# Patient Record
Sex: Male | Born: 1935 | Race: White | Hispanic: No | Marital: Married | Smoking: Never smoker
Health system: Southern US, Community
[De-identification: ages and names within clinical notes are randomized; demographics above are authoritative.]

## PROBLEM LIST (undated history)

## (undated) DIAGNOSIS — E291 Testicular hypofunction: Secondary | ICD-10-CM

## (undated) DIAGNOSIS — M545 Low back pain, unspecified: Secondary | ICD-10-CM

## (undated) DIAGNOSIS — M5030 Other cervical disc degeneration, unspecified cervical region: Secondary | ICD-10-CM

## (undated) DIAGNOSIS — N319 Neuromuscular dysfunction of bladder, unspecified: Secondary | ICD-10-CM

## (undated) DIAGNOSIS — D649 Anemia, unspecified: Secondary | ICD-10-CM

## (undated) DIAGNOSIS — R3915 Urgency of urination: Secondary | ICD-10-CM

## (undated) DIAGNOSIS — M4850XA Collapsed vertebra, not elsewhere classified, site unspecified, initial encounter for fracture: Secondary | ICD-10-CM

## (undated) DIAGNOSIS — K219 Gastro-esophageal reflux disease without esophagitis: Secondary | ICD-10-CM

## (undated) DIAGNOSIS — M21332 Wrist drop, left wrist: Secondary | ICD-10-CM

## (undated) DIAGNOSIS — M159 Polyosteoarthritis, unspecified: Secondary | ICD-10-CM

## (undated) DIAGNOSIS — M51369 Other intervertebral disc degeneration, lumbar region without mention of lumbar back pain or lower extremity pain: Secondary | ICD-10-CM

## (undated) DIAGNOSIS — R569 Unspecified convulsions: Secondary | ICD-10-CM

## (undated) DIAGNOSIS — I878 Other specified disorders of veins: Secondary | ICD-10-CM

## (undated) DIAGNOSIS — R413 Other amnesia: Secondary | ICD-10-CM

## (undated) DIAGNOSIS — M4800 Spinal stenosis, site unspecified: Secondary | ICD-10-CM

## (undated) DIAGNOSIS — F32A Depression, unspecified: Secondary | ICD-10-CM

## (undated) DIAGNOSIS — M5136 Other intervertebral disc degeneration, lumbar region: Secondary | ICD-10-CM

## (undated) DIAGNOSIS — Z7409 Other reduced mobility: Secondary | ICD-10-CM

## (undated) DIAGNOSIS — F329 Major depressive disorder, single episode, unspecified: Secondary | ICD-10-CM

## (undated) DIAGNOSIS — H269 Unspecified cataract: Secondary | ICD-10-CM

## (undated) DIAGNOSIS — R109 Unspecified abdominal pain: Secondary | ICD-10-CM

## (undated) DIAGNOSIS — H539 Unspecified visual disturbance: Secondary | ICD-10-CM

## (undated) DIAGNOSIS — M810 Age-related osteoporosis without current pathological fracture: Secondary | ICD-10-CM

## (undated) DIAGNOSIS — G3184 Mild cognitive impairment, so stated: Secondary | ICD-10-CM

## (undated) DIAGNOSIS — Z789 Other specified health status: Secondary | ICD-10-CM

## (undated) DIAGNOSIS — G5630 Lesion of radial nerve, unspecified upper limb: Secondary | ICD-10-CM

## (undated) DIAGNOSIS — Z974 Presence of external hearing-aid: Secondary | ICD-10-CM

## (undated) DIAGNOSIS — M4310 Spondylolisthesis, site unspecified: Secondary | ICD-10-CM

## (undated) DIAGNOSIS — M419 Scoliosis, unspecified: Secondary | ICD-10-CM

## (undated) DIAGNOSIS — M21331 Wrist drop, right wrist: Secondary | ICD-10-CM

## (undated) DIAGNOSIS — G629 Polyneuropathy, unspecified: Secondary | ICD-10-CM

## (undated) DIAGNOSIS — I10 Essential (primary) hypertension: Secondary | ICD-10-CM

## (undated) DIAGNOSIS — N393 Stress incontinence (female) (male): Secondary | ICD-10-CM

## (undated) DIAGNOSIS — F09 Unspecified mental disorder due to known physiological condition: Secondary | ICD-10-CM

## (undated) DIAGNOSIS — Z9689 Presence of other specified functional implants: Secondary | ICD-10-CM

## (undated) DIAGNOSIS — K449 Diaphragmatic hernia without obstruction or gangrene: Secondary | ICD-10-CM

## (undated) DIAGNOSIS — M47812 Spondylosis without myelopathy or radiculopathy, cervical region: Secondary | ICD-10-CM

## (undated) DIAGNOSIS — Z993 Dependence on wheelchair: Secondary | ICD-10-CM

## (undated) DIAGNOSIS — M2030 Hallux varus (acquired), unspecified foot: Secondary | ICD-10-CM

## (undated) DIAGNOSIS — E538 Deficiency of other specified B group vitamins: Secondary | ICD-10-CM

## (undated) DIAGNOSIS — N400 Enlarged prostate without lower urinary tract symptoms: Secondary | ICD-10-CM

## (undated) DIAGNOSIS — E785 Hyperlipidemia, unspecified: Secondary | ICD-10-CM

## (undated) DIAGNOSIS — M47817 Spondylosis without myelopathy or radiculopathy, lumbosacral region: Secondary | ICD-10-CM

## (undated) DIAGNOSIS — R269 Unspecified abnormalities of gait and mobility: Secondary | ICD-10-CM

## (undated) DIAGNOSIS — I872 Venous insufficiency (chronic) (peripheral): Secondary | ICD-10-CM

## (undated) DIAGNOSIS — G4700 Insomnia, unspecified: Secondary | ICD-10-CM

## (undated) DIAGNOSIS — G4730 Sleep apnea, unspecified: Secondary | ICD-10-CM

## (undated) DIAGNOSIS — M1990 Unspecified osteoarthritis, unspecified site: Secondary | ICD-10-CM

## (undated) DIAGNOSIS — G4719 Other hypersomnia: Secondary | ICD-10-CM

## (undated) HISTORY — DX: Testicular hypofunction: E29.1

## (undated) HISTORY — DX: Unspecified osteoarthritis, unspecified site: M19.90

## (undated) HISTORY — DX: Urgency of urination: R39.15

## (undated) HISTORY — DX: Anemia, unspecified: D64.9

## (undated) HISTORY — PX: PAIN PUMP IMPLANTATION: SHX330

## (undated) HISTORY — DX: Other intervertebral disc degeneration, lumbar region without mention of lumbar back pain or lower extremity pain: M51.369

## (undated) HISTORY — PX: JOINT REPLACEMENT: SHX530

## (undated) HISTORY — DX: Other amnesia: R41.3

## (undated) HISTORY — DX: Other intervertebral disc degeneration, lumbar region: M51.36

## (undated) HISTORY — DX: Deficiency of other specified B group vitamins: E53.8

## (undated) HISTORY — DX: Polyneuropathy, unspecified: G62.9

## (undated) HISTORY — DX: Unspecified cataract: H26.9

## (undated) HISTORY — DX: Presence of external hearing-aid: Z97.4

## (undated) HISTORY — DX: Insomnia, unspecified: G47.00

## (undated) HISTORY — DX: Spinal stenosis, site unspecified: M48.00

## (undated) HISTORY — DX: Lesion of radial nerve, unspecified upper limb: G56.30

## (undated) HISTORY — DX: Major depressive disorder, single episode, unspecified: F32.9

## (undated) HISTORY — DX: Other reduced mobility: Z74.09

## (undated) HISTORY — DX: Benign prostatic hyperplasia without lower urinary tract symptoms: N40.0

## (undated) HISTORY — PX: CERVICAL SPINE SURGERY: SHX589

## (undated) HISTORY — DX: Other specified health status: Z78.9

## (undated) HISTORY — DX: Diaphragmatic hernia without obstruction or gangrene: K44.9

## (undated) HISTORY — DX: Other hypersomnia: G47.19

## (undated) HISTORY — DX: Age-related osteoporosis without current pathological fracture: M81.0

## (undated) HISTORY — DX: Unspecified abdominal pain: R10.9

## (undated) HISTORY — DX: Unspecified mental disorder due to known physiological condition: F09

## (undated) HISTORY — PX: LUMBAR SPINE SURGERY: SHX701

## (undated) HISTORY — DX: Other cervical disc degeneration, unspecified cervical region: M50.30

## (undated) HISTORY — DX: Hallux varus (acquired), unspecified foot: M20.30

## (undated) HISTORY — PX: ROTATOR CUFF REPAIR: SHX139

## (undated) HISTORY — DX: Polyosteoarthritis, unspecified: M15.9

## (undated) HISTORY — DX: Sleep apnea, unspecified: G47.30

## (undated) HISTORY — DX: Scoliosis, unspecified: M41.9

## (undated) HISTORY — DX: Spondylolisthesis, site unspecified: M43.10

## (undated) HISTORY — DX: Other specified disorders of veins: I87.8

## (undated) HISTORY — DX: Essential (primary) hypertension: I10

## (undated) HISTORY — DX: Spondylosis without myelopathy or radiculopathy, cervical region: M47.812

## (undated) HISTORY — PX: THORACIC SPINE SURGERY: SHX802

## (undated) HISTORY — DX: Unspecified abnormalities of gait and mobility: R26.9

## (undated) HISTORY — DX: Low back pain, unspecified: M54.50

## (undated) HISTORY — PX: SPINAL CORD STIMULATOR IMPLANT: SHX2422

## (undated) HISTORY — DX: Gastro-esophageal reflux disease without esophagitis: K21.9

## (undated) HISTORY — DX: Neuromuscular dysfunction of bladder, unspecified: N31.9

## (undated) HISTORY — DX: Collapsed vertebra, not elsewhere classified, site unspecified, initial encounter for fracture: M48.50XA

## (undated) HISTORY — DX: Unspecified visual disturbance: H53.9

## (undated) HISTORY — DX: Low back pain: M54.5

## (undated) HISTORY — DX: Venous insufficiency (chronic) (peripheral): I87.2

## (undated) HISTORY — PX: HEMORRHOID SURGERY: SHX153

## (undated) HISTORY — DX: Wrist drop, right wrist: M21.331

## (undated) HISTORY — PX: HERNIA REPAIR: SHX51

## (undated) HISTORY — DX: Wrist drop, left wrist: M21.332

## (undated) HISTORY — DX: Stress incontinence (female) (male): N39.3

## (undated) HISTORY — PX: TONSILLECTOMY: SUR1361

## (undated) HISTORY — DX: Mild cognitive impairment of uncertain or unknown etiology: G31.84

## (undated) HISTORY — DX: Spondylosis without myelopathy or radiculopathy, lumbosacral region: M47.817

## (undated) HISTORY — DX: Hyperlipidemia, unspecified: E78.5

## (undated) HISTORY — DX: Unspecified convulsions: R56.9

## (undated) HISTORY — PX: PAIN PUMP REMOVAL: SHX6391

## (undated) HISTORY — DX: Depression, unspecified: F32.A

---
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
## 2004-01-16 HISTORY — PX: OTHER SURGICAL HISTORY: SHX169

## 2004-03-15 DIAGNOSIS — M810 Age-related osteoporosis without current pathological fracture: Secondary | ICD-10-CM

## 2004-03-15 HISTORY — DX: Age-related osteoporosis without current pathological fracture: M81.0

## 2006-09-20 ENCOUNTER — Encounter: Admission: RE | Admit: 2006-09-20 | Discharge: 2006-09-20 | Payer: Self-pay | Admitting: Orthopedic Surgery

## 2010-08-07 DIAGNOSIS — K5903 Drug induced constipation: Secondary | ICD-10-CM | POA: Insufficient documentation

## 2010-08-07 DIAGNOSIS — M1990 Unspecified osteoarthritis, unspecified site: Secondary | ICD-10-CM | POA: Insufficient documentation

## 2010-08-21 DIAGNOSIS — K432 Incisional hernia without obstruction or gangrene: Secondary | ICD-10-CM | POA: Insufficient documentation

## 2010-08-21 DIAGNOSIS — Q792 Exomphalos: Secondary | ICD-10-CM | POA: Insufficient documentation

## 2011-03-29 DIAGNOSIS — G8929 Other chronic pain: Secondary | ICD-10-CM | POA: Insufficient documentation

## 2011-03-29 DIAGNOSIS — N400 Enlarged prostate without lower urinary tract symptoms: Secondary | ICD-10-CM | POA: Insufficient documentation

## 2011-03-29 DIAGNOSIS — R3915 Urgency of urination: Secondary | ICD-10-CM | POA: Insufficient documentation

## 2011-05-03 DIAGNOSIS — M545 Low back pain, unspecified: Secondary | ICD-10-CM | POA: Insufficient documentation

## 2011-05-03 DIAGNOSIS — M5137 Other intervertebral disc degeneration, lumbosacral region: Secondary | ICD-10-CM | POA: Insufficient documentation

## 2011-05-03 DIAGNOSIS — M47817 Spondylosis without myelopathy or radiculopathy, lumbosacral region: Secondary | ICD-10-CM | POA: Insufficient documentation

## 2011-05-03 DIAGNOSIS — M40209 Unspecified kyphosis, site unspecified: Secondary | ICD-10-CM | POA: Insufficient documentation

## 2011-05-03 DIAGNOSIS — M47816 Spondylosis without myelopathy or radiculopathy, lumbar region: Secondary | ICD-10-CM | POA: Insufficient documentation

## 2011-05-03 DIAGNOSIS — M47812 Spondylosis without myelopathy or radiculopathy, cervical region: Secondary | ICD-10-CM | POA: Insufficient documentation

## 2011-08-21 DIAGNOSIS — R350 Frequency of micturition: Secondary | ICD-10-CM | POA: Insufficient documentation

## 2012-05-05 DIAGNOSIS — M961 Postlaminectomy syndrome, not elsewhere classified: Secondary | ICD-10-CM | POA: Insufficient documentation

## 2012-10-10 DIAGNOSIS — M439 Deforming dorsopathy, unspecified: Secondary | ICD-10-CM | POA: Insufficient documentation

## 2013-06-05 DIAGNOSIS — Z9989 Dependence on other enabling machines and devices: Secondary | ICD-10-CM | POA: Insufficient documentation

## 2013-06-05 DIAGNOSIS — R230 Cyanosis: Secondary | ICD-10-CM | POA: Insufficient documentation

## 2013-06-05 DIAGNOSIS — R296 Repeated falls: Secondary | ICD-10-CM | POA: Insufficient documentation

## 2013-06-29 DIAGNOSIS — G8929 Other chronic pain: Secondary | ICD-10-CM | POA: Insufficient documentation

## 2013-06-29 DIAGNOSIS — M5030 Other cervical disc degeneration, unspecified cervical region: Secondary | ICD-10-CM

## 2013-06-29 DIAGNOSIS — I872 Venous insufficiency (chronic) (peripheral): Secondary | ICD-10-CM | POA: Insufficient documentation

## 2013-06-29 DIAGNOSIS — M539 Dorsopathy, unspecified: Secondary | ICD-10-CM | POA: Insufficient documentation

## 2013-06-29 DIAGNOSIS — M419 Scoliosis, unspecified: Secondary | ICD-10-CM

## 2013-06-29 DIAGNOSIS — G4700 Insomnia, unspecified: Secondary | ICD-10-CM

## 2013-06-29 HISTORY — DX: Insomnia, unspecified: G47.00

## 2013-06-29 HISTORY — DX: Venous insufficiency (chronic) (peripheral): I87.2

## 2013-06-29 HISTORY — DX: Scoliosis, unspecified: M41.9

## 2013-06-29 HISTORY — DX: Other cervical disc degeneration, unspecified cervical region: M50.30

## 2013-07-30 DIAGNOSIS — R946 Abnormal results of thyroid function studies: Secondary | ICD-10-CM | POA: Insufficient documentation

## 2013-07-30 DIAGNOSIS — M810 Age-related osteoporosis without current pathological fracture: Secondary | ICD-10-CM | POA: Insufficient documentation

## 2013-07-30 DIAGNOSIS — R400 Somnolence: Secondary | ICD-10-CM | POA: Insufficient documentation

## 2013-07-30 DIAGNOSIS — Z13220 Encounter for screening for lipoid disorders: Secondary | ICD-10-CM | POA: Insufficient documentation

## 2013-08-20 DIAGNOSIS — Z79899 Other long term (current) drug therapy: Secondary | ICD-10-CM | POA: Insufficient documentation

## 2013-08-20 DIAGNOSIS — M4120 Other idiopathic scoliosis, site unspecified: Secondary | ICD-10-CM | POA: Insufficient documentation

## 2013-09-04 DIAGNOSIS — M79606 Pain in leg, unspecified: Secondary | ICD-10-CM | POA: Insufficient documentation

## 2013-11-19 DIAGNOSIS — M159 Polyosteoarthritis, unspecified: Secondary | ICD-10-CM | POA: Insufficient documentation

## 2013-11-19 DIAGNOSIS — R413 Other amnesia: Secondary | ICD-10-CM | POA: Insufficient documentation

## 2013-11-19 HISTORY — DX: Polyosteoarthritis, unspecified: M15.9

## 2013-12-08 DIAGNOSIS — G4733 Obstructive sleep apnea (adult) (pediatric): Secondary | ICD-10-CM | POA: Insufficient documentation

## 2013-12-08 DIAGNOSIS — F1996 Other psychoactive substance use, unspecified with psychoactive substance-induced persisting amnestic disorder: Secondary | ICD-10-CM | POA: Insufficient documentation

## 2014-04-09 DIAGNOSIS — R252 Cramp and spasm: Secondary | ICD-10-CM | POA: Insufficient documentation

## 2014-04-22 ENCOUNTER — Ambulatory Visit: Admit: 2014-04-22 | Disposition: A | Discharge status: Home / Self Care | Payer: Self-pay

## 2014-04-28 HISTORY — PX: INTRATHECAL PUMP IMPLANTATION: SHX1844

## 2014-06-03 ENCOUNTER — Other Ambulatory Visit
Admission: RE | Admit: 2014-06-03 | Discharge: 2014-06-03 | Disposition: A | Discharge status: Home / Self Care | Payer: Medicare Other | Source: Ambulatory Visit | Attending: Podiatry | Admitting: Podiatry

## 2014-06-03 DIAGNOSIS — R200 Anesthesia of skin: Secondary | ICD-10-CM | POA: Insufficient documentation

## 2014-06-03 LAB — TROPONIN I: Troponin I: <0.03 ng/mL (ref <0.031)

## 2014-06-07 ENCOUNTER — Other Ambulatory Visit: Payer: Self-pay | Admitting: Neurology

## 2014-06-07 DIAGNOSIS — R200 Anesthesia of skin: Secondary | ICD-10-CM

## 2014-06-15 ENCOUNTER — Ambulatory Visit: Payer: Medicare Other

## 2014-06-21 ENCOUNTER — Other Ambulatory Visit: Payer: Self-pay | Admitting: Neurology

## 2014-06-21 DIAGNOSIS — R200 Anesthesia of skin: Secondary | ICD-10-CM

## 2014-06-29 ENCOUNTER — Ambulatory Visit: Payer: Medicare Other

## 2014-06-30 ENCOUNTER — Ambulatory Visit
Admission: RE | Admit: 2014-06-30 | Discharge: 2014-06-30 | Disposition: A | Discharge status: Home / Self Care | Payer: Medicare Other | Source: Ambulatory Visit | Attending: Neurology | Admitting: Neurology

## 2014-06-30 ENCOUNTER — Ambulatory Visit: Admission: RE | Admit: 2014-06-30 | Payer: Medicare Other | Source: Ambulatory Visit

## 2014-06-30 DIAGNOSIS — R200 Anesthesia of skin: Secondary | ICD-10-CM | POA: Diagnosis not present

## 2014-07-12 ENCOUNTER — Other Ambulatory Visit: Payer: Self-pay | Admitting: Family Medicine

## 2014-07-12 DIAGNOSIS — W19XXXA Unspecified fall, initial encounter: Secondary | ICD-10-CM

## 2014-07-12 DIAGNOSIS — R200 Anesthesia of skin: Secondary | ICD-10-CM

## 2014-07-13 ENCOUNTER — Ambulatory Visit
Admission: RE | Admit: 2014-07-13 | Discharge: 2014-07-13 | Disposition: A | Discharge status: Home / Self Care | Payer: Medicare Other | Source: Ambulatory Visit | Attending: Family Medicine | Admitting: Family Medicine

## 2014-07-13 DIAGNOSIS — R208 Other disturbances of skin sensation: Secondary | ICD-10-CM | POA: Insufficient documentation

## 2014-07-13 DIAGNOSIS — H539 Unspecified visual disturbance: Secondary | ICD-10-CM | POA: Diagnosis not present

## 2014-07-13 DIAGNOSIS — R200 Anesthesia of skin: Secondary | ICD-10-CM

## 2014-07-13 DIAGNOSIS — I10 Essential (primary) hypertension: Secondary | ICD-10-CM | POA: Insufficient documentation

## 2014-07-13 DIAGNOSIS — W19XXXA Unspecified fall, initial encounter: Secondary | ICD-10-CM

## 2014-07-20 ENCOUNTER — Ambulatory Visit: Payer: Self-pay | Admitting: Urology

## 2014-07-23 ENCOUNTER — Ambulatory Visit (INDEPENDENT_AMBULATORY_CARE_PROVIDER_SITE_OTHER): Payer: Medicare Other | Admitting: Urology

## 2014-07-23 ENCOUNTER — Encounter: Payer: Self-pay | Admitting: Urology

## 2014-07-23 VITALS — BP 154/87 | HR 71 | Resp 18 | Ht 66.0 in | Wt 169.6 lb

## 2014-07-23 DIAGNOSIS — R351 Nocturia: Secondary | ICD-10-CM | POA: Diagnosis not present

## 2014-07-23 DIAGNOSIS — M419 Scoliosis, unspecified: Secondary | ICD-10-CM | POA: Insufficient documentation

## 2014-07-23 DIAGNOSIS — R269 Unspecified abnormalities of gait and mobility: Secondary | ICD-10-CM | POA: Insufficient documentation

## 2014-07-23 DIAGNOSIS — E291 Testicular hypofunction: Secondary | ICD-10-CM

## 2014-07-23 DIAGNOSIS — G9340 Encephalopathy, unspecified: Secondary | ICD-10-CM | POA: Insufficient documentation

## 2014-07-23 DIAGNOSIS — M9979 Connective tissue and disc stenosis of intervertebral foramina of abdomen and other regions: Secondary | ICD-10-CM | POA: Insufficient documentation

## 2014-07-23 DIAGNOSIS — M4850XA Collapsed vertebra, not elsewhere classified, site unspecified, initial encounter for fracture: Secondary | ICD-10-CM | POA: Insufficient documentation

## 2014-07-23 DIAGNOSIS — E785 Hyperlipidemia, unspecified: Secondary | ICD-10-CM | POA: Insufficient documentation

## 2014-07-23 DIAGNOSIS — K219 Gastro-esophageal reflux disease without esophagitis: Secondary | ICD-10-CM | POA: Insufficient documentation

## 2014-07-23 DIAGNOSIS — M4800 Spinal stenosis, site unspecified: Secondary | ICD-10-CM | POA: Insufficient documentation

## 2014-07-23 DIAGNOSIS — R413 Other amnesia: Secondary | ICD-10-CM | POA: Insufficient documentation

## 2014-07-23 DIAGNOSIS — D649 Anemia, unspecified: Secondary | ICD-10-CM | POA: Insufficient documentation

## 2014-07-23 DIAGNOSIS — F418 Other specified anxiety disorders: Secondary | ICD-10-CM | POA: Insufficient documentation

## 2014-07-23 DIAGNOSIS — N319 Neuromuscular dysfunction of bladder, unspecified: Secondary | ICD-10-CM | POA: Insufficient documentation

## 2014-07-23 DIAGNOSIS — I10 Essential (primary) hypertension: Secondary | ICD-10-CM | POA: Insufficient documentation

## 2014-07-23 DIAGNOSIS — K469 Unspecified abdominal hernia without obstruction or gangrene: Secondary | ICD-10-CM | POA: Insufficient documentation

## 2014-07-23 DIAGNOSIS — R109 Unspecified abdominal pain: Secondary | ICD-10-CM | POA: Insufficient documentation

## 2014-07-23 LAB — URINALYSIS, COMPLETE
Bilirubin, UA: NEGATIVE
Glucose, UA: NEGATIVE
Ketones, UA: NEGATIVE
LEUKOCYTES UA: NEGATIVE
Nitrite, UA: NEGATIVE
PH UA: 7.5 (ref 5.0–7.5)
PROTEIN UA: NEGATIVE
Specific Gravity, UA: 1.015 (ref 1.005–1.030)
Urobilinogen, Ur: 0.2 mg/dL (ref 0.2–1.0)

## 2014-07-23 LAB — BLADDER SCAN AMB NON-IMAGING

## 2014-07-23 LAB — MICROSCOPIC EXAMINATION: BACTERIA UA: NONE SEEN

## 2014-07-23 NOTE — Progress Notes (Signed)
07/23/2014 3:32 PM   Sherre Lain 08-28-1935 [id number]  Referring provider: No referring provider defined for this encounter.  Chief Complaint  Patient presents with  . Follow-up    6 mo. f/u  . Hypogonadism  . Nocturia    HPI: Patient with problems of incontinence and retention in combination. He has a Dilaudid pump and complicated by neurogenic bladder, carried by his multiple spinal column problems. Recent bowels between his incontinence is retention and we find find that my Bechert 50 mg every other day works really well for this patient. He is very pleased with his activity. His bladder diary is vastly improved with very few incontinent events over 6 week. An the incontinence is of a leak to pad O damp only. Follow-up is in 4 months.    PMH: No past medical history on file.  Surgical History: No past surgical history on file.  Home Medications:    Medication List    Notice  As of 07/23/2014  3:32 PM   You have not been prescribed any medications.      Allergies:  Allergies  Allergen Reactions  . Amitriptyline   . Nortriptyline Other (See Comments)    GI  upset    Family History: No family history on file.  Social History:  reports that he has never smoked. He does not have any smokeless tobacco history on file. He reports that he does not drink alcohol. His drug history is not on file.  ROS: Urological Symptom Review       Review of Systems UROLOGY Frequent Urination?: Yes Hard to postpone urination?: Yes Burning/pain with urination?: No Get up at night to urinate?: Yes Leakage of urine?: No Urine stream starts and stops?: No Trouble starting stream?: Yes Do you have to strain to urinate?: No Blood in urine?: No Urinary tract infection?: No Sexually transmitted disease?: No Injury to kidneys or bladder?: No Painful intercourse?: No Weak stream?: No Erection problems?: Yes Penile pain?: No Gastrointestinal Nausea?: No Vomiting?:  No Indigestion/heartburn?: Yes Diarrhea?: No Constipation?: Yes Constitutional Fever: No Night sweats?: No Weight loss?: No Fatigue?: No Skin Skin rash/lesions?: No Itching?: No Eyes Blurred vision?: Yes Double vision?: No Ears/Nose/Throat Sore throat?: No Sinus problems?: No Hematologic/Lymphatic Swollen glands?: No Easy bruising?: Yes Cardiovascular Leg swelling?: Yes Chest pain?: No Respiratory Cough?: No Shortness of breath?: No Endocrine Excessive thirst?: No Musculoskeletal Back pain?: Yes Joint pain?: Yes Neurological Headaches?: No Dizziness?: No Psychologic Depression?: No Anxiety?: No   Physical Exam: BP 154/87 mmHg  Pulse 71  Resp 18  Ht  (1.676 m)  Wt 169 lb 9.6 oz (76.93 kg)  BMI 27.39 kg/m2  Constitutional:  Alert and oriented, No acute distress. HEENT: [place] AT, moist mucus membranes.  Trachea midline, no masses. Cardiovascular: No clubbing, cyanosis, or edema. Respiratory: Normal respiratory effort, no increased work of breathing. GI: Abdomen is soft, nontender, nondistended, no abdominal masses GU: No CVA tenderness. No suprapubic distention. Skin: No rashes, bruises or suspicious lesions. Lymph: No cervical or inguinal adenopathy. Neurologic: Grossly intact, no focal deficits, moving all 4 extremities. Psychiatric: Normal mood and affect.  Laboratory Data: No results found for: WBC, HGB, HCT, MCV, PLT  No results found for: CREATININE  No results found for: PSA  No results found for: TESTOSTERONE  No results found for: HGBA1C  Urinalysis No results found for: COLORURINE, APPEARANCEUR, LABSPEC, PHURINE, GLUCOSEU, HGBUR, BILIRUBINUR, KETONESUR, PROTEINUR, UROBILINOGEN, NITRITE, LEUKOCYTESUR  Pertinent Imaging: PVR 80 AsMaintain single delighted pump.sessment &  Plan:  Remain on my Bechert 50 mg every other day as postvoid residuals only 80. Incontinent events are very uncommon. Flow is good.  1. Nocturia   2. Hypogonadism  in male  - Urinalysis, Complete   No Follow-up on file.  Lorraine Laxichard D Hart, MD  [facility] [address], [address] [place], [place] [address] [phone]

## 2014-09-08 HISTORY — PX: OTHER SURGICAL HISTORY: SHX169

## 2014-11-24 ENCOUNTER — Telehealth: Payer: Self-pay

## 2014-11-24 NOTE — Telephone Encounter (Signed)
Pt pharmacy sent a refill request for androgel. Please advise.

## 2014-11-25 NOTE — Telephone Encounter (Signed)
I know he was just seen by Dr. Edwyna ShellHart, but he needs blood work before his testosterone is refilled.

## 2014-11-26 NOTE — Telephone Encounter (Signed)
Spoke with pt in reference to testosterone script. Pt stated he sees and endocrinologist and she has already given him a script. Pt states he will just f/u with her for labs and testosterone scripts.

## 2015-04-11 ENCOUNTER — Encounter: Payer: Self-pay | Admitting: Urology

## 2015-04-11 ENCOUNTER — Ambulatory Visit (INDEPENDENT_AMBULATORY_CARE_PROVIDER_SITE_OTHER): Payer: Medicare Other | Admitting: Urology

## 2015-04-11 VITALS — BP 155/72 | HR 69 | Ht 66.0 in | Wt 177.6 lb

## 2015-04-11 DIAGNOSIS — N138 Other obstructive and reflux uropathy: Secondary | ICD-10-CM

## 2015-04-11 DIAGNOSIS — N401 Enlarged prostate with lower urinary tract symptoms: Secondary | ICD-10-CM | POA: Diagnosis not present

## 2015-04-11 DIAGNOSIS — R350 Frequency of micturition: Secondary | ICD-10-CM | POA: Diagnosis not present

## 2015-04-11 LAB — URINALYSIS, COMPLETE
Bilirubin, UA: NEGATIVE
Glucose, UA: NEGATIVE
Ketones, UA: NEGATIVE
LEUKOCYTES UA: NEGATIVE
NITRITE UA: NEGATIVE
Protein, UA: NEGATIVE
RBC, UA: NEGATIVE
SPEC GRAV UA: 1.015 (ref 1.005–1.030)
Urobilinogen, Ur: 0.2 mg/dL (ref 0.2–1.0)
pH, UA: 7 (ref 5.0–7.5)

## 2015-04-11 LAB — MICROSCOPIC EXAMINATION
Epithelial Cells (non renal): NONE SEEN /hpf (ref 0–10)
RBC MICROSCOPIC, UA: NONE SEEN /HPF (ref 0–?)

## 2015-04-11 LAB — BLADDER SCAN AMB NON-IMAGING: Scan Result: 22

## 2015-04-11 MED ORDER — MIRABEGRON ER 50 MG PO TB24: 50.0000 mg | ORAL_TABLET | Freq: Every day | ORAL | Status: DC

## 2015-04-11 NOTE — Progress Notes (Signed)
04/11/2015 3:39 PM   Dustin Turner [birth year] [id number]  Referring provider: Berneice Gandy, MD [address] [place], [place] [address]  Chief Complaint  Patient presents with  . Urinary Frequency    HPI: Patient is 80 year old Caucasian male with a history of hypogonadism managed by his endocrinologist, BPH with LUTS and frequency who presents today with a worsening of his urinary frequency.    Hypogonadism Patient is on AndroGel for his hypogonadism.  His hypogonadism is being managed by his endocrinologist.  BPH WITH LUTS His IPSS score today is 18, which is moderate lower urinary tract symptomatology. He is mostly dissatisfied with his quality life due to his urinary symptoms. His PVR is 22 mL.  His major complaint today is an increase in his nocturia.  He states he has been getting up 3-5 times to urinate.  He has had these symptoms for over one month.  He denies any dysuria, hematuria or suprapubic pain.   He currently taking Myrbetriq 50 mg every other day.  He also denies any recent fevers, chills, nausea or vomiting.  He does not have a family history of PCa.      IPSS      04/11/15 1500       International Prostate Symptom Score   How often have you had the sensation of not emptying your bladder? Almost always     How often have you had to urinate less than every two hours? About half the time     How often have you found you stopped and started again several times when you urinated? Less than 1 in 5 times     How often have you found it difficult to postpone urination? More than half the time     How often have you had a weak urinary stream? Not at All     How often have you had to strain to start urination? Less than 1 in 5 times     How many times did you typically get up at night to urinate? 4 Times     Total IPSS Score 18     Quality of Life due to urinary symptoms   If you were to spend the rest of your life with your urinary condition just the way it  is now how would you feel about that? Mostly Disatisfied        Score:  1-7 Mild 8-19 Moderate 20-35 Severe  Frequency Patient noted an increase in day and night time urination.  This has been occuring over the last month.  He does have OSA and sleeps with the CPAP machine.  He is taking the Myrbetriq 50 mg every other day.  He is wanting to increase the medication to a daily course.  His PVR on today's exam was 22 mL.  His UA today was unremarkable.     PMH: Past Medical History  Diagnosis Date  . Scoliosis   . BPH (benign prostatic hyperplasia)   . Hypogonadism in male   . Urinary urgency   . Neurogenic bladder   . Arthritis   . Spinal stenosis   . Insomnia   . Venous stasis   . OP (osteoporosis)   . DDD (degenerative disc disease), cervical     Surgical History: Past Surgical History  Procedure Laterality Date  . Hernia repair    . Thoracic spine surgery    . Joint replacement    . Rotator cuff repair    .  Cervical spine surgery    . Lumbar spine surgery    . Proleive system treatment  2006    Home Medications:    Medication List       This list is accurate as of: 04/11/15  3:39 PM.  Always use your most recent med list.               alendronate 70 MG tablet  Commonly known as:  FOSAMAX     ammonium lactate 12 % lotion  Commonly known as:  LAC-HYDRIN  Apply topically.     ARICEPT 10 MG tablet  Generic drug:  donepezil  10 mg.     CALCIUM 500/D 500-200 MG-UNIT tablet  Generic drug:  calcium-vitamin D  Take by mouth.     ciprofloxacin 0.3 % ophthalmic solution  Commonly known as:  CILOXAN  Reported on 04/11/2015     D 400 400 units Chew  Generic drug:  Cholecalciferol  Chew by mouth. Reported on 04/11/2015     erythromycin ophthalmic ointment  Reported on 04/11/2015     Eszopiclone 3 MG Tabs  Reported on 04/11/2015     fentaNYL 800 MCG lollipop  Commonly known as:  ACTIQ  Reported on 04/11/2015     FISH OIL + D3 1000-1000 MG-UNIT Caps    Take by mouth.     gabapentin 400 MG capsule  Commonly known as:  NEURONTIN  Take 400 mg by mouth.     HYDROcodone-acetaminophen 10-325 MG tablet  Commonly known as:  NORCO  Take by mouth.     HYDROmorphone (PF) 250 MG injection  Commonly known as:  DILAUDID HP  Inject 10 mg as directed continuously.     ibuprofen 800 MG tablet  Commonly known as:  ADVIL,MOTRIN  Take 800 mg by mouth.     LINZESS 290 MCG Caps capsule  Generic drug:  Linaclotide     LORZONE 750 MG Tabs  Generic drug:  Chlorzoxazone  Take by mouth. Reported on 04/11/2015     magnesium hydroxide 400 MG/5ML suspension  Commonly known as:  MILK OF MAGNESIA  Take by mouth. Reported on 04/11/2015     memantine 10 MG tablet  Commonly known as:  NAMENDA  Take by mouth.     METANX 3-90.314-2-35 MG Caps  Reported on 04/11/2015     methylphenidate 10 MG tablet  Commonly known as:  RITALIN  Take by mouth.     mexiletine 200 MG capsule  Commonly known as:  MEXITIL  Reported on 04/11/2015     mirabegron ER 50 MG Tb24 tablet  Commonly known as:  MYRBETRIQ  Take 1 tablet (50 mg total) by mouth daily.     MOVANTIK 12.5 MG Tabs tablet  Generic drug:  naloxegol oxalate     NAPROXEN DR 500 MG EC tablet  Generic drug:  naproxen  Take by mouth.     NARCAN 4 MG/0.1ML Liqd  Generic drug:  Naloxone HCl  Reported on 04/11/2015     oxyCODONE 40 mg 12 hr tablet  Commonly known as:  OXYCONTIN  Take by mouth. Reported on 04/11/2015     PEN NEEDLES 31GX5/16" 31G X 8 MM Misc  1 each by Does not apply route.     RESTASIS 0.05 % ophthalmic emulsion  Generic drug:  cycloSPORINE     SUPER BIOTIN 5 MG Tabs  Generic drug:  Biotin  10,000 mg.     Testosterone 20.25 MG/ACT (1.62%) Gel  Place onto the skin.  triamcinolone cream 0.1 %  Commonly known as:  KENALOG  apply to affected area twice a day UNTIL IMPROVED     vitamin B-12 1000 MCG tablet  Commonly known as:  CYANOCOBALAMIN  Take by mouth.     vitamin C  1000 MG tablet  Take 460 mg by mouth.     VOLTAREN 1 % Gel  Generic drug:  diclofenac sodium  Apply topically.        Allergies:  Allergies  Allergen Reactions  . Amitriptyline   . Nortriptyline Other (See Comments)    GI  upset    Family History: Family History  Problem Relation Age of Onset  . Diabetes Father   . Diabetes Brother   . Stroke Mother   . Stroke Father   . Alcohol abuse Father   . Alcohol abuse Brother   . Kidney disease Neg Hx   . Prostate cancer Neg Hx     Social History:  reports that he has never smoked. He does not have any smokeless tobacco history on file. He reports that he does not drink alcohol or use illicit drugs.  ROS: UROLOGY Frequent Urination?: Yes Hard to postpone urination?: No Burning/pain with urination?: No Get up at night to urinate?: Yes Leakage of urine?: No Urine stream starts and stops?: No Trouble starting stream?: No Do you have to strain to urinate?: No Blood in urine?: No Urinary tract infection?: No Sexually transmitted disease?: No Injury to kidneys or bladder?: No Painful intercourse?: No Weak stream?: No Erection problems?: Yes Penile pain?: No  Gastrointestinal Nausea?: No Vomiting?: No Indigestion/heartburn?: Yes Diarrhea?: No Constipation?: Yes  Constitutional Fever: No Night sweats?: No Weight loss?: No Fatigue?: No  Skin Skin rash/lesions?: No Itching?: No  Eyes Blurred vision?: No Double vision?: No  Ears/Nose/Throat Sore throat?: No Sinus problems?: No  Hematologic/Lymphatic Swollen glands?: No Easy bruising?: Yes  Cardiovascular Leg swelling?: No Chest pain?: No  Respiratory Cough?: No Shortness of breath?: No  Endocrine Excessive thirst?: No  Musculoskeletal Back pain?: Yes Joint pain?: No  Neurological Headaches?: No Dizziness?: No  Psychologic Depression?: No Anxiety?: No  Physical Exam: BP 155/72 mmHg  Pulse 69  Ht 5\' 6"  (1.676 m)  Wt 177 lb 9.6 oz  (80.559 kg)  BMI 28.68 kg/m2  Constitutional: Well nourished. Alert and oriented, No acute distress. HEENT: [place] AT, moist mucus membranes. Trachea midline, no masses. Cardiovascular: No clubbing, cyanosis, or edema. Respiratory: Normal respiratory effort, no increased work of breathing. GI: Abdomen is soft, non tender, non distended, no abdominal masses. Liver and spleen not palpable.  No hernias appreciated.  Stool sample for occult testing is not indicated.   GU: No CVA tenderness.  No bladder fullness or masses.  Patient with circumcised phallus.  Urethral meatus is patent.  No penile discharge. No penile lesions or rashes. Scrotum without lesions, cysts, rashes and/or edema.  Testicles are located scrotally bilaterally. No masses are appreciated in the testicles. Left and right epididymis are normal. Rectal: Patient with  normal sphincter tone. Anus and perineum without scarring or rashes. No rectal masses are appreciated. Prostate is approximately 40 grams, no nodules are appreciated. Seminal vesicles are normal. Skin: No rashes, bruises or suspicious lesions. Lymph: No cervical or inguinal adenopathy. Neurologic: Grossly intact, no focal deficits, moving all 4 extremities. Psychiatric: Normal mood and affect.  Laboratory Data: Urinalysis Microscopic Examination  Result Value Ref Range   WBC, UA 0-5 0 -  5 /hpf   RBC, UA None seen  0 -  2 /hpf   Epithelial Cells (non renal) None seen 0 - 10 /hpf   Crystals Present (A) N/A   Crystal Type Amorphous Sediment N/A   Bacteria, UA Moderate (A) None seen/Few   Yeast, UA Present (A) None seen  Urinalysis, Complete  Result Value Ref Range   Specific Gravity, UA 1.015 1.005 - 1.030   pH, UA 7.0 5.0 - 7.5   Color, UA Yellow Yellow   Appearance Ur Cloudy (A) Clear   Leukocytes, UA Negative Negative   Protein, UA Negative Negative/Trace   Glucose, UA Negative Negative   Ketones, UA Negative Negative   RBC, UA Negative Negative   Bilirubin,  UA Negative Negative   Urobilinogen, Ur 0.2 0.2 - 1.0 mg/dL   Nitrite, UA Negative Negative   Microscopic Examination See below:     Pertinent Imaging: Results for Dustin LainBURNS, Dustin DAVID (MRN [id number]) as of 04/11/2015 15:28  Ref. Range 04/11/2015 15:17  Scan Result Unknown 22    Assessment & Plan:    1. Urinary frequency:   Patient will increase his Myrbetriq 50 mg to everyday.  He will return to clinic in one month for an IPSS score and PVR.  I will send the urine for culture.  If he has an UTI, we will treat with the appropriate antibiotic.    - Urinalysis, Complete - BLADDER SCAN AMB NON-IMAGING  2. BPH with LUTS:   IPSS score is 18/4.   He will have his IPSS score and PVR in one month.    3. Hypogonadism:   Managed by endocrinology.    Return in about 1 month (around 05/12/2015) for IPSS score and PVR.  These notes generated with voice recognition software. I apologize for typographical errors.  Michiel CowboySHANNON Maximilian Tallo, PA-C  [facility] [address], [address] [place], [place] [address] [phone]

## 2015-04-13 LAB — CULTURE, URINE COMPREHENSIVE

## 2015-04-14 ENCOUNTER — Telehealth: Payer: Self-pay

## 2015-04-14 NOTE — Telephone Encounter (Signed)
-----   Message from Harle BattiestShannon A McGowan, PA-C sent at 04/13/2015  2:02 PM EDT ----- Patient does not have an UTI.  So, his frequency is not due to an infection.  We will see him on the 25th for follow up.

## 2015-04-14 NOTE — Telephone Encounter (Signed)
LMOM

## 2015-04-15 DIAGNOSIS — R350 Frequency of micturition: Secondary | ICD-10-CM | POA: Insufficient documentation

## 2015-04-15 DIAGNOSIS — N401 Enlarged prostate with lower urinary tract symptoms: Secondary | ICD-10-CM

## 2015-04-15 DIAGNOSIS — N138 Other obstructive and reflux uropathy: Secondary | ICD-10-CM | POA: Insufficient documentation

## 2015-04-15 NOTE — Telephone Encounter (Signed)
Spoke with patient and gave him the message   Dustin Turner

## 2015-04-18 ENCOUNTER — Telehealth: Payer: Self-pay

## 2015-04-18 NOTE — Telephone Encounter (Signed)
LMOM

## 2015-04-18 NOTE — Telephone Encounter (Signed)
-----   Message from Harle BattiestShannon A McGowan, PA-C sent at 04/13/2015  2:02 PM EDT ----- Patient does not have an UTI.  So, his frequency is not due to an infection.  We will see him on the 25th for follow up.

## 2015-04-18 NOTE — Telephone Encounter (Signed)
Spoke with pt in reference to not having a UTI and frequency. Pt voiced understanding.

## 2015-05-10 ENCOUNTER — Encounter: Payer: Self-pay | Admitting: Urology

## 2015-05-10 ENCOUNTER — Ambulatory Visit (INDEPENDENT_AMBULATORY_CARE_PROVIDER_SITE_OTHER): Payer: Medicare Other | Admitting: Urology

## 2015-05-10 VITALS — BP 126/75 | HR 54 | Temp 97.5°F | Resp 16 | Ht 67.0 in | Wt 180.0 lb

## 2015-05-10 DIAGNOSIS — N401 Enlarged prostate with lower urinary tract symptoms: Secondary | ICD-10-CM

## 2015-05-10 DIAGNOSIS — E291 Testicular hypofunction: Secondary | ICD-10-CM | POA: Diagnosis not present

## 2015-05-10 DIAGNOSIS — R3915 Urgency of urination: Secondary | ICD-10-CM | POA: Diagnosis not present

## 2015-05-10 DIAGNOSIS — N138 Other obstructive and reflux uropathy: Secondary | ICD-10-CM

## 2015-05-10 LAB — BLADDER SCAN AMB NON-IMAGING: SCAN RESULT: 23

## 2015-05-10 NOTE — Progress Notes (Signed)
3:42 PM   Dustin Turner 10/30/1935 [id number]  Referring provider: Berneice GandyVicki S Fowler, MD [address] [address] [place], [place] [id number]  Chief Complaint  Patient presents with  . Urinary Frequency  . Urinary Urgency    HPI: Patient is a 80 year old Caucasian male who was experiencing a worsening in his urinary frequency and nocturia 1 month ago and has Myrbetriq increased to 50 mg every day who presents today for follow-up.  Hypogonadism Patient is on AndroGel for his hypogonadism.  His hypogonadism is being managed by his endocrinologist.  BPH WITH LUTS His IPSS score today is 10, which is moderate lower urinary tract symptomatology. He is mostly dissatisfied with his quality life due to his urinary symptoms. His PVR is 23 mL.  He found no improvement in his urinary symptoms taking the Myrbetriq every day.   He also stated he feels bad taking the Myrbetriq every day.  He states he is still getting up 3-5 times to urinate.  He has had these symptoms for over one month.  He denies any dysuria, hematuria or suprapubic pain.   He also denies any recent fevers, chills, nausea or vomiting.  He does not have a family history of PCa.      IPSS      04/11/15 1500 05/10/15 1500     International Prostate Symptom Score   How often have you had the sensation of not emptying your bladder? Almost always Less than half the time    How often have you had to urinate less than every two hours? About half the time Not at All    How often have you found you stopped and started again several times when you urinated? Less than 1 in 5 times Not at All    How often have you found it difficult to postpone urination? More than half the time More than half the time    How often have you had a weak urinary stream? Not at All Not at All    How often have you had to strain to start urination? Less than 1 in 5 times Less than 1 in 5 times    How many times did you typically get up at night to urinate? 4 Times 3  Times    Total IPSS Score 18 10    Quality of Life due to urinary symptoms   If you were to spend the rest of your life with your urinary condition just the way it is now how would you feel about that? Mostly Disatisfied Mostly Disatisfied       Score:  1-7 Mild 8-19 Moderate 20-35 Severe  Frequency Patient noted an increase in day and night time urination.  This has been occuring over the last month.  He does have OSA and sleeps with the CPAP machine.  He found no improvement with taking the Myrbetriq 50 mg daily.   His PVR on today's exam was 23 mL.     PMH: Past Medical History  Diagnosis Date  . Scoliosis   . BPH (benign prostatic hyperplasia)   . Hypogonadism in male   . Urinary urgency   . Neurogenic bladder   . Arthritis   . Spinal stenosis   . Insomnia   . Venous stasis   . OP (osteoporosis)   . DDD (degenerative disc disease), cervical     Surgical History: Past Surgical History  Procedure Laterality Date  . Hernia repair    . Thoracic spine  surgery    . Joint replacement    . Rotator cuff repair    . Cervical spine surgery    . Lumbar spine surgery    . Proleive system treatment  2006    Home Medications:    Medication List       This list is accurate as of: 05/10/15  3:42 PM.  Always use your most recent med list.               alendronate 70 MG tablet  Commonly known as:  FOSAMAX     ammonium lactate 12 % lotion  Commonly known as:  LAC-HYDRIN  Apply topically.     ARICEPT 10 MG tablet  Generic drug:  donepezil  10 mg.     CALCIUM 500/D 500-200 MG-UNIT tablet  Generic drug:  calcium-vitamin D  Take by mouth.     ciprofloxacin 0.3 % ophthalmic solution  Commonly known as:  CILOXAN  Reported on 05/10/2015     D 400 400 units Chew  Generic drug:  Cholecalciferol  Chew by mouth. Reported on 04/11/2015     erythromycin ophthalmic ointment  Reported on 05/10/2015     Eszopiclone 3 MG Tabs  Reported on 04/11/2015     fentaNYL 800 MCG  lollipop  Commonly known as:  ACTIQ  Reported on 04/11/2015     FISH OIL + D3 1000-1000 MG-UNIT Caps  Take by mouth.     gabapentin 400 MG capsule  Commonly known as:  NEURONTIN  Take 400 mg by mouth.     HYDROcodone-acetaminophen 10-325 MG tablet  Commonly known as:  NORCO  Take by mouth.     HYDROmorphone (PF) 250 MG injection  Commonly known as:  DILAUDID HP  Inject 10 mg as directed continuously.     ibuprofen 800 MG tablet  Commonly known as:  ADVIL,MOTRIN  Take 800 mg by mouth.     LINZESS 290 MCG Caps capsule  Generic drug:  linaclotide     LORZONE 750 MG Tabs  Generic drug:  Chlorzoxazone  Take by mouth. Reported on 04/11/2015     magnesium hydroxide 400 MG/5ML suspension  Commonly known as:  MILK OF MAGNESIA  Take by mouth. Reported on 04/11/2015     memantine 10 MG tablet  Commonly known as:  NAMENDA  Take by mouth.     METANX 3-90.314-2-35 MG Caps  Reported on 04/11/2015     methylphenidate 10 MG tablet  Commonly known as:  RITALIN  Take by mouth.     mexiletine 200 MG capsule  Commonly known as:  MEXITIL  Reported on 04/11/2015     mirabegron ER 50 MG Tb24 tablet  Commonly known as:  MYRBETRIQ  Take 1 tablet (50 mg total) by mouth daily.     MOVANTIK 12.5 MG Tabs tablet  Generic drug:  naloxegol oxalate     NAPROXEN DR 500 MG EC tablet  Generic drug:  naproxen  Take by mouth.     NARCAN 4 MG/0.1ML Liqd  Generic drug:  Naloxone HCl  Reported on 04/11/2015     oxyCODONE 40 mg 12 hr tablet  Commonly known as:  OXYCONTIN  Take by mouth. Reported on 04/11/2015     PEN NEEDLES 31GX5/16" 31G X 8 MM Misc  1 each by Does not apply route.     RESTASIS 0.05 % ophthalmic emulsion  Generic drug:  cycloSPORINE     SUPER BIOTIN 5 MG Tabs  Generic drug:  Biotin  10,000 mg.     Testosterone 20.25 MG/ACT (1.62%) Gel  Place onto the skin.     triamcinolone cream 0.1 %  Commonly known as:  KENALOG  apply to affected area twice a day UNTIL IMPROVED       vitamin B-12 1000 MCG tablet  Commonly known as:  CYANOCOBALAMIN  Take by mouth.     vitamin C 1000 MG tablet  Take 460 mg by mouth.     VOLTAREN 1 % Gel  Generic drug:  diclofenac sodium  Apply topically.        Allergies:  Allergies  Allergen Reactions  . Amitriptyline   . Nortriptyline Other (See Comments)    GI  upset    Family History: Family History  Problem Relation Age of Onset  . Diabetes Father   . Diabetes Brother   . Stroke Mother   . Stroke Father   . Alcohol abuse Father   . Alcohol abuse Brother   . Kidney disease Neg Hx   . Prostate cancer Neg Hx     Social History:  reports that he has never smoked. He does not have any smokeless tobacco history on file. He reports that he does not drink alcohol or use illicit drugs.  ROS: UROLOGY Frequent Urination?: Yes Hard to postpone urination?: No Burning/pain with urination?: No Get up at night to urinate?: Yes Leakage of urine?: No Urine stream starts and stops?: No Trouble starting stream?: No Do you have to strain to urinate?: No Blood in urine?: No Urinary tract infection?: No Sexually transmitted disease?: No Injury to kidneys or bladder?: No Painful intercourse?: No Weak stream?: No Erection problems?: Yes Penile pain?: No  Gastrointestinal Nausea?: No Vomiting?: No Indigestion/heartburn?: Yes Diarrhea?: No Constipation?: Yes  Constitutional Fever: No Night sweats?: No Weight loss?: No Fatigue?: No  Skin Skin rash/lesions?: No Itching?: No  Eyes Blurred vision?: No Double vision?: No  Ears/Nose/Throat Sore throat?: No Sinus problems?: No  Hematologic/Lymphatic Swollen glands?: No Easy bruising?: No  Cardiovascular Leg swelling?: No Chest pain?: No  Respiratory Cough?: No Shortness of breath?: No  Endocrine Excessive thirst?: No  Musculoskeletal Back pain?: Yes Joint pain?: Yes  Neurological Headaches?: No Dizziness?:  No  Psychologic Depression?: No Anxiety?: No  Physical Exam: BP 126/75 mmHg  Pulse 54  Temp(Src) 97.5 F (36.4 C)  Resp 16  Ht  (1.702 m)  Wt 180 lb (81.647 kg)  BMI 28.19 kg/m2  Constitutional: Well nourished. Alert and oriented, No acute distress. HEENT: [place] AT, moist mucus membranes. Trachea midline, no masses. Cardiovascular: No clubbing, cyanosis, or edema. Respiratory: Normal respiratory effort, no increased work of breathing. Skin: No rashes, bruises or suspicious lesions. Lymph: No cervical or inguinal adenopathy. Neurologic: Grossly intact, no focal deficits, moving all 4 extremities. Psychiatric: Normal mood and affect.  Laboratory Data:  Pertinent Imaging: Results for Dustin Turner, Dustin Turner (MRN [id number]) as of 05/15/2015 16:47  Ref. Range 05/10/2015 16:17  Scan Result Unknown 23    Assessment & Plan:    1. Urinary frequency:   Patient did not find any improvement taking the Myrbetriq daily.  He is going to stop his Myrbetriq and take Toviaz 4 mg daily to see what effect that may have on his urinary symptoms. He call in 2 weeks with his response to the medication.   - BLADDER SCAN AMB NON-IMAGING  2. BPH with LUTS:   IPSS score is 10/4.   See above.    3. Hypogonadism:   Managed  by endocrinology.    Return for patient to call for a follow up.  These notes generated with voice recognition software. I apologize for typographical errors.  Michiel Cowboy, PA-C  [facility] [address] [place], [place] [address] [phone]

## 2015-05-22 ENCOUNTER — Encounter: Payer: Self-pay | Admitting: *Deleted

## 2015-05-22 ENCOUNTER — Emergency Department
Admission: EM | Admit: 2015-05-22 | Discharge: 2015-05-22 | Disposition: A | Discharge status: Home / Self Care | Payer: Medicare Other | Attending: Emergency Medicine | Admitting: Emergency Medicine

## 2015-05-22 ENCOUNTER — Emergency Department: Payer: Medicare Other

## 2015-05-22 DIAGNOSIS — I10 Essential (primary) hypertension: Secondary | ICD-10-CM | POA: Diagnosis not present

## 2015-05-22 DIAGNOSIS — Z79891 Long term (current) use of opiate analgesic: Secondary | ICD-10-CM | POA: Diagnosis not present

## 2015-05-22 DIAGNOSIS — M810 Age-related osteoporosis without current pathological fracture: Secondary | ICD-10-CM | POA: Diagnosis not present

## 2015-05-22 DIAGNOSIS — M5030 Other cervical disc degeneration, unspecified cervical region: Secondary | ICD-10-CM | POA: Insufficient documentation

## 2015-05-22 DIAGNOSIS — Y999 Unspecified external cause status: Secondary | ICD-10-CM | POA: Insufficient documentation

## 2015-05-22 DIAGNOSIS — Z79899 Other long term (current) drug therapy: Secondary | ICD-10-CM | POA: Diagnosis not present

## 2015-05-22 DIAGNOSIS — X58XXXA Exposure to other specified factors, initial encounter: Secondary | ICD-10-CM | POA: Diagnosis not present

## 2015-05-22 DIAGNOSIS — S82001A Unspecified fracture of right patella, initial encounter for closed fracture: Secondary | ICD-10-CM | POA: Insufficient documentation

## 2015-05-22 DIAGNOSIS — Y939 Activity, unspecified: Secondary | ICD-10-CM | POA: Insufficient documentation

## 2015-05-22 DIAGNOSIS — Z791 Long term (current) use of non-steroidal anti-inflammatories (NSAID): Secondary | ICD-10-CM | POA: Diagnosis not present

## 2015-05-22 DIAGNOSIS — M5137 Other intervertebral disc degeneration, lumbosacral region: Secondary | ICD-10-CM | POA: Insufficient documentation

## 2015-05-22 DIAGNOSIS — E785 Hyperlipidemia, unspecified: Secondary | ICD-10-CM | POA: Diagnosis not present

## 2015-05-22 DIAGNOSIS — Y929 Unspecified place or not applicable: Secondary | ICD-10-CM | POA: Diagnosis not present

## 2015-05-22 DIAGNOSIS — S8991XA Unspecified injury of right lower leg, initial encounter: Secondary | ICD-10-CM | POA: Diagnosis present

## 2015-05-22 DIAGNOSIS — Z794 Long term (current) use of insulin: Secondary | ICD-10-CM | POA: Diagnosis not present

## 2015-05-22 DIAGNOSIS — F329 Major depressive disorder, single episode, unspecified: Secondary | ICD-10-CM | POA: Insufficient documentation

## 2015-05-22 LAB — BASIC METABOLIC PANEL
Anion gap: 10 (ref 5–15)
BUN: 23 mg/dL — AB (ref 6–20)
CALCIUM: 8.8 mg/dL — AB (ref 8.9–10.3)
CHLORIDE: 101 mmol/L (ref 101–111)
CO2: 26 mmol/L (ref 22–32)
Creatinine, Ser: 0.96 mg/dL (ref 0.61–1.24)
GLUCOSE: 101 mg/dL — AB (ref 65–99)
Potassium: 4.5 mmol/L (ref 3.5–5.1)
Sodium: 137 mmol/L (ref 135–145)

## 2015-05-22 LAB — CBC WITH DIFFERENTIAL/PLATELET
Basophils Absolute: 0 10*3/uL (ref 0–0.1)
Basophils Relative: 0 %
Eosinophils Absolute: 0.1 10*3/uL (ref 0–0.7)
Eosinophils Relative: 3 %
HEMATOCRIT: 44.1 % (ref 40.0–52.0)
HEMOGLOBIN: 14.8 g/dL (ref 13.0–18.0)
LYMPHS ABS: 0.9 10*3/uL — AB (ref 1.0–3.6)
LYMPHS PCT: 19 %
MCH: 31.9 pg (ref 26.0–34.0)
MCHC: 33.5 g/dL (ref 32.0–36.0)
MCV: 95.2 fL (ref 80.0–100.0)
MONOS PCT: 13 %
Monocytes Absolute: 0.7 10*3/uL (ref 0.2–1.0)
NEUTROS ABS: 3.4 10*3/uL (ref 1.4–6.5)
NEUTROS PCT: 65 %
Platelets: 116 10*3/uL — ABNORMAL LOW (ref 150–440)
RBC: 4.63 MIL/uL (ref 4.40–5.90)
RDW: 14.1 % (ref 11.5–14.5)
WBC: 5.1 10*3/uL (ref 3.8–10.6)

## 2015-05-22 MED ORDER — HYDROCODONE-ACETAMINOPHEN 5-325 MG PO TABS: 1.0000 | ORAL_TABLET | Freq: Four times a day (QID) | ORAL | Status: DC | PRN

## 2015-05-22 NOTE — ED Notes (Signed)
States he had a right knee replaced 20 years ago then states pain started back about 6 months ago w/o injury  Pain has increased over the past couple of days . No swelling noted but has smallbruised area to lateral aspect of knee

## 2015-05-22 NOTE — ED Notes (Signed)
Pt arrived to Ed reporting right knee pain for the past 6 months that worsened yesterday. Pt denies trauma or injury to the area. Pts right knee was reported to have been replaced 2 years ago. Pt reports increased pain when bearing weight.

## 2015-05-22 NOTE — ED Provider Notes (Signed)
[facility] Emergency Department Provider Note  ____________________________________________  Time seen: Approximately 2:47 PM  I have reviewed the triage vital signs and the nursing notes.   HISTORY  Chief Complaint Knee Pain    HPI Dustin Turner is a 80 y.o. male , NAD, presents to the emergency department with a 6 month history of right knee pain. States pain increased yesterday on the lateral side of the right knee. Denies any injuries, falls, traumas. Notes that he is disabled and does not use his knees that much. States that he noted a "dent" on the lateral side of the right knee after he fell asleep at his sink. States this is common for him to fall asleep when he goes to sit at the sink to wash his hands. Did note a small bruise on the inside of the knee. Denies any open wounds, lesions, redness, warmth. Has had full range of motion of the right knee. Most of his pain increases when he bears weight on the knee.   Past Medical History  Diagnosis Date  . Scoliosis   . BPH (benign prostatic hyperplasia)   . Hypogonadism in male   . Urinary urgency   . Neurogenic bladder   . Arthritis   . Spinal stenosis   . Insomnia   . Venous stasis   . OP (osteoporosis)   . DDD (degenerative disc disease), cervical     Patient Active Problem List   Diagnosis Date Noted  . Urinary frequency 04/15/2015  . BPH with obstruction/lower urinary tract symptoms 04/15/2015  . Abdominal pain 07/23/2014  . Abnormal gait 07/23/2014  . Absolute anemia 07/23/2014  . Cognitive disorder 07/23/2014  . Narrowing of intervertebral disc space 07/23/2014  . Clinical depression 07/23/2014  . Acid reflux 07/23/2014  . Abdominal hernia 07/23/2014  . HLD (hyperlipidemia) 07/23/2014  . BP (high blood pressure) 07/23/2014  . Amnesia 07/23/2014  . 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 Scoliosis 07/23/2014  . Compressed spine fracture (HCC) 07/23/2014  . Spinal stenosis 07/23/2014   . Facial numbness 06/03/2014  . Spasm 04/09/2014  . Amnestic syndrome, drug-induced (HCC) 12/08/2013  . Obstructive apnea 12/08/2013  . Generalized OA 11/19/2013  . Bad memory 11/19/2013  . Foot pain 09/04/2013  . Polypharmacy 08/20/2013  . 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 Abnormal finding on thyroid function test 07/30/2013  . Daytime somnolence 07/30/2013  . Encounter for screening for lipoid disorders 07/30/2013  . OP (osteoporosis) 07/30/2013  . Chronic pain 06/29/2013  . Cannot sleep 06/29/2013  . Dorsopathy 06/29/2013  . Dermatitis, stasis 06/29/2013  . Blue color skin 06/05/2013  . Excessive falling 06/05/2013  . Able to mobilize using mobility aids 06/05/2013  . Curvature of spine 10/10/2012  . Acquired deformities of spine 10/10/2012  . 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 FOM (frequency of micturition) 08/21/2011  . Cervical spondylosis without myelopathy 05/03/2011  . Cervical osteoarthritis 05/03/2011  . DDD (degenerative disc disease), lumbosacral 05/03/2011  . Angulation of spine 05/03/2011  . LBP (low back pain) 05/03/2011  . Degenerative arthritis of lumbar spine 05/03/2011  . Lumbosacral spondylosis 05/03/2011  . Back pain, chronic 03/29/2011  . Benign fibroma of prostate 03/29/2011  . Urgency of micturation 03/29/2011  . Hernia, incisional 08/21/2010  . Exomphalos 08/21/2010  . Arthritis 08/07/2010  . CN (constipation) 08/07/2010  . Exposure to radiation 03/15/1953    Past Surgical History  Procedure Laterality Date  . Hernia repair    . Thoracic  spine surgery    . Joint replacement    . Rotator cuff repair    . Cervical spine surgery    . Lumbar spine surgery    . Proleive system treatment  2006    Current Outpatient Rx  Name  Route  Sig  Dispense  Refill  . alendronate (FOSAMAX) 70 MG tablet               . ammonium lactate (LAC-HYDRIN) 12 % lotion   Topical   Apply topically.         . Ascorbic  Acid (VITAMIN C) 1000 MG tablet   Oral   Take 460 mg by mouth.         . Biotin (SUPER BIOTIN) 5 MG TABS      10,000 mg.         . calcium-vitamin D (CALCIUM 500/D) 500-200 MG-UNIT per tablet   Oral   Take by mouth.         . Chlorzoxazone (LORZONE) 750 MG TABS   Oral   Take by mouth. Reported on 04/11/2015         . Cholecalciferol (D 400) 400 units CHEW   Oral   Chew by mouth. Reported on 04/11/2015         . ciprofloxacin (CILOXAN) 0.3 % ophthalmic solution      Reported on 05/10/2015      0   . cycloSPORINE (RESTASIS) 0.05 % ophthalmic emulsion               . diclofenac sodium (VOLTAREN) 1 % GEL   Topical   Apply topically.         . donepezil (ARICEPT) 10 MG tablet      10 mg.         . erythromycin ophthalmic ointment      Reported on 05/10/2015      0   . Eszopiclone 3 MG TABS      Reported on 04/11/2015      0   . fentaNYL (ACTIQ) 800 MCG lollipop      Reported on 04/11/2015         . Fish Oil-Cholecalciferol (FISH OIL + D3) 1000-1000 MG-UNIT CAPS   Oral   Take by mouth.         . gabapentin (NEURONTIN) 400 MG capsule   Oral   Take 400 mg by mouth.         [name] Kitchen. HYDROmorphone, PF, (DILAUDID HP) 250 MG injection      Inject 10 mg as directed continuously.          [name] Kitchen. ibuprofen (ADVIL,MOTRIN) 800 MG tablet   Oral   Take 800 mg by mouth.         . Insulin Pen Needle (PEN NEEDLES 31GX5/16") 31G X 8 MM MISC   Does not apply   1 each by Does not apply route.         [name] Kitchen. L-Methylfolate-Algae-B12-B6 (METANX) 3-90.314-2-35 MG CAPS      Reported on 04/11/2015         . LINZESS 290 MCG CAPS capsule                 Dispense as written.   . magnesium hydroxide (MILK OF MAGNESIA) 400 MG/5ML suspension   Oral   Take by mouth. Reported on 04/11/2015         . memantine (NAMENDA) 10 MG tablet   Oral   Take by mouth.         .[name] Kitchen  methylphenidate (RITALIN) 10 MG tablet   Oral   Take by mouth.         .  mexiletine (MEXITIL) 200 MG capsule      Reported on 04/11/2015      0   . mirabegron ER (MYRBETRIQ) 50 MG TB24 tablet   Oral   Take 1 tablet (50 mg total) by mouth daily.   90 tablet   3   . MOVANTIK 12.5 MG TABS tablet                 Dispense as written.   . naproxen (NAPROXEN DR) 500 MG EC tablet   Oral   Take by mouth.         Jonelle Sports 4 MG/0.1ML LIQD      Reported on 04/11/2015      0     Dispense as written.   [name] Kitchen oxyCODONE (OXYCONTIN) 40 mg 12 hr tablet   Oral   Take by mouth. Reported on 04/11/2015         . Testosterone 20.25 MG/ACT (1.62%) GEL   Transdermal   Place onto the skin.         [name] Kitchen triamcinolone cream (KENALOG) 0.1 %      apply to affected area twice a day UNTIL IMPROVED      0   . vitamin B-12 (CYANOCOBALAMIN) 1000 MCG tablet   Oral   Take by mouth.           Allergies Amitriptyline and Nortriptyline  Family History  Problem Relation Age of Onset  . Diabetes Father   . Diabetes Brother   . Stroke Mother   . Stroke Father   . Alcohol abuse Father   . Alcohol abuse Brother   . Kidney disease Neg Hx   . Prostate cancer Neg Hx     Social History Social History  Substance Use Topics  . Smoking status: Never Smoker   . Smokeless tobacco: None  . Alcohol Use: No     Review of Systems  Constitutional: No fever/chills Eyes: No visual changes.  Cardiovascular: No chest pain, Palpitations. Respiratory:  No shortness of breath. Musculoskeletal: Positive right knee pain. No back pain. Skin: Positive bruise right lateral knee. Negative for rash or skin sores, open wounds, oozing, weeping. Neurological: Negative for headaches, focal weakness or numbness. No tingling. 10-point ROS otherwise negative.  ____________________________________________   PHYSICAL EXAM:  VITAL SIGNS: ED Triage Vitals  Enc Vitals Group     BP 05/22/15 1431 131/72 mmHg     Pulse Rate 05/22/15 1431 58     Resp 05/22/15 1431 16     Temp  05/22/15 1431 98 F (36.7 C)     Temp Source 05/22/15 1431 Oral     SpO2 05/22/15 1431 98 %     Weight 05/22/15 1431 180 lb (81.647 kg)     Height 05/22/15 1431 5\' 7"  (1.702 m)     Head Cir --      Peak Flow --      Pain Score 05/22/15 1432 8     Pain Loc --      Pain Edu? --      Excl. in GC? --      Constitutional: Alert and oriented. Well appearing and in no acute distress. Eyes: Conjunctivae are normal.  Head: Atraumatic. Cardiovascular: Good peripheral circulation with brisk 2+ pulses noted and the right lower extremity. Respiratory: Normal respiratory effort without tachypnea or retractions. Musculoskeletal: Mild tenderness  to palpation about the lateral right knee. No pain with patellofemoral grind test. Full range of motion to the patient's baseline of the right knee. No joint effusions to palpation of the right knee. No lower extremity tenderness nor edema.   Neurologic:  Normal speech and language. No gross focal neurologic deficits are appreciated. Sensation is grossly intact about the right lower extremity. Skin:  Mild erythema and warmth to palpation about the lateral and anterior portion of the right knee. No skin lesions, open wounds, oozing, weeping is noted. No fluctuance or induration noted about the knee. Psychiatric: Mood and affect are normal. Speech and behavior are normal. Patient exhibits appropriate insight and judgement.   ____________________________________________   LABS (all labs ordered are listed, but only abnormal results are displayed)  Labs Reviewed  CBC WITH DIFFERENTIAL/PLATELET  BASIC METABOLIC PANEL   ____________________________________________  EKG  None ____________________________________________  RADIOLOGY I have personally viewed and evaluated these images (plain radiographs) as part of my medical decision making, as well as reviewing the written report by the radiologist.  Dg Knee Complete 4 Views Right  05/22/2015  CLINICAL  DATA:  Pain EXAM: RIGHT KNEE - COMPLETE 4+ VIEW COMPARISON:  None. FINDINGS: Frontal, lateral, bilateral oblique, and sunrise patellar images were obtained. The patient has had total knee replacement with prosthetic components appearing well-seated. There is evidence of a fracture of the junction of the proximal and mid thirds of the patella, best appreciated on an oblique view. Alignment is essentially anatomic in this area. No other fracture evident. No dislocation. There is a small joint effusion. IMPRESSION: Age uncertain patellar fracture with alignment anatomic. Small joint effusion. Status post total knee replacement. No dislocation. Electronically Signed   By: Bretta Bang III M.D.   On: 05/22/2015 15:37    ____________________________________________    PROCEDURES  Procedure(s) performed: None    Medications - No data to display   ____________________________________________   INITIAL IMPRESSION / ASSESSMENT AND PLAN / ED COURSE  Pertinent labs & imaging results that were available during my care of the patient were reviewed by me and considered in my medical decision making (see chart for details). CBC and basic metabolic panel were drawn to ensure patient had no signs of infection due to mild warmth and redness to the right knee. Right knee x-ray showed no significant signs of infection but did note an indeterminate age to right patella fracture. Patient is not having any pain in that area but we will splint the right knee in an immobilizer to protect. Patient is agreeable to be discharged home and called with CBC and metabolic panel results.  Patient's diagnosis is consistent with closed fracture of the right patella of indeterminate age. Patient will be discharged home with instructions for home care. He was placed in a right knee immobilizer should stay and such until seen in follow-up by orthopedics. Patient declined prescription for Norco as he states he has pain  medications at home. The prescription was ripped up and placed in the appropriate shred container in the flex department.. Patient is to follow up with Dr. Rosita Kea orthopedics in 3 days for recheck.  Patient is given ED precautions to return to the ED for any worsening or new symptoms.    ____________________________________________  FINAL CLINICAL IMPRESSION(S) / ED DIAGNOSES  Final diagnoses:  Fracture of right patella, closed, initial encounter      NEW MEDICATIONS STARTED DURING THIS VISIT:  New Prescriptions   No medications on file  Hope Pigeon, PA-C 05/22/15 1558  Jene Every, MD 05/24/15 1425

## 2015-05-22 NOTE — ED Notes (Signed)
Patient states he had knee replacement to right knee 20 years ago. Started having pain in same knee a couple of days ago. Denies any injury.

## 2015-05-22 NOTE — Discharge Instructions (Signed)
Patellar Fracture, Adult °A patellar fracture is a break in your kneecap (patella).  °CAUSES  °· A direct blow to the knee or a fall is usually the cause of a broken patella. °· A very hard and strong bending of your knee can cause a patellar fracture. °RISK FACTORS °Involvement in contact sports, especially sports that involve a lot of jumping. °SIGNS AND SYMPTOMS  °· Tender and swollen knee. °· Pain when you move your knee, especially when you try to straighten out your leg. °· Difficulty walking or putting weight on your knee. °· Misshapen knee (as if a bone is out of place). °DIAGNOSIS  °Patellar fracture is usually diagnosed with a physical exam and an X-ray exam. °TREATMENT  °Treatment depends on the type of fracture: °· If your patella is still in the right position after the fracture and you can still straighten your leg out, you can usually be treated with a splint or cast for 4-6 weeks. °· If your patella is broken into multiple small pieces but you are able to straighten your leg, you can usually be treated with a splint or cast for 4-6 weeks. Sometimes your patella may need to be removed before the cast is applied. °· If you cannot straighten out your leg after a patellar fracture, then surgery is required to hold the bony fragments together until they heal. A cast or splint will be applied for 4-6 weeks. °HOME CARE INSTRUCTIONS  °· Only take over-the-counter or prescription medicines for pain, discomfort, or fever as directed by your health care provider. °· Use crutches as directed, and exercise the leg as directed. °· Apply ice to the injured area: °¨ Put ice in a plastic bag. °¨ Place a towel between your skin and the bag. °¨ Leave the ice on for 20 minutes, 2-3 times a day. °· Elevate the affected knee above the level of your heart. °SEEK MEDICAL CARE IF: °· You suspect you have significantly injured your knee. °· You hear a pop after a knee injury. °· Your knee is misshapen after a knee  injury. °· You have pain when you move your knee. °· You have difficulty walking or putting weight on your knee. °· You cannot fully move your knee. °SEEK IMMEDIATE MEDICAL CARE IF: °· You have redness, swelling, or increasing pain in your knee. °· You have a fever. °  °This information is not intended to replace advice given to you by your health care provider. Make sure you discuss any questions you have with your health care provider. °  °Document Released: 09/30/2002 Document Revised: 10/22/2012 Document Reviewed: 08/13/2012 °Elsevier Interactive Patient Education ©2016 [employer] ° °

## 2015-05-26 ENCOUNTER — Telehealth: Payer: Self-pay | Admitting: Urology

## 2015-05-26 DIAGNOSIS — E291 Testicular hypofunction: Secondary | ICD-10-CM

## 2015-05-26 NOTE — Telephone Encounter (Signed)
Patient called this office this afternoon to provide information to Michiel CowboyShannon McGowan, PA-C:  On 05/12/2015, patient did NOT stop his Myrbetriq, but reduced the amount of Androgel by 1 pump daily to see if his nighttime urinary frequency would improve.  He noticed a slight improvement (maybe 1 x per night improvement), but not a big difference.  He stated that he has bad osteoporosis and is concerned about his bone health.  Should he go back to his original plan or continue with the decreased dosage of the Androgel.  Please advise.

## 2015-05-27 DIAGNOSIS — R3915 Urgency of urination: Secondary | ICD-10-CM | POA: Insufficient documentation

## 2015-05-27 DIAGNOSIS — E291 Testicular hypofunction: Secondary | ICD-10-CM | POA: Insufficient documentation

## 2015-05-27 NOTE — Telephone Encounter (Signed)
The last testosterone that I see on Dustin Turner was performed 1 year ago it was 700.  Suggest continuing with 1 pump daily and rechecking his testosterone. He can come in some time next week for blood draw. It does not have to be a morning appointment.

## 2015-05-27 NOTE — Telephone Encounter (Signed)
Spoke with pt in reference to testosterone results and rechecking labs in 71mo. Pt voiced understanding. Lab appt made and orders placed.

## 2015-05-27 NOTE — Telephone Encounter (Signed)
LMOM

## 2015-06-16 ENCOUNTER — Other Ambulatory Visit: Payer: Self-pay | Admitting: Student

## 2015-06-16 DIAGNOSIS — Z96651 Presence of right artificial knee joint: Secondary | ICD-10-CM

## 2015-06-16 DIAGNOSIS — M25561 Pain in right knee: Secondary | ICD-10-CM

## 2015-06-23 ENCOUNTER — Ambulatory Visit
Admission: RE | Admit: 2015-06-23 | Discharge: 2015-06-23 | Disposition: A | Discharge status: Home / Self Care | Payer: Medicare Other | Source: Ambulatory Visit | Attending: Student | Admitting: Student

## 2015-06-23 DIAGNOSIS — M25561 Pain in right knee: Secondary | ICD-10-CM | POA: Insufficient documentation

## 2015-06-23 DIAGNOSIS — M25461 Effusion, right knee: Secondary | ICD-10-CM | POA: Insufficient documentation

## 2015-06-23 DIAGNOSIS — Z96651 Presence of right artificial knee joint: Secondary | ICD-10-CM | POA: Diagnosis present

## 2015-06-27 ENCOUNTER — Other Ambulatory Visit: Payer: Medicare Other

## 2015-06-27 DIAGNOSIS — E291 Testicular hypofunction: Secondary | ICD-10-CM

## 2015-06-28 ENCOUNTER — Telehealth: Payer: Self-pay

## 2015-06-28 LAB — TESTOSTERONE: TESTOSTERONE: 436 ng/dL (ref 348–1197)

## 2015-06-28 NOTE — Telephone Encounter (Signed)
Spoke with pt in reference to testosterone results. Pt voiced understanding stating he will double check with Endocrinologist about dosage.

## 2015-06-28 NOTE — Telephone Encounter (Signed)
-----   Message from Harle BattiestShannon A McGowan, PA-C sent at 06/28/2015  8:06 AM EDT ----- Patient's testosterone level is good.  If he feels his urinary symptoms are improved, I would continue just one pump daily if this is okay with his endocrinologist.

## 2015-08-10 ENCOUNTER — Telehealth: Payer: Self-pay | Admitting: *Deleted

## 2015-08-10 ENCOUNTER — Other Ambulatory Visit: Payer: Self-pay | Admitting: Urology

## 2015-08-10 DIAGNOSIS — R350 Frequency of micturition: Secondary | ICD-10-CM

## 2015-08-10 DIAGNOSIS — N393 Stress incontinence (female) (male): Secondary | ICD-10-CM

## 2015-08-10 HISTORY — DX: Stress incontinence (female) (male): N39.3

## 2015-08-10 NOTE — Telephone Encounter (Signed)
Patient called to say that Dr. Edwyna Shell was supposed to set him up with pelvic floor physical therapy and that the [address] at the hospital was the best. Patient states he guessed it was overlooked to Dr. Edwyna Shell retiring. Patient states this is the third time he has called about it and that something needs to be done cause he urinates all the time. Let patient know I would send Carollee Herter a message and that when she responds I will get back with him. Patient ok with plan.

## 2015-08-10 NOTE — Telephone Encounter (Signed)
Done

## 2015-08-10 NOTE — Telephone Encounter (Signed)
LMOM for patient to call office. 

## 2015-08-11 NOTE — Telephone Encounter (Signed)
Spoke with patient and let him know the referral was made for the physical therapy and that someone will give him a call from that office to set up the appointments after insurance verification. Patient ok with plan.

## 2015-09-10 ENCOUNTER — Emergency Department
Admission: EM | Admit: 2015-09-10 | Discharge: 2015-09-10 | Disposition: A | Discharge status: Home / Self Care | Payer: Medicare Other | Attending: Emergency Medicine | Admitting: Emergency Medicine

## 2015-09-10 ENCOUNTER — Emergency Department: Payer: Medicare Other

## 2015-09-10 DIAGNOSIS — S92531A Displaced fracture of distal phalanx of right lesser toe(s), initial encounter for closed fracture: Secondary | ICD-10-CM | POA: Insufficient documentation

## 2015-09-10 DIAGNOSIS — W2203XA Walked into furniture, initial encounter: Secondary | ICD-10-CM | POA: Insufficient documentation

## 2015-09-10 DIAGNOSIS — S90121A Contusion of right lesser toe(s) without damage to nail, initial encounter: Secondary | ICD-10-CM

## 2015-09-10 DIAGNOSIS — Y9389 Activity, other specified: Secondary | ICD-10-CM | POA: Insufficient documentation

## 2015-09-10 DIAGNOSIS — Y929 Unspecified place or not applicable: Secondary | ICD-10-CM | POA: Insufficient documentation

## 2015-09-10 DIAGNOSIS — S92911A Unspecified fracture of right toe(s), initial encounter for closed fracture: Secondary | ICD-10-CM

## 2015-09-10 DIAGNOSIS — Y999 Unspecified external cause status: Secondary | ICD-10-CM | POA: Insufficient documentation

## 2015-09-10 DIAGNOSIS — S99921A Unspecified injury of right foot, initial encounter: Secondary | ICD-10-CM | POA: Diagnosis present

## 2015-09-10 MED ORDER — BACITRACIN ZINC 500 UNIT/GM EX OINT
TOPICAL_OINTMENT | Freq: Once | CUTANEOUS | Status: AC
  Administered 2015-09-10: 1 via TOPICAL
  Filled 2015-09-10: qty 0.9

## 2015-09-10 MED ORDER — TETANUS-DIPHTH-ACELL PERTUSSIS 5-2.5-18.5 LF-MCG/0.5 IM SUSP: 0.5000 mL | Freq: Once | INTRAMUSCULAR | Status: DC

## 2015-09-10 NOTE — ED Notes (Signed)
2nd toe ointment applied and wrapped with gauze.

## 2015-09-10 NOTE — ED Notes (Signed)
Refused hard shoe and tdap.

## 2015-09-10 NOTE — ED Notes (Signed)
Pt reports he was riding on his motorized wheelchair and hit his right foot on the couch. Pt states the skin pulled back not sure if it needs stichtes.Pt states it was bleeding to his right 2nd toes. Noted gauze in place and no bleeding through dressing at this time. Pt has deformity to right foot and reports it looks like it normally looks. Pt has discoloration to bilateral legs due to poor circulation. Pt has good pulses. No distress noted. Will continue to monitor.

## 2015-09-10 NOTE — ED Notes (Signed)
Discharge instructions reviewed with patient. Questions fielded by this RN. Patient verbalizes understanding of instructions. Patient discharged home in stable condition per [place] MD . No acute distress noted at time of discharge.   

## 2015-09-10 NOTE — Discharge Instructions (Signed)
It is likely that you may have broken the tip of your second right toe.  Fortunately there is no deep laceration and we believe the blood was coming from underneath the toenail.  The toenail will likely fall off on its own.  We recommend the use the hard soled shoe that we provided, apply bacitracin ointment to the injury and change the clean gauze twice daily, and follow up with podiatry at the next available opportunity.

## 2015-09-10 NOTE — ED Triage Notes (Signed)
Pt to triage via wheelchair. Pt reports he was riding on his motorized wheelchair and hit his right foot on the couch. Bleeding to his right 2nd toes. Dressing applied. Pt has deformed foot and reports it looks like it normally looks except for the bleeding on the toe.

## 2015-09-10 NOTE — ED Provider Notes (Signed)
[facility] Emergency Department Provider Note  ____________________________________________   First MD Initiated Contact with Patient 09/10/15 [phone]     (approximate)  I have reviewed the triage vital signs and the nursing notes.   HISTORY  Chief Complaint Foot Pain    HPI Dustin Turner is a 80 y.o. male who presents for evaluation of acute onset pain and bleeding from right 2nd toe after banging it against a sofa.  He is ambulatory but was using his motorized wheelchair and accidentally hit his toe on edge of the sofa.  Acute onset moderate pain and bleeding from the toe, believes he lacerated the tip of it.  No other injuries.  Movement makes pain worse, better with rest.  Does not know the date of his last tetanus shot.  Bleeding currently well controlled.  He does state that his foot is chronically deformed and that he has seen multiple for doctors in the past.   Past Medical History:  Diagnosis Date  . Arthritis   . BPH (benign prostatic hyperplasia)   . DDD (degenerative disc disease), cervical   . Hypogonadism in male   . Insomnia   . Neurogenic bladder   . OP (osteoporosis)   . Scoliosis   . Spinal stenosis   . Urinary urgency   . Venous stasis     Patient Active Problem List   Diagnosis Date Noted  . Urinary urgency 05/27/2015  . Hypogonadism in male 05/27/2015  . Urinary frequency 04/15/2015  . BPH with obstruction/lower urinary tract symptoms 04/15/2015  . Abdominal pain 07/23/2014  . Abnormal gait 07/23/2014  . Absolute anemia 07/23/2014  . Cognitive disorder 07/23/2014  . Narrowing of intervertebral disc space 07/23/2014  . Clinical depression 07/23/2014  . Acid reflux 07/23/2014  . Abdominal hernia 07/23/2014  . HLD (hyperlipidemia) 07/23/2014  . BP (high blood pressure) 07/23/2014  . Amnesia 07/23/2014  . 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 Scoliosis 07/23/2014  . Compressed spine fracture (HCC) 07/23/2014  . Spinal  stenosis 07/23/2014  . Facial numbness 06/03/2014  . Spasm 04/09/2014  . Amnestic syndrome, drug-induced (HCC) 12/08/2013  . Obstructive apnea 12/08/2013  . Generalized OA 11/19/2013  . Bad memory 11/19/2013  . Foot pain 09/04/2013  . Polypharmacy 08/20/2013  . 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 Abnormal finding on thyroid function test 07/30/2013  . Daytime somnolence 07/30/2013  . Encounter for screening for lipoid disorders 07/30/2013  . OP (osteoporosis) 07/30/2013  . Chronic pain 06/29/2013  . Cannot sleep 06/29/2013  . Dorsopathy 06/29/2013  . Dermatitis, stasis 06/29/2013  . Blue color skin 06/05/2013  . Excessive falling 06/05/2013  . Able to mobilize using mobility aids 06/05/2013  . Curvature of spine 10/10/2012  . Acquired deformities of spine 10/10/2012  . 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 FOM (frequency of micturition) 08/21/2011  . Cervical spondylosis without myelopathy 05/03/2011  . Cervical osteoarthritis 05/03/2011  . DDD (degenerative disc disease), lumbosacral 05/03/2011  . Angulation of spine 05/03/2011  . LBP (low back pain) 05/03/2011  . Degenerative arthritis of lumbar spine 05/03/2011  . Lumbosacral spondylosis 05/03/2011  . Back pain, chronic 03/29/2011  . Benign fibroma of prostate 03/29/2011  . Urgency of micturation 03/29/2011  . Hernia, incisional 08/21/2010  . Exomphalos 08/21/2010  . Arthritis 08/07/2010  . CN (constipation) 08/07/2010  . Exposure to radiation 03/15/1953    Past Surgical History:  Procedure Laterality Date  . CERVICAL SPINE SURGERY    . HERNIA  REPAIR    . JOINT REPLACEMENT    . LUMBAR SPINE SURGERY    . Proleive System Treatment  2006  . ROTATOR CUFF REPAIR    . THORACIC SPINE SURGERY      Prior to Admission medications   Medication Sig Start Date End Date Taking? Authorizing Provider  alendronate (FOSAMAX) 70 MG tablet  07/17/14   Historical Provider, MD  ammonium lactate  (LAC-HYDRIN) 12 % lotion Apply topically. 04/12/14   Historical Provider, MD  Ascorbic Acid (VITAMIN C) 1000 MG tablet Take 460 mg by mouth.    Historical Provider, MD  Biotin (SUPER BIOTIN) 5 MG TABS 10,000 mg.    Historical Provider, MD  calcium-vitamin D (CALCIUM 500/D) 500-200 MG-UNIT per tablet Take by mouth.    Historical Provider, MD  Chlorzoxazone (LORZONE) 750 MG TABS Take by mouth. Reported on 04/11/2015    Historical Provider, MD  Cholecalciferol (D 400) 400 units CHEW Chew by mouth. Reported on 04/11/2015    Historical Provider, MD  ciprofloxacin (CILOXAN) 0.3 % ophthalmic solution Reported on 05/10/2015 07/02/14   Historical Provider, MD  cycloSPORINE (RESTASIS) 0.05 % ophthalmic emulsion  11/08/13   Historical Provider, MD  diclofenac sodium (VOLTAREN) 1 % GEL Apply topically.    Historical Provider, MD  donepezil (ARICEPT) 10 MG tablet 10 mg. 05/04/13   Historical Provider, MD  erythromycin ophthalmic ointment Reported on 05/10/2015 07/15/14   Historical Provider, MD  Eszopiclone 3 MG TABS Reported on 04/11/2015 06/11/14   Historical Provider, MD  fentaNYL (ACTIQ) 800 MCG lollipop Reported on 04/11/2015 04/27/13   Historical Provider, MD  Fish Oil-Cholecalciferol (FISH OIL + D3) 1000-1000 MG-UNIT CAPS Take by mouth.    Historical Provider, MD  gabapentin (NEURONTIN) 400 MG capsule Take 400 mg by mouth.    Historical Provider, MD  HYDROmorphone, PF, (DILAUDID HP) 250 MG injection Inject 10 mg as directed continuously.     Historical Provider, MD  ibuprofen (ADVIL,MOTRIN) 800 MG tablet Take 800 mg by mouth.    Historical Provider, MD  Insulin Pen Needle (PEN NEEDLES 31GX5/16") 31G X 8 MM MISC 1 each by Does not apply route. 04/20/11   Historical Provider, MD  L-Methylfolate-Algae-B12-B6 Latrelle Dodrill) 3-90.314-2-35 MG CAPS Reported on 04/11/2015 11/18/12   Historical Provider, MD  Karlene Einstein 290 MCG CAPS capsule  06/12/14   Historical Provider, MD  magnesium hydroxide (MILK OF MAGNESIA) 400 MG/5ML suspension  Take by mouth. Reported on 04/11/2015    Historical Provider, MD  memantine (NAMENDA) 10 MG tablet Take by mouth.    Historical Provider, MD  methylphenidate (RITALIN) 10 MG tablet Take by mouth.    Historical Provider, MD  mexiletine (MEXITIL) 200 MG capsule Reported on 04/11/2015 05/17/14   Historical Provider, MD  mirabegron ER (MYRBETRIQ) 50 MG TB24 tablet Take 1 tablet (50 mg total) by mouth daily. 04/11/15   Harle Battiest, PA-C  MOVANTIK 12.5 MG TABS tablet  05/30/14   Historical Provider, MD  naproxen (NAPROXEN DR) 500 MG EC tablet Take by mouth. 10/29/12   Historical Provider, MD  NARCAN 4 MG/0.1ML LIQD Reported on 04/11/2015 06/30/14   Historical Provider, MD  oxyCODONE (OXYCONTIN) 40 mg 12 hr tablet Take by mouth. Reported on 04/11/2015    Historical Provider, MD  Testosterone 20.25 MG/ACT (1.62%) GEL Place onto the skin. 10/03/12   Historical Provider, MD  triamcinolone cream (KENALOG) 0.1 % apply to affected area twice a day UNTIL IMPROVED 05/05/14   Historical Provider, MD  vitamin B-12 (CYANOCOBALAMIN) 1000 MCG tablet  Take by mouth.    Historical Provider, MD    Allergies Amitriptyline and Nortriptyline  Family History  Problem Relation Age of Onset  . Diabetes Father   . Diabetes Brother   . Stroke Mother   . Stroke Father   . Alcohol abuse Father   . Alcohol abuse Brother   . Kidney disease Neg Hx   . Prostate cancer Neg Hx     Social History Social History  Substance Use Topics  . Smoking status: Never Smoker  . Smokeless tobacco: Not on file  . Alcohol use No    Review of Systems Cardiovascular: Denies chest pain. Respiratory: Denies shortness of breath. Gastrointestinal: No abdominal pain.  No nausea, no vomiting.  No diarrhea.  No constipation. Musculoskeletal: Negative for back pain. Negative for neck pain.  Pain in the right second toe. Skin: Negative for rash.  Laceration to the right second toe  ____________________________________________   PHYSICAL  EXAM:  VITAL SIGNS: ED Triage Vitals  Enc Vitals Group     BP 09/10/15 0158 139/72     Pulse Rate 09/10/15 0158 61     Resp 09/10/15 0158 18     Temp 09/10/15 0158 98 F (36.7 C)     Temp Source 09/10/15 0158 Oral     SpO2 09/10/15 0158 98 %     Weight 09/10/15 0159 168 lb (76.2 kg)     Height 09/10/15 0159 5\' 7"  (1.702 m)     Head Circumference --      Peak Flow --      Pain Score 09/10/15 0159 7     Pain Loc --      Pain Edu? --      Excl. in GC? --     Constitutional: Alert and oriented. Well appearing and in no acute distress. Head: Atraumatic. Cardiovascular: Normal rate, regular rhythm. Good peripheral circulation.  Respiratory: Normal respiratory effort.  No retractions.  Musculoskeletal: Stents of chronic deformities of the right foot.  His right second toe does appear deformed as well but he states that it is a similar shape to normal.  There is blood all around the nail and it appears that the nail was partially peeled back but it is stable at this point.  There is no other laceration noted and no additional bleeding.  It is mildly tender to palpation throughout the distal phalanx. Neurologic:  Normal speech and language. No gross focal neurologic deficits are appreciated.  Skin:  Skin is warm, dry and intact Except as described above. No rash noted. Psychiatric: Mood and affect are normal. Speech and behavior are normal.  ____________________________________________   LABS (all labs ordered are listed, but only abnormal results are displayed)  Labs Reviewed - No data to display ____________________________________________  EKG  None - EKG not ordered by ED physician ____________________________________________  RADIOLOGY   Dg Foot Complete Right  Result Date: 09/10/2015 CLINICAL DATA:  Right foot pain and bleeding. Injured foot while riding motorized wheelchair, striking foot on couch. Bleeding to second toe. EXAM: RIGHT FOOT COMPLETE - 3+ VIEW COMPARISON:   None. FINDINGS: Chronic deformity of the great toe with medial subluxation and angulation of the digit with respect to the metatarsal. Associated chronic subchondral cystic change. The second, third, and fourth digits are medially angulated to lesser extent. Lucency through the second toe distal phalanx is seen only on a single view and equivocal for fracture. No additional acute abnormality of the foot. IMPRESSION: 1. Equivocal for fracture of  the second toe distal phalanx. 2. Chronic appearing subluxation and angulation of the first through fourth digits with prominent degenerative change at the first metatarsal phalangeal joint. Electronically Signed   By: Rubye Oaks M.D.   On: 09/10/2015 02:52    ____________________________________________   PROCEDURES  Procedure(s) performed:   Procedures   Critical Care performed: No ____________________________________________   INITIAL IMPRESSION / ASSESSMENT AND PLAN / ED COURSE  Pertinent labs & imaging results that were available during my care of the patient were reviewed by me and considered in my medical decision making (see chart for details).  Probable distal phalanx fracture in the setting of multiple chronic deformities of the affected foot.  I gave him a Tdap vaccination and we placed bacitracin on the wound and place it in clean gauze.  Hard sole shoe and a tree follow-up.  Patient and son-in-law agree with the plan.    ____________________________________________  FINAL CLINICAL IMPRESSION(S) / ED DIAGNOSES  Final diagnoses:  Closed fracture of right toe, initial encounter  Toe contusion, right, initial encounter     MEDICATIONS GIVEN DURING THIS VISIT:  Medications  Tdap (BOOSTRIX) injection 0.5 mL (not administered)  bacitracin ointment (not administered)     NEW OUTPATIENT MEDICATIONS STARTED DURING THIS VISIT:  New Prescriptions   No medications on file      Note:  This document was prepared using  Dragon voice recognition software and may include unintentional dictation errors.    Loleta Rose, MD 09/10/15 [phone]

## 2015-09-26 ENCOUNTER — Ambulatory Visit: Payer: Medicare Other | Attending: Urology | Admitting: Physical Therapy

## 2015-09-26 DIAGNOSIS — R278 Other lack of coordination: Secondary | ICD-10-CM | POA: Insufficient documentation

## 2015-09-26 DIAGNOSIS — R2689 Other abnormalities of gait and mobility: Secondary | ICD-10-CM | POA: Diagnosis not present

## 2015-09-26 DIAGNOSIS — Z9181 History of falling: Secondary | ICD-10-CM | POA: Insufficient documentation

## 2015-09-26 DIAGNOSIS — R2681 Unsteadiness on feet: Secondary | ICD-10-CM | POA: Diagnosis present

## 2015-09-26 NOTE — Patient Instructions (Addendum)
Log rolling  out of bed  ___________  Trying a nap in the afternoon after lunch 20 naps max.  Use CPAP machine while napping. Gradual transitions from relaxation with several coordination breaths>  ___________   Bonita QuinYou are now ready to begin training the deep core muscles system: diaphragm, transverse abdominis, pelvic floor . These muscles must work together as a team.       The key to these exercises to train the brain to coordinate the timing of these muscles and to have them turn on for long periods of time to hold you upright against gravity (especially important if you are on your feet all day).These muscles are postural muscles and play a role stabilizing your spine and bodyweight. By doing these repetitions slowly and correctly instead of doing crunches, you will achieve a flatter belly without a lower pooch. You are also placing your spine in a more neutral position and breathing properly which in turn, decreases your risk for problems related to your pelvic floor, abdominal, and low back such as pelvic organ prolapse, hernias, diastasis recti (separation of superficial muscles), disk herniations, spinal fractures. These exercises set a solid foundation for you to later progress to resistance/ strength training with therabands and weights and return to other typical fitness exercises with a stronger deeper core.  Coordination breathing for relaxation in the morning, and before)  Do level 1 (10 reps inhale -1-2-3 pause, exhale 3-2-1 pause) lengthened breath is key to relaxation

## 2015-09-27 NOTE — Therapy (Addendum)
[facility] MAIN [facility] SERVICES [address] [place], [place], [address] Phone: [phone]   Fax:  [phone]  Physical Therapy Evaluation  Patient Details  Name: Dustin Turner MRN: [id number] Date of Birth: 02-Oct-1935 Referring Provider: Marvel Plan, MD  Encounter Date: 09/26/2015      PT End of Session - 09/27/15 2345    Visit Number 1   Number of Visits 12   Date for PT Re-Evaluation 12/19/15   Authorization Type g code   PT Start Time 1505   PT Stop Time 1630   PT Time Calculation (min) 85 min   Activity Tolerance Patient tolerated treatment well;No increased pain   Behavior During Therapy WFL for tasks assessed/performed      Past Medical History:  Diagnosis Date  . Arthritis   . BPH (benign prostatic hyperplasia)   . DDD (degenerative disc disease), cervical   . Hypogonadism in male   . Insomnia   . Neurogenic bladder   . OP (osteoporosis)   . Scoliosis   . Spinal stenosis   . Urinary urgency   . Venous stasis     Past Surgical History:  Procedure Laterality Date  . CERVICAL SPINE SURGERY    . HERNIA REPAIR    . JOINT REPLACEMENT    . LUMBAR SPINE SURGERY    . Proleive System Treatment  2006  . ROTATOR CUFF REPAIR    . THORACIC SPINE SURGERY      There were no vitals filed for this visit.       Subjective Assessment - 09/27/15 2352    Subjective Pt reports multiple urinary dysfunctions. Pt reports  urinary frequency occurs 3-6 x/ night only while urinary urgency was an issue during day and night. He reports wearing urinary pads for the past month has decreased accidents to 3 per month.  Pt also reports having difficulty with initiation but pt has been using an abdominal technique which has helped by 70%.  At night this is issue occurs 30% of the time. Denied urinary leakage. These issues have worsened over the past 10 years but more pronounced the last 3 years. Pt is compliant with his CPAP machine since Dx of  Excessive Daytime Drowsiness (Mixed).  Pt experiences 3 falls/ month in the bathroom with a majority of the time, the falls occurring after urinating and using the scooter back to the bedroom.  Pt r uses the scooter to minimize falls and a seatbelt on the toilet.  Hx of insomnia. Pt retired from a position that required a lot responsibility which caused anxiety.   Pt was accompanied by dtr and grandson.    Pertinent History OCD, Neuropathy, scoiliosis, pain pump located at R abdomen, Spinal Cord stimulator, 6 spinal surgeries, L  inguinal hernia 2/2 implantation of the pain pump.  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  Physical activity: resistance bands    Patient Stated Goals get up less at night, sleep better,             [facility] PT Assessment - 09/27/15 2333      Assessment   Medical Diagnosis urinary incontinence   Referring Provider McGowan, MD     Precautions   Precautions None     Restrictions   Weight Bearing Restrictions No     Balance Screen   Has the patient fallen in the past 6 months Yes   How many times? 3x within a month to excessive daytime excessive drowsiness (  detailed in sibjective)      Home Environment   Additional Comments lives with wife, daughter lives 1 mile away and helps with medications administration     Prior Function   Level of Independence Independent;Independent with community mobility with device  uses a wooden walking pole     Observation/Other Assessments   Observations 19 cm earlob to auditory meatus   severe forward head, thoracic kyphosis   Other Surveys  --  NIH-CPSI 49% , PSQI 71%      Coordination   Gross Motor Movements are Fluid and Coordinated --  diaphragmatic breathing present, short, quick exhalations     Posture/Postural Control   Posture Comments dyscoordination of pelvic floor due to chest breathing     Palpation   Spinal mobility R thoracic scoliotic curve    Palpation comment spinal scar adhesions      Bed Mobility   Bed Mobility --  half crunch     Ambulation/Gait   Assistive device Straight cane   Gait velocity .74 m/s                    [facility] Adult PT Treatment/Exercise - 09/27/15 2343      Therapeutic Activites    Therapeutic Activities --  see pt instructions     Neuro Re-ed    Neuro Re-ed Details  see pt instructions  (proper pelvic floor and diaphragmatic coordination) paced breathing                 PT Education - 09/27/15 2344    Education provided Yes   Education Details POC, anatomy/phsyiology/ goals, biopsychosocial approaches   Person(s) Educated Patient;Child(ren)  dtr, grandson    Methods Explanation;Demonstration;Tactile cues;Verbal cues;Handout   Comprehension Returned demonstration;Verbalized understanding;Verbal cues required;Tactile cues required             PT Long Term Goals - 09/26/15 1554      PT LONG TERM GOAL #1   Title Pt and pt's dtr will report the time to get ready for bed within < 1.5 hrs in order to signify decreased falling asleep.    Time 12   Period Weeks   Status New     PT LONG TERM GOAL #2   Title Pt will report being to initiate urine during day/night with less 5-10 min   in order to participate in social events   Time 12   Period Weeks   Status New     PT LONG TERM GOAL #3   Title Pt will be able to decrease his use of his urinal in car by 50% of the time in order to travel.    Time 12   Period Weeks   Status New     PT LONG TERM GOAL #4   Title Pt will decrease his score on NIH-CPSI from 49% to < 44% in order to demo improved ability to urinate will less difficulty.    Time 12   Period Weeks   Status New     PT LONG TERM GOAL #5   Title Pt will demo decreased PSQI from 71% to < 61% in order to demo improved QOL   Time 12   Period Weeks   Status New               Plan - 09/27/15 2345    Clinical Impression Statement Pt is a 80 yo male who c/o urinary frequency, urgency, and  difficulty with urination. Pt's clinical presentations  impact his pelvic floor function include: dyscoordination of pelvic floor muscles, limited diaphragmatic excursion with limited pelvic floor lengthening. In addition, his  posture deficits include such as severe scoliosis, forward head, decreased cervical/ lumbar curves 2/2 multiple spinal surgeries. His limited spinal mobility compounded with sleep disorder (excessive daytime drowsiness) and anxiety/ OCD also impact his frequent falls related to nighttime voiding.  Following Tx today, pt demo'd proper breathing technique with coordination of pelvic floor mm. Pt and dtr voiced understanding on relaxation strategies to facilitate improved circadian cycle regulation . PT deferred pt and dtr to speak to his MD re: their question about medication intake.       Rehab Potential Good   Clinical Impairments Affecting Rehab Potential scoliosis, multiple surgeries, psychological factors (anxiety, OCD), sleep disorder, Hx of alcohol abuse,    PT Frequency 1x / week   PT Duration 12 weeks   PT Treatment/Interventions ADLs/Self Care Home Management;Aquatic Therapy;Moist Heat;Traction;Gait training;Stair training;Functional mobility training;Neuromuscular re-education;Balance training;Therapeutic activities;Patient/family education;Manual lymph drainage;Scar mobilization;Therapeutic exercise;Cryotherapy;Energy conservation;Other (comment)  relaxation techniques   Consulted and Agree with Plan of Care Patient      Patient will benefit from skilled therapeutic intervention in order to improve the following deficits and impairments:  Abnormal gait, Decreased activity tolerance, Decreased coordination, Decreased safety awareness, Decreased strength, Postural dysfunction, Improper body mechanics, Decreased scar mobility, Decreased range of motion, Decreased mobility, Decreased balance, Hypomobility  Visit Diagnosis: Other abnormalities of gait and mobility  Other  lack of coordination      G-Codes - September 27, 2015 2350    Functional Assessment Tool Used NIH-CPSI 49% , PSQI 71% , clinical judgement   Functional Limitation Self care   Self Care Current Status (B1478) At least 60 percent but less than 80 percent impaired, limited or restricted   Self Care Goal Status (G9562) At least 20 percent but less than 40 percent impaired, limited or restricted       Problem List Patient Active Problem List   Diagnosis Date Noted  . Urinary urgency 05/27/2015  . Hypogonadism in male 05/27/2015  . Urinary frequency 04/15/2015  . BPH with obstruction/lower urinary tract symptoms 04/15/2015  . Abdominal pain 07/23/2014  . Abnormal gait 07/23/2014  . Absolute anemia 07/23/2014  . Cognitive disorder 07/23/2014  . Narrowing of intervertebral disc space 07/23/2014  . Clinical depression 07/23/2014  . Acid reflux 07/23/2014  . Abdominal hernia 07/23/2014  . HLD (hyperlipidemia) 07/23/2014  . BP (high blood pressure) 07/23/2014  . Amnesia 07/23/2014  . 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 Scoliosis 07/23/2014  . Compressed spine fracture (HCC) 07/23/2014  . Spinal stenosis 07/23/2014  . Facial numbness 06/03/2014  . Spasm 04/09/2014  . Amnestic syndrome, drug-induced (HCC) 12/08/2013  . Obstructive apnea 12/08/2013  . Generalized OA 11/19/2013  . Bad memory 11/19/2013  . Foot pain 09/04/2013  . Polypharmacy 08/20/2013  . 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 Abnormal finding on thyroid function test 07/30/2013  . Daytime somnolence 07/30/2013  . Encounter for screening for lipoid disorders 07/30/2013  . OP (osteoporosis) 07/30/2013  . Chronic pain 06/29/2013  . Cannot sleep 06/29/2013  . Dorsopathy 06/29/2013  . Dermatitis, stasis 06/29/2013  . Blue color skin 06/05/2013  . Excessive falling 06/05/2013  . Able to mobilize using mobility aids 06/05/2013  . Curvature of spine 10/10/2012  . Acquired deformities of spine 10/10/2012  .  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 FOM (frequency of micturition) 08/21/2011  . Cervical spondylosis without myelopathy 05/03/2011  .  Cervical osteoarthritis 05/03/2011  . DDD (degenerative disc disease), lumbosacral 05/03/2011  . Angulation of spine 05/03/2011  . LBP (low back pain) 05/03/2011  . Degenerative arthritis of lumbar spine 05/03/2011  . Lumbosacral spondylosis 05/03/2011  . Back pain, chronic 03/29/2011  . Benign fibroma of prostate 03/29/2011  . Urgency of micturation 03/29/2011  . Hernia, incisional 08/21/2010  . Exomphalos 08/21/2010  . Arthritis 08/07/2010  . CN (constipation) 08/07/2010  . Exposure to radiation 03/15/1953    Mariane MastersYeung,Shin Yiing ,PT, DPT, E-RYT  09/27/2015, 11:52 PM  [facility] [address] [place], [place], [address] Phone: [phone]   Fax:  [phone]  Name: Dustin Turner MRN: [id number] Date of Birth: 01/12/1936

## 2015-10-04 ENCOUNTER — Encounter: Payer: Medicare Other | Admitting: Physical Therapy

## 2015-10-10 ENCOUNTER — Ambulatory Visit: Payer: Medicare Other

## 2015-10-10 ENCOUNTER — Ambulatory Visit: Payer: Medicare Other | Admitting: Physical Therapy

## 2015-10-10 DIAGNOSIS — R2689 Other abnormalities of gait and mobility: Secondary | ICD-10-CM | POA: Diagnosis not present

## 2015-10-10 DIAGNOSIS — R2681 Unsteadiness on feet: Secondary | ICD-10-CM

## 2015-10-10 DIAGNOSIS — Z9181 History of falling: Secondary | ICD-10-CM

## 2015-10-10 DIAGNOSIS — R278 Other lack of coordination: Secondary | ICD-10-CM

## 2015-10-10 NOTE — Patient Instructions (Addendum)
During the day:  Exercises for scoliosis:  Flexibility for left side: Raise Left arm overhead and arch over like a rainbow (side bend Right)  10 reps  X 2     To increase opposite rotation of the curve Yellow band under Right Foot  Left hand holding band over Right thigh,  Inhale do nothing Exhale pull band across from Right thigh to turn trunk left direction like "pulling a lawn mower"   __________________________  Thomasenia BottomsURGE SUPPRESION TECHNIQUES  ? These techniques are to be used to suppress those abnormally strong URGES to urinate, especially after you have already urinated < 1hr ago.  ? These steps do not have to be followed in order, and not all steps have to be used.   ? The purpose of these steps is to help you regain control of your bladder, to reduce the amount of urinary urgency, frequency, or leaking.  ? They take practice to master in controlling urgency.  Allow yourself to be okay with leaking when you first start practicing these steps. ? Practice these steps first at home, when you do not have to worry as much about leaking.  1. SIT DOWN.  Pressure on the pelvic floor inhibits the bladder.  Further pressure may help, such as sitting on a small rolled up towel.  2. LIGHT "KEGELS" using  imagery.  Breathe in, feel pelvic floor lower to "hammock"  level by the end of the inhalation. Exhale, feel pelvic floor gradually move up to "like an umbrella" .  At the end of exhalation, feel pelvic floor lift higher at which you will perform a quick light squeeze (the muscles that hold back gas and urine).  Do not do hard, maximal contractions, as this will quickly fatigue the muscles and cause leakage.          Perform this 5 repetitions in this pattern.   3. BREATHE & STAY CALM.  Breathing slowly and remaining calm will inhibit your sympathetic nervous system, which will in turn calm the bladder.   4. DISTRACTION.  Sit with a project that will engage your mind.  Anything that works for  you - say a prayer  5.  6. VISUALIZATION.  Imagine that you are in a place/situation in which either you cannot or do not want to leave.  Examples: in a car and cannot stop; lying on a beach with a far walk to a restroom; at dinner with someone special.  If the urge persists after practicing these steps and feel you must go to the bathroom, then it is imperative that you . . . . ? Walk slowly and calmly to the bathroom ? Maintain calm breathing ? Refrain from undressing until you are standing over the toilet Rushing to the restroom will only encourage the strong bladder urges and leaking.  Again, the more you practice, the easier these steps will become.

## 2015-10-10 NOTE — Therapy (Signed)
[place] MAIN [facility] SERVICES [address] [place], [place], [address] Phone: [phone]   Fax:  [phone]  Physical Therapy Evaluation  Patient Details  Name: Dustin Turner MRN: [id number] Date of Birth: 02-08-1935 Referring Provider: Barbaraann Boys, MD  Encounter Date: 10/10/2015      PT End of Session - 10/10/15 1601    Visit Number 1   Number of Visits 12   Date for PT Re-Evaluation 11/21/15   Authorization Type 1 (G-code)   Authorization Time Period 10   PT Start Time 1430   PT Stop Time 1530   PT Time Calculation (min) 60 min   Equipment Utilized During Treatment Gait belt   Activity Tolerance Patient tolerated treatment well   Behavior During Therapy [facility] for tasks assessed/performed      Past Medical History:  Diagnosis Date  . Arthritis   . BPH (benign prostatic hyperplasia)   . DDD (degenerative disc disease), cervical   . Hypogonadism in male   . Insomnia   . Neurogenic bladder   . OP (osteoporosis)   . Scoliosis   . Spinal stenosis   . Urinary urgency   . Venous stasis     Past Surgical History:  Procedure Laterality Date  . CERVICAL SPINE SURGERY    . HERNIA REPAIR    . JOINT REPLACEMENT    . LUMBAR SPINE SURGERY    . Proleive System Treatment  2006  . ROTATOR CUFF REPAIR    . THORACIC SPINE SURGERY      There were no vitals filed for this visit.       Subjective Assessment - 10/10/15 1449    Subjective Pt with gait instability and frequent falling secondary to scoliosis and balance difficulty. Pt also reports difficulty with sleeping stating the lack of sleep leads to falls. States. Pt states he falls asleep when performing nightly routine of hygiene and using the restroom leading to a fall. Patient reports he has not hurt himself from any of the falls. Pt states he wakes up several times in the middle of night to use the restroom leading to decreased sleep. Pt states increased difficulty with  walking and standing for long periods of time and reports fatigue and pain with increased standing. States he used to perform Tai Chi in standing, but had to move to sitting because of the worsening pain and decreased balance. Reports increased right knee pain.    Pertinent History OCD, Neuropathy, scoiliosis, pain pump located at R abdomen, Spinal Cord stimulator, 6 spinal surgeries, L  inguinal hernia 2/2 implantation of the pain pump.  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  Physical activity: resistance bands    Limitations Sitting;Standing;Walking   Patient Stated Goals get up less at night, sleep better,    Currently in Pain? Yes   Pain Score 7    Pain Location Back   Pain Orientation Right   Pain Descriptors / Indicators Aching;Constant   Pain Type Chronic pain   Pain Onset More than a month ago   Pain Frequency Constant            [facility] PT Assessment - 10/10/15 1439      Assessment   Medical Diagnosis Balance/Gait    Referring Provider Barbaraann Boys, MD   Onset Date/Surgical Date 01/15/05   Hand Dominance Left   Next MD Visit unknown   Prior Therapy yes      Balance Screen   Has  the patient fallen in the past 6 months Yes   How many times? 50   Has the patient had a decrease in activity level because of a fear of falling?  Yes   Is the patient reluctant to leave their home because of a fear of falling?  No     Home Environment   Living Environment Private residence   Living Arrangements Spouse/significant other   Available Help at Discharge Family   Type of [place] to enter   Entrance Stairs-Number of Steps 4   Entrance Stairs-Rails Can reach both   [place] One level   [place] bars - toilet;Grab bars - tub/shower;Electric scooter;Bedside commode  Walking stick     Prior Function   Level of Independence Independent   Vocation Retired   Biomedical scientist N/A   Leisure visiting friends      Cognition   Overall Cognitive Status Within Functional Limits for tasks assessed     ROM / Strength   AROM / PROM / Strength MMT: Gross UE screen: 5/5; Gross LE screen 5/5     Transfers   Five time sit to stand comments  16.6     Ambulation/Gait   Gait Pattern --  Severe forward bend secondary to scoliosis, Decreased arm sw   Gait velocity .58 m/s     Standardized Balance Assessment   Five times sit to stand comments  16.6 sec   10 Meter Walk .58 m/s     Timed Up and Go Test   Normal TUG (seconds) 19.16     Outcome Measures:  MODI: 36% limited 3 min walk test: 324f   TREATMENT: Ambulation with walking sticks -- 4 x 574fwith emphasis on thoracic exercise and reciprocal arm swing Ambulation with crutches -- 4 x 5024fith emphasis on 2 point gait and crutch placement for safety  Sit to stands from chair with emphasis on LE positioning and proper muscular activation          PT Education - 10/10/15 1556    Education provided Yes   Education Details Educated to focus on standing up straight while ambulating    Person(s) Educated Patient   Methods Explanation;Demonstration   Comprehension Verbalized understanding;Returned demonstration             PT Long Term Goals - 10/10/15 1616      PT LONG TERM GOAL #1   Title Pt and pt's dtr will report the time to get ready for bed within < 1.5 hrs in order to signify decreased falling asleep.    Time 12   Period Weeks   Status New     PT LONG TERM GOAL #2   Title Pt will report being to initiate urine during day/night with less 5-10 min   in order to participate in social events   Time 12   Period Weeks   Status New     PT LONG TERM GOAL #3   Title Pt will be able to decrease his use of his urinal in car by 50% of the time in order to travel.    Time 12   Period Weeks   Status New     PT LONG TERM GOAL #4   Title Pt will decrease his score on NIH-CPSI from 49% to < 44% in order to demo improved ability to  urinate will less difficulty.    Time 12   Period Weeks   Status  New     PT LONG TERM GOAL #5   Title Pt will demo decreased PSQI from 71% to < 61% in order to demo improved QOL   Time 12   Period Weeks   Status New     Additional Long Term Goals   Additional Long Term Goals Yes     PT LONG TERM GOAL #6   Title Patient will score a <16 sec on the TUG to demonstrate significant improvement for decreasing fall risk   Baseline 19.16sec   Time 12   Period Weeks   Status New     PT LONG TERM GOAL #7   Title Patient will score <14sec on the five time sit to stand to demonstrate significant improvement in LE strength and improved ability to transfer from sitting to standing   Baseline 16.6 sec   Time 12   Period Weeks   Status New     PT LONG TERM GOAL #8   Title Patient will score >34ms on the 10 m walk test to more safely ambulate across the street   Baseline .58 m/s   Time 12   Period Weeks   Status New               Plan - 010/24/171625    Clinical Impression Statement Pt is a 80yo left hand dominant male experiencing increased instability on his feet and frequent falling secondary to significant scoliotic curve and decreased balance. Decreased balance is indicated by decreased scores on his 5xSTS, 155malk test, and TUG measures. Patient also is also experiencing increased back pain as indicated my an increased MODI exam. Pt will benefit from PT intervention to improve dynamic/static balance to decrease fall risk and improve stability.    Rehab Potential Good   Clinical Impairments Affecting Rehab Potential scoliosis, multiple surgeries, psychological factors (anxiety, OCD), sleep disorder   PT Frequency 1x / week   PT Duration 12 weeks   PT Treatment/Interventions ADLs/Self Care Home Management;Aquatic Therapy;Moist Heat;Traction;Gait training;Stair training;Functional mobility training;Neuromuscular re-education;Balance training;Therapeutic activities;Patient/family  education;Manual lymph drainage;Scar mobilization;Therapeutic exercise;Cryotherapy;Energy conservation;Other (comment)  relaxation techniques   PT Next Visit Plan Static and dynamic balance interventions   PT Home Exercise Plan Ambulating with walking sticks    Consulted and Agree with Plan of Care Patient      Patient will benefit from skilled therapeutic intervention in order to improve the following deficits and impairments:  Abnormal gait, Decreased activity tolerance, Decreased coordination, Decreased safety awareness, Decreased strength, Postural dysfunction, Improper body mechanics, Decreased scar mobility, Decreased range of motion, Decreased mobility, Decreased balance, Hypomobility, Pain  Visit Diagnosis: Unsteadiness on feet  History of falling      G-Codes - 09October 24, 2017730    Functional Assessment Tool Used TUG: 19.16sec, 1017mlk: .67m14mclinical judgement   Functional Limitation Mobility: Walking and moving around   Mobility: Walking and Moving Around Current Status ([id number] least 40 percent but less than 60 percent impaired, limited or restricted   Mobility: Walking and Moving Around Goal Status ([id number] least 20 percent but less than 40 percent impaired, limited or restricted       Problem List Patient Active Problem List   Diagnosis Date Noted  . Urinary urgency 05/27/2015  . Hypogonadism in male 05/27/2015  . Urinary frequency 04/15/2015  . BPH with obstruction/lower urinary tract symptoms 04/15/2015  . Abdominal pain 07/23/2014  . Abnormal gait 07/23/2014  . Absolute anemia 07/23/2014  . Cognitive disorder 07/23/2014  .  Narrowing of intervertebral disc space 07/23/2014  . Clinical depression 07/23/2014  . Acid reflux 07/23/2014  . Abdominal hernia 07/23/2014  . HLD (hyperlipidemia) 07/23/2014  . BP (high blood pressure) 07/23/2014  . Amnesia 07/23/2014  . 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 Scoliosis 07/23/2014  . Compressed spine fracture ([place])  07/23/2014  . Spinal stenosis 07/23/2014  . Facial numbness 06/03/2014  . Spasm 04/09/2014  . Amnestic syndrome, drug-induced ([place]) 12/08/2013  . Obstructive apnea 12/08/2013  . Generalized OA 11/19/2013  . Bad memory 11/19/2013  . Foot pain 09/04/2013  . Polypharmacy 08/20/2013  . 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 Abnormal finding on thyroid function test 07/30/2013  . Daytime somnolence 07/30/2013  . Encounter for screening for lipoid disorders 07/30/2013  . OP (osteoporosis) 07/30/2013  . Chronic pain 06/29/2013  . Cannot sleep 06/29/2013  . Dorsopathy 06/29/2013  . Dermatitis, stasis 06/29/2013  . Blue color skin 06/05/2013  . Excessive falling 06/05/2013  . Able to mobilize using mobility aids 06/05/2013  . Curvature of spine 10/10/2012  . Acquired deformities of spine 10/10/2012  . 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 FOM (frequency of micturition) 08/21/2011  . Cervical spondylosis without myelopathy 05/03/2011  . Cervical osteoarthritis 05/03/2011  . DDD (degenerative disc disease), lumbosacral 05/03/2011  . Angulation of spine 05/03/2011  . LBP (low back pain) 05/03/2011  . Degenerative arthritis of lumbar spine 05/03/2011  . Lumbosacral spondylosis 05/03/2011  . Back pain, chronic 03/29/2011  . Benign fibroma of prostate 03/29/2011  . Urgency of micturation 03/29/2011  . Hernia, incisional 08/21/2010  . Exomphalos 08/21/2010  . Arthritis 08/07/2010  . CN (constipation) 08/07/2010  . Exposure to radiation 03/15/1953    Blythe Stanford, DPT PT 10/10/2015, 5:38 PM  [place] MAIN [facility] SERVICES [address] [place], [place], [address] Phone: [phone]   Fax:  [phone]  Name: Dustin Turner MRN: [id number] Date of Birth: 12-21-1935

## 2015-10-10 NOTE — Addendum Note (Signed)
Addended by: Bethanie DickerISSELL, Anthony V on: 10/10/2015 05:43 PM   Modules accepted: Orders

## 2015-10-10 NOTE — Therapy (Addendum)
[place] MAIN [facility] SERVICES [address] [place], [place], [address] Phone: [phone]   Fax:  [phone]  Physical Therapy Treatment  Patient Details  Name: Dustin Turner MRN: [id number] Date of Birth: Nov 05, 1935 Referring Provider: Ernestine Conrad, MD  Encounter Date: 10/10/2015      PT End of Session - 10/10/15 1416    Visit Number 2   Number of Visits 12   Date for PT Re-Evaluation 12/19/15   Authorization Type g code   PT Start Time 1300   PT Stop Time 1400   PT Time Calculation (min) 60 min   Activity Tolerance Patient tolerated treatment well;No increased pain   Behavior During Therapy WFL for tasks assessed/performed      Past Medical History:  Diagnosis Date  . Arthritis   . BPH (benign prostatic hyperplasia)   . DDD (degenerative disc disease), cervical   . Hypogonadism in male   . Insomnia   . Neurogenic bladder   . OP (osteoporosis)   . Scoliosis   . Spinal stenosis   . Urinary urgency   . Venous stasis     Past Surgical History:  Procedure Laterality Date  . CERVICAL SPINE SURGERY    . HERNIA REPAIR    . JOINT REPLACEMENT    . LUMBAR SPINE SURGERY    . Proleive System Treatment  2006  . ROTATOR CUFF REPAIR    . THORACIC SPINE SURGERY      There were no vitals filed for this visit.      Subjective Assessment - 10/10/15 1300    Subjective Pt reported he took 2 naps across past 2 weeks and has tried to nap for 20 min but does not fall asleep. Pt states he has been overachiever type. Pt finishes supper at 7pm, starts his routine with medications / personal hygience at 8pm.  Pt starts to fall asleep during this routine and has fallen out of his chair in front of the sink but has not hurt himself.  Pt just to do tai chi before bed which he enjoys but has not been able to due to bedtime.  Pt reports he woke up 5 x during the night with urge to urinate but 4 of the 5 times were false alarms.       Pertinent History  OCD, Neuropathy, scoiliosis, pain pump located at R abdomen, Spinal Cord stimulator, 6 spinal surgeries, L  inguinal hernia 2/2 implantation of the pain pump.  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  Physical activity: resistance bands    Patient Stated Goals get up less at night, sleep better,             [facility] PT Assessment - 10/10/15 1416      Coordination   Fine Motor Movements are Fluid and Coordinated --  excessive use of abdominal mm w/ pelvic floor contraction                     [facility] Adult PT Treatment/Exercise - 10/10/15 1416      Therapeutic Activites    Therapeutic Activities --  see pt instructions     Neuro Re-ed    Neuro Re-ed Details  deep core mm coordination, pelvic floor contraction/ urge protocol                PT Education - 10/10/15 1416    Education provided Yes   Education Details HEP   Person(s) Educated  Patient   Methods Explanation;Demonstration;Tactile cues;Verbal cues;Handout   Comprehension Returned demonstration;Verbalized understanding             PT Long Term Goals - 09/26/15 1554      PT LONG TERM GOAL #1   Title Pt and pt's dtr will report the time to get ready for bed within < 1.5 hrs in order to signify decreased falling asleep.    Time 12   Period Weeks   Status New     PT LONG TERM GOAL #2   Title Pt will report being to initiate urine during day/night with less 5-10 min   in order to participate in social events   Time 12   Period Weeks   Status New     PT LONG TERM GOAL #3   Title Pt will be able to decrease his use of his urinal in car by 50% of the time in order to travel.    Time 12   Period Weeks   Status New     PT LONG TERM GOAL #4   Title Pt will decrease his score on NIH-CPSI from 49% to < 44% in order to demo improved ability to urinate will less difficulty.    Time 12   Period Weeks   Status New     PT LONG TERM GOAL #5   Title Pt will demo decreased PSQI  from 71% to < 61% in order to demo improved QOL   Time 12   Period Weeks   Status New               Plan - 10/10/15 1417    Clinical Impression Statement Pt demo'd exercises to address scoliosis correctly. Pt also demo'd proper technique for urge protocol. PT communicated with ortho PT on pt's POC and future collaborations to optimize pt's outcomes. Pt continues to benefit from skilled PT.   Rehab Potential Good   Clinical Impairments Affecting Rehab Potential scoliosis, multiple surgeries, psychological factors (anxiety, OCD), sleep disorder   PT Frequency 1x / week   PT Duration 12 weeks   PT Treatment/Interventions ADLs/Self Care Home Management;Aquatic Therapy;Moist Heat;Traction;Gait training;Stair training;Functional mobility training;Neuromuscular re-education;Balance training;Therapeutic activities;Patient/family education;Manual lymph drainage;Scar mobilization;Therapeutic exercise;Cryotherapy;Energy conservation;Other (comment)  relaxation techniques   Consulted and Agree with Plan of Care Patient      Patient will benefit from skilled therapeutic intervention in order to improve the following deficits and impairments:  Abnormal gait, Decreased activity tolerance, Decreased coordination, Decreased safety awareness, Decreased strength, Postural dysfunction, Improper body mechanics, Decreased scar mobility, Decreased range of motion, Decreased mobility, Decreased balance, Hypomobility  Visit Diagnosis: Other abnormalities of gait and mobility  Other lack of coordination     Problem List Patient Active Problem List   Diagnosis Date Noted  . Urinary urgency 05/27/2015  . Hypogonadism in male 05/27/2015  . Urinary frequency 04/15/2015  . BPH with obstruction/lower urinary tract symptoms 04/15/2015  . Abdominal pain 07/23/2014  . Abnormal gait 07/23/2014  . Absolute anemia 07/23/2014  . Cognitive disorder 07/23/2014  . Narrowing of intervertebral disc space  07/23/2014  . Clinical depression 07/23/2014  . Acid reflux 07/23/2014  . Abdominal hernia 07/23/2014  . HLD (hyperlipidemia) 07/23/2014  . BP (high blood pressure) 07/23/2014  . Amnesia 07/23/2014  . 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 Scoliosis 07/23/2014  . Compressed spine fracture ([place]) 07/23/2014  . Spinal stenosis 07/23/2014  . Facial numbness 06/03/2014  . Spasm 04/09/2014  . Amnestic syndrome, drug-induced ([place]) 12/08/2013  .  Obstructive apnea 12/08/2013  . Generalized OA 11/19/2013  . Bad memory 11/19/2013  . Foot pain 09/04/2013  . Polypharmacy 08/20/2013  . 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 Abnormal finding on thyroid function test 07/30/2013  . Daytime somnolence 07/30/2013  . Encounter for screening for lipoid disorders 07/30/2013  . OP (osteoporosis) 07/30/2013  . Chronic pain 06/29/2013  . Cannot sleep 06/29/2013  . Dorsopathy 06/29/2013  . Dermatitis, stasis 06/29/2013  . Blue color skin 06/05/2013  . Excessive falling 06/05/2013  . Able to mobilize using mobility aids 06/05/2013  . Curvature of spine 10/10/2012  . Acquired deformities of spine 10/10/2012  . 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 FOM (frequency of micturition) 08/21/2011  . Cervical spondylosis without myelopathy 05/03/2011  . Cervical osteoarthritis 05/03/2011  . DDD (degenerative disc disease), lumbosacral 05/03/2011  . Angulation of spine 05/03/2011  . LBP (low back pain) 05/03/2011  . Degenerative arthritis of lumbar spine 05/03/2011  . Lumbosacral spondylosis 05/03/2011  . Back pain, chronic 03/29/2011  . Benign fibroma of prostate 03/29/2011  . Urgency of micturation 03/29/2011  . Hernia, incisional 08/21/2010  . Exomphalos 08/21/2010  . Arthritis 08/07/2010  . CN (constipation) 08/07/2010  . Exposure to radiation 03/15/1953    Jerl Mina ,PT, DPT, E-RYT  10/10/2015, 2:18 PM  [place] MAIN [facility]  SERVICES [address] [place], [place], [address] Phone: [phone]   Fax:  [phone]  Name: Nyair Depaulo MRN: [id number] Date of Birth: [birth year]

## 2015-10-12 ENCOUNTER — Ambulatory Visit: Payer: Medicare Other

## 2015-10-12 VITALS — BP 144/82 | HR 82

## 2015-10-12 DIAGNOSIS — R2681 Unsteadiness on feet: Secondary | ICD-10-CM

## 2015-10-12 DIAGNOSIS — Z974 Presence of external hearing-aid: Secondary | ICD-10-CM

## 2015-10-12 DIAGNOSIS — R2689 Other abnormalities of gait and mobility: Secondary | ICD-10-CM | POA: Diagnosis not present

## 2015-10-12 DIAGNOSIS — Z789 Other specified health status: Secondary | ICD-10-CM

## 2015-10-12 DIAGNOSIS — Z9181 History of falling: Secondary | ICD-10-CM

## 2015-10-12 HISTORY — DX: Other specified health status: Z78.9

## 2015-10-12 HISTORY — DX: Presence of external hearing-aid: Z97.4

## 2015-10-13 NOTE — Therapy (Signed)
[facility] MAIN [facility] SERVICES [address] [place], [place], [address] Phone: [phone]   Fax:  [phone]  Physical Therapy Treatment  Patient Details  Name: Dustin Turner MRN: [id number] Date of Birth: [birth year] Referring Provider: Orene Desanctis, MD  Encounter Date: 10/12/2015      PT End of Session - 10/12/15 1945    Visit Number 2   Number of Visits 12   Date for PT Re-Evaluation 11/21/15   Authorization Type 2 (G-code)   Authorization Time Period 10   PT Start Time 1442   PT Stop Time 1515   PT Time Calculation (min) 33 min   Equipment Utilized During Treatment Gait belt   Activity Tolerance Patient tolerated treatment well   Behavior During Therapy WFL for tasks assessed/performed      Past Medical History:  Diagnosis Date  . Arthritis   . BPH (benign prostatic hyperplasia)   . DDD (degenerative disc disease), cervical   . Hypogonadism in male   . Insomnia   . Neurogenic bladder   . OP (osteoporosis)   . Scoliosis   . Spinal stenosis   . Urinary urgency   . Venous stasis     Past Surgical History:  Procedure Laterality Date  . CERVICAL SPINE SURGERY    . HERNIA REPAIR    . JOINT REPLACEMENT    . LUMBAR SPINE SURGERY    . Proleive System Treatment  2006  . ROTATOR CUFF REPAIR    . THORACIC SPINE SURGERY      Vitals:   10/12/15 1448  BP: (!) 144/82  Pulse: 82  SpO2: 98%        Subjective Assessment - 10/12/15 1449    Subjective Pt states increased knee pain today and reports he has not experienced any falls. Pt states he is still interested in a home visit to determine fall risk.   Pertinent History OCD, Neuropathy, scoiliosis, pain pump located at R abdomen, Spinal Cord stimulator, 6 spinal surgeries, L  inguinal hernia 2/2 implantation of the pain pump.  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  Physical activity: resistance bands    Limitations Sitting;Standing;Walking   Patient  Stated Goals get up less at night, sleep better,    Pain Onset More than a month ago      TREATMENT: Therapeutic Exercise Thoracic extension in sitting - 2 x 10 with cueing to increase upper thoracic extension Seated ball roll outs on bed with blue physioball - 2 x 10 Bear hugs with blue physioball with patient in sitting - 2 x 10  Manual Therapy STM utilizing superficial and deep techniques to the right glute and the right QL to decrease increased spasms and pain with patient positioned in prone.          PT Education - 10/12/15 1943    Education provided Yes   Education Details Educated on proper upper back mobility and potential resources for home visits.    Person(s) Educated Patient   Methods Explanation;Demonstration   Comprehension Verbalized understanding;Returned demonstration             PT Long Term Goals - 10/10/15 1616      PT LONG TERM GOAL #1   Title Pt and pt's dtr will report the time to get ready for bed within < 1.5 hrs in order to signify decreased falling asleep.    Time 12   Period Weeks   Status New  PT LONG TERM GOAL #2   Title Pt will report being to initiate urine during day/night with less 5-10 min   in order to participate in social events   Time 12   Period Weeks   Status New     PT LONG TERM GOAL #3   Title Pt will be able to decrease his use of his urinal in car by 50% of the time in order to travel.    Time 12   Period Weeks   Status New     PT LONG TERM GOAL #4   Title Pt will decrease his score on NIH-CPSI from 49% to < 44% in order to demo improved ability to urinate will less difficulty.    Time 12   Period Weeks   Status New     PT LONG TERM GOAL #5   Title Pt will demo decreased PSQI from 71% to < 61% in order to demo improved QOL   Time 12   Period Weeks   Status New     Additional Long Term Goals   Additional Long Term Goals Yes     PT LONG TERM GOAL #6   Title Patient will score a <16 sec on the TUG to  demonstrate significant improvement for decreasing fall risk   Baseline 19.16sec   Time 12   Period Weeks   Status New     PT LONG TERM GOAL #7   Title Patient will score <14sec on the five time sit to stand to demonstrate significant improvement in LE strength and improved ability to transfer from sitting to standing   Baseline 16.6 sec   Time 12   Period Weeks   Status New     PT LONG TERM GOAL #8   Title Patient will score >3153m/s on the 10 m walk test to more safely ambulate across the street   Baseline .58 m/s   Time 12   Period Weeks   Status New               Plan - 10/13/15 0947    Clinical Impression Statement Pt demonstrates decreased muscle spasms and pain in the QL and right glute area after performing STM indicating improvement in tissue elasticity and pain. Performed decreased standing exercises secondary to patient experiencing increased knee pain and focused on performing on thoracic extension and improving stabilization. Patient will benefit from further skilled therapy to return to prior level of funciton.   Rehab Potential Good   Clinical Impairments Affecting Rehab Potential scoliosis, multiple surgeries, psychological factors (anxiety, OCD), sleep disorder   PT Frequency 1x / week   PT Duration 12 weeks   PT Treatment/Interventions ADLs/Self Care Home Management;Aquatic Therapy;Moist Heat;Traction;Gait training;Stair training;Functional mobility training;Neuromuscular re-education;Balance training;Therapeutic activities;Patient/family education;Manual lymph drainage;Scar mobilization;Therapeutic exercise;Cryotherapy;Energy conservation;Other (comment)  relaxation techniques   PT Next Visit Plan Static and dynamic balance interventions   PT Home Exercise Plan Ambulating with walking sticks    Consulted and Agree with Plan of Care Patient      Patient will benefit from skilled therapeutic intervention in order to improve the following deficits and  impairments:  Abnormal gait, Decreased activity tolerance, Decreased coordination, Decreased safety awareness, Decreased strength, Postural dysfunction, Improper body mechanics, Decreased scar mobility, Decreased range of motion, Decreased mobility, Decreased balance, Hypomobility, Pain  Visit Diagnosis: Unsteadiness on feet  History of falling     Problem List Patient Active Problem List   Diagnosis Date Noted  . Urinary urgency 05/27/2015  .  Hypogonadism in male 05/27/2015  . Urinary frequency 04/15/2015  . BPH with obstruction/lower urinary tract symptoms 04/15/2015  . Abdominal pain 07/23/2014  . Abnormal gait 07/23/2014  . Absolute anemia 07/23/2014  . Cognitive disorder 07/23/2014  . Narrowing of intervertebral disc space 07/23/2014  . Clinical depression 07/23/2014  . Acid reflux 07/23/2014  . Abdominal hernia 07/23/2014  . HLD (hyperlipidemia) 07/23/2014  . BP (high blood pressure) 07/23/2014  . Amnesia 07/23/2014  . 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 Scoliosis 07/23/2014  . Compressed spine fracture (HCC) 07/23/2014  . Spinal stenosis 07/23/2014  . Facial numbness 06/03/2014  . Spasm 04/09/2014  . Amnestic syndrome, drug-induced (HCC) 12/08/2013  . Obstructive apnea 12/08/2013  . Generalized OA 11/19/2013  . Bad memory 11/19/2013  . Foot pain 09/04/2013  . Polypharmacy 08/20/2013  . 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 Abnormal finding on thyroid function test 07/30/2013  . Daytime somnolence 07/30/2013  . Encounter for screening for lipoid disorders 07/30/2013  . OP (osteoporosis) 07/30/2013  . Chronic pain 06/29/2013  . Cannot sleep 06/29/2013  . Dorsopathy 06/29/2013  . Dermatitis, stasis 06/29/2013  . Blue color skin 06/05/2013  . Excessive falling 06/05/2013  . Able to mobilize using mobility aids 06/05/2013  . Curvature of spine 10/10/2012  . Acquired deformities of spine 10/10/2012  . Failed back syndrome of lumbar spine  05/05/2012  . FOM (frequency of micturition) 08/21/2011  . Cervical spondylosis without myelopathy 05/03/2011  . Cervical osteoarthritis 05/03/2011  . DDD (degenerative disc disease), lumbosacral 05/03/2011  . Angulation of spine 05/03/2011  . LBP (low back pain) 05/03/2011  . Degenerative arthritis of lumbar spine 05/03/2011  . Lumbosacral spondylosis 05/03/2011  . Back pain, chronic 03/29/2011  . Benign fibroma of prostate 03/29/2011  . Urgency of micturation 03/29/2011  . Hernia, incisional 08/21/2010  . Exomphalos 08/21/2010  . Arthritis 08/07/2010  . CN (constipation) 08/07/2010  . Exposure to radiation 03/15/1953    Myrene Galas, PT DPT 10/13/2015, 9:52 AM  [facility] MAIN [facility] SERVICES [address] [place], [place], [address] Phone: [phone]   Fax:  [phone]  Name: Dustin Turner MRN: [id number] Date of Birth: [birth year]

## 2015-10-17 ENCOUNTER — Ambulatory Visit: Payer: Medicare Other | Attending: Pediatrics | Admitting: Physical Therapy

## 2015-10-17 ENCOUNTER — Other Ambulatory Visit: Payer: Self-pay

## 2015-10-17 DIAGNOSIS — R2681 Unsteadiness on feet: Secondary | ICD-10-CM | POA: Insufficient documentation

## 2015-10-17 DIAGNOSIS — R278 Other lack of coordination: Secondary | ICD-10-CM | POA: Diagnosis present

## 2015-10-17 DIAGNOSIS — R2689 Other abnormalities of gait and mobility: Secondary | ICD-10-CM | POA: Diagnosis not present

## 2015-10-17 DIAGNOSIS — Z9181 History of falling: Secondary | ICD-10-CM | POA: Diagnosis present

## 2015-10-17 NOTE — Patient Instructions (Addendum)
   Laying on bed:  Laying on your Left side:  _Open book (handout)  Only turning  to the Right   15 reps      _Clam shells  10 reps       Laying on your back:   Starting position: R knee bent, L hand by L hip Inhale nothing,  Exhale straighten R leg and raise L hand over head with palms facing the midline   Switch to other leg. 5 reps, switch   X 3 sets    Laying on L side:  Clams shells  15 reps     Seated exercsies:   Sidebend with arm over head as you high five the sky and rotate the trunk 5x      Cat/ Cow:  Hands on thighs, inhale chin down, exhale , squeeze shoulder blades together and lift head and lengthen tall 5x     Standing in front of wall with crutches to practice upright posture: 10 reps. Press crutches downward to floor. Keep a chair in front of you for safety.   Wide feet, crutches end wide

## 2015-10-18 NOTE — Therapy (Signed)
[place] MAIN [facility] SERVICES [address] [place], [place], [address] Phone: [phone]   Fax:  [phone]  Physical Therapy Treatment  Patient Details  Name: Dustin Turner MRN: [id number] Date of Birth: [birth year] Referring Provider: Barbaraann Boys, MD  Encounter Date: 10/17/2015      PT End of Session - 10/18/15 2318    Visit Number 3   Number of Visits 12   Date for PT Re-Evaluation 11/21/15   Authorization Type 3 (G-code)   Authorization Time Period 10   PT Start Time 1310   PT Stop Time 1415   PT Time Calculation (min) 65 min   Activity Tolerance Patient tolerated treatment well;No increased pain   Behavior During Therapy WFL for tasks assessed/performed      Past Medical History:  Diagnosis Date  . Arthritis   . BPH (benign prostatic hyperplasia)   . DDD (degenerative disc disease), cervical   . Hypogonadism in male   . Insomnia   . Neurogenic bladder   . OP (osteoporosis)   . Scoliosis   . Spinal stenosis   . Urinary urgency   . Venous stasis     Past Surgical History:  Procedure Laterality Date  . CERVICAL SPINE SURGERY    . HERNIA REPAIR    . JOINT REPLACEMENT    . LUMBAR SPINE SURGERY    . Proleive System Treatment  2006  . ROTATOR CUFF REPAIR    . THORACIC SPINE SURGERY      There were no vitals filed for this visit.          [facility] PT Assessment - 10/18/15 2310      Palpation   Palpation comment increased scar restrictions lumbar, thoracic regions.      Ambulation/Gait   Gait Comments more spinal ext with use of crutches compared to walking stick.  Cued for wider BOS for more balance. Demo'd no unsteadiness but required UE reaching/ excessive spinal flexion                      [facility] Adult PT Treatment/Exercise - 10/18/15 2310      Therapeutic Activites    Therapeutic Activities --  see pt instructions     Manual Therapy   Manual therapy comments scar releases over thoracic  and upper lumbar scars in side lying                PT Education - 10/18/15 2316    Education provided Yes   Education Details HEP, safety education: educated on tucking shirt in while seated instead of standing and performing standing by wall execise with chair in front of him and have wife close by to assist if need   Person(s) Educated Patient   Methods Explanation;Demonstration;Tactile cues;Verbal cues;Handout   Comprehension Returned demonstration;Verbalized understanding             PT Long Term Goals - 10/10/15 1616      PT LONG TERM GOAL #1   Title Pt and pt's dtr will report the time to get ready for bed within < 1.5 hrs in order to signify decreased falling asleep.    Time 12   Period Weeks   Status New     PT LONG TERM GOAL #2   Title Pt will report being to initiate urine during day/night with less 5-10 min   in order to participate in social events   Time 12   Period Weeks  Status New     PT LONG TERM GOAL #3   Title Pt will be able to decrease his use of his urinal in car by 50% of the time in order to travel.    Time 12   Period Weeks   Status New     PT LONG TERM GOAL #4   Title Pt will decrease his score on NIH-CPSI from 49% to < 44% in order to demo improved ability to urinate will less difficulty.    Time 12   Period Weeks   Status New     PT LONG TERM GOAL #5   Title Pt will demo decreased PSQI from 71% to < 61% in order to demo improved QOL   Time 12   Period Weeks   Status New     Additional Long Term Goals   Additional Long Term Goals Yes     PT LONG TERM GOAL #6   Title Patient will score a <16 sec on the TUG to demonstrate significant improvement for decreasing fall risk   Baseline 19.16sec   Time 12   Period Weeks   Status New     PT LONG TERM GOAL #7   Title Patient will score <14sec on the five time sit to stand to demonstrate significant improvement in LE strength and improved ability to transfer from sitting to  standing   Baseline 16.6 sec   Time 12   Period Weeks   Status New     PT LONG TERM GOAL #8   Title Patient will score >54ms on the 10 m walk test to more safely ambulate across the street   Baseline .58 m/s   Time 12   Period Weeks   Status New               Plan - 10/18/15 2319    Clinical Impression Statement Pt reports urge protocol technique from previous sessions has helped him to decrease his nocturia episodes. Today, pt tolerated the new HEP without complaints. Focused on spinal and hip extension exercises and plan to incorporate use of therabands at next session. PT communicated with other treating PT re: his new HEP and his interest to get back to practicing tai chi with improved balance. Pt continues to benefit skilled PT.    Rehab Potential Good   Clinical Impairments Affecting Rehab Potential scoliosis, multiple surgeries, psychological factors (anxiety, OCD), sleep disorder   PT Frequency 1x / week   PT Duration 12 weeks   PT Treatment/Interventions ADLs/Self Care Home Management;Aquatic Therapy;Moist Heat;Traction;Gait training;Stair training;Functional mobility training;Neuromuscular re-education;Balance training;Therapeutic activities;Patient/family education;Manual lymph drainage;Scar mobilization;Therapeutic exercise;Cryotherapy;Energy conservation;Other (comment)  relaxation techniques   Consulted and Agree with Plan of Care Patient      Patient will benefit from skilled therapeutic intervention in order to improve the following deficits and impairments:  Abnormal gait, Decreased activity tolerance, Decreased coordination, Decreased safety awareness, Decreased strength, Postural dysfunction, Improper body mechanics, Decreased scar mobility, Decreased range of motion, Decreased mobility, Decreased balance, Hypomobility  Visit Diagnosis: Other abnormalities of gait and mobility  Other lack of coordination     Problem List Patient Active Problem List    Diagnosis Date Noted  . Urinary urgency 05/27/2015  . Hypogonadism in male 05/27/2015  . Urinary frequency 04/15/2015  . BPH with obstruction/lower urinary tract symptoms 04/15/2015  . Abdominal pain 07/23/2014  . Abnormal gait 07/23/2014  . Absolute anemia 07/23/2014  . Cognitive disorder 07/23/2014  . Narrowing of intervertebral disc space 07/23/2014  .  Clinical depression 07/23/2014  . Acid reflux 07/23/2014  . Abdominal hernia 07/23/2014  . HLD (hyperlipidemia) 07/23/2014  . BP (high blood pressure) 07/23/2014  . Amnesia 07/23/2014  . 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 Scoliosis 07/23/2014  . Compressed spine fracture ([place]) 07/23/2014  . Spinal stenosis 07/23/2014  . Facial numbness 06/03/2014  . Spasm 04/09/2014  . Amnestic syndrome, drug-induced ([place]) 12/08/2013  . Obstructive apnea 12/08/2013  . Generalized OA 11/19/2013  . Bad memory 11/19/2013  . Foot pain 09/04/2013  . Polypharmacy 08/20/2013  . 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 Abnormal finding on thyroid function test 07/30/2013  . Daytime somnolence 07/30/2013  . Encounter for screening for lipoid disorders 07/30/2013  . OP (osteoporosis) 07/30/2013  . Chronic pain 06/29/2013  . Cannot sleep 06/29/2013  . Dorsopathy 06/29/2013  . Dermatitis, stasis 06/29/2013  . Blue color skin 06/05/2013  . Excessive falling 06/05/2013  . Able to mobilize using mobility aids 06/05/2013  . Curvature of spine 10/10/2012  . Acquired deformities of spine 10/10/2012  . 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 FOM (frequency of micturition) 08/21/2011  . Cervical spondylosis without myelopathy 05/03/2011  . Cervical osteoarthritis 05/03/2011  . DDD (degenerative disc disease), lumbosacral 05/03/2011  . Angulation of spine 05/03/2011  . LBP (low back pain) 05/03/2011  . Degenerative arthritis of lumbar spine 05/03/2011  . Lumbosacral spondylosis 05/03/2011  . Back pain, chronic 03/29/2011  .  Benign fibroma of prostate 03/29/2011  . Urgency of micturation 03/29/2011  . Hernia, incisional 08/21/2010  . Exomphalos 08/21/2010  . Arthritis 08/07/2010  . CN (constipation) 08/07/2010  . Exposure to radiation 03/15/1953    Jerl Mina ,PT, DPT, E-RYT   10/18/2015, 11:20 PM  [place] MAIN [facility] SERVICES [address] [place], [place], [address] Phone: [phone]   Fax:  [phone]  Name: Dustin Turner MRN: [id number] Date of Birth: 09-18-1935

## 2015-10-19 ENCOUNTER — Encounter: Payer: Self-pay | Admitting: Physical Therapy

## 2015-10-19 ENCOUNTER — Ambulatory Visit: Payer: Medicare Other | Admitting: Physical Therapy

## 2015-10-19 DIAGNOSIS — R2689 Other abnormalities of gait and mobility: Secondary | ICD-10-CM | POA: Diagnosis not present

## 2015-10-19 DIAGNOSIS — R2681 Unsteadiness on feet: Secondary | ICD-10-CM

## 2015-10-19 DIAGNOSIS — R278 Other lack of coordination: Secondary | ICD-10-CM

## 2015-10-19 NOTE — Therapy (Signed)
[facility] [address] [place], [place], [address] Phone: [phone]   Fax:  [phone]  Physical Therapy Treatment  Patient Details  Name: Dustin Turner MRN: [id number] Date of Birth: 09/26/1935 Referring Provider: Orene DesanctisKaren Behling, MD  Encounter Date: 10/19/2015      PT End of Session - 10/19/15 1643    Visit Number 3   Number of Visits 12   Date for PT Re-Evaluation 11/21/15   Authorization Type  (G-code)   Authorization Time Period 10   PT Start Time 1522   PT Stop Time 1615   PT Time Calculation (min) 53 min   Equipment Utilized During Treatment Gait belt   Activity Tolerance Patient tolerated treatment well;No increased pain   Behavior During Therapy WFL for tasks assessed/performed      Past Medical History:  Diagnosis Date  . Arthritis   . BPH (benign prostatic hyperplasia)   . DDD (degenerative disc disease), cervical   . Hypogonadism in male   . Insomnia   . Neurogenic bladder   . OP (osteoporosis)   . Scoliosis   . Spinal stenosis   . Urinary urgency   . Venous stasis     Past Surgical History:  Procedure Laterality Date  . CERVICAL SPINE SURGERY    . HERNIA REPAIR    . JOINT REPLACEMENT    . LUMBAR SPINE SURGERY    . Proleive System Treatment  2006  . ROTATOR CUFF REPAIR    . THORACIC SPINE SURGERY      There were no vitals filed for this visit.      Subjective Assessment - 10/19/15 1525    Subjective Pt states his pain has been bad in his knee during movement and weight bearing. He states he has been doing his exercises from therapy. He notes that he is not able to sleep on his side anymore due to his knee pain, and that his falls occur when he is falling asleep while performing different tasks.    Pertinent History OCD, Neuropathy, scoliosis, pain pump located at R abdomen, Spinal Cord stimulator, 6 spinal surgeries, L  inguinal hernia 2/2 implantation of the pain pump.  43 years of  quitting alcohol., after 20 years of drinking.  Physical activity: resistance bands    Limitations Sitting;Standing;Walking   Patient Stated Goals get up less at night, sleep better,    Currently in Pain? Yes   Pain Score 7    Pain Location Knee   Pain Orientation Right   Pain Descriptors / Indicators Aching   Pain Onset More than a month ago         Treatment:    TherEx: Supine extension, opposite arm and opposite leg, x15, instructed on breathing focused on with previous PT session Seated thoracic extension over chair, 2x10, min VCs for continued upright posture Seated cervical retraction, 2x10, mod VCs, demonstration, and light tactile cueing for correct performance of exercise, patient continues to have difficulty performing Seated scapular retractions, yellow tband, 3x10, min VCs for upright posture and and moving arms closer to body Standing hip ext, yellow tband, 2x10, min VCs to maintain upright posture and squeeze glut max muscle at end of motion, 2 HHA on // bars, CGA for safety Supine Transverse Abdominus marches, x5, instructed pt to d/c due to wanting PT for pelvic health to address core strengthening that is not stressful to the pelvic floor. Pt showed PT opening book exercises from last therapy session,  x5, instructed patient to ask PT addressing pelvic health for technique correction prn.   Neuro Re-ed: Static balance, no UE support in // bars, 2x30 sec; min VCs to stand upright, patient unable to straighten knees due to having forward trunk flexion if in full knee extension Static stance with scapular retractions, x10, min VCs for appropriate arm positioning and upright posture Standing next to wall chair in front with crutches to increase height by pushing through the crutches, pt seems to be leaning on the crutches with the upper portion against his armpits fully  Gait Training: Instructed to ambulate for 2x ~90 feet with platform walker and then crutches each; pt's  posture improves with the platform walker with improved stability. As patient continues walking long term he again continues to have a very forward posture similar to using the crutches. With crutch ambulation the patient's crutches are very wide even with min-mod VCs to move crutches closer to body.  Crutches decreased by 1 hole (5'7"), with decreased patient notes less pressure on under arms and that it "feels better" when performing static stance. With ambulation with shorter crutches, the patient is able to push upright into a better posture and again notes that it feels better, and he continues to feel stable.                    PT Education - 10/19/15 1642    Education provided Yes   Education Details continue with HEP given, instructed in lowering crutches slightly due to pinching under armpit   Person(s) Educated Patient   Methods Explanation   Comprehension Verbalized understanding             PT Long Term Goals - 10/10/15 1616      PT LONG TERM GOAL #1   Title Pt and pt's dtr will report the time to get ready for bed within < 1.5 hrs in order to signify decreased falling asleep.    Time 12   Period Weeks   Status New     PT LONG TERM GOAL #2   Title Pt will report being to initiate urine during day/night with less 5-10 min   in order to participate in social events   Time 12   Period Weeks   Status New     PT LONG TERM GOAL #3   Title Pt will be able to decrease his use of his urinal in car by 50% of the time in order to travel.    Time 12   Period Weeks   Status New     PT LONG TERM GOAL #4   Title Pt will decrease his score on NIH-CPSI from 49% to < 44% in order to demo improved ability to urinate will less difficulty.    Time 12   Period Weeks   Status New     PT LONG TERM GOAL #5   Title Pt will demo decreased PSQI from 71% to < 61% in order to demo improved QOL   Time 12   Period Weeks   Status New     Additional Long Term Goals    Additional Long Term Goals Yes     PT LONG TERM GOAL #6   Title Patient will score a <16 sec on the TUG to demonstrate significant improvement for decreasing fall risk   Baseline 19.16sec   Time 12   Period Weeks   Status New     PT LONG TERM GOAL #7  Title Patient will score <14sec on the five time sit to stand to demonstrate significant improvement in LE strength and improved ability to transfer from sitting to standing   Baseline 16.6 sec   Time 12   Period Weeks   Status New     PT LONG TERM GOAL #8   Title Patient will score >66m/s on the 10 m walk test to more safely ambulate across the street   Baseline .58 m/s   Time 12   Period Weeks   Status New               Plan - 10/19/15 1654    Clinical Impression Statement Pt continues to have poor posture due to multiple previous surgeries and chronic deficits from scoliosis. The patient requires min-mod VCs for continued appropriate posture throughout treatment. He requires CGA-minA for safety during activities and min-mod VCs for appropriate performance of exercises. Due to crutches pushing into axilla portion too much, crutches were decreased by 1 notch. Pt notes increased comfort level and continued stability. The pt would continue to benefit from skilled PT in order to address posture, strength, gait training with appropriate posture, and decreased safety with mobility.    Rehab Potential Good   Clinical Impairments Affecting Rehab Potential scoliosis, multiple surgeries, psychological factors (anxiety, OCD), sleep disorder   PT Frequency 1x / week   PT Duration 12 weeks   PT Treatment/Interventions ADLs/Self Care Home Management;Aquatic Therapy;Moist Heat;Traction;Gait training;Stair training;Functional mobility training;Neuromuscular re-education;Balance training;Therapeutic activities;Patient/family education;Manual lymph drainage;Scar mobilization;Therapeutic exercise;Cryotherapy;Energy conservation;Other (comment)   relaxation techniques   PT Next Visit Plan Static and dynamic balance interventions, possibility of using platform walker   PT Home Exercise Plan Ambulating with walking sticks    Consulted and Agree with Plan of Care Patient      Patient will benefit from skilled therapeutic intervention in order to improve the following deficits and impairments:  Abnormal gait, Decreased activity tolerance, Decreased coordination, Decreased safety awareness, Decreased strength, Postural dysfunction, Improper body mechanics, Decreased scar mobility, Decreased range of motion, Decreased mobility, Decreased balance, Hypomobility, Pain  Visit Diagnosis: Other abnormalities of gait and mobility  Other lack of coordination  Unsteadiness on feet     Problem List Patient Active Problem List   Diagnosis Date Noted  . Urinary urgency 05/27/2015  . Hypogonadism in male 05/27/2015  . Urinary frequency 04/15/2015  . BPH with obstruction/lower urinary tract symptoms 04/15/2015  . Abdominal pain 07/23/2014  . Abnormal gait 07/23/2014  . Absolute anemia 07/23/2014  . Cognitive disorder 07/23/2014  . Narrowing of intervertebral disc space 07/23/2014  . Clinical depression 07/23/2014  . Acid reflux 07/23/2014  . Abdominal hernia 07/23/2014  . HLD (hyperlipidemia) 07/23/2014  . BP (high blood pressure) 07/23/2014  . Amnesia 07/23/2014  . 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 Scoliosis 07/23/2014  . Compressed spine fracture (HCC) 07/23/2014  . Spinal stenosis 07/23/2014  . Facial numbness 06/03/2014  . Spasm 04/09/2014  . Amnestic syndrome, drug-induced (HCC) 12/08/2013  . Obstructive apnea 12/08/2013  . Generalized OA 11/19/2013  . Bad memory 11/19/2013  . Foot pain 09/04/2013  . Polypharmacy 08/20/2013  . 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 Abnormal finding on thyroid function test 07/30/2013  . Daytime somnolence 07/30/2013  . Encounter for screening for lipoid disorders  07/30/2013  . OP (osteoporosis) 07/30/2013  . Chronic pain 06/29/2013  . Cannot sleep 06/29/2013  . Dorsopathy 06/29/2013  . Dermatitis, stasis 06/29/2013  . Blue color skin 06/05/2013  . Excessive falling  06/05/2013  . Able to mobilize using mobility aids 06/05/2013  . Curvature of spine 10/10/2012  . Acquired deformities of spine 10/10/2012  . 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 FOM (frequency of micturition) 08/21/2011  . Cervical spondylosis without myelopathy 05/03/2011  . Cervical osteoarthritis 05/03/2011  . DDD (degenerative disc disease), lumbosacral 05/03/2011  . Angulation of spine 05/03/2011  . LBP (low back pain) 05/03/2011  . Degenerative arthritis of lumbar spine 05/03/2011  . Lumbosacral spondylosis 05/03/2011  . Back pain, chronic 03/29/2011  . Benign fibroma of prostate 03/29/2011  . Urgency of micturation 03/29/2011  . Hernia, incisional 08/21/2010  . Exomphalos 08/21/2010  . Arthritis 08/07/2010  . CN (constipation) 08/07/2010  . Exposure to radiation 03/15/1953   Trula Ore, SPT This entire session was performed under direct supervision and direction of a licensed therapist/therapist assistant . I have personally read, edited and approve of the note as written.  Trotter,Margaret PT, DPT 10/20/2015, 9:10 AM  [facility] MAIN [facility] SERVICES [address] [place], [place], [address] Phone: [phone]   Fax:  [phone]  Name: Dustin Turner MRN: [id number] Date of Birth: 1935-10-17

## 2015-10-24 ENCOUNTER — Ambulatory Visit: Payer: Medicare Other

## 2015-10-24 ENCOUNTER — Ambulatory Visit: Payer: Medicare Other | Admitting: Physical Therapy

## 2015-10-24 DIAGNOSIS — R2681 Unsteadiness on feet: Secondary | ICD-10-CM

## 2015-10-24 DIAGNOSIS — R2689 Other abnormalities of gait and mobility: Secondary | ICD-10-CM

## 2015-10-24 DIAGNOSIS — R278 Other lack of coordination: Secondary | ICD-10-CM

## 2015-10-24 NOTE — Therapy (Signed)
[place] MAIN [facility] SERVICES [address] [place], [place], [address] Phone: [phone]   Fax:  [phone]  Physical Therapy Treatment  Patient Details  Name: Dustin Turner MRN: [id number] Date of Birth: 1935/01/16 Referring Provider: Orene Desanctis, MD  Encounter Date: 10/24/2015      PT End of Session - 10/24/15 1522    Visit Number 4   Number of Visits 12   Date for PT Re-Evaluation 11/21/15   Authorization Type  (4 G-code)   Authorization Time Period 10   PT Start Time 1416   PT Stop Time 1501   PT Time Calculation (min) 45 min   Equipment Utilized During Treatment Gait belt   Activity Tolerance Patient tolerated treatment well;No increased pain   Behavior During Therapy WFL for tasks assessed/performed      Past Medical History:  Diagnosis Date  . Arthritis   . BPH (benign prostatic hyperplasia)   . DDD (degenerative disc disease), cervical   . Hypogonadism in male   . Insomnia   . Neurogenic bladder   . OP (osteoporosis)   . Scoliosis   . Spinal stenosis   . Urinary urgency   . Venous stasis     Past Surgical History:  Procedure Laterality Date  . CERVICAL SPINE SURGERY    . HERNIA REPAIR    . JOINT REPLACEMENT    . LUMBAR SPINE SURGERY    . Proleive System Treatment  2006  . ROTATOR CUFF REPAIR    . THORACIC SPINE SURGERY      There were no vitals filed for this visit.      Subjective Assessment - 10/24/15 1419    Subjective Pt states he feels his balance is getting worse and wants to address these decifits. States he's been performing HEP.    Pertinent History OCD, Neuropathy, scoliosis, pain pump located at R abdomen, Spinal Cord stimulator, 6 spinal surgeries, L  inguinal hernia 2/2 implantation of the pain pump.  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  Physical activity: resistance bands    Limitations Sitting;Standing;Walking   Patient Stated Goals get up less at night, sleep  better,    Currently in Pain? Yes   Pain Score 7    Pain Location Knee   Pain Orientation Right   Pain Descriptors / Indicators Aching   Pain Type Chronic pain   Pain Onset More than a month ago   Pain Frequency Constant      TREATMENT: Educated on use of lofstrand crutches and fitted for gait and balance activities. Discontinued use of lofstrand crutches secondary to increased thoracic flexion with ambulation.   TherEx: Seated cat/camel in chair with breathing techniques included - x 15 for coordination  Standing Scapular retraction at MATRIX -- x15 #10, x15 5# Side stepping in  bars - x 3 down and back   Neuromuscular Rehab Semi-tandem ambulation with crutches - 34ft x 2 Tandem ambulation with crutches - 69ft x2 Feet together EO on flatground with UE support - x 30seconds  Feet together EC on flat ground with UE support x 30 seconds Feet together EO on Airex with UE support x30 seconds Semi Tandem Stance on airex pad - x 30 seconds Tandem amb in  with cueing to decrease UE support on bars - x3 down and back Marching on airex pad with emphasis of thoracic extension - x 20  Side stepping over airex beam with emphasis on decreased UE support -  x 3 down and back       PT Education - 10/24/15 1511    Education provided Yes   Education Details HEP: Educated on use and amb of lofstrand crutches   Person(s) Educated Patient   Methods Explanation;Demonstration   Comprehension Verbalized understanding;Returned demonstration             PT Long Term Goals - 10/10/15 1616      PT LONG TERM GOAL #1   Title Pt and pt's dtr will report the time to get ready for bed within < 1.5 hrs in order to signify decreased falling asleep.    Time 12   Period Weeks   Status New     PT LONG TERM GOAL #2   Title Pt will report being to initiate urine during day/night with less 5-10 min   in order to participate in social events   Time 12   Period Weeks   Status New     PT LONG  TERM GOAL #3   Title Pt will be able to decrease his use of his urinal in car by 50% of the time in order to travel.    Time 12   Period Weeks   Status New     PT LONG TERM GOAL #4   Title Pt will decrease his score on NIH-CPSI from 49% to < 44% in order to demo improved ability to urinate will less difficulty.    Time 12   Period Weeks   Status New     PT LONG TERM GOAL #5   Title Pt will demo decreased PSQI from 71% to < 61% in order to demo improved QOL   Time 12   Period Weeks   Status New     Additional Long Term Goals   Additional Long Term Goals Yes     PT LONG TERM GOAL #6   Title Patient will score a <16 sec on the TUG to demonstrate significant improvement for decreasing fall risk   Baseline 19.16sec   Time 12   Period Weeks   Status New     PT LONG TERM GOAL #7   Title Patient will score <14sec on the five time sit to stand to demonstrate significant improvement in LE strength and improved ability to transfer from sitting to standing   Baseline 16.6 sec   Time 12   Period Weeks   Status New     PT LONG TERM GOAL #8   Title Patient will score >18m/s on the 10 m walk test to more safely ambulate across the street   Baseline .58 m/s   Time 12   Period Weeks   Status New               Plan - 10/24/15 1523    Clinical Impression Statement Focused on improving thoracic extension with functional activities such as ambulation and balance. Patient demonstrates increased weakness with thoracic extenors as well as decreased hamstring length bilaterally. Focused on performing balance exercises to decrease fall risk with exercise and patient will benefit from further skilled therapy to improve strength and decrease fall risk.    Rehab Potential Good   Clinical Impairments Affecting Rehab Potential scoliosis, multiple surgeries, psychological factors (anxiety, OCD), sleep disorder   PT Frequency 1x / week   PT Duration 12 weeks   PT Treatment/Interventions  ADLs/Self Care Home Management;Aquatic Therapy;Moist Heat;Traction;Gait training;Stair training;Functional mobility training;Neuromuscular re-education;Balance training;Therapeutic activities;Patient/family education;Manual lymph drainage;Scar mobilization;Therapeutic exercise;Cryotherapy;Energy conservation;Other (comment)  relaxation techniques   PT Next Visit Plan Static and dynamic balance interventions, possibility of using platform walker   PT Home Exercise Plan Ambulating with walking sticks    Consulted and Agree with Plan of Care Patient      Patient will benefit from skilled therapeutic intervention in order to improve the following deficits and impairments:  Abnormal gait, Decreased activity tolerance, Decreased coordination, Decreased safety awareness, Decreased strength, Postural dysfunction, Improper body mechanics, Decreased scar mobility, Decreased range of motion, Decreased mobility, Decreased balance, Hypomobility, Pain  Visit Diagnosis: Other abnormalities of gait and mobility  Other lack of coordination  Unsteadiness on feet     Problem List Patient Active Problem List   Diagnosis Date Noted  . Urinary urgency 05/27/2015  . Hypogonadism in male 05/27/2015  . Urinary frequency 04/15/2015  . BPH with obstruction/lower urinary tract symptoms 04/15/2015  . Abdominal pain 07/23/2014  . Abnormal gait 07/23/2014  . Absolute anemia 07/23/2014  . Cognitive disorder 07/23/2014  . Narrowing of intervertebral disc space 07/23/2014  . Clinical depression 07/23/2014  . Acid reflux 07/23/2014  . Abdominal hernia 07/23/2014  . HLD (hyperlipidemia) 07/23/2014  . BP (high blood pressure) 07/23/2014  . Amnesia 07/23/2014  . 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 Scoliosis 07/23/2014  . Compressed spine fracture (HCC) 07/23/2014  . Spinal stenosis 07/23/2014  . Facial numbness 06/03/2014  . Spasm 04/09/2014  . Amnestic syndrome, drug-induced (HCC) 12/08/2013  . Obstructive  apnea 12/08/2013  . Generalized OA 11/19/2013  . Bad memory 11/19/2013  . Foot pain 09/04/2013  . Polypharmacy 08/20/2013  . 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 Abnormal finding on thyroid function test 07/30/2013  . Daytime somnolence 07/30/2013  . Encounter for screening for lipoid disorders 07/30/2013  . OP (osteoporosis) 07/30/2013  . Chronic pain 06/29/2013  . Cannot sleep 06/29/2013  . Dorsopathy 06/29/2013  . Dermatitis, stasis 06/29/2013  . Blue color skin 06/05/2013  . Excessive falling 06/05/2013  . Able to mobilize using mobility aids 06/05/2013  . Curvature of spine 10/10/2012  . Acquired deformities of spine 10/10/2012  . 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 FOM (frequency of micturition) 08/21/2011  . Cervical spondylosis without myelopathy 05/03/2011  . Cervical osteoarthritis 05/03/2011  . DDD (degenerative disc disease), lumbosacral 05/03/2011  . Angulation of spine 05/03/2011  . LBP (low back pain) 05/03/2011  . Degenerative arthritis of lumbar spine 05/03/2011  . Lumbosacral spondylosis 05/03/2011  . Back pain, chronic 03/29/2011  . Benign fibroma of prostate 03/29/2011  . Urgency of micturation 03/29/2011  . Hernia, incisional 08/21/2010  . Exomphalos 08/21/2010  . Arthritis 08/07/2010  . CN (constipation) 08/07/2010  . Exposure to radiation 03/15/1953    Myrene GalasWesley Barrett Goldie, PT DPT 10/24/2015, 3:27 PM  [facility] MAIN [facility] [address] [place], [place], [address] Phone: [phone]   Fax:  [phone]  Name: Dustin Turner MRN: [id number] Date of Birth: 11/04/1935

## 2015-10-24 NOTE — Patient Instructions (Signed)
PELVIC FLOOR / KEGEL EXERCISES    Pelvic floor/ Kegel exercises are used to strengthen the muscles in the base of your pelvis that are responsible for supporting your pelvic organs and preventing urine/feces leakage. Based on your therapist's recommendations, they can be performed while standing, sitting, or lying down.  Make yourself aware of this muscle group spanning in a diamond shaped area. Top of the diamond is the pubic bone, bottom of the diamond is the tailbone, and edges of the diamond is where the sitting bones are located. Use these cues to notice your ability to lengthen and lower them towards feet with inhalation (pelvic floor lowers like a hammock)  and to lift them upward with exhalation (pelvic floor domes up like an umbrella) :   Dustin Turner. Inhale, do nothing but feel the belly expand and pelvic floor lowering/expanding like a hammock. Exhale, feel pelvic floor lifting upwards with the scrotum as if you are walking into a cold lake.   . Inhale, do nothing, relax belly and pelvic floor. Then exhale, pull up the center of the diamond shaped area of your pelvic floor muscles inside your body while your belly/back muscles wrap tighter or "hug" around you.  [name] Kitchen. Another way to practice is by place your hand on top of your pubic bone. Tighten and draw in the muscles around the anal and uretha openings. You will feel the muscles lift towards your pubic bone and squeeze the openings shut. If you are squeezing your buttock muscles too, you will need to decrease your amount of effort by 50% to not involve the buttocks.   Common Errors: . Breath holding: If you are holding your breath, you may be bearing down against your bladder instead of pulling it up. If you belly bulges up while you are squeezing, you are holding your breath. Be sure to breathe gently in and out while exercising. Counting out loud may help you avoid holding your breath. . Accessory muscle use: You should not see or feel other muscle  movement when performing pelvic floor exercises. When done properly, no one can tell that you are performing the exercises. Keep the buttocks, belly and inner thighs relaxed. . Overdoing it: Your muscles can fatigue and stop working for you if you over-exercise. You may actually leak more or feel soreness at the lower abdomen or rectum.  YOUR HOME EXERCISE PROGRAM   LONG HOLDS: Position: sidelying  . inhale, exhale then squeeze the muscle and then count aloud for 5 seconds. Rest and allow the pelvic floor to move in its full range by taking 5 long breaths  . Perform 5 repetitions, 1 x/ day    ______________ Relaxation options:   Practicing a right brain activity like drawing with grandson  Listening to body scan relaxation

## 2015-10-25 NOTE — Therapy (Signed)
[place] MAIN [facility] SERVICES [address] [place], [place], [address] Phone: [phone]   Fax:  [phone]  Physical Therapy Treatment  Patient Details  Name: Dustin Turner MRN: [id number] Date of Birth: Apr 13, 1935 Referring Provider: Barbaraann Boys, MD  Encounter Date: 10/24/2015      PT End of Session - 10/25/15 2317    Visit Number 5   Number of Visits 12   Date for PT Re-Evaluation 11/21/15   Authorization Type  (5 G-code)   Authorization Time Period 10   PT Start Time 1515   PT Stop Time 1625   PT Time Calculation (min) 70 min   Equipment Utilized During Treatment Gait belt   Activity Tolerance Patient tolerated treatment well;No increased pain   Behavior During Therapy WFL for tasks assessed/performed      Past Medical History:  Diagnosis Date  . Arthritis   . BPH (benign prostatic hyperplasia)   . DDD (degenerative disc disease), cervical   . Hypogonadism in male   . Insomnia   . Neurogenic bladder   . OP (osteoporosis)   . Scoliosis   . Spinal stenosis   . Urinary urgency   . Venous stasis     Past Surgical History:  Procedure Laterality Date  . CERVICAL SPINE SURGERY    . HERNIA REPAIR    . JOINT REPLACEMENT    . LUMBAR SPINE SURGERY    . Proleive System Treatment  2006  . ROTATOR CUFF REPAIR    . THORACIC SPINE SURGERY      There were no vitals filed for this visit.      Subjective Assessment - 10/25/15 2302    Subjective Pt reports the urge protocol has increased his night time voiding. Pt reported he slept well for the first time with less voiding trips 3 nights ago when he took mm relaxant medications due to increased mm spasms he has been having lately.  Pt states he has been doing "kegels" from watching a youtube video over the past year. Pt has been doing 15 reps / 5 sets every day but lately has been only doing 15 reps x 1 set.  Pt continues to he would like to return to performing his tai chi but  he states he has so many medication issues he has to take care of. Dtr and grandson present today.     Pertinent History OCD, Neuropathy, scoliosis, pain pump located at R abdomen, Spinal Cord stimulator, 6 spinal surgeries, L  inguinal hernia 2/2 implantation of the pain pump.  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  Physical activity: resistance bands    Limitations Sitting;Standing;Walking   Patient Stated Goals get up less at night, sleep better,    Pain Onset More than a month ago            [facility] PT Assessment - 10/25/15 2308      Observation/Other Assessments   Observations cat cow exercise performed with excessive thoracic flexion (cued for decreased thoracic flexion)      Palpation   Palpation comment assessed through clothing: "kegels" performed without lengthening of pelvic floor and accessory use of abdominal mm                     [facility] Adult PT Treatment/Exercise - 10/25/15 2315      Therapeutic Activites    Therapeutic Activities --  see pt instructions (proper pelvic floor coordination and lengthening with  kegels, without accessory use of abdominal mm, reps, and progression)                 PT Education - 10/25/15 2315    Education provided Yes   Education Details HEP, education on proper pelvic floor strnegthening/ lengthening exercise and # of reps,  family education on relaxing/ right brain activity such as drawing with grandson, withhold on Urge Protocol,   Person(s) Educated Patient   Methods Explanation;Demonstration;Tactile cues;Verbal cues;Handout   Comprehension Verbalized understanding;Returned demonstration             PT Long Term Goals - 10/25/15 2319      PT LONG TERM GOAL #1   Title Pt and pt's dtr will report the time to get ready for bed within < 1.5 hrs in order to signify decreased falling asleep.    Time 12   Period Weeks   Status On-going     PT LONG TERM GOAL #2   Title Pt will demo  proper technique for kegel with lengthening of pelvic floor mm in order to initiate urine during day/night with less 5-10 min    Time 12   Period Weeks   Status On-going     PT LONG TERM GOAL #3   Title Pt will be able to decrease his use of his urinal in car by 50% of the time in order to travel.    Time 12   Period Weeks   Status On-going     PT LONG TERM GOAL #4   Title Pt will decrease his score on NIH-CPSI from 49% to < 44% in order to demo improved ability to urinate will less difficulty.    Time 12   Period Weeks   Status On-going     PT LONG TERM GOAL #5   Title Pt will demo decreased PSQI from 71% to < 61% in order to demo improved QOL   Time 12   Period Weeks   Status On-going     PT LONG TERM GOAL #6   Title Patient will score a <16 sec on the TUG to demonstrate significant improvement for decreasing fall risk   Baseline 19.16sec   Time 12   Period Weeks   Status New     PT LONG TERM GOAL #7   Title Patient will score <14sec on the five time sit to stand to demonstrate significant improvement in LE strength and improved ability to transfer from sitting to standing   Baseline 16.6 sec   Time 12   Period Weeks   Status New     PT LONG TERM GOAL #8   Title Patient will score >56ms on the 10 m walk test to more safely ambulate across the street   Baseline .58 m/s   Time 12   Period Weeks   Status New               Plan - 10/25/15 2318    Clinical Impression Statement Withheld urge protocol today for the following reasons: 1) pt's report of increased nocturia, 2) pt demo'd no lengthening phase with pelvic floor contractions.  Pt demo'd proper technique with kegels and was educated on 5 reps, 5 sec holds. Pt reported improved sleep quality w/ less voiding trips after taking mm relaxers due to mm spasms.  Anticipate pelvic floor lengthening will play an important role in pt's progress. This pt was also corrected on cat/cow exercise to minimize thoracic  flexion. Pt appeared fatigued because  pt completed a session with other PT prior to today's session. Plan to apply motivational interviewing to help pt feel balanced and not overwhelmed between learning new exercises between two PTs while taking care of his other medical conditions. Plan to facilitate safe ways for pt to return to tai chi practice as pt has repeated expressed this is something he would like to return to. Pt will continue to benefit from skilled PT.    Rehab Potential Good   Clinical Impairments Affecting Rehab Potential scoliosis, multiple surgeries, psychological factors (anxiety, OCD), sleep disorder   PT Frequency 1x / week   PT Duration 12 weeks   PT Treatment/Interventions ADLs/Self Care Home Management;Aquatic Therapy;Moist Heat;Traction;Gait training;Stair training;Functional mobility training;Neuromuscular re-education;Balance training;Therapeutic activities;Patient/family education;Manual lymph drainage;Scar mobilization;Therapeutic exercise;Cryotherapy;Energy conservation;Other (comment)  relaxation techniques   PT Next Visit Plan Static and dynamic balance interventions, possibility of using platform walker   PT Home Exercise Plan Ambulating with walking sticks    Consulted and Agree with Plan of Care Patient      Patient will benefit from skilled therapeutic intervention in order to improve the following deficits and impairments:  Abnormal gait, Decreased activity tolerance, Decreased coordination, Decreased safety awareness, Decreased strength, Postural dysfunction, Improper body mechanics, Decreased scar mobility, Decreased range of motion, Decreased mobility, Decreased balance, Hypomobility, Pain  Visit Diagnosis: Other abnormalities of gait and mobility  Other lack of coordination     Problem List Patient Active Problem List   Diagnosis Date Noted  . Urinary urgency 05/27/2015  . Hypogonadism in male 05/27/2015  . Urinary frequency 04/15/2015  . BPH with  obstruction/lower urinary tract symptoms 04/15/2015  . Abdominal pain 07/23/2014  . Abnormal gait 07/23/2014  . Absolute anemia 07/23/2014  . Cognitive disorder 07/23/2014  . Narrowing of intervertebral disc space 07/23/2014  . Clinical depression 07/23/2014  . Acid reflux 07/23/2014  . Abdominal hernia 07/23/2014  . HLD (hyperlipidemia) 07/23/2014  . BP (high blood pressure) 07/23/2014  . Amnesia 07/23/2014  . 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 Scoliosis 07/23/2014  . Compressed spine fracture ([place]) 07/23/2014  . Spinal stenosis 07/23/2014  . Facial numbness 06/03/2014  . Spasm 04/09/2014  . Amnestic syndrome, drug-induced ([place]) 12/08/2013  . Obstructive apnea 12/08/2013  . Generalized OA 11/19/2013  . Bad memory 11/19/2013  . Foot pain 09/04/2013  . Polypharmacy 08/20/2013  . 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 Abnormal finding on thyroid function test 07/30/2013  . Daytime somnolence 07/30/2013  . Encounter for screening for lipoid disorders 07/30/2013  . OP (osteoporosis) 07/30/2013  . Chronic pain 06/29/2013  . Cannot sleep 06/29/2013  . Dorsopathy 06/29/2013  . Dermatitis, stasis 06/29/2013  . Blue color skin 06/05/2013  . Excessive falling 06/05/2013  . Able to mobilize using mobility aids 06/05/2013  . Curvature of spine 10/10/2012  . Acquired deformities of spine 10/10/2012  . 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 FOM (frequency of micturition) 08/21/2011  . Cervical spondylosis without myelopathy 05/03/2011  . Cervical osteoarthritis 05/03/2011  . DDD (degenerative disc disease), lumbosacral 05/03/2011  . Angulation of spine 05/03/2011  . LBP (low back pain) 05/03/2011  . Degenerative arthritis of lumbar spine 05/03/2011  . Lumbosacral spondylosis 05/03/2011  . Back pain, chronic 03/29/2011  . Benign fibroma of prostate 03/29/2011  . Urgency of micturation 03/29/2011  . Hernia, incisional 08/21/2010  . Exomphalos 08/21/2010   . Arthritis 08/07/2010  . CN (constipation) 08/07/2010  . Exposure to radiation 03/15/1953    Jerl Mina ,PT, DPT,  E-RYT  10/25/2015, 11:21 PM  [place] MAIN [facility] SERVICES [address] [place], [place], [address] Phone: [phone]   Fax:  [phone]  Name: Dustin Turner MRN: [id number] Date of Birth: August 03, 1935

## 2015-10-26 ENCOUNTER — Ambulatory Visit: Payer: Medicare Other

## 2015-10-26 DIAGNOSIS — R278 Other lack of coordination: Secondary | ICD-10-CM

## 2015-10-26 DIAGNOSIS — R2681 Unsteadiness on feet: Secondary | ICD-10-CM

## 2015-10-26 DIAGNOSIS — R2689 Other abnormalities of gait and mobility: Secondary | ICD-10-CM

## 2015-10-26 NOTE — Therapy (Signed)
[place] MAIN [facility] SERVICES [address] [place], [place], [address] Phone: [phone]   Fax:  [phone]  Physical Therapy Treatment  Patient Details  Name: Dustin Turner MRN: [id number] Date of Birth: Jul 16, 1935 Referring Provider: Barbaraann Boys, MD  Encounter Date: 10/26/2015      PT End of Session - 10/26/15 1452    Visit Number 6   Number of Visits 12   Date for PT Re-Evaluation 11/21/15   Authorization Type  (6 G-code)   Authorization Time Period 10   PT Start Time 1406   PT Stop Time 1445   PT Time Calculation (min) 39 min   Equipment Utilized During Treatment Gait belt   Activity Tolerance Patient tolerated treatment well;No increased pain   Behavior During Therapy WFL for tasks assessed/performed      Past Medical History:  Diagnosis Date  . Arthritis   . BPH (benign prostatic hyperplasia)   . DDD (degenerative disc disease), cervical   . Hypogonadism in male   . Insomnia   . Neurogenic bladder   . OP (osteoporosis)   . Scoliosis   . Spinal stenosis   . Urinary urgency   . Venous stasis     Past Surgical History:  Procedure Laterality Date  . CERVICAL SPINE SURGERY    . HERNIA REPAIR    . JOINT REPLACEMENT    . LUMBAR SPINE SURGERY    . Proleive System Treatment  2006  . ROTATOR CUFF REPAIR    . THORACIC SPINE SURGERY      There were no vitals filed for this visit.      Subjective Assessment - 10/26/15 1449    Subjective Pt states he's been performing HEP focused on improving thoracic extension and balance once a day and has been waking up early in the morning to perform HEP. Reports he is starting to feel stronger when standing and balancing. Reports increased knee soreness after last treatment.    Pertinent History OCD, Neuropathy, scoliosis, pain pump located at R abdomen, Spinal Cord stimulator, 6 spinal surgeries, L  inguinal hernia 2/2 implantation of the pain pump.  43 years of quitting  alcohol., after 20 years of drinking.  Physical activity: resistance bands    Limitations Sitting;Standing;Walking   Patient Stated Goals get up less at night, sleep better,    Pain Onset More than a month ago            [facility] PT Assessment - 10/25/15 2308      Observation/Other Assessments   Observations cat cow exercise performed with excessive thoracic flexion (cued for decreased thoracic flexion)      Palpation   Palpation comment assessed through clothing: "kegels" performed without lengthening of pelvic floor and accessory use of abdominal mm       TREATMENT: TherEx: Standing Hip thrusts with scapular retraction in parallel bars - x20 Seated Cow/Cat in seating - x 15     Neuromuscular Rehab: Tai Chi grinding shoulder horizontal flexion/ext, shoulder flexion/ext - 2 x 10 on and off airex pad Standing Chopping motion  on airex - x10  Marching on airex pad with emphasis of thoracic extension - x 20  Feet together EO on Airex with UE support x45 seconds EO head turns up/down, left/right Feet together EC on airex with UE support --  x 30 seconds Feet together EO on flatground with UE support - x 30seconds Semi-tandem ambulation with crutches - 88f x 2 Tandem stepping in  bars to address dynamic balance - x 15 bilaterally         PT Education - 10/26/15 1451    Education provided Yes   Education Details Focused on maintaining thoracic extension throughout standing balance performance.    Person(s) Educated Patient   Methods Explanation;Demonstration;Tactile cues   Comprehension Verbalized understanding;Returned demonstration             PT Long Term Goals - 10/25/15 2319      PT LONG TERM GOAL #1   Title Pt and pt's dtr will report the time to get ready for bed within < 1.5 hrs in order to signify decreased falling asleep.    Time 12   Period Weeks   Status On-going     PT LONG TERM GOAL #2   Title Pt will demo proper technique for kegel with  lengthening of pelvic floor mm in order to initiate urine during day/night with less 5-10 min    Time 12   Period Weeks   Status On-going     PT LONG TERM GOAL #3   Title Pt will be able to decrease his use of his urinal in car by 50% of the time in order to travel.    Time 12   Period Weeks   Status On-going     PT LONG TERM GOAL #4   Title Pt will decrease his score on NIH-CPSI from 49% to < 44% in order to demo improved ability to urinate will less difficulty.    Time 12   Period Weeks   Status On-going     PT LONG TERM GOAL #5   Title Pt will demo decreased PSQI from 71% to < 61% in order to demo improved QOL   Time 12   Period Weeks   Status On-going     PT LONG TERM GOAL #6   Title Patient will score a <16 sec on the TUG to demonstrate significant improvement for decreasing fall risk   Baseline 19.16sec   Time 12   Period Weeks   Status New     PT LONG TERM GOAL #7   Title Patient will score <14sec on the five time sit to stand to demonstrate significant improvement in LE strength and improved ability to transfer from sitting to standing   Baseline 16.6 sec   Time 12   Period Weeks   Status New     PT LONG TERM GOAL #8   Title Patient will score >107ms on the 10 m walk test to more safely ambulate across the street   Baseline .58 m/s   Time 12   Period Weeks   Status New               Plan - 10/26/15 1453    Clinical Impression Statement Continued to focus on improving thoracic extension throughout exercise performance and patient demonstrated decreased kyphosis with exercise indicating functional carryover between visits. Patient demonstrates decreased UE use when performing balance activities, indicating improved balance. Although patient is improving, he continues to demonstrate decreased balance with tendency to lose balance posteriorly and patient will benefit from further skilled therapy to return to prior level of function.    Rehab Potential Good    Clinical Impairments Affecting Rehab Potential scoliosis, multiple surgeries, psychological factors (anxiety, OCD), sleep disorder   PT Frequency 1x / week   PT Duration 12 weeks   PT Treatment/Interventions ADLs/Self Care Home Management;Aquatic Therapy;Moist Heat;Traction;Gait training;Stair training;Functional mobility training;Neuromuscular re-education;Balance training;Therapeutic  activities;Patient/family education;Manual lymph drainage;Scar mobilization;Therapeutic exercise;Cryotherapy;Energy conservation;Other (comment)  relaxation techniques   PT Next Visit Plan Static and dynamic balance interventions, possibility of using platform walker   PT Home Exercise Plan Ambulating with walking sticks    Consulted and Agree with Plan of Care Patient      Patient will benefit from skilled therapeutic intervention in order to improve the following deficits and impairments:  Abnormal gait, Decreased activity tolerance, Decreased coordination, Decreased safety awareness, Decreased strength, Postural dysfunction, Improper body mechanics, Decreased scar mobility, Decreased range of motion, Decreased mobility, Decreased balance, Hypomobility, Pain  Visit Diagnosis: Other abnormalities of gait and mobility  Other lack of coordination  Unsteadiness on feet     Problem List Patient Active Problem List   Diagnosis Date Noted  . Urinary urgency 05/27/2015  . Hypogonadism in male 05/27/2015  . Urinary frequency 04/15/2015  . BPH with obstruction/lower urinary tract symptoms 04/15/2015  . Abdominal pain 07/23/2014  . Abnormal gait 07/23/2014  . Absolute anemia 07/23/2014  . Cognitive disorder 07/23/2014  . Narrowing of intervertebral disc space 07/23/2014  . Clinical depression 07/23/2014  . Acid reflux 07/23/2014  . Abdominal hernia 07/23/2014  . HLD (hyperlipidemia) 07/23/2014  . BP (high blood pressure) 07/23/2014  . Amnesia 07/23/2014  . 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 Scoliosis  07/23/2014  . Compressed spine fracture ([place]) 07/23/2014  . Spinal stenosis 07/23/2014  . Facial numbness 06/03/2014  . Spasm 04/09/2014  . Amnestic syndrome, drug-induced ([place]) 12/08/2013  . Obstructive apnea 12/08/2013  . Generalized OA 11/19/2013  . Bad memory 11/19/2013  . Foot pain 09/04/2013  . Polypharmacy 08/20/2013  . 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 Abnormal finding on thyroid function test 07/30/2013  . Daytime somnolence 07/30/2013  . Encounter for screening for lipoid disorders 07/30/2013  . OP (osteoporosis) 07/30/2013  . Chronic pain 06/29/2013  . Cannot sleep 06/29/2013  . Dorsopathy 06/29/2013  . Dermatitis, stasis 06/29/2013  . Blue color skin 06/05/2013  . Excessive falling 06/05/2013  . Able to mobilize using mobility aids 06/05/2013  . Curvature of spine 10/10/2012  . Acquired deformities of spine 10/10/2012  . 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 FOM (frequency of micturition) 08/21/2011  . Cervical spondylosis without myelopathy 05/03/2011  . Cervical osteoarthritis 05/03/2011  . DDD (degenerative disc disease), lumbosacral 05/03/2011  . Angulation of spine 05/03/2011  . LBP (low back pain) 05/03/2011  . Degenerative arthritis of lumbar spine 05/03/2011  . Lumbosacral spondylosis 05/03/2011  . Back pain, chronic 03/29/2011  . Benign fibroma of prostate 03/29/2011  . Urgency of micturation 03/29/2011  . Hernia, incisional 08/21/2010  . Exomphalos 08/21/2010  . Arthritis 08/07/2010  . CN (constipation) 08/07/2010  . Exposure to radiation 03/15/1953    Blythe Stanford, PT DPT 10/26/2015, 2:59 PM  [place] MAIN [facility] SERVICES [address] [place], [place], [address] Phone: [phone]   Fax:  [phone]  Name: Olman Yono MRN: [id number] Date of Birth: [birth year]

## 2015-10-31 ENCOUNTER — Ambulatory Visit: Payer: Medicare Other | Admitting: Physical Therapy

## 2015-10-31 ENCOUNTER — Encounter: Payer: Self-pay | Admitting: Physical Therapy

## 2015-10-31 DIAGNOSIS — R278 Other lack of coordination: Secondary | ICD-10-CM

## 2015-10-31 DIAGNOSIS — R2681 Unsteadiness on feet: Secondary | ICD-10-CM

## 2015-10-31 DIAGNOSIS — R2689 Other abnormalities of gait and mobility: Secondary | ICD-10-CM | POA: Diagnosis not present

## 2015-10-31 NOTE — Therapy (Signed)
[facility] MAIN [facility] [address] [place], [place], [address] Phone: [phone]   Fax:  [phone]  Physical Therapy Treatment  Patient Details  Name: Dustin LainJohn David Turner MRN: [id number] Date of Birth: 04/25/1935 Referring Provider: Orene DesanctisKaren Behling, MD  Encounter Date: 10/31/2015      PT End of Session - 10/31/15 1615    Visit Number 7   Number of Visits 12   Date for PT Re-Evaluation 11/21/15   Authorization Type  (7 G-code)   Authorization Time Period 10   PT Start Time 1516   PT Stop Time 1556   PT Time Calculation (min) 40 min   Equipment Utilized During Treatment Gait belt   Activity Tolerance Patient tolerated treatment well;No increased pain   Behavior During Therapy WFL for tasks assessed/performed      Past Medical History:  Diagnosis Date  . Arthritis   . BPH (benign prostatic hyperplasia)   . DDD (degenerative disc disease), cervical   . Hypogonadism in male   . Insomnia   . Neurogenic bladder   . OP (osteoporosis)   . Scoliosis   . Spinal stenosis   . Urinary urgency   . Venous stasis     Past Surgical History:  Procedure Laterality Date  . CERVICAL SPINE SURGERY    . HERNIA REPAIR    . JOINT REPLACEMENT    . LUMBAR SPINE SURGERY    . Proleive System Treatment  2006  . ROTATOR CUFF REPAIR    . THORACIC SPINE SURGERY      There were no vitals filed for this visit.      Subjective Assessment - 10/31/15 1526    Subjective Patient states he is doing well today but his knee pain is so bad that he does not want to do any standing activities today. He states he is still waking up in the middle in the night due to bladder and knee pain. He denies falls. He states he is more unsteady on his feet. The patient states he has been using 3 pumps of his 10 mg dilaudid after his daily dose due to his knee pain. He states he is getting a radiation ablation for his knee next week.    Pertinent History OCD,  Neuropathy, scoliosis, pain pump located at R abdomen, Spinal Cord stimulator, 6 spinal surgeries, L  inguinal hernia 2/2 implantation of the pain pump.  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  Physical activity: resistance bands    Limitations Sitting;Standing;Walking   Patient Stated Goals get up less at night, sleep better,    Currently in Pain? Yes   Pain Score 7    Pain Location Knee   Pain Orientation Right   Pain Descriptors / Indicators Aching   Pain Type Chronic pain   Pain Onset More than a month ago      Treatment:  Patient c/o decreased sleeping ability due to pain. Instructed to continue with sleeping program and continue with pillow underneath knees from previous PT but he could change the pillow height if it helped to decrease knee pain Seated cat/cow, 2x10, min VCs to not move into a full slumped position due to focus on postural muscles Seated cervical retractions, 2x10, min VCs for how to perform exercise appropriately initially and to move into a smaller range. Seated scapular retractions, yellow tband, min VCs for initial appropriate posture and squeezing of shoulder blades Supine opposite arm and leg extension; x10, x5,  x10 (even after instruction to take breaks), patient states he is performing all 15 reps at one time, instructed to take breaks at home due to giving the muscles a break and the ability to contract more fully after rest breaks. Sidelying clam shells with UE movement, 2x10, min VCs to keep feet together and continuing move L knee as well as focusing on breathing in the appropriate sequence.  Supine opening posture, instructed patient to push hands into mat table and perform opening of chest muscles, x10, instructed to hold for 2 seconds each time and continue with breathing upon the movement Instructed to ambulate, ~20 meters, with crutches, min VCs to maintain crutches closer to body in order to improve his upright posture. Instructed  patient to use crutches at home more than cane in order to decrease the stress placed on his R knee and improve his safety. Patient fatigues quickly and desires to sit due to increased knee pain. MinA with door when using crutches due to difficulty with balance with both crutches under each arm.                             PT Education - 10/31/15 1610    Education provided Yes   Education Details resting between exercises, improving seated posture   Person(s) Educated Patient   Methods Explanation   Comprehension Verbalized understanding             PT Long Term Goals - 10/25/15 2319      PT LONG TERM GOAL #1   Title Pt and pt's dtr will report the time to get ready for bed within < 1.5 hrs in order to signify decreased falling asleep.    Time 12   Period Weeks   Status On-going     PT LONG TERM GOAL #2   Title Pt will demo proper technique for kegel with lengthening of pelvic floor mm in order to initiate urine during day/night with less 5-10 min    Time 12   Period Weeks   Status On-going     PT LONG TERM GOAL #3   Title Pt will be able to decrease his use of his urinal in car by 50% of the time in order to travel.    Time 12   Period Weeks   Status On-going     PT LONG TERM GOAL #4   Title Pt will decrease his score on NIH-CPSI from 49% to < 44% in order to demo improved ability to urinate will less difficulty.    Time 12   Period Weeks   Status On-going     PT LONG TERM GOAL #5   Title Pt will demo decreased PSQI from 71% to < 61% in order to demo improved QOL   Time 12   Period Weeks   Status On-going     PT LONG TERM GOAL #6   Title Patient will score a <16 sec on the TUG to demonstrate significant improvement for decreasing fall risk   Baseline 19.16sec   Time 12   Period Weeks   Status New     PT LONG TERM GOAL #7   Title Patient will score <14sec on the five time sit to stand to demonstrate significant improvement in LE strength  and improved ability to transfer from sitting to standing   Baseline 16.6 sec   Time 12   Period Weeks   Status New  PT LONG TERM GOAL #8   Title Patient will score >46m/s on the 10 m walk test to more safely ambulate across the street   Baseline .58 m/s   Time 12   Period Weeks   Status New               Plan - 10/31/15 1616    Clinical Impression Statement Pt reports to PT and states that he has been in so much knee pain that he is not going to do any standing exercises today and almost canceled. He was instructed to perform seated posture focus and strengthening exercises. He stated that he wanted PT to assess his HEP including clam shells and contralateral arm/leg raises. He requires min VCs to rest in between his sets at home and to continue with feet placed together during clam shells. Min VCs to remember how to perform log roll bed mobility. Patient would continue to benefit from skilled PT in order to address posture, balance and safety with activities.    Rehab Potential Good   Clinical Impairments Affecting Rehab Potential scoliosis, multiple surgeries, psychological factors (anxiety, OCD), sleep disorder   PT Frequency 1x / week   PT Duration 12 weeks   PT Treatment/Interventions ADLs/Self Care Home Management;Aquatic Therapy;Moist Heat;Traction;Gait training;Stair training;Functional mobility training;Neuromuscular re-education;Balance training;Therapeutic activities;Patient/family education;Manual lymph drainage;Scar mobilization;Therapeutic exercise;Cryotherapy;Energy conservation;Other (comment)  relaxation techniques   PT Next Visit Plan Static and dynamic balance interventions, possibility of using platform walker   PT Home Exercise Plan Ambulating with walking sticks    Consulted and Agree with Plan of Care Patient      Patient will benefit from skilled therapeutic intervention in order to improve the following deficits and impairments:  Abnormal gait,  Decreased activity tolerance, Decreased coordination, Decreased safety awareness, Decreased strength, Postural dysfunction, Improper body mechanics, Decreased scar mobility, Decreased range of motion, Decreased mobility, Decreased balance, Hypomobility, Pain  Visit Diagnosis: Other lack of coordination  Unsteadiness on feet     Problem List Patient Active Problem List   Diagnosis Date Noted  . Urinary urgency 05/27/2015  . Hypogonadism in male 05/27/2015  . Urinary frequency 04/15/2015  . BPH with obstruction/lower urinary tract symptoms 04/15/2015  . Abdominal pain 07/23/2014  . Abnormal gait 07/23/2014  . Absolute anemia 07/23/2014  . Cognitive disorder 07/23/2014  . Narrowing of intervertebral disc space 07/23/2014  . Clinical depression 07/23/2014  . Acid reflux 07/23/2014  . Abdominal hernia 07/23/2014  . HLD (hyperlipidemia) 07/23/2014  . BP (high blood pressure) 07/23/2014  . Amnesia 07/23/2014  . 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 Scoliosis 07/23/2014  . Compressed spine fracture (HCC) 07/23/2014  . Spinal stenosis 07/23/2014  . Facial numbness 06/03/2014  . Spasm 04/09/2014  . Amnestic syndrome, drug-induced (HCC) 12/08/2013  . Obstructive apnea 12/08/2013  . Generalized OA 11/19/2013  . Bad memory 11/19/2013  . Foot pain 09/04/2013  . Polypharmacy 08/20/2013  . 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 Abnormal finding on thyroid function test 07/30/2013  . Daytime somnolence 07/30/2013  . Encounter for screening for lipoid disorders 07/30/2013  . OP (osteoporosis) 07/30/2013  . Chronic pain 06/29/2013  . Cannot sleep 06/29/2013  . Dorsopathy 06/29/2013  . Dermatitis, stasis 06/29/2013  . Blue color skin 06/05/2013  . Excessive falling 06/05/2013  . Able to mobilize using mobility aids 06/05/2013  . Curvature of spine 10/10/2012  . Acquired deformities of spine 10/10/2012  . 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 FOM (frequency  of micturition)  08/21/2011  . Cervical spondylosis without myelopathy 05/03/2011  . Cervical osteoarthritis 05/03/2011  . DDD (degenerative disc disease), lumbosacral 05/03/2011  . Angulation of spine 05/03/2011  . LBP (low back pain) 05/03/2011  . Degenerative arthritis of lumbar spine 05/03/2011  . Lumbosacral spondylosis 05/03/2011  . Back pain, chronic 03/29/2011  . Benign fibroma of prostate 03/29/2011  . Urgency of micturation 03/29/2011  . Hernia, incisional 08/21/2010  . Exomphalos 08/21/2010  . Arthritis 08/07/2010  . CN (constipation) 08/07/2010  . Exposure to radiation 03/15/1953   Trula Ore, SPT  This entire session was performed under direct supervision and direction of a licensed therapist/therapist assistant . I have personally read, edited and approve of the note as written.  Encarnacion Chu PT, DPT 10/31/2015, 5:35 PM  [facility] MAIN [facility] SERVICES [address] [place], [place], [address] Phone: [phone]   Fax:  [phone]  Name: Dustin Turner MRN: [id number] Date of Birth: 1935-09-30

## 2015-11-02 ENCOUNTER — Ambulatory Visit: Payer: Medicare Other

## 2015-11-02 DIAGNOSIS — R2681 Unsteadiness on feet: Secondary | ICD-10-CM

## 2015-11-02 DIAGNOSIS — R278 Other lack of coordination: Secondary | ICD-10-CM

## 2015-11-02 DIAGNOSIS — R2689 Other abnormalities of gait and mobility: Secondary | ICD-10-CM | POA: Diagnosis not present

## 2015-11-02 DIAGNOSIS — Z9181 History of falling: Secondary | ICD-10-CM

## 2015-11-03 NOTE — Therapy (Signed)
[facility] [address] [place], [place], [address] Phone: [phone]   Fax:  [phone]  Physical Therapy Treatment  Patient Details  Name: Dustin Turner MRN: [id number] Date of Birth: 03/12/1935 Referring Provider: Orene DesanctisKaren Behling, MD  Encounter Date: 11/02/2015      PT End of Session - 11/02/15 1708    Visit Number 8   Number of Visits 12   Date for PT Re-Evaluation 11/21/15   Authorization Type  (8 G-code)   Authorization Time Period 10   PT Start Time 1430   PT Stop Time 1515   PT Time Calculation (min) 45 min   Equipment Utilized During Treatment Gait belt   Activity Tolerance Patient tolerated treatment well;No increased pain   Behavior During Therapy WFL for tasks assessed/performed      Past Medical History:  Diagnosis Date  . Arthritis   . BPH (benign prostatic hyperplasia)   . DDD (degenerative disc disease), cervical   . Hypogonadism in male   . Insomnia   . Neurogenic bladder   . OP (osteoporosis)   . Scoliosis   . Spinal stenosis   . Urinary urgency   . Venous stasis     Past Surgical History:  Procedure Laterality Date  . CERVICAL SPINE SURGERY    . HERNIA REPAIR    . JOINT REPLACEMENT    . LUMBAR SPINE SURGERY    . Proleive System Treatment  2006  . ROTATOR CUFF REPAIR    . THORACIC SPINE SURGERY      There were no vitals filed for this visit.      Subjective Assessment - 11/02/15 1441    Subjective Patient states his knee is hurting him stating he doesnt wish to perform any standing exercises.    Pertinent History OCD, Neuropathy, scoliosis, pain pump located at R abdomen, Spinal Cord stimulator, 6 spinal surgeries, L  inguinal hernia 2/2 implantation of the pain pump.  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  Physical activity: resistance bands    Limitations Sitting;Standing;Walking   Patient Stated Goals get up less at night, sleep better,    Currently  in Pain? Yes   Pain Score 7    Pain Location Knee   Pain Orientation Right   Pain Descriptors / Indicators Aching   Pain Type Chronic pain   Pain Onset More than a month ago      TREATMENT Therapeutic EXS: Cat/cow in sitting - x 15 with tactile cueing along thoracic extensors Thoracic extension - x 15 with tactile and verbal cueing on muscular activation  Ball roll outs in sitting with ball under foot - x15 to improve knee mobility and comfort Sitting thoracic rotations with ball in hand - x 10 in each direction - stopped due to back spasm Sitting thoracic D1/D2 patterns - Bilaterally x10 to improve thoracic mobility  Laying down overhead thoracic extension in supine - 2 x 15  Scapular retraction with green theraband in sitting - 2 x 15 Bench press -- #2, #5, 15# x 15 each set to improve serratus anterior activation SLR with #10 over the upper thigh - 2 x 10  Bridges in hookyling - 2 x 15 with cueing on improving activation of glutes *All exercises performed in non-weight bearing positioning secondary to increased knee pain       PT Education - 11/02/15 1707    Education provided Yes   Education Details Educated on Retail buyerjoint mechanics  and form/technique   Person(s) Educated Patient   Methods Explanation;Demonstration   Comprehension Verbalized understanding;Returned demonstration             PT Long Term Goals - 10/25/15 2319      PT LONG TERM GOAL #1   Title Pt and pt's dtr will report the time to get ready for bed within < 1.5 hrs in order to signify decreased falling asleep.    Time 12   Period Weeks   Status On-going     PT LONG TERM GOAL #2   Title Pt will demo proper technique for kegel with lengthening of pelvic floor mm in order to initiate urine during day/night with less 5-10 min    Time 12   Period Weeks   Status On-going     PT LONG TERM GOAL #3   Title Pt will be able to decrease his use of his urinal in car by 50% of the time in order to travel.     Time 12   Period Weeks   Status On-going     PT LONG TERM GOAL #4   Title Pt will decrease his score on NIH-CPSI from 49% to < 44% in order to demo improved ability to urinate will less difficulty.    Time 12   Period Weeks   Status On-going     PT LONG TERM GOAL #5   Title Pt will demo decreased PSQI from 71% to < 61% in order to demo improved QOL   Time 12   Period Weeks   Status On-going     PT LONG TERM GOAL #6   Title Patient will score a <16 sec on the TUG to demonstrate significant improvement for decreasing fall risk   Baseline 19.16sec   Time 12   Period Weeks   Status New     PT LONG TERM GOAL #7   Title Patient will score <14sec on the five time sit to stand to demonstrate significant improvement in LE strength and improved ability to transfer from sitting to standing   Baseline 16.6 sec   Time 12   Period Weeks   Status New     PT LONG TERM GOAL #8   Title Patient will score >41m/s on the 10 m walk test to more safely ambulate across the street   Baseline .58 m/s   Time 12   Period Weeks   Status New               Plan - 11/03/15 0930    Clinical Impression Statement Patient reports he's had increased knee pain and doesnt want to perform any standing exercises today. Patient demonstrates improved thoracic extension and posture during today's visit indicating functional carryover between visits. Although patient is improving, he continues to demonstrate increased fall risk and decreased endurance/coordination with exercise; patient will benefit from further skilled therapy to return to prior level of function.    Rehab Potential Good   Clinical Impairments Affecting Rehab Potential scoliosis, multiple surgeries, psychological factors (anxiety, OCD), sleep disorder   PT Frequency 1x / week   PT Duration 12 weeks   PT Treatment/Interventions ADLs/Self Care Home Management;Aquatic Therapy;Moist Heat;Traction;Gait training;Stair training;Functional mobility  training;Neuromuscular re-education;Balance training;Therapeutic activities;Patient/family education;Manual lymph drainage;Scar mobilization;Therapeutic exercise;Cryotherapy;Energy conservation;Other (comment)  relaxation techniques   PT Next Visit Plan Static and dynamic balance interventions, possibility of using platform walker   PT Home Exercise Plan Ambulating with walking sticks    Consulted and Agree with Plan  of Care Patient      Patient will benefit from skilled therapeutic intervention in order to improve the following deficits and impairments:  Abnormal gait, Decreased activity tolerance, Decreased coordination, Decreased safety awareness, Decreased strength, Postural dysfunction, Improper body mechanics, Decreased scar mobility, Decreased range of motion, Decreased mobility, Decreased balance, Hypomobility, Pain  Visit Diagnosis: Other lack of coordination  Unsteadiness on feet  History of falling     Problem List Patient Active Problem List   Diagnosis Date Noted  . Urinary urgency 05/27/2015  . Hypogonadism in male 05/27/2015  . Urinary frequency 04/15/2015  . BPH with obstruction/lower urinary tract symptoms 04/15/2015  . Abdominal pain 07/23/2014  . Abnormal gait 07/23/2014  . Absolute anemia 07/23/2014  . Cognitive disorder 07/23/2014  . Narrowing of intervertebral disc space 07/23/2014  . Clinical depression 07/23/2014  . Acid reflux 07/23/2014  . Abdominal hernia 07/23/2014  . HLD (hyperlipidemia) 07/23/2014  . BP (high blood pressure) 07/23/2014  . Amnesia 07/23/2014  . 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 Scoliosis 07/23/2014  . Compressed spine fracture (HCC) 07/23/2014  . Spinal stenosis 07/23/2014  . Facial numbness 06/03/2014  . Spasm 04/09/2014  . Amnestic syndrome, drug-induced (HCC) 12/08/2013  . Obstructive apnea 12/08/2013  . Generalized OA 11/19/2013  . Bad memory 11/19/2013  . Foot pain 09/04/2013  . Polypharmacy 08/20/2013  . Idiopathic  scoliosis and kyphoscoliosis 08/20/2013  . Abnormal finding on thyroid function test 07/30/2013  . Daytime somnolence 07/30/2013  . Encounter for screening for lipoid disorders 07/30/2013  . OP (osteoporosis) 07/30/2013  . Chronic pain 06/29/2013  . Cannot sleep 06/29/2013  . Dorsopathy 06/29/2013  . Dermatitis, stasis 06/29/2013  . Blue color skin 06/05/2013  . Excessive falling 06/05/2013  . Able to mobilize using mobility aids 06/05/2013  . Curvature of spine 10/10/2012  . Acquired deformities of spine 10/10/2012  . 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 FOM (frequency of micturition) 08/21/2011  . Cervical spondylosis without myelopathy 05/03/2011  . Cervical osteoarthritis 05/03/2011  . DDD (degenerative disc disease), lumbosacral 05/03/2011  . Angulation of spine 05/03/2011  . LBP (low back pain) 05/03/2011  . Degenerative arthritis of lumbar spine 05/03/2011  . Lumbosacral spondylosis 05/03/2011  . Back pain, chronic 03/29/2011  . Benign fibroma of prostate 03/29/2011  . Urgency of micturation 03/29/2011  . Hernia, incisional 08/21/2010  . Exomphalos 08/21/2010  . Arthritis 08/07/2010  . CN (constipation) 08/07/2010  . Exposure to radiation 03/15/1953    Myrene Galas, PT DPT 11/03/2015, 9:42 AM  [facility] MAIN [facility] SERVICES [address] [place], [place], [address] Phone: [phone]   Fax:  [phone]  Name: Dustin Turner MRN: [id number] Date of Birth: 06-Nov-1935

## 2015-11-07 ENCOUNTER — Ambulatory Visit: Payer: Medicare Other | Admitting: Physical Therapy

## 2015-11-07 ENCOUNTER — Ambulatory Visit: Payer: Medicare Other

## 2015-11-07 DIAGNOSIS — Z9181 History of falling: Secondary | ICD-10-CM

## 2015-11-07 DIAGNOSIS — R2681 Unsteadiness on feet: Secondary | ICD-10-CM

## 2015-11-07 DIAGNOSIS — R278 Other lack of coordination: Secondary | ICD-10-CM

## 2015-11-07 DIAGNOSIS — R2689 Other abnormalities of gait and mobility: Secondary | ICD-10-CM

## 2015-11-07 NOTE — Therapy (Signed)
[place] MAIN [facility] SERVICES [address] [place], [place], [address] Phone: [phone]   Fax:  [phone]  Physical Therapy Treatment  Patient Details  Name: Dustin Turner MRN: [id number] Date of Birth: October 22, 1935 Referring Provider: Barbaraann Boys, MD  Encounter Date: 11/07/2015      PT End of Session - 11/07/15 1519    Visit Number 9   Number of Visits 12   Date for PT Re-Evaluation 11/21/15   Authorization Type  (9 G-code)   Authorization Time Period 10   PT Start Time 1400   PT Stop Time 1445   PT Time Calculation (min) 45 min   Equipment Utilized During Treatment Gait belt   Activity Tolerance Patient tolerated treatment well;No increased pain   Behavior During Therapy WFL for tasks assessed/performed      Past Medical History:  Diagnosis Date  . Arthritis   . BPH (benign prostatic hyperplasia)   . DDD (degenerative disc disease), cervical   . Hypogonadism in male   . Insomnia   . Neurogenic bladder   . OP (osteoporosis)   . Scoliosis   . Spinal stenosis   . Urinary urgency   . Venous stasis     Past Surgical History:  Procedure Laterality Date  . CERVICAL SPINE SURGERY    . HERNIA REPAIR    . JOINT REPLACEMENT    . LUMBAR SPINE SURGERY    . Proleive System Treatment  2006  . ROTATOR CUFF REPAIR    . THORACIC SPINE SURGERY      There were no vitals filed for this visit.      Subjective Assessment - 11/07/15 1417    Subjective Pt reports increased fatigue from previous treatment the hour before. States he's had increased knee pain and weight bearing is extremely painful. Reports he is having a nerve abalation to the knee performed on Wednesday.   Pertinent History OCD, Neuropathy, scoliosis, pain pump located at R abdomen, Spinal Cord stimulator, 6 spinal surgeries, L  inguinal hernia 2/2 implantation of the pain pump.  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  Physical activity:  resistance bands    Limitations Sitting;Standing;Walking   Patient Stated Goals get up less at night, sleep better,    Currently in Pain? Yes   Pain Score 7    Pain Location Knee   Pain Orientation Right   Pain Descriptors / Indicators Aching   Pain Type Chronic pain   Pain Onset More than a month ago      TREATMENT: Therapeutic Exercise: All exercises were performed in sitting* Grinding corn Tai Chi arms into Forearm abduction - 2 x 20 repetitions Follow the moon Tai Chi arms over head into and out of shoulder abduction -- 2 x 20 repetitions Unilateral shoulder flexion in sitting - 2 x 20 repetitions Pulling apart with arms above head - 2 x 20 repetitions Pushing apart with arms above head - 2 x 20 repetitions Pushing apart with arms at waist level - 2 x 20 repetitions Thoracic Extension in sitting - 2 x 20 repetitions Screened R wrist for tendonitis and pain.         PT Education - 11/07/15 1518    Education provided Yes   Education Details Educated on pain science   Person(s) Educated Patient   Methods Explanation;Demonstration   Comprehension Verbalized understanding;Returned demonstration             PT Long Term Goals -  10/25/15 2319      PT LONG TERM GOAL #1   Title Pt and pt's dtr will report the time to get ready for bed within < 1.5 hrs in order to signify decreased falling asleep.    Time 12   Period Weeks   Status On-going     PT LONG TERM GOAL #2   Title Pt will demo proper technique for kegel with lengthening of pelvic floor mm in order to initiate urine during day/night with less 5-10 min    Time 12   Period Weeks   Status On-going     PT LONG TERM GOAL #3   Title Pt will be able to decrease his use of his urinal in car by 50% of the time in order to travel.    Time 12   Period Weeks   Status On-going     PT LONG TERM GOAL #4   Title Pt will decrease his score on NIH-CPSI from 49% to < 44% in order to demo improved ability to urinate will  less difficulty.    Time 12   Period Weeks   Status On-going     PT LONG TERM GOAL #5   Title Pt will demo decreased PSQI from 71% to < 61% in order to demo improved QOL   Time 12   Period Weeks   Status On-going     PT LONG TERM GOAL #6   Title Patient will score a <16 sec on the TUG to demonstrate significant improvement for decreasing fall risk   Baseline 19.16sec   Time 12   Period Weeks   Status New     PT LONG TERM GOAL #7   Title Patient will score <14sec on the five time sit to stand to demonstrate significant improvement in LE strength and improved ability to transfer from sitting to standing   Baseline 16.6 sec   Time 12   Period Weeks   Status New     PT LONG TERM GOAL #8   Title Patient will score >44ms on the 10 m walk test to more safely ambulate across the street   Baseline .58 m/s   Time 12   Period Weeks   Status New               Plan - 11/07/15 1520    Clinical Impression Statement Patient reports increased knee pain today therefore avoided weight bearing activities to not aggravate. Focused on performing Tai Chi and thoracic mobility exercises and patient will benefit from further skilled therapy to return to prior level of function.    Rehab Potential Good   Clinical Impairments Affecting Rehab Potential scoliosis, multiple surgeries, psychological factors (anxiety, OCD), sleep disorder   PT Frequency 1x / week   PT Duration 12 weeks   PT Treatment/Interventions ADLs/Self Care Home Management;Aquatic Therapy;Moist Heat;Traction;Gait training;Stair training;Functional mobility training;Neuromuscular re-education;Balance training;Therapeutic activities;Patient/family education;Manual lymph drainage;Scar mobilization;Therapeutic exercise;Cryotherapy;Energy conservation;Other (comment)  relaxation techniques   PT Next Visit Plan Static and dynamic balance interventions, possibility of using platform walker   PT Home Exercise Plan Ambulating with  walking sticks    Consulted and Agree with Plan of Care Patient      Patient will benefit from skilled therapeutic intervention in order to improve the following deficits and impairments:  Abnormal gait, Decreased activity tolerance, Decreased coordination, Decreased safety awareness, Decreased strength, Postural dysfunction, Improper body mechanics, Decreased scar mobility, Decreased range of motion, Decreased mobility, Decreased balance, Hypomobility, Pain  Visit  Diagnosis: Other lack of coordination  Unsteadiness on feet  History of falling     Problem List Patient Active Problem List   Diagnosis Date Noted  . Urinary urgency 05/27/2015  . Hypogonadism in male 05/27/2015  . Urinary frequency 04/15/2015  . BPH with obstruction/lower urinary tract symptoms 04/15/2015  . Abdominal pain 07/23/2014  . Abnormal gait 07/23/2014  . Absolute anemia 07/23/2014  . Cognitive disorder 07/23/2014  . Narrowing of intervertebral disc space 07/23/2014  . Clinical depression 07/23/2014  . Acid reflux 07/23/2014  . Abdominal hernia 07/23/2014  . HLD (hyperlipidemia) 07/23/2014  . BP (high blood pressure) 07/23/2014  . Amnesia 07/23/2014  . 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 Scoliosis 07/23/2014  . Compressed spine fracture ([place]) 07/23/2014  . Spinal stenosis 07/23/2014  . Facial numbness 06/03/2014  . Spasm 04/09/2014  . Amnestic syndrome, drug-induced ([place]) 12/08/2013  . Obstructive apnea 12/08/2013  . Generalized OA 11/19/2013  . Bad memory 11/19/2013  . Foot pain 09/04/2013  . Polypharmacy 08/20/2013  . 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 Abnormal finding on thyroid function test 07/30/2013  . Daytime somnolence 07/30/2013  . Encounter for screening for lipoid disorders 07/30/2013  . OP (osteoporosis) 07/30/2013  . Chronic pain 06/29/2013  . Cannot sleep 06/29/2013  . Dorsopathy 06/29/2013  . Dermatitis, stasis 06/29/2013  . Blue color skin 06/05/2013  .  Excessive falling 06/05/2013  . Able to mobilize using mobility aids 06/05/2013  . Curvature of spine 10/10/2012  . Acquired deformities of spine 10/10/2012  . 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 FOM (frequency of micturition) 08/21/2011  . Cervical spondylosis without myelopathy 05/03/2011  . Cervical osteoarthritis 05/03/2011  . DDD (degenerative disc disease), lumbosacral 05/03/2011  . Angulation of spine 05/03/2011  . LBP (low back pain) 05/03/2011  . Degenerative arthritis of lumbar spine 05/03/2011  . Lumbosacral spondylosis 05/03/2011  . Back pain, chronic 03/29/2011  . Benign fibroma of prostate 03/29/2011  . Urgency of micturation 03/29/2011  . Hernia, incisional 08/21/2010  . Exomphalos 08/21/2010  . Arthritis 08/07/2010  . CN (constipation) 08/07/2010  . Exposure to radiation 03/15/1953    Blythe Stanford, PT DPT 11/07/2015, 3:29 PM  [place] MAIN [facility] SERVICES [address] [place], [place], [address] Phone: [phone]   Fax:  [phone]  Name: Dustin Turner MRN: [id number] Date of Birth: 15-Oct-1935

## 2015-11-08 NOTE — Therapy (Signed)
[place] [facility] MAIN [facility] SERVICES [address] [place], [place], [address] Phone: [phone]   Fax:  [phone]  Physical Therapy Treatment  Patient Details  Name: Dustin Turner MRN: [id number] Date of Birth: 12/24/1935 Referring Provider: Orene DesanctisKaren Behling, MD  Encounter Date: 11/07/2015      PT End of Session - 11/08/15 2146    Visit Number 10   Number of Visits 12   Date for PT Re-Evaluation 11/21/15   Authorization Type  (10 G-code)   Authorization Time Period 10   PT Start Time 1305   PT Stop Time 1400   PT Time Calculation (min) 55 min   Equipment Utilized During Treatment Gait belt   Activity Tolerance Patient tolerated treatment well;No increased pain   Behavior During Therapy WFL for tasks assessed/performed      Past Medical History:  Diagnosis Date  . Arthritis   . BPH (benign prostatic hyperplasia)   . DDD (degenerative disc disease), cervical   . Hypogonadism in male   . Insomnia   . Neurogenic bladder   . OP (osteoporosis)   . Scoliosis   . Spinal stenosis   . Urinary urgency   . Venous stasis     Past Surgical History:  Procedure Laterality Date  . CERVICAL SPINE SURGERY    . HERNIA REPAIR    . JOINT REPLACEMENT    . LUMBAR SPINE SURGERY    . Proleive System Treatment  2006  . ROTATOR CUFF REPAIR    . THORACIC SPINE SURGERY      There were no vitals filed for this visit.      Subjective Assessment - 11/08/15 2148    Subjective Pt reports her urinary frequencies have decreased from 5-6 x/ night to 2-3 x/ night and false alarms to urinate have also decreased. Pt's R knee has been bothering him.    Pertinent History OCD, Neuropathy, scoliosis, pain pump located at R abdomen, Spinal Cord stimulator, 6 spinal surgeries, L  inguinal hernia 2/2 implantation of the pain pump.  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  Physical activity: resistance bands    Limitations Sitting;Standing;Walking    Patient Stated Goals get up less at night, sleep better,    Pain Onset More than a month ago            [facility] Assessment - 11/08/15 2149      Observation/Other Assessments   Observations excessive forward hip flexion in bed to chair t/f (IND)    pt appeared more relaxed post Tx                     [facility]/Exercise - 11/08/15 2149      Therapeutic Activites    Therapeutic Activities --  body scan technique, principles of relaxation                PT Education - 11/08/15 2154    Education provided Yes   Education Details HEP   Person(s) Educated Patient   Methods Explanation;Tactile cues;Demonstration;Verbal cues;Handout   Comprehension Returned demonstration;Verbalized understanding             PT Long Term Goals - 10/25/15 2319      PT LONG TERM GOAL #1   Title Pt and pt's dtr will report the time to get ready for bed within < 1.5 hrs in order to signify decreased falling asleep.    Time 12  Period Weeks   Status On-going     PT LONG TERM GOAL #2   Title Pt will demo proper technique for kegel with lengthening of pelvic floor mm in order to initiate urine during day/night with less 5-10 min    Time 12   Period Weeks   Status On-going     PT LONG TERM GOAL #3   Title Pt will be able to decrease his use of his urinal in car by 50% of the time in order to travel.    Time 12   Period Weeks   Status On-going     PT LONG TERM GOAL #4   Title Pt will decrease his score on NIH-CPSI from 49% to < 44% in order to demo improved ability to urinate will less difficulty.    Time 12   Period Weeks   Status On-going     PT LONG TERM GOAL #5   Title Pt will demo decreased PSQI from 71% to < 61% in order to demo improved QOL   Time 12   Period Weeks   Status On-going     PT LONG TERM GOAL #6   Title Patient will score a <16 sec on the TUG to demonstrate significant improvement for decreasing fall risk   Baseline  19.16sec   Time 12   Period Weeks   Status New     PT LONG TERM GOAL #7   Title Patient will score <14sec on the five time sit to stand to demonstrate significant improvement in LE strength and improved ability to transfer from sitting to standing   Baseline 16.6 sec   Time 12   Period Weeks   Status New     PT LONG TERM GOAL #8   Title Patient will score >32m/s on the 10 m walk test to more safely ambulate across the street   Baseline .58 m/s   Time 12   Period Weeks   Status New               Plan - 11/08/15 2147    Clinical Impression Statement Pt reports his urinary frequency has decreased from 5-6 x/ night to 2-3x / night and his urge has also decreased. Through motivational interviewing, pt recognized he feels overwhelmed with his medical conditions and hopes to apply his best to stay active while he is able.  Relaxation techniques/ mindfulness techniques were taught to pt per pt's interest and he appeared and voiced feeling more relaxed following training. Pt reported "feeling less overwhelmed".  Pt voiced understanding on the principles of relaxation and plans to practice at home.  Pt is progressing towards his goals.    Rehab Potential Good   Clinical Impairments Affecting Rehab Potential scoliosis, multiple surgeries, psychological factors (anxiety, OCD), sleep disorder   PT Frequency 1x / week   PT Duration 12 weeks   PT Treatment/Interventions ADLs/Self Care Home Management;Aquatic Therapy;Moist Heat;Traction;Gait training;Stair training;Functional mobility training;Neuromuscular re-education;Balance training;Therapeutic activities;Patient/family education;Manual lymph drainage;Scar mobilization;Therapeutic exercise;Cryotherapy;Energy conservation;Other (comment)  relaxation techniques   PT Next Visit Plan Static and dynamic balance interventions, possibility of using platform walker   PT Home Exercise Plan Ambulating with walking sticks    Consulted and Agree with  Plan of Care Patient      Patient will benefit from skilled therapeutic intervention in order to improve the following deficits and impairments:  Abnormal gait, Decreased activity tolerance, Decreased coordination, Decreased safety awareness, Decreased strength, Postural dysfunction, Improper body mechanics, Decreased scar mobility, Decreased  range of motion, Decreased mobility, Decreased balance, Hypomobility, Pain  Visit Diagnosis: Other lack of coordination  Unsteadiness on feet  Other abnormalities of gait and mobility  History of falling    Problem List Patient Active Problem List   Diagnosis Date Noted  . Urinary urgency 05/27/2015  . Hypogonadism in male 05/27/2015  . Urinary frequency 04/15/2015  . BPH with obstruction/lower urinary tract symptoms 04/15/2015  . Abdominal pain 07/23/2014  . Abnormal gait 07/23/2014  . Absolute anemia 07/23/2014  . Cognitive disorder 07/23/2014  . Narrowing of intervertebral disc space 07/23/2014  . Clinical depression 07/23/2014  . Acid reflux 07/23/2014  . Abdominal hernia 07/23/2014  . HLD (hyperlipidemia) 07/23/2014  . BP (high blood pressure) 07/23/2014  . Amnesia 07/23/2014  . 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 Scoliosis 07/23/2014  . Compressed spine fracture ([facility]) 07/23/2014  . Spinal stenosis 07/23/2014  . Facial numbness 06/03/2014  . Spasm 04/09/2014  . Amnestic syndrome, drug-induced ([facility]) 12/08/2013  . Obstructive apnea 12/08/2013  . Generalized OA 11/19/2013  . Bad memory 11/19/2013  . Foot pain 09/04/2013  . Polypharmacy 08/20/2013  . 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 Abnormal finding on thyroid function test 07/30/2013  . Daytime somnolence 07/30/2013  . Encounter for screening for lipoid disorders 07/30/2013  . OP (osteoporosis) 07/30/2013  . Chronic pain 06/29/2013  . Cannot sleep 06/29/2013  . Dorsopathy 06/29/2013  . Dermatitis, stasis 06/29/2013  . Blue color skin 06/05/2013  .  Excessive falling 06/05/2013  . Able to mobilize using mobility aids 06/05/2013  . Curvature of spine 10/10/2012  . Acquired deformities of spine 10/10/2012  . 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 FOM (frequency of micturition) 08/21/2011  . Cervical spondylosis without myelopathy 05/03/2011  . Cervical osteoarthritis 05/03/2011  . DDD (degenerative disc disease), lumbosacral 05/03/2011  . Angulation of spine 05/03/2011  . LBP (low back pain) 05/03/2011  . Degenerative arthritis of lumbar spine 05/03/2011  . Lumbosacral spondylosis 05/03/2011  . Back pain, chronic 03/29/2011  . Benign fibroma of prostate 03/29/2011  . Urgency of micturation 03/29/2011  . Hernia, incisional 08/21/2010  . Exomphalos 08/21/2010  . Arthritis 08/07/2010  . CN (constipation) 08/07/2010  . Exposure to radiation 03/15/1953    Mariane Masters ,PT, DPT, E-RYT  11/08/2015, 10:02 PM  [place] [facility] MAIN [facility] SERVICES [address] [place], [place], [address] Phone: [phone]   Fax:  [phone]  Name: Dustin Turner MRN: [id number] Date of Birth: [birth year]

## 2015-11-08 NOTE — Patient Instructions (Signed)
Body scan technique (audio file) emailed. Try to incorporate in the afternoon  Principles of relaxation: WARM-UP Use of towel over eyes/ears Use of 2-3 Lb ankle weights for added pressure over shoulders, blanket over body Feel the support beneath you  Body Scan (audio)   COOL DOWN after 10-15 min alarm goes off Transition slow,  Open eyes, enjoy a few more breaths here knowing you will be transitioning your body position soon Wiggle you fingers and toes,  Notice the outside world through your senses, while still staying present inside, noticing your natural breathing

## 2015-11-09 ENCOUNTER — Ambulatory Visit: Payer: Medicare Other

## 2015-11-14 DIAGNOSIS — Z7409 Other reduced mobility: Secondary | ICD-10-CM

## 2015-11-14 HISTORY — DX: Other reduced mobility: Z74.09

## 2015-11-21 ENCOUNTER — Ambulatory Visit: Payer: Medicare Other | Attending: Pediatrics | Admitting: Physical Therapy

## 2015-11-21 DIAGNOSIS — R2689 Other abnormalities of gait and mobility: Secondary | ICD-10-CM | POA: Insufficient documentation

## 2015-11-21 DIAGNOSIS — Z9181 History of falling: Secondary | ICD-10-CM | POA: Insufficient documentation

## 2015-11-21 DIAGNOSIS — R278 Other lack of coordination: Secondary | ICD-10-CM | POA: Insufficient documentation

## 2015-11-21 DIAGNOSIS — R2681 Unsteadiness on feet: Secondary | ICD-10-CM | POA: Insufficient documentation

## 2015-12-05 ENCOUNTER — Ambulatory Visit: Payer: Medicare Other | Admitting: Physical Therapy

## 2015-12-05 ENCOUNTER — Encounter: Payer: Self-pay | Admitting: Physical Therapy

## 2015-12-05 DIAGNOSIS — R278 Other lack of coordination: Secondary | ICD-10-CM

## 2015-12-05 DIAGNOSIS — Z9181 History of falling: Secondary | ICD-10-CM

## 2015-12-05 DIAGNOSIS — R2689 Other abnormalities of gait and mobility: Secondary | ICD-10-CM | POA: Diagnosis present

## 2015-12-05 DIAGNOSIS — R2681 Unsteadiness on feet: Secondary | ICD-10-CM | POA: Diagnosis present

## 2015-12-05 NOTE — Patient Instructions (Signed)
In the morning:  Perform stretches in bed before getting out of the bed  ____________________  Wall and counter stretches and strengthening 2 x day  And keep chair behind your to be ready to sit when fatigued 5 sec holds and 5 reps    Wall: 1. Feet are wide  Arms stretched out in the "V"  Count aloud 5 sec x 5 reps    2. Forearms on the wall elbows shoulder width apart like a scare crow  Chest lifts look up count aloud 5 sec, 5 reps   3. Forearms on the wall elbows shoulder width apart like a scare crow  Step one foot back 5 times to stretch hips while pressing forearms against the wall and press standing leg strong into the ground    By the kitchen counter: keep chair  4.  Feet are wide, one foot ahead of other  , facing the counter  R arm over head, high five the sky  to stretch R low back  5 reps     5. Face the counter,  one foot ahead of other, chair placed parallel to counter R palm press to counter to strengthen R low back muscles  5 sec holds counting aloud, 5 reps   Rest in chair   6. High five the sky  Elbows on chair with armrests, squeeze shoulder blades together and open palms like high five to sky, chest lifts  5 reps    ______________  To initiate urination, practice lengthening of pelvcic floor muscles on inhalation  _____________

## 2015-12-05 NOTE — Therapy (Signed)
[place] [facility] MAIN [facility] SERVICES [address] [place], [place], [address] Phone: [phone]   Fax:  [phone]  Physical Therapy Treatment  Patient Details  Name: Zykee Avakian MRN: [id number] Date of Birth: 1935/06/09 Referring Provider: Orene Desanctis, MD  Encounter Date: January 02, 2016      PT End of Session - 01-02-16 1418    Visit Number 11   Number of Visits 12   Date for PT Re-Evaluation 11/21/15   Authorization Type 1/10 (G-codes submitted 01/02/2023)   Authorization Time Period 1   PT Start Time 1405   PT Stop Time 1440   PT Time Calculation (min) 35 min   Equipment Utilized During Treatment Gait belt   Activity Tolerance Patient tolerated treatment well;No increased pain   Behavior During Therapy WFL for tasks assessed/performed      Past Medical History:  Diagnosis Date  . Arthritis   . BPH (benign prostatic hyperplasia)   . DDD (degenerative disc disease), cervical   . Hypogonadism in male   . Insomnia   . Neurogenic bladder   . OP (osteoporosis)   . Scoliosis   . Spinal stenosis   . Urinary urgency   . Venous stasis     Past Surgical History:  Procedure Laterality Date  . CERVICAL SPINE SURGERY    . HERNIA REPAIR    . JOINT REPLACEMENT    . LUMBAR SPINE SURGERY    . Proleive System Treatment  2006  . ROTATOR CUFF REPAIR    . THORACIC SPINE SURGERY      There were no vitals filed for this visit.      Subjective Assessment - 2016/01/02 1412    Subjective Pt reports that he has been experiencing spasms under his rib cage that began years ago and have worsened over the past few months and is hesitant at first to participate in PT but then is agreeable to a gentle session.  Pt reports a constant chronic Bil foot pain.     Pertinent History OCD, Neuropathy, scoliosis, pain pump located at R abdomen, Spinal Cord stimulator, 6 spinal surgeries, L  inguinal hernia 2/2 implantation of the pain pump.  43 years of quitting  alcohol., after 20 years of drinking.  Physical activity: resistance bands    Limitations Sitting;Standing;Walking   Patient Stated Goals get up less at night, sleep better,    Currently in Pain? Yes   Pain Score 6    Pain Location Foot   Pain Orientation Left;Right   Pain Descriptors / Indicators Constant;Aching   Pain Type Chronic pain   Pain Onset More than a month ago       TREATMENT   Therapeutic Exercise:  Pt supine lying on towel roll in thoracic region to promote gentle thoracic extension x15 minutes throughout supine exercises.  Supine angel wings 2x20  Supine D2 with 2lb weight, 1x15 each side with cues for scapular retraction  Supine scapular retractions x30 with 3 second holds  Supine DNF exercise with cues for correct technique and for 5 second holds  Seated Cow/Cat x 15  Pt denies increased pain during session               PT Education - 01/02/16 1417    Education provided Yes   Education Details Exercise technique, goal of therapy interventions   Person(s) Educated Patient   Methods Explanation;Demonstration;Verbal cues   Comprehension Verbalized understanding;Returned demonstration;Need further instruction  PT Long Term Goals - 10/25/15 2319      PT LONG TERM GOAL #1   Title Pt and pt's dtr will report the time to get ready for bed within < 1.5 hrs in order to signify decreased falling asleep.    Time 12   Period Weeks   Status On-going     PT LONG TERM GOAL #2   Title Pt will demo proper technique for kegel with lengthening of pelvic floor mm in order to initiate urine during day/night with less 5-10 min    Time 12   Period Weeks   Status On-going     PT LONG TERM GOAL #3   Title Pt will be able to decrease his use of his urinal in car by 50% of the time in order to travel.    Time 12   Period Weeks   Status On-going     PT LONG TERM GOAL #4   Title Pt will decrease his score on NIH-CPSI from 49% to < 44% in order to  demo improved ability to urinate will less difficulty.    Time 12   Period Weeks   Status On-going     PT LONG TERM GOAL #5   Title Pt will demo decreased PSQI from 71% to < 61% in order to demo improved QOL   Time 12   Period Weeks   Status On-going     PT LONG TERM GOAL #6   Title Patient will score a <16 sec on the TUG to demonstrate significant improvement for decreasing fall risk   Baseline 19.16sec   Time 12   Period Weeks   Status New     PT LONG TERM GOAL #7   Title Patient will score <14sec on the five time sit to stand to demonstrate significant improvement in LE strength and improved ability to transfer from sitting to standing   Baseline 16.6 sec   Time 12   Period Weeks   Status New     PT LONG TERM GOAL #8   Title Patient will score >1118m/s on the 10 m walk test to more safely ambulate across the street   Baseline .58 m/s   Time 12   Period Weeks   Status New               Plan - 12/05/15 1500    Clinical Impression Statement Pt presents to therapy session with c/o spasms under anterior rib cage and requests that this session be a gentle session and that we do not do any WBing activities that will aggravate his knee.  He tolerated all interventions well this session.  Therapeutic exercise were focused on postural correction and neuromuscular control as pt demonstrates impairments in these areas.  He will benefit from continued skilled PT interventions to address these impairments.   Rehab Potential Good   Clinical Impairments Affecting Rehab Potential scoliosis, multiple surgeries, psychological factors (anxiety, OCD), sleep disorder   PT Frequency 1x / week   PT Duration 12 weeks   PT Treatment/Interventions ADLs/Self Care Home Management;Aquatic Therapy;Moist Heat;Traction;Gait training;Stair training;Functional mobility training;Neuromuscular re-education;Balance training;Therapeutic activities;Patient/family education;Manual lymph drainage;Scar  mobilization;Therapeutic exercise;Cryotherapy;Energy conservation;Other (comment)  relaxation techniques   PT Next Visit Plan Static and dynamic balance interventions, possibility of using platform walker   PT Home Exercise Plan Ambulating with walking sticks    Consulted and Agree with Plan of Care Patient      Patient will benefit from skilled therapeutic intervention in order  to improve the following deficits and impairments:  Abnormal gait, Decreased activity tolerance, Decreased coordination, Decreased safety awareness, Decreased strength, Postural dysfunction, Improper body mechanics, Decreased scar mobility, Decreased range of motion, Decreased mobility, Decreased balance, Hypomobility, Pain  Visit Diagnosis: Other lack of coordination  Unsteadiness on feet  History of falling       G-Codes - 12/05/15 1444    Functional Assessment Tool Used Clinical Judgement, transfers, posture, ROM   Functional Limitation Mobility: Walking and moving around   Mobility: Walking and Moving Around Current Status [phone](G8978) At least 40 percent but less than 60 percent impaired, limited or restricted   Mobility: Walking and Moving Around Goal Status [phone](G8979) At least 20 percent but less than 40 percent impaired, limited or restricted      Problem List Patient Active Problem List   Diagnosis Date Noted  . Urinary urgency 05/27/2015  . Hypogonadism in male 05/27/2015  . Urinary frequency 04/15/2015  . BPH with obstruction/lower urinary tract symptoms 04/15/2015  . Abdominal pain 07/23/2014  . Abnormal gait 07/23/2014  . Absolute anemia 07/23/2014  . Cognitive disorder 07/23/2014  . Narrowing of intervertebral disc space 07/23/2014  . Clinical depression 07/23/2014  . Acid reflux 07/23/2014  . Abdominal hernia 07/23/2014  . HLD (hyperlipidemia) 07/23/2014  . BP (high blood pressure) 07/23/2014  . Amnesia 07/23/2014  . 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 Scoliosis 07/23/2014  . Compressed  spine fracture (HCC) 07/23/2014  . Spinal stenosis 07/23/2014  . Facial numbness 06/03/2014  . Spasm 04/09/2014  . Amnestic syndrome, drug-induced (HCC) 12/08/2013  . Obstructive apnea 12/08/2013  . Generalized OA 11/19/2013  . Bad memory 11/19/2013  . Foot pain 09/04/2013  . Polypharmacy 08/20/2013  . 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 Abnormal finding on thyroid function test 07/30/2013  . Daytime somnolence 07/30/2013  . Encounter for screening for lipoid disorders 07/30/2013  . OP (osteoporosis) 07/30/2013  . Chronic pain 06/29/2013  . Cannot sleep 06/29/2013  . Dorsopathy 06/29/2013  . Dermatitis, stasis 06/29/2013  . Blue color skin 06/05/2013  . Excessive falling 06/05/2013  . Able to mobilize using mobility aids 06/05/2013  . Curvature of spine 10/10/2012  . Acquired deformities of spine 10/10/2012  . 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 FOM (frequency of micturition) 08/21/2011  . Cervical spondylosis without myelopathy 05/03/2011  . Cervical osteoarthritis 05/03/2011  . DDD (degenerative disc disease), lumbosacral 05/03/2011  . Angulation of spine 05/03/2011  . LBP (low back pain) 05/03/2011  . Degenerative arthritis of lumbar spine 05/03/2011  . Lumbosacral spondylosis 05/03/2011  . Back pain, chronic 03/29/2011  . Benign fibroma of prostate 03/29/2011  . Urgency of micturation 03/29/2011  . Hernia, incisional 08/21/2010  . Exomphalos 08/21/2010  . Arthritis 08/07/2010  . CN (constipation) 08/07/2010  . Exposure to radiation 03/15/1953     Encarnacion ChuAshley Remy Dia PT, DPT 12/05/2015, 3:02 PM  [place] [facility] [address] [place], [place], [address] Phone: [phone]   Fax:  [phone]  Name: Sherre LainJohn David Riccobono MRN: [id number] Date of Birth: 06/16/1935

## 2015-12-07 NOTE — Therapy (Signed)
[place] [facility] [address] [place], [place], [address] Phone: [phone]   Fax:  [phone]  Physical Therapy Treatment  Patient Details  Name: Dustin Turner MRN: [id number] Date of Birth: 05/20/1935 Referring Provider: Orene DesanctisKaren Behling, MD  Encounter Date: 12/05/2015    Past Medical History:  Diagnosis Date  . Arthritis   . BPH (benign prostatic hyperplasia)   . DDD (degenerative disc disease), cervical   . Hypogonadism in male   . Insomnia   . Neurogenic bladder   . OP (osteoporosis)   . Scoliosis   . Spinal stenosis   . Urinary urgency   . Venous stasis     Past Surgical History:  Procedure Laterality Date  . CERVICAL SPINE SURGERY    . HERNIA REPAIR    . JOINT REPLACEMENT    . LUMBAR SPINE SURGERY    . Proleive System Treatment  2006  . ROTATOR CUFF REPAIR    . THORACIC SPINE SURGERY      There were no vitals filed for this visit.                                    PT Long Term Goals - 10/25/15 2319      PT LONG TERM GOAL #1   Title Pt and pt's dtr will report the time to get ready for bed within < 1.5 hrs in order to signify decreased falling asleep.    Time 12   Period Weeks   Status On-going     PT LONG TERM GOAL #2   Title Pt will demo proper technique for kegel with lengthening of pelvic floor mm in order to initiate urine during day/night with less 5-10 min    Time 12   Period Weeks   Status On-going     PT LONG TERM GOAL #3   Title Pt will be able to decrease his use of his urinal in car by 50% of the time in order to travel.    Time 12   Period Weeks   Status On-going     PT LONG TERM GOAL #4   Title Pt will decrease his score on NIH-CPSI from 49% to < 44% in order to demo improved ability to urinate will less difficulty.    Time 12   Period Weeks   Status On-going     PT LONG TERM GOAL #5   Title Pt will demo decreased PSQI from 71% to < 61% in  order to demo improved QOL   Time 12   Period Weeks   Status On-going     PT LONG TERM GOAL #6   Title Patient will score a <16 sec on the TUG to demonstrate significant improvement for decreasing fall risk   Baseline 19.16sec   Time 12   Period Weeks   Status New     PT LONG TERM GOAL #7   Title Patient will score <14sec on the five time sit to stand to demonstrate significant improvement in LE strength and improved ability to transfer from sitting to standing   Baseline 16.6 sec   Time 12   Period Weeks   Status New     PT LONG TERM GOAL #8   Title Patient will score >74102m/s on the 10 m walk test to more safely ambulate across the street   Baseline .58 m/s   Time  12   Period Weeks   Status New             Patient will benefit from skilled therapeutic intervention in order to improve the following deficits and impairments:     Visit Diagnosis: Other lack of coordination  Unsteadiness on feet  History of falling  Other abnormalities of gait and mobility     Problem List Patient Active Problem List   Diagnosis Date Noted  . Urinary urgency 05/27/2015  . Hypogonadism in male 05/27/2015  . Urinary frequency 04/15/2015  . BPH with obstruction/lower urinary tract symptoms 04/15/2015  . Abdominal pain 07/23/2014  . Abnormal gait 07/23/2014  . Absolute anemia 07/23/2014  . Cognitive disorder 07/23/2014  . Narrowing of intervertebral disc space 07/23/2014  . Clinical depression 07/23/2014  . Acid reflux 07/23/2014  . Abdominal hernia 07/23/2014  . HLD (hyperlipidemia) 07/23/2014  . BP (high blood pressure) 07/23/2014  . Amnesia 07/23/2014  . 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 Scoliosis 07/23/2014  . Compressed spine fracture (HCC) 07/23/2014  . Spinal stenosis 07/23/2014  . Facial numbness 06/03/2014  . Spasm 04/09/2014  . Amnestic syndrome, drug-induced (HCC) 12/08/2013  . Obstructive apnea 12/08/2013  . Generalized OA 11/19/2013  . Bad memory  11/19/2013  . Foot pain 09/04/2013  . Polypharmacy 08/20/2013  . 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 Abnormal finding on thyroid function test 07/30/2013  . Daytime somnolence 07/30/2013  . Encounter for screening for lipoid disorders 07/30/2013  . OP (osteoporosis) 07/30/2013  . Chronic pain 06/29/2013  . Cannot sleep 06/29/2013  . Dorsopathy 06/29/2013  . Dermatitis, stasis 06/29/2013  . Blue color skin 06/05/2013  . Excessive falling 06/05/2013  . Able to mobilize using mobility aids 06/05/2013  . Curvature of spine 10/10/2012  . Acquired deformities of spine 10/10/2012  . 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 FOM (frequency of micturition) 08/21/2011  . Cervical spondylosis without myelopathy 05/03/2011  . Cervical osteoarthritis 05/03/2011  . DDD (degenerative disc disease), lumbosacral 05/03/2011  . Angulation of spine 05/03/2011  . LBP (low back pain) 05/03/2011  . Degenerative arthritis of lumbar spine 05/03/2011  . Lumbosacral spondylosis 05/03/2011  . Back pain, chronic 03/29/2011  . Benign fibroma of prostate 03/29/2011  . Urgency of micturation 03/29/2011  . Hernia, incisional 08/21/2010  . Exomphalos 08/21/2010  . Arthritis 08/07/2010  . CN (constipation) 08/07/2010  . Exposure to radiation 03/15/1953    Mariane MastersYeung,Shin Yiing 12/07/2015, 9:53 AM  [place] [facility] [address] [place], [place], [address] Phone: [phone]   Fax:  [phone]  Name: Dustin Turner MRN: [id number] Date of Birth: 07/03/1935

## 2015-12-13 ENCOUNTER — Encounter: Payer: Medicare Other | Admitting: Physical Therapy

## 2015-12-21 ENCOUNTER — Ambulatory Visit: Payer: Medicare Other | Attending: Urology | Admitting: Physical Therapy

## 2015-12-21 DIAGNOSIS — R2681 Unsteadiness on feet: Secondary | ICD-10-CM

## 2015-12-21 DIAGNOSIS — R2689 Other abnormalities of gait and mobility: Secondary | ICD-10-CM | POA: Insufficient documentation

## 2015-12-21 DIAGNOSIS — Z9181 History of falling: Secondary | ICD-10-CM | POA: Diagnosis present

## 2015-12-21 DIAGNOSIS — R278 Other lack of coordination: Secondary | ICD-10-CM | POA: Diagnosis not present

## 2015-12-21 NOTE — Patient Instructions (Signed)
Hold off on standing exercises _______________  To increase the ribcage crease to attempt to minimize muscle spasms: Find a chair and prop pillows to create reclined angle ,.  Place feet further out to lengthen front trunk   ______________  Toning till voice drifts off  Inhale fully like smelling soup  Tone "A", exhale till voice drifts off    Tone E, I , O , U   _______________  3 tai chi moves to a count of 4 to slow down inhale 1-2-pause, exhale 2-1 pause as you lower arms   1. Raise arms  2. Sweeping  3. Looking at the moon   3 reps each   ______________   Donah Driver till voice drifts off  Inhale fully like smelling soup  Tone "A", exhale till voice drifts off    Tone E, I , O , U    _______________ Place a pillow behind head to full relax head against it for relaxation period  7-10 min  Enjoy the silence and vibrations from toning

## 2015-12-21 NOTE — Therapy (Signed)
[place] MAIN [facility] SERVICES [address] [place], [place], [address] Phone: [phone]   Fax:  [phone]  Physical Therapy Treatment / Progress Note  Patient Details  Name: Dustin Turner MRN: [id number] Date of Birth: Feb 22, 1935 Referring Provider: Barbaraann Boys, MD  Encounter Date: 12/21/2015      PT End of Session - 12/21/15 1513    Visit Number 12   Number of Visits    Date for PT Re-Evaluation 11/21/15   Authorization Type 03/07/2022 (G-codes submitted 12/16/2022)   Authorization Time Period 1   PT Start Time 1405   PT Stop Time 1505   PT Time Calculation (min) 60 min   Equipment Utilized During Treatment Gait belt   Activity Tolerance Patient tolerated treatment well;No increased pain   Behavior During Therapy WFL for tasks assessed/performed      Past Medical History:  Diagnosis Date  . Arthritis   . BPH (benign prostatic hyperplasia)   . DDD (degenerative disc disease), cervical   . Hypogonadism in male   . Insomnia   . Neurogenic bladder   . OP (osteoporosis)   . Scoliosis   . Spinal stenosis   . Urinary urgency   . Venous stasis     Past Surgical History:  Procedure Laterality Date  . CERVICAL SPINE SURGERY    . HERNIA REPAIR    . JOINT REPLACEMENT    . LUMBAR SPINE SURGERY    . Proleive System Treatment  2006  . ROTATOR CUFF REPAIR    . THORACIC SPINE SURGERY      There were no vitals filed for this visit.      Subjective Assessment - 12/21/15 1410    Subjective Pt reported he had terrible mm spasms 3 hours after doing the new exercises.  Pt stated he did 10 reps with the exercises (PT prescribed 5 reps). Pt states his mm spasms started 10/ 2015 and they have gotten worse in Oct 2017.  They go on weekly and more weekly since then.  Pt takes a heavy dose of mm relaxor and pain medications to decrease the mm spasms. Pt also gets trigger point injections. Pt still gets the mm spasms after his LBMT works on them.   Pt describes the mm spasms occur at the bottom of the ribcage and feel like "knots" all the around the ribcage from L and R side of the trunk  but more pronounced in the front. The spasms occur on R and L but more recently, more prominent on the Rside.  PT reviewed pt's X Ray report and questioned pt. Pt states his pain stimulator was placed in the R buttock and the leads are located into the thoracic spine.    Update on urinary issues:  pt  gets up 1-4 x/ night instead of 3-6 x / night for urination. Pt reports his urgency has not improved. Pt does not leak before making it to the bathroom. Pt reports he is "calmly" rushing to toilet as recommended. Pt states his R knee is better but pt returned doing tai chi standing one time for 1/3 of the program. Pt sat down but doing the program seated caused mm spasms.  He reported not longer use his power WC.       Pertinent History OCD, Neuropathy, scoliosis, pain pump located at R abdomen, Spinal Cord stimulator, 6 spinal surgeries, L  inguinal hernia 2/2 implantation of the pain pump.  43 years of quitting alcohol., after 20  years of drinking.  Physical activity: resistance bands    Limitations Sitting;Standing;Walking   Patient Stated Goals get up less at night, sleep better,    Pain Onset More than a month ago            [facility] PT Assessment - 12/21/15 2216      Observation/Other Assessments   Observations Seated position: deep fold of skin along sternocostal angle which is the area he reports mm spasms, less skin folds noted when seated in semi fowler position with knees more extended away from chair with feet on ground.  Noted protuding medical device (circular in size the size of hockey puck located below in LQ of R abdomen),  more erect posture since SOC,able to maintain head position with  forward glance, less rounded shoulders     Posture/Postural Control   Posture Comments decreased diaphragmatic expansion w/ inhalation  static balance : no  LOB w/ tucking shirt/ buckling belt                     [facility] Adult PT Treatment/Exercise - 12/21/15 2216      Therapeutic Activites    Therapeutic Activities --  see pt instructions     Neuro Re-ed    Neuro Re-ed Details  see pt instructions: tactile cues for diaphragmatic excursion, toning for elongagted breath for relaxation at ribs, seated position for tai chi, slowed movement and education to not over do the repetitions                 PT Education - 12/21/15 2223    Education provided Yes   Education Details HEP   Person(s) Educated Patient   Methods Explanation;Demonstration;Tactile cues;Verbal cues   Comprehension Verbalized understanding;Returned demonstration             PT Long Term Goals - 12/21/15 2224      PT LONG TERM GOAL #1   Title Pt and pt's dtr will report the time to get ready for bed within < 1.5 hrs in order to signify decreased falling asleep.    Time 12   Period Weeks   Status On-going     PT LONG TERM GOAL #2   Title Pt will demo proper technique for kegel with lengthening of pelvic floor mm in order to initiate urine during day/night with less 5-10 min    Time 12   Period Weeks   Status Achieved     PT LONG TERM GOAL #3   Title Pt will be able to decrease his use of his urinal in car by 50% of the time in order to travel.    Time 12   Period Weeks   Status On-going     PT LONG TERM GOAL #4   Title Pt will decrease his score on NIH-CPSI from 49% to < 44% in order to demo improved ability to urinate will less difficulty.    Time 12   Period Weeks   Status On-going     PT LONG TERM GOAL #5   Title Pt will demo decreased PSQI from 71% to < 61% in order to demo improved QOL   Time 12   Period Weeks   Status On-going     Additional Long Term Goals   Additional Long Term Goals Yes     PT LONG TERM GOAL #6   Title Patient will score a <16 sec on the TUG to demonstrate significant improvement for decreasing fall  risk  Baseline 19.16sec   Time 12   Period Weeks   Status On-going     PT LONG TERM GOAL #7   Title Patient will score <14sec on the five time sit to stand to demonstrate significant improvement in LE strength and improved ability to transfer from sitting to standing   Baseline 16.6 sec   Time 12   Period Weeks   Status On-going     PT LONG TERM GOAL #8   Title Patient will score >93ms on the 10 m walk test to more safely ambulate across the street   Baseline .58 m/s   Time 12   Period Weeks   Status On-going     PT LONG TERM GOAL  #9   TITLE Pt will report decreased mm spasms in the evening after performing HEP across 2 days in order to be IND and compliance with repetitions and customized HEP for maintainence   Time 12   Period Weeks   Status New               Plan - 12/21/15 2227    Clinical Impression Statement [facility]  pt has made progress with his nocturia episodes with decreased trips to the bathroom from 3-6x/ night to 1-4x/ night. Pt has demo'd proper pelvic floor coordination and has shown increased spinal extensor mm strength with more cervical/thoracic extension in seated/ standing position. Pt is progressing well towards his goals and is now working on downregulating his nervous system to address urgency. He is also working to achieve increased spine/ hip extension with  HEP that is customized to address his scoliosis, severely flexed posture. His c/o mm spasms located behind his ribcage is a barrier to his prognosis and ability to perform his HEP. Following today's session, pt reported feeling more relaxed and felt more looseness in the area where his mm spasms typcially occur.  Pt required education on not overdoing his HEP and to slow down his movements with his selected tai chi routine with paced rhythm. Pt also required modification to his seated position to stretch the area when his mm spasms occur at the sternocostal angle.  Withheld standing exercises  until pt reports less mm spasms or demonstrates improved alignment of ribcage over pelvis. Anticipate these modifications will help pt continue progressing towards his goals with less c/o mm spasms. Pt continues to benefit skilled PT.     Rehab Potential Good   Clinical Impairments Affecting Rehab Potential scoliosis, multiple surgeries, psychological factors (anxiety, OCD), sleep disorder   PT Frequency 1x / week   PT Duration 12 weeks   PT Treatment/Interventions ADLs/Self Care Home Management;Aquatic Therapy;Moist Heat;Traction;Gait training;Stair training;Functional mobility training;Neuromuscular re-education;Balance training;Therapeutic activities;Patient/family education;Manual lymph drainage;Scar mobilization;Therapeutic exercise;Cryotherapy;Energy conservation;Other (comment)  relaxation techniques   PT Next Visit Plan Static and dynamic balance interventions, possibility of using platform walker   PT Home Exercise Plan Ambulating with walking sticks    Consulted and Agree with Plan of Care Patient      Patient will benefit from skilled therapeutic intervention in order to improve the following deficits and impairments:  Abnormal gait, Decreased activity tolerance, Decreased coordination, Decreased safety awareness, Decreased strength, Postural dysfunction, Improper body mechanics, Decreased scar mobility, Decreased range of motion, Decreased mobility, Decreased balance, Hypomobility, Pain  Visit Diagnosis: Other lack of coordination  Unsteadiness on feet  History of falling  Other abnormalities of gait and mobility     Problem List Patient Active Problem List   Diagnosis Date Noted  .  Urinary urgency 05/27/2015  . Hypogonadism in male 05/27/2015  . Urinary frequency 04/15/2015  . BPH with obstruction/lower urinary tract symptoms 04/15/2015  . Abdominal pain 07/23/2014  . Abnormal gait 07/23/2014  . Absolute anemia 07/23/2014  . Cognitive disorder 07/23/2014  .  Narrowing of intervertebral disc space 07/23/2014  . Clinical depression 07/23/2014  . Acid reflux 07/23/2014  . Abdominal hernia 07/23/2014  . HLD (hyperlipidemia) 07/23/2014  . BP (high blood pressure) 07/23/2014  . Amnesia 07/23/2014  . 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 Scoliosis 07/23/2014  . Compressed spine fracture ([place]) 07/23/2014  . Spinal stenosis 07/23/2014  . Facial numbness 06/03/2014  . Spasm 04/09/2014  . Amnestic syndrome, drug-induced ([place]) 12/08/2013  . Obstructive apnea 12/08/2013  . Generalized OA 11/19/2013  . Bad memory 11/19/2013  . Foot pain 09/04/2013  . Polypharmacy 08/20/2013  . 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 Abnormal finding on thyroid function test 07/30/2013  . Daytime somnolence 07/30/2013  . Encounter for screening for lipoid disorders 07/30/2013  . OP (osteoporosis) 07/30/2013  . Chronic pain 06/29/2013  . Cannot sleep 06/29/2013  . Dorsopathy 06/29/2013  . Dermatitis, stasis 06/29/2013  . Blue color skin 06/05/2013  . Excessive falling 06/05/2013  . Able to mobilize using mobility aids 06/05/2013  . Curvature of spine 10/10/2012  . Acquired deformities of spine 10/10/2012  . 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 FOM (frequency of micturition) 08/21/2011  . Cervical spondylosis without myelopathy 05/03/2011  . Cervical osteoarthritis 05/03/2011  . DDD (degenerative disc disease), lumbosacral 05/03/2011  . Angulation of spine 05/03/2011  . LBP (low back pain) 05/03/2011  . Degenerative arthritis of lumbar spine 05/03/2011  . Lumbosacral spondylosis 05/03/2011  . Back pain, chronic 03/29/2011  . Benign fibroma of prostate 03/29/2011  . Urgency of micturation 03/29/2011  . Hernia, incisional 08/21/2010  . Exomphalos 08/21/2010  . Arthritis 08/07/2010  . CN (constipation) 08/07/2010  . Exposure to radiation 03/15/1953    Jerl Mina ,PT, DPT, E-RYT  12/21/2015, 10:39 PM  [place] MAIN [facility] SERVICES [address] [place], [place], [address] Phone: [phone]   Fax:  [phone]  Name: Dustin Turner MRN: [id number] Date of Birth: 1935-05-01

## 2015-12-23 ENCOUNTER — Ambulatory Visit: Payer: Self-pay | Admitting: Urology

## 2016-01-04 ENCOUNTER — Ambulatory Visit: Payer: Medicare Other | Admitting: Physical Therapy

## 2016-01-11 ENCOUNTER — Other Ambulatory Visit: Payer: Self-pay | Admitting: Anesthesiology

## 2016-01-11 ENCOUNTER — Ambulatory Visit
Admission: RE | Admit: 2016-01-11 | Discharge: 2016-01-11 | Disposition: A | Discharge status: Home / Self Care | Payer: Medicare Other | Source: Ambulatory Visit | Attending: Anesthesiology | Admitting: Anesthesiology

## 2016-01-11 ENCOUNTER — Ambulatory Visit: Admission: RE | Admit: 2016-01-11 | Payer: Medicare Other | Source: Ambulatory Visit | Admitting: *Deleted

## 2016-01-11 DIAGNOSIS — M545 Low back pain, unspecified: Secondary | ICD-10-CM

## 2016-01-11 DIAGNOSIS — M25551 Pain in right hip: Secondary | ICD-10-CM

## 2016-01-11 DIAGNOSIS — S82002A Unspecified fracture of left patella, initial encounter for closed fracture: Secondary | ICD-10-CM | POA: Insufficient documentation

## 2016-01-11 DIAGNOSIS — X58XXXA Exposure to other specified factors, initial encounter: Secondary | ICD-10-CM | POA: Insufficient documentation

## 2016-01-11 DIAGNOSIS — M898X5 Other specified disorders of bone, thigh: Secondary | ICD-10-CM

## 2016-01-11 DIAGNOSIS — G8929 Other chronic pain: Secondary | ICD-10-CM | POA: Insufficient documentation

## 2016-01-11 DIAGNOSIS — M5136 Other intervertebral disc degeneration, lumbar region: Secondary | ICD-10-CM | POA: Insufficient documentation

## 2016-01-11 DIAGNOSIS — Z96651 Presence of right artificial knee joint: Secondary | ICD-10-CM | POA: Diagnosis not present

## 2016-01-23 ENCOUNTER — Ambulatory Visit: Payer: Medicare Other | Attending: Urology | Admitting: Physical Therapy

## 2016-01-23 DIAGNOSIS — R2681 Unsteadiness on feet: Secondary | ICD-10-CM | POA: Diagnosis present

## 2016-01-23 DIAGNOSIS — R278 Other lack of coordination: Secondary | ICD-10-CM | POA: Insufficient documentation

## 2016-01-23 DIAGNOSIS — R2689 Other abnormalities of gait and mobility: Secondary | ICD-10-CM | POA: Insufficient documentation

## 2016-01-23 DIAGNOSIS — Z9181 History of falling: Secondary | ICD-10-CM | POA: Insufficient documentation

## 2016-01-23 NOTE — Patient Instructions (Signed)
Replace clam shells  With standing hip extensions (toe tap back while both hands are on the counter)  10 reps each leg   Sidestepping along length of counter  Left and right = 1 lap 5 laps   Pelvic floor holds on exhale, count aloud for 3 sec then take 3 breaths to relax pelvic floor. Repeat for 5 reps. Keep count with fingers. Once a day to build pelvic floor endurance.

## 2016-01-24 NOTE — Therapy (Addendum)
[place] MAIN [facility] SERVICES [address] [place], [place], [address] Phone: [phone]   Fax:  [phone]  Physical Therapy Treatment  Patient Details  Name: Dustin Turner MRN: [id number] Date of Birth: 1935/01/19 Referring Provider: Barbaraann Boys, MD  Encounter Date: 01/23/2016      PT End of Session - 01/23/16 1453    Visit Number 13   Date for PT Re-Evaluation 04/16/16   Authorization Type 3/10   PT Start Time 1407   PT Stop Time 1500   PT Time Calculation (min) 53 min   Activity Tolerance Patient tolerated treatment well;No increased pain   Behavior During Therapy WFL for tasks assessed/performed      Past Medical History:  Diagnosis Date  . Arthritis   . BPH (benign prostatic hyperplasia)   . DDD (degenerative disc disease), cervical   . Hypogonadism in male   . Insomnia   . Neurogenic bladder   . OP (osteoporosis)   . Scoliosis   . Spinal stenosis   . Urinary urgency   . Venous stasis     Past Surgical History:  Procedure Laterality Date  . CERVICAL SPINE SURGERY    . HERNIA REPAIR    . JOINT REPLACEMENT    . LUMBAR SPINE SURGERY    . Proleive System Treatment  2006  . ROTATOR CUFF REPAIR    . THORACIC SPINE SURGERY      There were no vitals filed for this visit.      Subjective Assessment - 01/24/16 1809    Subjective (P)  Pt reported he had been sick for 3 weeks. Pt returned doing his exercsies in bed and the seated exercises and did not over do his repetitions. Pt did not have any mm spasms. Within the past month, pt fell 4 times in the middle of the night due to falling asleep when getting on/off and while sitting on the toilet. Since then, he has hired caregivers to care for him at night.  Pt landed 4 x on his R hip bone. X rays showed no Fx.  Pt has been sleeping 6-7 hours instead of 2-3 hrs knowing the caregivers are present. Since the falls, pt 's nocturia episodes has increased from 3x/ night back to  5x / night.  Pt had decreased his nocturia epsiodes by 50% for 2 months.    Pertinent History (P)  OCD, Neuropathy, scoliosis, pain pump located at R abdomen, Spinal Cord stimulator, 6 spinal surgeries, L  inguinal hernia 2/2 implantation of the pain pump.  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  Physical activity: resistance bands    Limitations (P)  Sitting;Standing;Walking   Patient Stated Goals (P)  get up less at night, sleep better,    Pain Onset (P)  More than a month ago                      Pelvic Floor Special Questions - 01/24/16 1833    External Perineal Exam pt consented verbally through clothing   External Palpation 3 sec, 5 reps, with cues for 3 rest breaths           [facility] Adult PT Treatment/Exercise - 01/24/16 1833      Therapeutic Activites    Therapeutic Activities --  pt instructions                PT Education - 01/23/16 1453    Education provided Yes  Education Details HEP   Person(s) Educated Patient   Methods Explanation;Demonstration;Tactile cues;Verbal cues;Handout   Comprehension Returned demonstration;Verbalized understanding             PT Long Term Goals - 01/23/16 1517      PT LONG TERM GOAL #1   Title (P)  Pt and pt's dtr will report increased sleep from 3 hours to 7-8 hrs in order to improve QOL and overall health   Time (P)  12   Period (P)  Weeks   Status (P)  Revised     PT LONG TERM GOAL #2   Title (P)  Pt will demo proper technique for kegel with lengthening of pelvic floor mm in order to initiate urine during day/night with less 5-10 min    Time (P)  12   Period (P)  Weeks   Status (P)  Achieved     PT LONG TERM GOAL #3   Title (P)  Pt will be able to decrease his use of his urinal in car by 50% of the time in order to travel.    Time (P)  12   Period (P)  Weeks   Status (P)  On-going     PT LONG TERM GOAL #4   Title (P)  Pt will decrease his score on NIH-CPSI from 49% to < 44%  in order to demo improved ability to urinate will less difficulty.    Time (P)  12   Period (P)  Weeks   Status (P)  On-going     PT LONG TERM GOAL #5   Title (P)  Pt will demo decreased PSQI from 71% to < 61% in order to demo improved QOL   Time (P)  12   Period (P)  Weeks   Status (P)  On-going     PT LONG TERM GOAL #6   Title (P)  Patient will score a <16 sec on the TUG to demonstrate significant improvement for decreasing fall risk   Baseline (P)  19.16sec   Time (P)  12   Period (P)  Weeks   Status (P)  On-going     PT LONG TERM GOAL #7   Title (P)  Patient will score <14sec on the five time sit to stand to demonstrate significant improvement in LE strength and improved ability to transfer from sitting to standing   Baseline (P)  16.6 sec   Time (P)  12   Period (P)  Weeks   Status (P)  On-going     PT LONG TERM GOAL #8   Title (P)  Patient will score >76ms on the 10 m walk test to more safely ambulate across the street   Baseline (P)  .58 m/s   Time (P)  12   Period (P)  Weeks   Status (P)  On-going     PT LONG TERM GOAL  #9   TITLE (P)  Pt will report decreased mm spasms in the evening after performing HEP across 2 days in order to be IND and compliance with repetitions and customized HEP for maintainence   Time (P)  12   Period (P)  Weeks   Status (P)  On-going               Plan - 01/23/16 1517    Clinical Impression Statement Pt reported he has had no mm spasms when following the repetitions as prescribed by PT with HEP. Pt now has a relapse of nocturia episodes.  Pt showed today ability to progress to endurance pelvic floor strengthening.  Other improvements since last session include his report of increasing sleep from 2-3 hours to 6-7 hours now that he has caregivers present in the middle of the night.  This has put him at ease after sustaining 4 falls in the middle of the night while falling asleep in transferring or sitting on the toilet. No decreased  mobility noted since last session and report of falls. Plan is to continue training pelvic floor to build endurance to minimize nocturia episodes for uninterrupted sleep.  Additonal plan is to help pt return to his  standing tai chi practice which he used to enjoy . Initiated sidestepping exercise with BUE on counter into HEP.  Pt continues to benefit from skilled PT.     Rehab Potential Good   Clinical Impairments Affecting Rehab Potential scoliosis, multiple surgeries, psychological factors (anxiety, OCD), sleep disorder   PT Frequency 1x / week   PT Duration 12 weeks   PT Treatment/Interventions ADLs/Self Care Home Management;Aquatic Therapy;Moist Heat;Traction;Gait training;Stair training;Functional mobility training;Neuromuscular re-education;Balance training;Therapeutic activities;Patient/family education;Manual lymph drainage;Scar mobilization;Therapeutic exercise;Cryotherapy;Energy conservation;Other (comment)  relaxation techniques   PT Next Visit Plan Static and dynamic balance interventions, possibility of using platform walker   PT Home Exercise Plan Ambulating with walking sticks    Consulted and Agree with Plan of Care Patient      Patient will benefit from skilled therapeutic intervention in order to improve the following deficits and impairments:  Abnormal gait, Decreased activity tolerance, Decreased coordination, Decreased safety awareness, Decreased strength, Postural dysfunction, Improper body mechanics, Decreased scar mobility, Decreased range of motion, Decreased mobility, Decreased balance, Hypomobility, Pain  Visit Diagnosis: Other lack of coordination  Unsteadiness on feet  History of falling  Other abnormalities of gait and mobility     Problem List Patient Active Problem List   Diagnosis Date Noted  . Urinary urgency 05/27/2015  . Hypogonadism in male 05/27/2015  . Urinary frequency 04/15/2015  . BPH with obstruction/lower urinary tract symptoms 04/15/2015   . Abdominal pain 07/23/2014  . Abnormal gait 07/23/2014  . Absolute anemia 07/23/2014  . Cognitive disorder 07/23/2014  . Narrowing of intervertebral disc space 07/23/2014  . Clinical depression 07/23/2014  . Acid reflux 07/23/2014  . Abdominal hernia 07/23/2014  . HLD (hyperlipidemia) 07/23/2014  . BP (high blood pressure) 07/23/2014  . Amnesia 07/23/2014  . 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 Scoliosis 07/23/2014  . Compressed spine fracture ([place]) 07/23/2014  . Spinal stenosis 07/23/2014  . Facial numbness 06/03/2014  . Spasm 04/09/2014  . Amnestic syndrome, drug-induced ([place]) 12/08/2013  . Obstructive apnea 12/08/2013  . Generalized OA 11/19/2013  . Bad memory 11/19/2013  . Foot pain 09/04/2013  . Polypharmacy 08/20/2013  . 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 Abnormal finding on thyroid function test 07/30/2013  . Daytime somnolence 07/30/2013  . Encounter for screening for lipoid disorders 07/30/2013  . OP (osteoporosis) 07/30/2013  . Chronic pain 06/29/2013  . Cannot sleep 06/29/2013  . Dorsopathy 06/29/2013  . Dermatitis, stasis 06/29/2013  . Blue color skin 06/05/2013  . Excessive falling 06/05/2013  . Able to mobilize using mobility aids 06/05/2013  . Curvature of spine 10/10/2012  . Acquired deformities of spine 10/10/2012  . 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 FOM (frequency of micturition) 08/21/2011  . Cervical spondylosis without myelopathy 05/03/2011  . Cervical osteoarthritis 05/03/2011  . DDD (degenerative disc disease), lumbosacral 05/03/2011  . Angulation of spine 05/03/2011  .  LBP (low back pain) 05/03/2011  . Degenerative arthritis of lumbar spine 05/03/2011  . Lumbosacral spondylosis 05/03/2011  . Back pain, chronic 03/29/2011  . Benign fibroma of prostate 03/29/2011  . Urgency of micturation 03/29/2011  . Hernia, incisional 08/21/2010  . Exomphalos 08/21/2010  . Arthritis 08/07/2010  . CN (constipation)  08/07/2010  . Exposure to radiation 03/15/1953    Jerl Mina ,PT, DPT, E-RYT  01/24/2016, 6:41 PM  [place] MAIN [facility] SERVICES [address] [place], [place], [address] Phone: [phone]   Fax:  [phone]  Name: Kalob Bergen MRN: [id number] Date of Birth: 09-23-1935

## 2016-01-30 ENCOUNTER — Encounter: Payer: Self-pay | Admitting: Urology

## 2016-01-30 ENCOUNTER — Ambulatory Visit (INDEPENDENT_AMBULATORY_CARE_PROVIDER_SITE_OTHER): Payer: Medicare Other | Admitting: Urology

## 2016-01-30 VITALS — BP 162/77 | HR 99 | Ht 67.0 in | Wt 158.0 lb

## 2016-01-30 DIAGNOSIS — R351 Nocturia: Secondary | ICD-10-CM | POA: Diagnosis not present

## 2016-01-30 NOTE — Progress Notes (Signed)
01/30/2016 2:31 PM   Dustin Turner 1935/05/01 [id number]  Referring provider: Barbaraann Boys, MD [place], [place] [address]  Chief Complaint  Patient presents with  . Urinary Incontinence    discuss options    HPI: The patient is a 81 year old gentleman treated by Dr. Elnoria Howard for nighttime frequency. Physical therapy reduced his nighttime frequency from 3-6 to 1-4. He recently regressed back to 5 times. He wears ankle hose to reduce ankle edema. He has tried [employer] and perhaps other medication. He takes the beta 3 agonists 50 mg every second day. There was question whether or not it would cause retention when he was taking it every day. He had a thermotherapy prostate treatment in 2008. His flow was good  He has had 6 back operations. He wears a brace for scoliosis.  He is continent and denies previous GU surgery.  Modifying factors: There are no other modifying factors  Associated signs and symptoms: There are no other associated signs and symptoms Aggravating and relieving factors: There are no other aggravating or relieving factors Severity: Moderate Duration: Persistent   PMH: Past Medical History:  Diagnosis Date  . Arthritis   . BPH (benign prostatic hyperplasia)   . DDD (degenerative disc disease), cervical   . Hypogonadism in male   . Insomnia   . Neurogenic bladder   . OP (osteoporosis)   . Scoliosis   . Spinal stenosis   . Urinary urgency   . Venous stasis     Surgical History: Past Surgical History:  Procedure Laterality Date  . CERVICAL SPINE SURGERY    . HERNIA REPAIR    . JOINT REPLACEMENT    . LUMBAR SPINE SURGERY    . Proleive System Treatment  2006  . ROTATOR CUFF REPAIR    . THORACIC SPINE SURGERY      Home Medications:  Allergies as of 01/30/2016      Reactions   Amitriptyline    Nortriptyline Other (See Comments)   GI  upset      Medication List       Accurate as of 01/30/16  2:31 PM. Always use your  most recent med list.          alendronate 70 MG tablet Commonly known as:  FOSAMAX   ammonium lactate 12 % lotion Commonly known as:  LAC-HYDRIN Apply topically.   ARICEPT 10 MG tablet Generic drug:  donepezil 10 mg.   CALCIUM 500/D 500-200 MG-UNIT tablet Generic drug:  calcium-vitamin D Take by mouth.   ciprofloxacin 0.3 % ophthalmic solution Commonly known as:  CILOXAN Reported on 05/10/2015   D 400 400 units Chew Generic drug:  Cholecalciferol Chew by mouth. Reported on 04/11/2015   DULoxetine 30 MG capsule Commonly known as:  CYMBALTA Take 30 mg by mouth daily.   erythromycin ophthalmic ointment Reported on 05/10/2015   Eszopiclone 3 MG Tabs Reported on 04/11/2015   fentaNYL 800 MCG lollipop Commonly known as:  ACTIQ Reported on 04/11/2015   FISH OIL + D3 1000-1000 MG-UNIT Caps Take by mouth.   gabapentin 400 MG capsule Commonly known as:  NEURONTIN Take 400 mg by mouth.   HYDROmorphone (PF) 250 MG injection Commonly known as:  DILAUDID HP Inject 10 mg as directed continuously.   ibuprofen 800 MG tablet Commonly known as:  ADVIL,MOTRIN Take 800 mg by mouth.   LINZESS 290 MCG Caps capsule Generic drug:  linaclotide   LORZONE 750 MG Tabs Generic drug:  Chlorzoxazone  Take by mouth. Reported on 04/11/2015   magnesium hydroxide 400 MG/5ML suspension Commonly known as:  MILK OF MAGNESIA Take by mouth. Reported on 04/11/2015   memantine 10 MG tablet Commonly known as:  NAMENDA Take by mouth.   METANX 3-90.314-2-35 MG Caps Reported on 04/11/2015   methylphenidate 10 MG tablet Commonly known as:  RITALIN Take by mouth.   mexiletine 200 MG capsule Commonly known as:  MEXITIL Reported on 04/11/2015   mirabegron ER 50 MG Tb24 tablet Commonly known as:  MYRBETRIQ Take 1 tablet (50 mg total) by mouth daily.   MOVANTIK 12.5 MG Tabs tablet Generic drug:  naloxegol oxalate   NAPROXEN DR 500 MG EC tablet Generic drug:  naproxen Take by mouth.     NARCAN 4 MG/0.1ML Liqd nasal spray kit Generic drug:  naloxone Reported on 04/11/2015   oxyCODONE 40 mg 12 hr tablet Commonly known as:  OXYCONTIN Take by mouth. Reported on 04/11/2015   PEN NEEDLES 31GX5/16" 31G X 8 MM Misc 1 each by Does not apply route.   RESTASIS 0.05 % ophthalmic emulsion Generic drug:  cycloSPORINE   SUPER BIOTIN 5 MG Tabs Generic drug:  Biotin 10,000 mg.   Testosterone 20.25 MG/ACT (1.62%) Gel Place onto the skin.   triamcinolone cream 0.1 % Commonly known as:  KENALOG apply to affected area twice a day UNTIL IMPROVED   vitamin B-12 1000 MCG tablet Commonly known as:  CYANOCOBALAMIN Take by mouth.   vitamin C 1000 MG tablet Take 460 mg by mouth.   VOLTAREN 1 % Gel Generic drug:  diclofenac sodium Apply topically.       Allergies:  Allergies  Allergen Reactions  . Amitriptyline   . Nortriptyline Other (See Comments)    GI  upset    Family History: Family History  Problem Relation Age of Onset  . Diabetes Father   . Stroke Father   . Alcohol abuse Father   . Diabetes Brother   . Stroke Mother   . Alcohol abuse Brother   . Kidney disease Neg Hx   . Prostate cancer Neg Hx     Social History:  reports that he has never smoked. He has never used smokeless tobacco. He reports that he does not drink alcohol or use drugs.  ROS:                                        Physical Exam: BP (!) 162/77   Pulse 99   Ht 5' 7"  (1.702 m)   Wt 158 lb (71.7 kg)   BMI 24.75 kg/m   Constitutional:  Alert and oriented, No acute distress. HEENT: [place] AT, moist mucus membranes.  Trachea midline, no masses. Cardiovascular: No clubbing, cyanosis, or edema. Respiratory: Normal respiratory effort, no increased work of breathing. GI: Abdomen is soft, nontender, nondistended, no abdominal masses GU: Small benign prostate Skin: No rashes, bruises or suspicious lesions. Lymph: No cervical or inguinal adenopathy. Neurologic:  Grossly intact, no focal deficits, moving all 4 extremities. Psychiatric: Normal mood and affect.  Laboratory Data: Lab Results  Component Value Date   WBC 5.1 05/22/2015   HGB 14.8 05/22/2015   HCT 44.1 05/22/2015   MCV 95.2 05/22/2015   PLT 116 (L) 05/22/2015    Lab Results  Component Value Date   CREATININE 0.96 05/22/2015    No results found for: PSA  Lab Results  Component  Value Date   TESTOSTERONE 436 06/27/2015    No results found for: HGBA1C  Urinalysis    Component Value Date/Time   APPEARANCEUR Cloudy (A) 04/11/2015 1455   GLUCOSEU Negative 04/11/2015 1455   BILIRUBINUR Negative 04/11/2015 1455   PROTEINUR Negative 04/11/2015 1455   NITRITE Negative 04/11/2015 1455   LEUKOCYTESUR Negative 04/11/2015 1455    Pertinent Imaging: none  Assessment & Plan:  The patient has minimal frequency and can void with a good flow every 3-4 hours. He has significant nighttime frequency. He has exhausted medication. The future role of DDAVP and current role of percutaneous tibial nerve stimulation was discussed. He was very well educated around the topic matter and I explained to him that I felt the Botox InterStim were not good options currently. He would like to try to beta 3 agonist daily and be reassessed in one month. The pathophysiology was discussed. He was interested in percutaneous tibial nerve stimulation  1. Nighttime frequency 2. Benign prosthetic hyperplasia with lower urinary tract symptoms  There are no diagnoses linked to this encounter.  No Follow-up on file.  Reece Packer, MD  [facility] [address] [place], [place] [address] [phone]

## 2016-02-06 ENCOUNTER — Ambulatory Visit: Payer: Medicare Other | Admitting: Physical Therapy

## 2016-02-06 DIAGNOSIS — R2689 Other abnormalities of gait and mobility: Secondary | ICD-10-CM

## 2016-02-06 DIAGNOSIS — Z9181 History of falling: Secondary | ICD-10-CM

## 2016-02-06 DIAGNOSIS — R278 Other lack of coordination: Secondary | ICD-10-CM

## 2016-02-06 DIAGNOSIS — R2681 Unsteadiness on feet: Secondary | ICD-10-CM

## 2016-02-06 NOTE — Therapy (Addendum)
[place] MAIN [facility] SERVICES [address] [place], [place], [address] Phone: [phone]   Fax:  [phone]  Physical Therapy Treatment  Patient Details  Name: Dustin Turner MRN: [id number] Date of Birth: [birth year] Referring Provider: Barbaraann Boys, MD  Encounter Date: 02/06/2016      PT End of Session - 02/06/16 2038    Visit Number 14   Date for PT Re-Evaluation 04/16/16   Authorization Type 4/10   PT Start Time 9485   PT Stop Time 1510   PT Time Calculation (min) 53 min   Activity Tolerance Patient tolerated treatment well;No increased pain   Behavior During Therapy WFL for tasks assessed/performed      Past Medical History:  Diagnosis Date  . Arthritis   . BPH (benign prostatic hyperplasia)   . DDD (degenerative disc disease), cervical   . Hypogonadism in male   . Insomnia   . Neurogenic bladder   . OP (osteoporosis)   . Scoliosis   . Spinal stenosis   . Urinary urgency   . Venous stasis     Past Surgical History:  Procedure Laterality Date  . CERVICAL SPINE SURGERY    . HERNIA REPAIR    . JOINT REPLACEMENT    . LUMBAR SPINE SURGERY    . Proleive System Treatment  2006  . ROTATOR CUFF REPAIR    . THORACIC SPINE SURGERY      There were no vitals filed for this visit.      Subjective Assessment - 02/06/16 1424    Subjective Pt reported has been able to perform his exercises 2-3 x once a week for the past 2 weeks. Pt has been performing  4  tai chi exercises standing for 4 reps, twice across the past 2 weeks.   Pt reported his mm spasms under his ribcage felt like they were begining to start 3 hrs after he performed exercises.  Pt has been able to stop mm spasms from becoming full-blown with two mm relaxant medications. Pt states the mm spasms have improved by 50%.  Pt has been able to continue with the exercises after 5-6 days, whereas in the past, the mm spasms kept pt from performing his exercises.  Pt no longer  has accidents with urgency.  However, pt 's nocturia has increased back to 5-6 x  / night instead of 1-4 x/ night. Pt has not performed the long contractions with pelvic floor training.  Pt reported his knee is 40% better but the radiofrequency ablation did not relieve his pain.  Pt did not have R knee pain when performing the standing exercises but did have knee pain that night. R knee feels like " bee-stinging".       Pertinent History OCD, Neuropathy, scoliosis, pain pump located at R abdomen, Spinal Cord stimulator, 6 spinal surgeries, L  inguinal hernia 2/2 implantation of the pain pump.  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  Physical activity: resistance bands    Limitations Sitting;Standing;Walking   Patient Stated Goals get up less at night, sleep better,    Pain Onset More than a month ago            [facility] PT Assessment - 02/06/16 1624      Observation/Other Assessments   Observations --  brighter affect, more alert, healthier complexion,improved      Coordination   Gross Motor Movements are Fluid and Coordinated --  moderate cues to decrease abdominal overuse  w/ contraction                  Pelvic Floor Special Questions - 02/06/16 1435    External Perineal Exam pt consented verbally through clothing   External Palpation 3 sec, 5 reps, with cues for 3 rest breaths  required visual images to understand endurance training           [place] [facility] PT Treatment/Exercise - 02/06/16 1622      Therapeutic Activites    Therapeutic Activities --  see pt instructions     Neuro Re-ed    Neuro Re-ed Details  see pt instructions                     PT Long Term Goals - 02/06/16 2106      PT LONG TERM GOAL #1   Title Pt will demo/ report IND with performing his tai chi practice without mm spasms nor loss of balance in order to return to his hobby and engage in a relaxing activity for overall health and pelvic health wellness    Time  12   Period Weeks   Status Revised     PT LONG TERM GOAL #2   Title Pt will demo proper technique for kegel with lengthening of pelvic floor mm in order to initiate urine during day/night with less 5-10 min    Time 12   Period Weeks   Status Achieved     PT LONG TERM GOAL #3   Title Pt will be able to decrease his use of his urinal in car by 50% of the time in order to travel.    Time 12   Period Weeks   Status Achieved     PT LONG TERM GOAL #4   Title Pt will decrease his score on NIH-CPSI from 49% to < 44% in order to demo improved ability to urinate will less difficulty.    Time 12   Period Weeks   Status On-going     PT LONG TERM GOAL #5   Title Pt will demo decreased PSQI from 71% to < 61% in order to demo improved QOL   Time 12   Period Weeks   Status On-going     Additional Long Term Goals   Additional Long Term Goals Yes     PT LONG TERM GOAL #6   Title Patient will score a <16 sec on the TUG to demonstrate significant improvement for decreasing fall risk   Baseline 19.16sec   Time 12   Period Weeks   Status On-going     PT LONG TERM GOAL #7   Title Patient will score <14sec on the five time sit to stand to demonstrate significant improvement in LE strength and improved ability to transfer from sitting to standing   Baseline 16.6 sec   Time 12   Period Weeks   Status On-going     PT LONG TERM GOAL #8   Title Patient will score >90ms on the 10 m walk test to more safely ambulate across the street   Baseline .58 m/s   Time 12   Period Weeks   Status On-going     PT LONG TERM GOAL  #9   TITLE Pt will report decreased mm spasms in the evening after performing HEP across 2 days in order to be IND and compliance with repetitions and customized HEP for maintainence   Time 12   Period Weeks   Status  Achieved     PT LONG TERM GOAL  #10   TITLE Pt will report decreased nocturia from 5-6 x/ night to 1-4x/ night across 2 weeks in order to improve sleep    Time  12   Period Weeks   Status New               Plan - 02/06/16 2051    Clinical Impression Statement Pt is making great progress as his barriers to rehab (mm spasms under ribcage and interrupted sleep) have been better managed across the past two visits.  Pt reported he has been able to perform his HEP with ability to minimize mm spasms that occur near his anterior ribcage.  Suspect these mm spasms are associated with pt's severe thoracic/lumbar flexion 2/2 severe scoliosis and multiple back/neck surgeries and having a pain pump implant located in the R quadrant of abdomen. He is managing them better by not overdoing his exercises as recommended by PT and with aid of mm relaxants. Downgrading pt from standing exercises (lumbar/trunk extension) to sidestepping L/R along a kitchen counter with BUE support also helped to decrease mm spasms.  Pt's complexion and alertness indicate improved sleep and pt also reports increased hours of sleep now with caregivers present at his home to ensure safety and decreased risk of falls. He had suffered multiple falls in the past due to falling asleep when getting to the toilet or sitting on the toilet in the middle of the night.   In terms of his urinary issues, pt's urge has improved and is no longer an issue in the daytime. A month ago, pt's nocturia frequency had improved 1-4x/night. However, pt's nocturia episodes have relapsed to his original level of dysfunction (5-6 x/ night) after getting sick.  Pt required  further explanation of endurance pelvic floor training today because pt had not performed the endurance exercises he was taught at last session. He demo'd technique correctly and voiced understanding. Anticipate endurance training will help pt decrease his nocturia episodes. Also suspect the following factors will further help to downregulate pt's upregulated system which, in turn, will continue to help improve his nocturia episodes: 1) regular massages  with [facility] whom PT communicates with re: POC 2) instructions to slow down his tai chi movements, perform less reps, and to perform them in a more reclined position to decrease trunk-hip flexion angle mm spasms. 3) helping pt achieve his goal to return to his tai chi practice with a referral to a PT for 2 visits who has expertise with teaching tai chi. Pt needs a modified tai chi practice to ensure safety and to accommodate for pt's severe scoliosis, R knee pain, and balance deficits.  Pt 's  R knee pain  has been a barrier to his goal to return to standing tai chi practice. Pt reports increased ability to perform standing exercises the past 2 weeks with minimal R knee pain during the exercise but knee pain returns after the exercises are performed. His recent radiofrequency ablation Tx has not given him any relief.  Pt will return to pelvic health PT in 4 weeks in order to continue addressing his goals related to his urinary goals. Pt continues to benefit from skilled PT.    Rehab Potential Good   Clinical Impairments Affecting Rehab Potential scoliosis, multiple surgeries, psychological factors (anxiety, OCD), sleep disorder   PT Frequency 1x / week   PT Duration 12 weeks   PT Treatment/Interventions ADLs/Self Care Home Management;Aquatic Therapy;Moist Heat;Traction;Gait training;Stair  training;Functional mobility training;Neuromuscular re-education;Balance training;Therapeutic activities;Patient/family education;Manual lymph drainage;Scar mobilization;Therapeutic exercise;Cryotherapy;Energy conservation;Other (comment)  relaxation techniques   PT Next Visit Plan Static and dynamic balance interventions, possibility of using platform walker   PT Home Exercise Plan Ambulating with walking sticks    Consulted and Agree with Plan of Care Patient      Patient will benefit from skilled therapeutic intervention in order to improve the following deficits and impairments:  Abnormal gait, Decreased activity  tolerance, Decreased coordination, Decreased safety awareness, Decreased strength, Postural dysfunction, Improper body mechanics, Decreased scar mobility, Decreased range of motion, Decreased mobility, Decreased balance, Hypomobility, Pain  Visit Diagnosis: Other lack of coordination  Unsteadiness on feet  History of falling  Other abnormalities of gait and mobility     Problem List Patient Active Problem List   Diagnosis Date Noted  . Urinary urgency 05/27/2015  . Hypogonadism in male 05/27/2015  . Urinary frequency 04/15/2015  . BPH with obstruction/lower urinary tract symptoms 04/15/2015  . Abdominal pain 07/23/2014  . Abnormal gait 07/23/2014  . Absolute anemia 07/23/2014  . Cognitive disorder 07/23/2014  . Narrowing of intervertebral disc space 07/23/2014  . Clinical depression 07/23/2014  . Acid reflux 07/23/2014  . Abdominal hernia 07/23/2014  . HLD (hyperlipidemia) 07/23/2014  . BP (high blood pressure) 07/23/2014  . Amnesia 07/23/2014  . 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 Scoliosis 07/23/2014  . Compressed spine fracture ([place]) 07/23/2014  . Spinal stenosis 07/23/2014  . Facial numbness 06/03/2014  . Spasm 04/09/2014  . Amnestic syndrome, drug-induced ([place]) 12/08/2013  . Obstructive apnea 12/08/2013  . Generalized OA 11/19/2013  . Bad memory 11/19/2013  . Foot pain 09/04/2013  . Polypharmacy 08/20/2013  . 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 Abnormal finding on thyroid function test 07/30/2013  . Daytime somnolence 07/30/2013  . Encounter for screening for lipoid disorders 07/30/2013  . OP (osteoporosis) 07/30/2013  . Chronic pain 06/29/2013  . Cannot sleep 06/29/2013  . Dorsopathy 06/29/2013  . Dermatitis, stasis 06/29/2013  . Blue color skin 06/05/2013  . Excessive falling 06/05/2013  . Able to mobilize using mobility aids 06/05/2013  . Curvature of spine 10/10/2012  . Acquired deformities of spine 10/10/2012  . Failed back syndrome  of lumbar spine 05/05/2012  . FOM (frequency of micturition) 08/21/2011  . Cervical spondylosis without myelopathy 05/03/2011  . Cervical osteoarthritis 05/03/2011  . DDD (degenerative disc disease), lumbosacral 05/03/2011  . Angulation of spine 05/03/2011  . LBP (low back pain) 05/03/2011  . Degenerative arthritis of lumbar spine 05/03/2011  . Lumbosacral spondylosis 05/03/2011  . Back pain, chronic 03/29/2011  . Benign fibroma of prostate 03/29/2011  . Urgency of micturation 03/29/2011  . Hernia, incisional 08/21/2010  . Exomphalos 08/21/2010  . Arthritis 08/07/2010  . CN (constipation) 08/07/2010  . Exposure to radiation 03/15/1953    Jerl Mina ,PT, DPT, E-RYT   02/06/2016, 9:13 PM  [place] MAIN [facility] SERVICES [address] [place], [place], [address] Phone: [phone]   Fax:  [phone]  Name: Dustin Turner MRN: [id number] Date of Birth: [birth year]

## 2016-02-06 NOTE — Patient Instructions (Addendum)
PELVIC FLOOR / KEGEL EXERCISES    Pelvic floor/ Kegel exercises are used to strengthen the muscles in the base of your pelvis that are responsible for supporting your pelvic organs and preventing urine/feces leakage. Based on your therapist's recommendations, they can be performed while standing, sitting, or lying down.  Make yourself aware of this muscle group spanning in a diamond shaped area. Top of the diamond is the pubic bone, bottom of the diamond is the tailbone, and edges of the diamond is where the sitting bones are located. Use these cues to notice your ability to lengthen and lower them towards feet with inhalation (pelvic floor lowers like a hammock)  and to lift them upward with exhalation (pelvic floor domes up like an umbrella) :   Dustin Turner. Inhale, do nothing but feel the belly expand and pelvic floor lowering/expanding like a hammock. Exhale, feel pelvic floor lifting upwards with the scrotum as if you are walking into a cold lake.   . Inhale, do nothing, relax belly and pelvic floor. Then exhale, pull up the center of the diamond shaped area of your pelvic floor muscles inside your body while your belly/back muscles wrap tighter or "hug" around you.  [name] Kitchen. Another way to practice is by place your hand on top of your pubic bone. Tighten and draw in the muscles around the anal and uretha openings. You will feel the muscles lift towards your pubic bone and squeeze the openings shut. If you are squeezing your buttock muscles too, you will need to decrease your amount of effort by 50% to not involve the buttocks.   Common Errors: . Breath holding: If you are holding your breath, you may be bearing down against your bladder instead of pulling it up. If you belly bulges up while you are squeezing, you are holding your breath. Be sure to breathe gently in and out while exercising. Counting out loud may help you avoid holding your breath. . Accessory muscle use: You should not see or feel other muscle  movement when performing pelvic floor exercises. When done properly, no one can tell that you are performing the exercises. Keep the buttocks, belly and inner thighs relaxed. . Overdoing it: Your muscles can fatigue and stop working for you if you over-exercise. You may actually leak more or feel soreness at the lower abdomen or rectum.  YOUR HOME EXERCISE PROGRAM  .   LONG HOLDS TO BUILD ENDURANCE: Position: sidelying or seated  . inhale, exhale then squeeze the muscle and then count aloud for 3 seconds. Rest (3 long breaths)  . [facility]form [facility]  . MORNING, LUNCHTIME, NIGHT  Times/day  ** REMEMBER TO NOT OVER TIGHTEN, THE ABDOMINAL MUSCLES SHOULD NOT MOVE VERY MUCH.  Less is more.   ________ Light gentle low ABDOMINAL MASSAGE in bed / after your exercises :  Middle to belly button,  3 strokes Left side  to belly button  3 strokes Right side to belly button  3 stroke

## 2016-02-20 ENCOUNTER — Ambulatory Visit: Payer: Medicare Other | Attending: Urology | Admitting: Physical Therapy

## 2016-02-20 DIAGNOSIS — R278 Other lack of coordination: Secondary | ICD-10-CM | POA: Diagnosis present

## 2016-02-20 DIAGNOSIS — Z9181 History of falling: Secondary | ICD-10-CM | POA: Diagnosis present

## 2016-02-20 DIAGNOSIS — R2689 Other abnormalities of gait and mobility: Secondary | ICD-10-CM | POA: Insufficient documentation

## 2016-02-20 DIAGNOSIS — R2681 Unsteadiness on feet: Secondary | ICD-10-CM | POA: Diagnosis present

## 2016-02-20 NOTE — Therapy (Signed)
[facility] [address] [place], [address] Phone: [phone]   Fax:  [phone]  Physical Therapy Treatment  Patient Details  Name: Dustin Turner MRN: [id number] Date of Birth: 09-04-1935 Referring Provider: Barbaraann Boys, MD  Encounter Date: 02/20/2016      PT End of Session - 02/20/16 1451    Visit Number 15   Date for PT Re-Evaluation 04/16/16   Authorization Type 5/10   PT Start Time 4742   PT Stop Time 1450   PT Time Calculation (min) 46 min   Activity Tolerance Patient tolerated treatment well;No increased pain   Behavior During Therapy WFL for tasks assessed/performed      Past Medical History:  Diagnosis Date  . Arthritis   . BPH (benign prostatic hyperplasia)   . DDD (degenerative disc disease), cervical   . Hypogonadism in male   . Insomnia   . Neurogenic bladder   . OP (osteoporosis)   . Scoliosis   . Spinal stenosis   . Urinary urgency   . Venous stasis     Past Surgical History:  Procedure Laterality Date  . CERVICAL SPINE SURGERY    . HERNIA REPAIR    . JOINT REPLACEMENT    . LUMBAR SPINE SURGERY    . Proleive System Treatment  2006  . ROTATOR CUFF REPAIR    . THORACIC SPINE SURGERY      There were no vitals filed for this visit.      Subjective Assessment - 02/20/16 1408    Subjective Pt reports experiencing frequent muscle spasms, especially with spinal extension. Pt states that most movements except tai chi seem to provoke spasms. Pt reports tai chi usually helps him and he is eager to return to a regular practice.  Pt reports decreased sensation in his feet.    Pertinent History OCD, Neuropathy, scoliosis, pain pump located at R abdomen, Spinal Cord stimulator, 6 spinal surgeries, L  inguinal hernia 2/2 implantation of the pain pump.  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  Physical activity: resistance bands    Limitations  Sitting;Standing;Walking   Patient Stated Goals get up less at night, sleep better,    Currently in Pain? Yes   Pain Score 7    Pain Location Knee   Pain Orientation Right   Pain Descriptors / Indicators Constant   Pain Type Chronic pain   Pain Onset More than a month ago   Pain Frequency Constant       Objective:  Tai Chi Exercises  [employer], to increase upright posture and increase breathing capacity, pt cued on proper breathing pattern. Pt standing, inhale to raise arms above head (abduction), and exhale to lower arms to side (adduction).   Picking the [employer], to increase upright posture and increase spine ROM, pt frequently cued on proper hand position. Pt standing, leading with the elbow and palm facing back, raise arm above head with the inhale, exhale and turn palm back as the arm lowers. Repeat on other side.  Opening the Window, to increase upright posture and increase breathing capacity, pt able to perform with minimal cues. Pt standing, raise both arms over head (flexion) with the inhale, then exhale and lower arms down.   Holding the [place], to increase spine rotation, pt performed in seated as balance is not yet adequate to perform safely in standing. Pt seated, arms forward as if holding a ball, inhale to rotate  to one side, exhale and return to neutral. Repeat on other side.   Emphasized performing exercises with quality form, in a slow and controlled manner. Emphasized importance of coordinating movements with the breath and pausing in between movements. Educated patient on taking breaks as necessary.                          PT Education - 02/20/16 1450    Education provided Yes   Education Details HEP (tai chi exercises)   Person(s) Educated Patient   Methods Explanation;Demonstration;Tactile cues;Verbal cues   Comprehension Verbalized understanding;Returned demonstration;Verbal cues required             PT Long Term Goals -  02/06/16 2106      PT LONG TERM GOAL #1   Title Pt will demo/ report IND with performing his tai chi practice without mm spasms nor loss of balance in order to return to his hobby and engage in a relaxing activity for overall health and pelvic health wellness    Time 12   Period Weeks   Status Revised     PT LONG TERM GOAL #2   Title Pt will demo proper technique for kegel with lengthening of pelvic floor mm in order to initiate urine during day/night with less 5-10 min    Time 12   Period Weeks   Status Achieved     PT LONG TERM GOAL #3   Title Pt will be able to decrease his use of his urinal in car by 50% of the time in order to travel.    Time 12   Period Weeks   Status Achieved     PT LONG TERM GOAL #4   Title Pt will decrease his score on NIH-CPSI from 49% to < 44% in order to demo improved ability to urinate will less difficulty.    Time 12   Period Weeks   Status On-going     PT LONG TERM GOAL #5   Title Pt will demo decreased PSQI from 71% to < 61% in order to demo improved QOL   Time 12   Period Weeks   Status On-going     Additional Long Term Goals   Additional Long Term Goals Yes     PT LONG TERM GOAL #6   Title Patient will score a <16 sec on the TUG to demonstrate significant improvement for decreasing fall risk   Baseline 19.16sec   Time 12   Period Weeks   Status On-going     PT LONG TERM GOAL #7   Title Patient will score <14sec on the five time sit to stand to demonstrate significant improvement in LE strength and improved ability to transfer from sitting to standing   Baseline 16.6 sec   Time 12   Period Weeks   Status On-going     PT LONG TERM GOAL #8   Title Patient will score >46ms on the 10 m walk test to more safely ambulate across the street   Baseline .58 m/s   Time 12   Period Weeks   Status On-going     PT LONG TERM GOAL  #9   TITLE Pt will report decreased mm spasms in the evening after performing HEP across 2 days in order to be  IND and compliance with repetitions and customized HEP for maintainence   Time 12   Period Weeks   Status Achieved     PT LONG  TERM GOAL  #10   TITLE Pt will report decreased nocturia from 5-6 x/ night to 1-4x/ night across 2 weeks in order to improve sleep    Time 12   Period Weeks   Status New               Plan - 02/20/16 1453    Clinical Impression Statement Pt presents with significant thoracic kyphosis, and decreased cervical, thoracic, lumbar ROM and decreased balance. Pt demonstrated adequate control with pre-tai chi exercises but required extensive verbal cuing for proper form and breathing pattern. At next visit review tai chi exercises and progress as tolerated.    Rehab Potential Good   Clinical Impairments Affecting Rehab Potential scoliosis, multiple surgeries, psychological factors (anxiety, OCD), sleep disorder   PT Frequency 1x / week   PT Duration 12 weeks   PT Treatment/Interventions ADLs/Self Care Home Management;Aquatic Therapy;Moist Heat;Traction;Gait training;Stair training;Functional mobility training;Neuromuscular re-education;Balance training;Therapeutic activities;Patient/family education;Manual lymph drainage;Scar mobilization;Therapeutic exercise;Cryotherapy;Energy conservation;Other (comment)  relaxation techniques   PT Next Visit Plan Static and dynamic balance interventions, possibility of using platform walker   PT Home Exercise Plan Ambulating with walking sticks    Consulted and Agree with Plan of Care Patient      Patient will benefit from skilled therapeutic intervention in order to improve the following deficits and impairments:  Abnormal gait, Decreased activity tolerance, Decreased coordination, Decreased safety awareness, Decreased strength, Postural dysfunction, Improper body mechanics, Decreased scar mobility, Decreased range of motion, Decreased mobility, Decreased balance, Hypomobility, Pain  Visit Diagnosis: Other abnormalities of  gait and mobility  Other lack of coordination  Unsteadiness on feet  History of falling     Problem List Patient Active Problem List   Diagnosis Date Noted  . Urinary urgency 05/27/2015  . Hypogonadism in male 05/27/2015  . Urinary frequency 04/15/2015  . BPH with obstruction/lower urinary tract symptoms 04/15/2015  . Abdominal pain 07/23/2014  . Abnormal gait 07/23/2014  . Absolute anemia 07/23/2014  . Cognitive disorder 07/23/2014  . Narrowing of intervertebral disc space 07/23/2014  . Clinical depression 07/23/2014  . Acid reflux 07/23/2014  . Abdominal hernia 07/23/2014  . HLD (hyperlipidemia) 07/23/2014  . BP (high blood pressure) 07/23/2014  . Amnesia 07/23/2014  . 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 Scoliosis 07/23/2014  . Compressed spine fracture ([place]) 07/23/2014  . Spinal stenosis 07/23/2014  . Facial numbness 06/03/2014  . Spasm 04/09/2014  . Amnestic syndrome, drug-induced ([place]) 12/08/2013  . Obstructive apnea 12/08/2013  . Generalized OA 11/19/2013  . Bad memory 11/19/2013  . Foot pain 09/04/2013  . Polypharmacy 08/20/2013  . 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 Abnormal finding on thyroid function test 07/30/2013  . Daytime somnolence 07/30/2013  . Encounter for screening for lipoid disorders 07/30/2013  . OP (osteoporosis) 07/30/2013  . Chronic pain 06/29/2013  . Cannot sleep 06/29/2013  . Dorsopathy 06/29/2013  . Dermatitis, stasis 06/29/2013  . Blue color skin 06/05/2013  . Excessive falling 06/05/2013  . Able to mobilize using mobility aids 06/05/2013  . Curvature of spine 10/10/2012  . Acquired deformities of spine 10/10/2012  . 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 FOM (frequency of micturition) 08/21/2011  . Cervical spondylosis without myelopathy 05/03/2011  . Cervical osteoarthritis 05/03/2011  . DDD (degenerative disc disease), lumbosacral 05/03/2011  . Angulation of spine 05/03/2011  . LBP (low back  pain) 05/03/2011  . Degenerative arthritis of lumbar spine 05/03/2011  . Lumbosacral spondylosis 05/03/2011  . Back pain, chronic 03/29/2011  .  Benign fibroma of prostate 03/29/2011  . Urgency of micturation 03/29/2011  . Hernia, incisional 08/21/2010  . Exomphalos 08/21/2010  . Arthritis 08/07/2010  . CN (constipation) 08/07/2010  . Exposure to radiation 03/15/1953    Manfred Arch SPT Lamine Laton PT DPT 02/20/2016, 3:07 PM  [facility] [address], [place], [address] Phone: [phone]   Fax:  [phone]  Name: Dustin Turner MRN: [id number] Date of Birth: March 18, 1935

## 2016-02-27 ENCOUNTER — Ambulatory Visit: Payer: Medicare Other | Admitting: Physical Therapy

## 2016-02-27 DIAGNOSIS — R2689 Other abnormalities of gait and mobility: Secondary | ICD-10-CM

## 2016-02-27 DIAGNOSIS — R2681 Unsteadiness on feet: Secondary | ICD-10-CM

## 2016-02-27 DIAGNOSIS — Z9181 History of falling: Secondary | ICD-10-CM

## 2016-02-27 DIAGNOSIS — R278 Other lack of coordination: Secondary | ICD-10-CM

## 2016-02-29 NOTE — Therapy (Signed)
[facility] [address] [place], [address] Phone: [phone]   Fax:  [phone]  Physical Therapy Treatment  Patient Details  Name: Dustin Turner MRN: [id number] Date of Birth: 04-04-1935 Referring Provider: Barbaraann Boys, MD  Encounter Date: 02/27/2016    Past Medical History:  Diagnosis Date  . Arthritis   . BPH (benign prostatic hyperplasia)   . DDD (degenerative disc disease), cervical   . Hypogonadism in male   . Insomnia   . Neurogenic bladder   . OP (osteoporosis)   . Scoliosis   . Spinal stenosis   . Urinary urgency   . Venous stasis     Past Surgical History:  Procedure Laterality Date  . CERVICAL SPINE SURGERY    . HERNIA REPAIR    . JOINT REPLACEMENT    . LUMBAR SPINE SURGERY    . Proleive System Treatment  2006  . ROTATOR CUFF REPAIR    . THORACIC SPINE SURGERY      There were no vitals filed for this visit.     Objectives:  Reviewed HEP, tai chi exercises of Flying Marylou Mccoy, Picking the [employer], Opening the Window, and Holding the [place]. (see previous note for description of these exercises) Focus of these exercises to increase UE and spine ROM, improve upright posture, and facilitate deep breathing and allow pt to perform some physical activity pain free for improved quality of life.  Pt performed 2 cycles of 5 reps of each exercise. Pt had a few shallow breaths at start of the first exercise, pt cued to perform inhalations/exhalations at a 5 count pace to promote slow, controlled, deep breathing. Pt able to perform deep breathing and slightly slowed pace of exercises. Pt took brief rest break after each exercise to avoid muscle spasm pain in ribcage.                               PT Long Term Goals - 02/06/16 2106      PT LONG TERM GOAL #1   Title Pt will demo/ report IND with performing his tai chi practice without mm spasms nor loss of  balance in order to return to his hobby and engage in a relaxing activity for overall health and pelvic health wellness    Time 12   Period Weeks   Status Revised     PT LONG TERM GOAL #2   Title Pt will demo proper technique for kegel with lengthening of pelvic floor mm in order to initiate urine during day/night with less 5-10 min    Time 12   Period Weeks   Status Achieved     PT LONG TERM GOAL #3   Title Pt will be able to decrease his use of his urinal in car by 50% of the time in order to travel.    Time 12   Period Weeks   Status Achieved     PT LONG TERM GOAL #4   Title Pt will decrease his score on NIH-CPSI from 49% to < 44% in order to demo improved ability to urinate will less difficulty.    Time 12   Period Weeks   Status On-going     PT LONG TERM GOAL #5   Title Pt will demo decreased PSQI from 71% to < 61% in order to demo improved QOL   Time 12   Period Weeks  Status On-going     Additional Long Term Goals   Additional Long Term Goals Yes     PT LONG TERM GOAL #6   Title Patient will score a <16 sec on the TUG to demonstrate significant improvement for decreasing fall risk   Baseline 19.16sec   Time 12   Period Weeks   Status On-going     PT LONG TERM GOAL #7   Title Patient will score <14sec on the five time sit to stand to demonstrate significant improvement in LE strength and improved ability to transfer from sitting to standing   Baseline 16.6 sec   Time 12   Period Weeks   Status On-going     PT LONG TERM GOAL #8   Title Patient will score >33ms on the 10 m walk test to more safely ambulate across the street   Baseline .58 m/s   Time 12   Period Weeks   Status On-going     PT LONG TERM GOAL  #9   TITLE Pt will report decreased mm spasms in the evening after performing HEP across 2 days in order to be IND and compliance with repetitions and customized HEP for maintainence   Time 12   Period Weeks   Status Achieved     PT LONG TERM GOAL   #10   TITLE Pt will report decreased nocturia from 5-6 x/ night to 1-4x/ night across 2 weeks in order to improve sleep    Time 12   Period Weeks   Status New             Patient will benefit from skilled therapeutic intervention in order to improve the following deficits and impairments:  Abnormal gait, Decreased activity tolerance, Decreased coordination, Decreased safety awareness, Decreased strength, Postural dysfunction, Improper body mechanics, Decreased scar mobility, Decreased range of motion, Decreased mobility, Decreased balance, Hypomobility, Pain  Visit Diagnosis: History of falling  Other lack of coordination  Unsteadiness on feet  Other abnormalities of gait and mobility     Problem List Patient Active Problem List   Diagnosis Date Noted  . Urinary urgency 05/27/2015  . Hypogonadism in male 05/27/2015  . Urinary frequency 04/15/2015  . BPH with obstruction/lower urinary tract symptoms 04/15/2015  . Abdominal pain 07/23/2014  . Abnormal gait 07/23/2014  . Absolute anemia 07/23/2014  . Cognitive disorder 07/23/2014  . Narrowing of intervertebral disc space 07/23/2014  . Clinical depression 07/23/2014  . Acid reflux 07/23/2014  . Abdominal hernia 07/23/2014  . HLD (hyperlipidemia) 07/23/2014  . BP (high blood pressure) 07/23/2014  . Amnesia 07/23/2014  . 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 Scoliosis 07/23/2014  . [place]ressed [place] 07/23/2014  . Spinal stenosis 07/23/2014  . Facial numbness 06/03/2014  . Spasm 04/09/2014  . Amn[place] syndrome[place] 12/08/2013  . Obstructive apnea 12/08/2013  . Generalized OA 11/19/2013  . Bad memory 11/19/2013  . Foot pain 09/04/2013  . Polypharmacy 08/20/2013  . 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 Abnormal finding on thyroid function test 07/30/2013  . Daytime somnolence 07/30/2013  . Encounter for screening for lipoid disorders 07/30/2013  . OP (osteoporosis)  07/30/2013  . Chronic pain 06/29/2013  . Cannot sleep 06/29/2013  . Dorsopathy 06/29/2013  . Dermatitis, stasis 06/29/2013  . Blue color skin 06/05/2013  . Excessive falling 06/05/2013  . Able to mobilize using mobility aids 06/05/2013  . Curvature of spine 10/10/2012  . Acquired deformities of spine 10/10/2012  . Failed back syndrome of  lumbar spine 05/05/2012  . FOM (frequency of micturition) 08/21/2011  . Cervical spondylosis without myelopathy 05/03/2011  . Cervical osteoarthritis 05/03/2011  . DDD (degenerative disc disease), lumbosacral 05/03/2011  . Angulation of spine 05/03/2011  . LBP (low back pain) 05/03/2011  . Degenerative arthritis of lumbar spine 05/03/2011  . Lumbosacral spondylosis 05/03/2011  . Back pain, chronic 03/29/2011  . Benign fibroma of prostate 03/29/2011  . Urgency of micturation 03/29/2011  . Hernia, incisional 08/21/2010  . Exomphalos 08/21/2010  . Arthritis 08/07/2010  . CN (constipation) 08/07/2010  . Exposure to radiation 03/15/1953   Manfred Arch SPT Fisher,Benjamin PT DPT 02/29/2016, 10:40 AM  [facility] [address] [place], [address] Phone: [phone]   Fax:  [phone]  Name: Dustin Turner MRN: [id number] Date of Birth: Jun 12, 1935

## 2016-03-06 ENCOUNTER — Encounter: Payer: Self-pay | Admitting: Urology

## 2016-03-06 ENCOUNTER — Ambulatory Visit (INDEPENDENT_AMBULATORY_CARE_PROVIDER_SITE_OTHER): Payer: Medicare Other | Admitting: Urology

## 2016-03-06 VITALS — BP 130/70 | HR 98 | Ht 67.0 in | Wt 163.9 lb

## 2016-03-06 DIAGNOSIS — R351 Nocturia: Secondary | ICD-10-CM

## 2016-03-06 NOTE — Progress Notes (Signed)
03/06/2016 3:58 PM   Dustin Turner 1936/01/15 [id number]  Referring provider: Barbaraann Boys, MD [address] [place], [place] [address]  Chief Complaint  Patient presents with  . Follow-up    HPI: The patient is a 81 year old gentleman treated by Dr. Elnoria Howard for nighttime frequency. Physical therapy reduced his nighttime frequency from 3-6 to 1-4. He recently regressed back to 5 times. He wears ankle hose to reduce ankle edema. He has tried [employer] and perhaps other medication. He takes the beta 3 agonists 50 mg every second day. There was question whether or not it would cause retention when he was taking it every day. He had a thermotherapy prostate treatment in 2008. His flow was good  He has had 6 back operations. He wears a brace for scoliosis.    The patient has minimal frequency and can void with a good flow every 3-4 hours. He has significant nighttime frequency. He has exhausted medication. The future role of DDAVP and current role of percutaneous tibial nerve stimulation was discussed. He was very well educated around the topic matter and I explained to him that I felt the Botox InterStim were not good options currently. He would like to try to beta 3 agonist daily and be reassessed in one month. The pathophysiology was discussed. He was interested in percutaneous tibial nerve stimulation  Today  Frequency stable. Nighttime frequency continues. Clinically not infected. Patient did not to the beta 3 agonist  PMH: Past Medical History:  Diagnosis Date  . Arthritis   . BPH (benign prostatic hyperplasia)   . DDD (degenerative disc disease), cervical   . Hypogonadism in male   . Insomnia   . Neurogenic bladder   . OP (osteoporosis)   . Scoliosis   . Spinal stenosis   . Urinary urgency   . Venous stasis     Surgical History: Past Surgical History:  Procedure Laterality Date  . CERVICAL SPINE SURGERY    . HERNIA REPAIR    . JOINT REPLACEMENT    .  LUMBAR SPINE SURGERY    . Proleive System Treatment  2006  . ROTATOR CUFF REPAIR    . THORACIC SPINE SURGERY      Home Medications:  Allergies as of 03/06/2016      Reactions   Amitriptyline    Nortriptyline Other (See Comments)   GI  upset      Medication List       Accurate as of 03/06/16  3:58 PM. Always use your most recent med list.          alendronate 70 MG tablet Commonly known as:  FOSAMAX   ammonium lactate 12 % lotion Commonly known as:  LAC-HYDRIN Apply topically.   ARICEPT 10 MG tablet Generic drug:  donepezil 10 mg.   CALCIUM 500/D 500-200 MG-UNIT tablet Generic drug:  calcium-vitamin D Take by mouth.   ciprofloxacin 0.3 % ophthalmic solution Commonly known as:  CILOXAN Reported on 05/10/2015   D 400 400 units Chew Generic drug:  Cholecalciferol Chew by mouth. Reported on 04/11/2015   DULoxetine 30 MG capsule Commonly known as:  CYMBALTA Take 30 mg by mouth daily.   erythromycin ophthalmic ointment Reported on 05/10/2015   Eszopiclone 3 MG Tabs Reported on 04/11/2015   fentaNYL 800 MCG lollipop Commonly known as:  ACTIQ Reported on 04/11/2015   FISH OIL + D3 1000-1000 MG-UNIT Caps Take by mouth.   gabapentin 400 MG capsule Commonly known as:  NEURONTIN Take  400 mg by mouth.   HYDROmorphone (PF) 250 MG injection Commonly known as:  DILAUDID HP Inject 10 mg as directed continuously.   ibuprofen 800 MG tablet Commonly known as:  ADVIL,MOTRIN Take 800 mg by mouth.   LINZESS 290 MCG Caps capsule Generic drug:  linaclotide   LORZONE 750 MG Tabs Generic drug:  Chlorzoxazone Take by mouth. Reported on 04/11/2015   magnesium hydroxide 400 MG/5ML suspension Commonly known as:  MILK OF MAGNESIA Take by mouth. Reported on 04/11/2015   memantine 10 MG tablet Commonly known as:  NAMENDA Take by mouth.   METANX 3-90.314-2-35 MG Caps Reported on 04/11/2015   methylphenidate 10 MG tablet Commonly known as:  RITALIN Take by mouth.     mexiletine 200 MG capsule Commonly known as:  MEXITIL Reported on 04/11/2015   mirabegron ER 50 MG Tb24 tablet Commonly known as:  MYRBETRIQ Take 1 tablet (50 mg total) by mouth daily.   MOVANTIK 12.5 MG Tabs tablet Generic drug:  naloxegol oxalate   NAPROXEN DR 500 MG EC tablet Generic drug:  naproxen Take by mouth.   NARCAN 4 MG/0.1ML Liqd nasal spray kit Generic drug:  naloxone Reported on 04/11/2015   oxyCODONE 40 mg 12 hr tablet Commonly known as:  OXYCONTIN Take by mouth. Reported on 04/11/2015   PEN NEEDLES 31GX5/16" 31G X 8 MM Misc 1 each by Does not apply route.   RESTASIS 0.05 % ophthalmic emulsion Generic drug:  cycloSPORINE   SUPER BIOTIN 5 MG Tabs Generic drug:  Biotin 10,000 mg.   Testosterone 20.25 MG/ACT (1.62%) Gel Place onto the skin.   triamcinolone cream 0.1 % Commonly known as:  KENALOG apply to affected area twice a day UNTIL IMPROVED   vitamin B-12 1000 MCG tablet Commonly known as:  CYANOCOBALAMIN Take by mouth.   vitamin C 1000 MG tablet Take 460 mg by mouth.   VOLTAREN 1 % Gel Generic drug:  diclofenac sodium Apply topically.       Allergies:  Allergies  Allergen Reactions  . Amitriptyline   . Nortriptyline Other (See Comments)    GI  upset    Family History: Family History  Problem Relation Age of Onset  . Diabetes Father   . Stroke Father   . Alcohol abuse Father   . Diabetes Brother   . Stroke Mother   . Alcohol abuse Brother   . Kidney disease Neg Hx   . Prostate cancer Neg Hx     Social History:  reports that he has never smoked. He has never used smokeless tobacco. He reports that he does not drink alcohol or use drugs.  ROS: UROLOGY Frequent Urination?: Yes Hard to postpone urination?: Yes Burning/pain with urination?: No Get up at night to urinate?: Yes Leakage of urine?: No Urine stream starts and stops?: No Trouble starting stream?: Yes Do you have to strain to urinate?: No Blood in urine?:  No Urinary tract infection?: No Sexually transmitted disease?: No Injury to kidneys or bladder?: No Painful intercourse?: No Weak stream?: No Erection problems?: No Penile pain?: No  Gastrointestinal Nausea?: No Vomiting?: No Indigestion/heartburn?: No Diarrhea?: No Constipation?: Yes  Constitutional Fever: No Night sweats?: No Weight loss?: Yes Fatigue?: No  Skin Skin rash/lesions?: No Itching?: No  Eyes Blurred vision?: No Double vision?: No  Ears/Nose/Throat Sore throat?: No Sinus problems?: No  Hematologic/Lymphatic Swollen glands?: No Easy bruising?: No  Cardiovascular Leg swelling?: No Chest pain?: No  Respiratory Cough?: No Shortness of breath?: No  Endocrine Excessive  thirst?: No  Musculoskeletal Back pain?: No Joint pain?: No  Neurological Headaches?: No Dizziness?: No  Psychologic Depression?: No Anxiety?: No  Physical Exam: BP 130/70 (BP Location: Left Arm, Patient Position: Sitting, Cuff Size: Normal)   Pulse 98   Ht 5' 7"  (1.702 m)   Wt 74.3 kg (163 lb 14.4 oz)   BMI 25.67 kg/m     Laboratory Data: Lab Results  Component Value Date   WBC 5.1 05/22/2015   HGB 14.8 05/22/2015   HCT 44.1 05/22/2015   MCV 95.2 05/22/2015   PLT 116 (L) 05/22/2015    Lab Results  Component Value Date   CREATININE 0.96 05/22/2015    No results found for: PSA  Lab Results  Component Value Date   TESTOSTERONE 436 06/27/2015    No results found for: HGBA1C  Urinalysis    Component Value Date/Time   APPEARANCEUR Cloudy (A) 04/11/2015 1455   GLUCOSEU Negative 04/11/2015 1455   BILIRUBINUR Negative 04/11/2015 1455   PROTEINUR Negative 04/11/2015 1455   NITRITE Negative 04/11/2015 1455   LEUKOCYTESUR Negative 04/11/2015 1455    Pertinent Imaging: none  Assessment & Plan:  The patient continues to have nighttime frequency from his his quality of life. He has been refractory to medication. He is willing to do percutaneous tibial  nerve stimulation to travel is an issue. He would like to keep a diary and to be reassessed in one month for potential DDAVP  There are no diagnoses linked to this encounter.  Return in about 1 month (around 04/03/2016).  Reece Packer, MD  [facility] [address], [place], [place] [address] [phone]

## 2016-03-12 ENCOUNTER — Ambulatory Visit: Payer: Medicare Other | Admitting: Physical Therapy

## 2016-03-12 DIAGNOSIS — Z9181 History of falling: Secondary | ICD-10-CM

## 2016-03-12 DIAGNOSIS — R278 Other lack of coordination: Secondary | ICD-10-CM

## 2016-03-12 DIAGNOSIS — R2689 Other abnormalities of gait and mobility: Secondary | ICD-10-CM | POA: Diagnosis not present

## 2016-03-12 DIAGNOSIS — R2681 Unsteadiness on feet: Secondary | ICD-10-CM

## 2016-03-12 NOTE — Therapy (Signed)
[place] MAIN [facility] SERVICES [address] [place], [place], [address] Phone: [phone]   Fax:  [phone]  Physical Therapy Treatment  Patient Details  Name: Dustin Turner MRN: [id number] Date of Birth: 02-08-1935 Referring Provider: Barbaraann Boys, MD  Encounter Date: 03/12/2016      PT End of Session - 03/12/16 2005    Visit Number 17   Date for PT Re-Evaluation 04/16/16   Authorization Type 7/10   PT Start Time 1315   PT Stop Time 1358   PT Time Calculation (min) 43 min   Activity Tolerance Patient tolerated treatment well;No increased pain   Behavior During Therapy WFL for tasks assessed/performed      Past Medical History:  Diagnosis Date  . Arthritis   . BPH (benign prostatic hyperplasia)   . DDD (degenerative disc disease), cervical   . Hypogonadism in male   . Insomnia   . Neurogenic bladder   . OP (osteoporosis)   . Scoliosis   . Spinal stenosis   . Urinary urgency   . Venous stasis     Past Surgical History:  Procedure Laterality Date  . CERVICAL SPINE SURGERY    . HERNIA REPAIR    . JOINT REPLACEMENT    . LUMBAR SPINE SURGERY    . Proleive System Treatment  2006  . ROTATOR CUFF REPAIR    . THORACIC SPINE SURGERY      There were no vitals filed for this visit.      Subjective Assessment - 03/12/16 1318    Subjective Pt reported he has had 4 mm spasms across the past month. Pt has been practicing the modifications of tai chi and continues to receive massage therapy.  Pt 's nocturia has decreased from 3-6 epsiodes per night to 2-5 episodes.  Pt's knee has been better by staying off of it    Pertinent History OCD, Neuropathy, scoliosis, pain pump located at R abdomen, Spinal Cord stimulator, 6 spinal surgeries, L  inguinal hernia 2/2 implantation of the pain pump.  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  Physical activity: resistance bands    Limitations Sitting;Standing;Walking    Patient Stated Goals get up less at night, sleep better,    Pain Onset More than a month ago            [facility] PT Assessment - 03/12/16 1343      Sit to Stand   Comments cued to engage in pelvic floor activation on rise                  Pelvic Floor Special Questions - 03/12/16 1338    External Perineal Exam pt consented verbally through clothing   External Palpation 3 sec , 10 reps            [facility] Adult PT Treatment/Exercise - 03/12/16 1401      Ambulation/Gait   Gait velocity .59 m/s      Neuro-muscular re-edu:  Sit to-stand with pelvic floor contraction and alignment, 7 reps   Education about cycling on stationary pedal bike for aerobic and knee strengthening 15 min, day and progressing to 55mn x 2          PT Education - 03/12/16 2005    Education provided Yes   Education Details HEP   Person(s) Educated Patient   Methods Explanation;Demonstration;Tactile cues;Verbal cues;Handout   Comprehension Returned demonstration;Verbalized understanding  PT Long Term Goals - 03/12/16 1320      PT LONG TERM GOAL #1   Title Pt will demo/ report IND with performing his tai chi practice without mm spasms nor loss of balance in order to return to his hobby and engage in a relaxing activity for overall health and pelvic health wellness    Time 12   Period Weeks   Status Achieved     PT LONG TERM GOAL #2   Title Pt will demo proper technique for kegel with lengthening of pelvic floor mm in order to initiate urine during day/night with less 5-10 min    Time 12   Period Weeks   Status Achieved     PT LONG TERM GOAL #3   Title Pt will be able to decrease his use of his urinal in car by 50% of the time in order to travel.    Time 12   Period Weeks   Status Achieved     PT LONG TERM GOAL #4   Title Pt will decrease his score on NIH-CPSI from 49% to < 44% in order to demo improved ability to urinate will less difficulty.    Time 12   Period  Weeks   Status On-going     PT LONG TERM GOAL #5   Title Pt will demo decreased PSQI from 71% to < 61% in order to demo improved QOL  (2/26: 43%)    Time 12   Period Weeks   Status Achieved     PT LONG TERM GOAL #6   Title Patient will score a <16 sec on the TUG to demonstrate significant improvement for decreasing fall risk   Baseline 19.16sec   Time 12   Period Weeks   Status On-going     PT LONG TERM GOAL #7   Title Patient will score <14sec on the five time sit to stand to demonstrate significant improvement in LE strength and improved ability to transfer from sitting to standing   Baseline 16.6 sec   Time 12   Period Weeks   Status On-going     PT LONG TERM GOAL #8   Title Patient will score >35ms on the 10 m walk test to more safely ambulate across the street   Baseline .58 m/s   Time 12   Period Weeks   Status On-going     PT LONG TERM GOAL  #9   TITLE Pt will report decreased mm spasms in the evening after performing HEP across 2 days in order to be IND and compliance with repetitions and customized HEP for maintainence   Time 12   Period Weeks   Status Achieved     PT LONG TERM GOAL  #10   TITLE Pt will report decreased nocturia from 5-6 x/ night to 1-4x/ night across 2 weeks in order to improve sleep    Time 12   Period Weeks   Status On-going               Plan - 03/12/16 2006    Clinical Impression Statement Pt demo'd proper  technique with pelvic floor exercises and was advised to continue with endurance straining with pelvic floor porgram to continue making improvements with nocturia episodes which has decreased to 2-5 epsiodes night. Pt was explained the rationale that mm require 6-8 weeks to strengthen. Pt voiced understanding. pt reported 4 mm spasms across the past 4 weeks. Pt voiced understanding to perform his exercises without over  doing them. Progressed pt to incorporate pelvic floor contraction with against gravity activties (sit to stand)  which pt demo'd properly. Pt's L knee pain has decreased as pt has avoided increased standing/walking activities. Recommended pt use a stationary pedal exercise bike to maintain leg strength and aerobic health for 15 min x day, building up to 15 x 2 /day. Spoke with massage therapist re: this recommendation on proper set-up because she visits his home.   Pt continues to benefit from skilled PT.    Rehab Potential Good   Clinical Impairments Affecting Rehab Potential scoliosis, multiple surgeries, psychological factors (anxiety, OCD), sleep disorder   PT Frequency 1x / week   PT Duration 12 weeks   PT Treatment/Interventions ADLs/Self Care Home Management;Aquatic Therapy;Moist Heat;Traction;Gait training;Stair training;Functional mobility training;Neuromuscular re-education;Balance training;Therapeutic activities;Patient/family education;Manual lymph drainage;Scar mobilization;Therapeutic exercise;Cryotherapy;Energy conservation;Other (comment)  relaxation techniques   PT Next Visit Plan Static and dynamic balance interventions, possibility of using platform walker   PT Home Exercise Plan Ambulating with walking sticks    Consulted and Agree with Plan of Care Patient      Patient will benefit from skilled therapeutic intervention in order to improve the following deficits and impairments:  Abnormal gait, Decreased activity tolerance, Decreased coordination, Decreased safety awareness, Decreased strength, Postural dysfunction, Improper body mechanics, Decreased scar mobility, Decreased range of motion, Decreased mobility, Decreased balance, Hypomobility, Pain  Visit Diagnosis: History of falling  Other lack of coordination  Unsteadiness on feet  Other abnormalities of gait and mobility     Problem List Patient Active Problem List   Diagnosis Date Noted  . Urinary urgency 05/27/2015  . Hypogonadism in male 05/27/2015  . Urinary frequency 04/15/2015  . BPH with obstruction/lower urinary  tract symptoms 04/15/2015  . Abdominal pain 07/23/2014  . Abnormal gait 07/23/2014  . Absolute anemia 07/23/2014  . Cognitive disorder 07/23/2014  . Narrowing of intervertebral disc space 07/23/2014  . Clinical depression 07/23/2014  . Acid reflux 07/23/2014  . Abdominal hernia 07/23/2014  . HLD (hyperlipidemia) 07/23/2014  . BP (high blood pressure) 07/23/2014  . Amnesia 07/23/2014  . 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 Scoliosis 07/23/2014  . Compressed spine fracture ([place]) 07/23/2014  . Spinal stenosis 07/23/2014  . Facial numbness 06/03/2014  . Spasm 04/09/2014  . Amnestic syndrome, drug-induced ([place]) 12/08/2013  . Obstructive apnea 12/08/2013  . Generalized OA 11/19/2013  . Bad memory 11/19/2013  . Foot pain 09/04/2013  . Polypharmacy 08/20/2013  . 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 Abnormal finding on thyroid function test 07/30/2013  . Daytime somnolence 07/30/2013  . Encounter for screening for lipoid disorders 07/30/2013  . OP (osteoporosis) 07/30/2013  . Chronic pain 06/29/2013  . Cannot sleep 06/29/2013  . Dorsopathy 06/29/2013  . Dermatitis, stasis 06/29/2013  . Blue color skin 06/05/2013  . Excessive falling 06/05/2013  . Able to mobilize using mobility aids 06/05/2013  . Curvature of spine 10/10/2012  . Acquired deformities of spine 10/10/2012  . 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 FOM (frequency of micturition) 08/21/2011  . Cervical spondylosis without myelopathy 05/03/2011  . Cervical osteoarthritis 05/03/2011  . DDD (degenerative disc disease), lumbosacral 05/03/2011  . Angulation of spine 05/03/2011  . LBP (low back pain) 05/03/2011  . Degenerative arthritis of lumbar spine 05/03/2011  . Lumbosacral spondylosis 05/03/2011  . Back pain, chronic 03/29/2011  . Benign fibroma of prostate 03/29/2011  . Urgency of micturation 03/29/2011  . Hernia, incisional 08/21/2010  . Exomphalos 08/21/2010  . Arthritis 08/07/2010   .  CN (constipation) 08/07/2010  . Exposure to radiation 03/15/1953    Jerl Mina ,PT, DPT, E-RYT  03/12/2016, 8:12 PM  [place] MAIN [facility] SERVICES [address] [place], [place], [address] Phone: [phone]   Fax:  [phone]  Name: Gumecindo Hopkin MRN: [id number] Date of Birth: 1935/12/10

## 2016-03-12 NOTE — Patient Instructions (Addendum)
Continue with 10 sec holds with 3 rest breaths between reps (3 reps)  3 x day   _______    Proper body mechanics with getting out of a chair to decrease strain  on back &pelvic floor   Avoid holding your breath when Getting out of the chair:  Scoot to front part of chair  Heels behind feet, feet are hip width apart, nose over toes   Inhale like you are smelling roses Exhale to stand and quick squeeze of pelvic floor   ________  Recommended Pedal Stationary Bike for aerobic activity 15 min ( build up to 2x day) and knee strengthening. Sit in a chair with high seat to ensure hip and knee angle 90 deg once foot is on pedal

## 2016-03-15 ENCOUNTER — Ambulatory Visit: Payer: Medicare Other | Admitting: Physical Therapy

## 2016-03-16 HISTORY — PX: CARPAL TUNNEL RELEASE: SHX101

## 2016-03-19 ENCOUNTER — Ambulatory Visit: Payer: Medicare Other | Attending: Urology | Admitting: Physical Therapy

## 2016-03-19 DIAGNOSIS — R2689 Other abnormalities of gait and mobility: Secondary | ICD-10-CM | POA: Diagnosis present

## 2016-03-19 DIAGNOSIS — R2681 Unsteadiness on feet: Secondary | ICD-10-CM | POA: Diagnosis present

## 2016-03-19 DIAGNOSIS — R278 Other lack of coordination: Secondary | ICD-10-CM | POA: Diagnosis present

## 2016-03-19 DIAGNOSIS — Z9181 History of falling: Secondary | ICD-10-CM | POA: Insufficient documentation

## 2016-03-19 NOTE — Therapy (Signed)
[facility] [address], [place], [address] Phone: [phone]   Fax:  [phone]  Physical Therapy Treatment  Patient Details  Name: Dustin Turner MRN: [id number] Date of Birth: [birth year] Referring Provider: Barbaraann Boys, MD  Encounter Date: 03/19/2016      PT End of Session - 03/19/16 1622    Visit Number 18   Date for PT Re-Evaluation 04/16/16   Authorization Type 8/10   PT Start Time 5498   PT Stop Time 1618   PT Time Calculation (min) 48 min   Activity Tolerance Patient tolerated treatment well;No increased pain   Behavior During Therapy WFL for tasks assessed/performed      Past Medical History:  Diagnosis Date  . Arthritis   . BPH (benign prostatic hyperplasia)   . DDD (degenerative disc disease), cervical   . Hypogonadism in male   . Insomnia   . Neurogenic bladder   . OP (osteoporosis)   . Scoliosis   . Spinal stenosis   . Urinary urgency   . Venous stasis     Past Surgical History:  Procedure Laterality Date  . CERVICAL SPINE SURGERY    . HERNIA REPAIR    . JOINT REPLACEMENT    . LUMBAR SPINE SURGERY    . Proleive System Treatment  2006  . ROTATOR CUFF REPAIR    . THORACIC SPINE SURGERY      There were no vitals filed for this visit.      Subjective Assessment - 03/19/16 1539    Subjective Pt reports he has had several significant mm spasms over the past week. He is modifying his exercises due to this and will discuss this with primary treating PT   Pertinent History OCD, Neuropathy, scoliosis, pain pump located at R abdomen, Spinal Cord stimulator, 6 spinal surgeries, L  inguinal hernia 2/2 implantation of the pain pump.  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  Physical activity: resistance bands    Limitations Sitting;Standing;Walking   Patient Stated Goals get up less at night, sleep better,    Currently in Pain? No/denies   Pain Onset More than  a month ago                Objective: Performed focal breathing exercises with movement (tai chi) in seated focusing on ribcage control, rib expansion, tying in breathing with movement to facilitate the natural extension with inhalation and flexion with exhalation. Specific exercises of:  Grinding the corn  [place]enix spreads wings  Seated focal breathing with altered slump positions  Pressing the sky.  All exercises performed multiple reps with rest breaks to ensure pt is able to perform independently, unable to perform phoenix exercise well so will continue to work on this at next session.                 PT Education - 03/19/16 1622    Education provided Yes   Education Details HEP   Person(s) Educated Patient   Methods Explanation;Demonstration;Tactile cues;Verbal cues   Comprehension Verbalized understanding;Returned demonstration             PT Long Term Goals - 03/12/16 1320      PT LONG TERM GOAL #1   Title Pt will demo/ report IND with performing his tai chi practice without mm spasms nor loss of balance in order to return to his hobby and engage in a relaxing activity for overall health and pelvic  health wellness    Time 12   Period Weeks   Status Achieved     PT LONG TERM GOAL #2   Title Pt will demo proper technique for kegel with lengthening of pelvic floor mm in order to initiate urine during day/night with less 5-10 min    Time 12   Period Weeks   Status Achieved     PT LONG TERM GOAL #3   Title Pt will be able to decrease his use of his urinal in car by 50% of the time in order to travel.    Time 12   Period Weeks   Status Achieved     PT LONG TERM GOAL #4   Title Pt will decrease his score on NIH-CPSI from 49% to < 44% in order to demo improved ability to urinate will less difficulty.    Time 12   Period Weeks   Status On-going     PT LONG TERM GOAL #5   Title Pt will demo decreased PSQI from 71% to < 61% in order to demo  improved QOL  (2/26: 43%)    Time 12   Period Weeks   Status Achieved     PT LONG TERM GOAL #6   Title Patient will score a <16 sec on the TUG to demonstrate significant improvement for decreasing fall risk   Baseline 19.16sec   Time 12   Period Weeks   Status On-going     PT LONG TERM GOAL #7   Title Patient will score <14sec on the five time sit to stand to demonstrate significant improvement in LE strength and improved ability to transfer from sitting to standing   Baseline 16.6 sec   Time 12   Period Weeks   Status On-going     PT LONG TERM GOAL #8   Title Patient will score >41ms on the 10 m walk test to more safely ambulate across the street   Baseline .58 m/s   Time 12   Period Weeks   Status On-going     PT LONG TERM GOAL  #9   TITLE Pt will report decreased mm spasms in the evening after performing HEP across 2 days in order to be IND and compliance with repetitions and customized HEP for maintainence   Time 12   Period Weeks   Status Achieved     PT LONG TERM GOAL  #10   TITLE Pt will report decreased nocturia from 5-6 x/ night to 1-4x/ night across 2 weeks in order to improve sleep    Time 12   Period Weeks   Status On-going               Plan - 03/19/16 1623    Clinical Impression Statement Focus of tai chi session on seated exercises adding in breathing to decr. spasms and overall challenge of ther ex. also spent extensive time on HEP understanding, so pt is able to progress on his own following d/c from tai chi exercises.    Rehab Potential Good   Clinical Impairments Affecting Rehab Potential scoliosis, multiple surgeries, psychological factors (anxiety, OCD), sleep disorder   PT Frequency 1x / week   PT Duration 12 weeks   PT Treatment/Interventions ADLs/Self Care Home Management;Aquatic Therapy;Moist Heat;Traction;Gait training;Stair training;Functional mobility training;Neuromuscular re-education;Balance training;Therapeutic  activities;Patient/family education;Manual lymph drainage;Scar mobilization;Therapeutic exercise;Cryotherapy;Energy conservation;Other (comment)  relaxation techniques   PT Next Visit Plan Static and dynamic balance interventions, possibility of using platform walker   PT Home  Exercise Plan Ambulating with walking sticks    Consulted and Agree with Plan of Care Patient      Patient will benefit from skilled therapeutic intervention in order to improve the following deficits and impairments:  Abnormal gait, Decreased activity tolerance, Decreased coordination, Decreased safety awareness, Decreased strength, Postural dysfunction, Improper body mechanics, Decreased scar mobility, Decreased range of motion, Decreased mobility, Decreased balance, Hypomobility, Pain  Visit Diagnosis: Other lack of coordination  Unsteadiness on feet     Problem List Patient Active Problem List   Diagnosis Date Noted  . Urinary urgency 05/27/2015  . Hypogonadism in male 05/27/2015  . Urinary frequency 04/15/2015  . BPH with obstruction/lower urinary tract symptoms 04/15/2015  . Abdominal pain 07/23/2014  . Abnormal gait 07/23/2014  . Absolute anemia 07/23/2014  . Cognitive disorder 07/23/2014  . Narrowing of intervertebral disc space 07/23/2014  . Clinical depression 07/23/2014  . Acid reflux 07/23/2014  . Abdominal hernia 07/23/2014  . HLD (hyperlipidemia) 07/23/2014  . BP (high blood pressure) 07/23/2014  . Amnesia 07/23/2014  . 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 Scoliosis 07/23/2014  . Compressed spine fracture ([place]) 07/23/2014  . Spinal stenosis 07/23/2014  . Facial numbness 06/03/2014  . Spasm 04/09/2014  . Amnestic syndrome, drug-induced ([place]) 12/08/2013  . Obstructive apnea 12/08/2013  . Generalized OA 11/19/2013  . Bad memory 11/19/2013  . Foot pain 09/04/2013  . Polypharmacy 08/20/2013  . 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 Abnormal finding on thyroid function test  07/30/2013  . Daytime somnolence 07/30/2013  . Encounter for screening for lipoid disorders 07/30/2013  . OP (osteoporosis) 07/30/2013  . Chronic pain 06/29/2013  . Cannot sleep 06/29/2013  . Dorsopathy 06/29/2013  . Dermatitis, stasis 06/29/2013  . Blue color skin 06/05/2013  . Excessive falling 06/05/2013  . Able to mobilize using mobility aids 06/05/2013  . Curvature of spine 10/10/2012  . Acquired deformities of spine 10/10/2012  . 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 FOM (frequency of micturition) 08/21/2011  . Cervical spondylosis without myelopathy 05/03/2011  . Cervical osteoarthritis 05/03/2011  . DDD (degenerative disc disease), lumbosacral 05/03/2011  . Angulation of spine 05/03/2011  . LBP (low back pain) 05/03/2011  . Degenerative arthritis of lumbar spine 05/03/2011  . Lumbosacral spondylosis 05/03/2011  . Back pain, chronic 03/29/2011  . Benign fibroma of prostate 03/29/2011  . Urgency of micturation 03/29/2011  . Hernia, incisional 08/21/2010  . Exomphalos 08/21/2010  . Arthritis 08/07/2010  . CN (constipation) 08/07/2010  . Exposure to radiation 03/15/1953    Fisher,Benjamin PT DPT 03/19/2016, 4:40 PM  [facility] [address] [place], [address] Phone: [phone]   Fax:  [phone]  Name: Dustin Turner MRN: [id number] Date of Birth: Jan 31, 1935

## 2016-03-22 ENCOUNTER — Ambulatory Visit: Payer: Medicare Other | Admitting: Physical Therapy

## 2016-03-26 ENCOUNTER — Ambulatory Visit: Payer: Medicare Other | Admitting: Physical Therapy

## 2016-03-30 ENCOUNTER — Ambulatory Visit: Payer: Medicare Other | Admitting: Physical Therapy

## 2016-03-30 DIAGNOSIS — R2689 Other abnormalities of gait and mobility: Secondary | ICD-10-CM

## 2016-03-30 DIAGNOSIS — R2681 Unsteadiness on feet: Secondary | ICD-10-CM

## 2016-03-30 DIAGNOSIS — R278 Other lack of coordination: Secondary | ICD-10-CM | POA: Diagnosis not present

## 2016-03-30 DIAGNOSIS — Z9181 History of falling: Secondary | ICD-10-CM

## 2016-03-30 NOTE — Patient Instructions (Signed)
URGE SUPPRESION TECHNIQUES  ? These techniques are to be used to suppress those abnormally strong URGES to urinate, especially after you have already urinated < 1hr ago.  ? These steps do not have to be followed in order, and not all steps have to be used.   ? The purpose of these steps is to help you regain control of your bladder, to reduce the amount of urinary urgency, frequency, or leaking.  ? They take practice to master in controlling urgency.  Allow yourself to be okay with leaking when you first start practicing these steps. ? Practice these steps first at home, when you do not have to worry as much about leaking.  1. SIT DOWN.  Pressure on the pelvic floor inhibits the bladder.  Further pressure may help, such as sitting on a small rolled up towel. 2. LIGHT "KEGELS" using elevator imagery.  Breathe in, feel pelvic floor lower to "basement" level by the end of the inhalation. Exhale, feel pelvic floor gradually move up to "1st floor" which is the neutral position of pelvic floor. At the end of exhalation, feel pelvic floor lift higher at which you will perform a quick light squeeze (the muscles that hold back gas and urine).  Do not do hard, maximal contractions, as this will quickly fatigue the muscles and cause leakage. Perform this 5 repetitions in this pattern.  3. BREATHE & STAY CALM.  Breathing slowly and remaining calm will inhibit your sympathetic nervous system, which will in turn calm the bladder.   4. DISTRACTION.  Sit with a project that will engage your mind.  Anything that works for you - reading, word puzzles, crochet, knitting, checking email, balancing the checkbook, and so on. 5. VISUALIZATION.  Imagine that you are in a place/situation in which either you cannot or do not want to leave.  Examples: in a car and cannot stop; lying on a beach with a far walk to a restroom; at dinner with someone special.  If the urge persists after practicing these steps and feel you must go to  the bathroom, then it is imperative that you . . . . ? Walk slowly and calmly to the bathroom ? Maintain calm breathing ? Refrain from undressing until you are standing over the toilet Rushing to the restroom will only encourage the strong bladder urges and leaking.  Again, the more you practice, the easier these steps will become.

## 2016-03-30 NOTE — Therapy (Addendum)
[place] [facility] [address] [place], [place], [address] Phone: [phone]   Fax:  [phone]  Physical Therapy Treatment  Patient Details  Name: Dustin Turner MRN: [id number] Date of Birth: [birth year] Referring Provider: Barbaraann Boys, MD  Encounter Date: 03/30/2016      PT End of Session - 03/30/16 1414    Visit Number 19   Date for PT Re-Evaluation 04/16/16   Authorization Type 9/10   PT Start Time 1208   PT Stop Time 1245   PT Time Calculation (min) 37 min      Past Medical History:  Diagnosis Date  . Arthritis   . BPH (benign prostatic hyperplasia)   . DDD (degenerative disc disease), cervical   . Hypogonadism in male   . Insomnia   . Neurogenic bladder   . OP (osteoporosis)   . Scoliosis   . Spinal stenosis   . Urinary urgency   . Venous stasis     Past Surgical History:  Procedure Laterality Date  . CERVICAL SPINE SURGERY    . HERNIA REPAIR    . JOINT REPLACEMENT    . LUMBAR SPINE SURGERY    . Proleive System Treatment  2006  . ROTATOR CUFF REPAIR    . THORACIC SPINE SURGERY      There were no vitals filed for this visit.      Subjective Assessment - 03/30/16 1211    Subjective Pt has had not had mm spasms and is backing off on his exercises. Pt has been practicing his tai chi and in bed exercises. He enjoys returning to his tai chi practice for exercise. Pt has learned to modify the cat cow and standing exercises to decrease mm spasms. Pt reports his urgency has gotten so much better by 40% and not had any accidents since doing the long pelvic floor contractions. Getting up at night to use the bathroom still is an issue, he gets up 3-7 x/ night.  His caregivers are helping him to only void every 2 hours during the night. He finds himself getting the urge in less than two hours. The quantity of urine varies with "false alarms. "     Pertinent History OCD, Neuropathy, scoliosis, pain pump located  at R abdomen, Spinal Cord stimulator, 6 spinal surgeries, L  inguinal hernia 2/2 implantation of the pain pump.  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  Physical activity: resistance bands    Limitations Sitting;Standing;Walking   Patient Stated Goals get up less at night, sleep better,    Pain Onset More than a month ago            [facility] PT Assessment - 03/30/16 1413      Sit to Stand   Comments no cues required, proper technique                     [facility] Adult PT Treatment/Exercise - 03/30/16 1413      Therapeutic Activites    Therapeutic Activities --  urge protocol                     PT Long Term Goals - 03/30/16 1215      PT LONG TERM GOAL #1   Title Pt will demo/ report IND with performing his tai chi practice without mm spasms nor loss of balance in order to return to his hobby and engage in a relaxing activity for  overall health and pelvic health wellness    Time 12   Period Days   Status Achieved     PT LONG TERM GOAL #2   Title Pt will demo proper technique for kegel with lengthening of pelvic floor mm in order to initiate urine during day/night with less 5-10 min    Time 12   Period Weeks   Status Achieved     PT LONG TERM GOAL #3   Title Pt will be able to decrease his use of his urinal in car by 50% of the time in order to travel.    Period Weeks   Status Achieved     PT LONG TERM GOAL #4   Title Pt will decrease his score on NIH-CPSI from 49% to < 44% in order to demo improved ability to urinate will less difficulty.  ( 3/16: 23%)    Time 12   Status Achieved     PT LONG TERM GOAL #5   Title Pt will demo decreased PSQI from 71% to < 61% in order to demo improved QOL  (2/26: 43%)    Time 12   Period Weeks   Status Achieved     PT LONG TERM GOAL #6   Time 12     PT LONG TERM GOAL  #9   TITLE Pt will report decreased mm spasms in the evening after performing HEP across 2 days in order to be IND and  compliance with repetitions and customized HEP for maintainence   Time 12   Period Weeks   Status Achieved     PT LONG TERM GOAL  #10   TITLE Pt will report decreased nocturia from 5-6 x/ night to 1-4x/ night across 2 weeks in order to improve sleep    Time 12   Period Weeks   Status On-going               Plan - 03/30/16 1413    Clinical Impression Statement Pt is working his remaining goal which is to decrease nocturia episodes. Pt was taught the urge protocol which he voiced understanding and demo'd correctly. Pt continues require skilled PT. Pt will return in a month.     Rehab Potential Good   Clinical Impairments Affecting Rehab Potential scoliosis, multiple surgeries, psychological factors (anxiety, OCD), sleep disorder   PT Frequency 1x / week   PT Duration 12 weeks   PT Treatment/Interventions ADLs/Self Care Home Management;Aquatic Therapy;Moist Heat;Traction;Gait training;Stair training;Functional mobility training;Neuromuscular re-education;Balance training;Therapeutic activities;Patient/family education;Manual lymph drainage;Scar mobilization;Therapeutic exercise;Cryotherapy;Energy conservation;Other (comment)  relaxation techniques   PT Next Visit Plan Static and dynamic balance interventions, possibility of using platform walker   PT Home Exercise Plan Ambulating with walking sticks    Consulted and Agree with Plan of Care Patient      Patient will benefit from skilled therapeutic intervention in order to improve the following deficits and impairments:  Abnormal gait, Decreased activity tolerance, Decreased coordination, Decreased safety awareness, Decreased strength, Postural dysfunction, Improper body mechanics, Decreased scar mobility, Decreased range of motion, Decreased mobility, Decreased balance, Hypomobility, Pain  Visit Diagnosis: Other lack of coordination  Unsteadiness on feet  History of falling  Other abnormalities of gait and  mobility     Problem List Patient Active Problem List   Diagnosis Date Noted  . Urinary urgency 05/27/2015  . Hypogonadism in male 05/27/2015  . Urinary frequency 04/15/2015  . BPH with obstruction/lower urinary tract symptoms 04/15/2015  . Abdominal pain 07/23/2014  . Abnormal  gait 07/23/2014  . Absolute anemia 07/23/2014  . Cognitive disorder 07/23/2014  . Narrowing of intervertebral disc space 07/23/2014  . Clinical depression 07/23/2014  . Acid reflux 07/23/2014  . Abdominal hernia 07/23/2014  . HLD (hyperlipidemia) 07/23/2014  . BP (high blood pressure) 07/23/2014  . Amnesia 07/23/2014  . 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 Scoliosis 07/23/2014  . Compressed spine fracture ([place]) 07/23/2014  . Spinal stenosis 07/23/2014  . Facial numbness 06/03/2014  . Spasm 04/09/2014  . Amnestic syndrome, drug-induced ([place]) 12/08/2013  . Obstructive apnea 12/08/2013  . Generalized OA 11/19/2013  . Bad memory 11/19/2013  . Foot pain 09/04/2013  . Polypharmacy 08/20/2013  . 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 Abnormal finding on thyroid function test 07/30/2013  . Daytime somnolence 07/30/2013  . Encounter for screening for lipoid disorders 07/30/2013  . OP (osteoporosis) 07/30/2013  . Chronic pain 06/29/2013  . Cannot sleep 06/29/2013  . Dorsopathy 06/29/2013  . Dermatitis, stasis 06/29/2013  . Blue color skin 06/05/2013  . Excessive falling 06/05/2013  . Able to mobilize using mobility aids 06/05/2013  . Curvature of spine 10/10/2012  . Acquired deformities of spine 10/10/2012  . 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 FOM (frequency of micturition) 08/21/2011  . Cervical spondylosis without myelopathy 05/03/2011  . Cervical osteoarthritis 05/03/2011  . DDD (degenerative disc disease), lumbosacral 05/03/2011  . Angulation of spine 05/03/2011  . LBP (low back pain) 05/03/2011  . Degenerative arthritis of lumbar spine 05/03/2011  . Lumbosacral  spondylosis 05/03/2011  . Back pain, chronic 03/29/2011  . Benign fibroma of prostate 03/29/2011  . Urgency of micturation 03/29/2011  . Hernia, incisional 08/21/2010  . Exomphalos 08/21/2010  . Arthritis 08/07/2010  . CN (constipation) 08/07/2010  . Exposure to radiation 03/15/1953    Jerl Mina ,PT, DPT, E-RYT  03/30/2016, 2:15 PM  [place] [facility]IN [facility] SERVICES [address] [place], [place], [address] Phone: [phone]   Fax:  [phone]  Name: Angell Honse MRN: [id number] Date of Birth: 1935-03-08

## 2016-04-02 ENCOUNTER — Ambulatory Visit: Payer: Self-pay

## 2016-04-02 ENCOUNTER — Ambulatory Visit (INDEPENDENT_AMBULATORY_CARE_PROVIDER_SITE_OTHER): Payer: Medicare Other | Admitting: Urology

## 2016-04-02 ENCOUNTER — Encounter: Payer: Self-pay | Admitting: Urology

## 2016-04-02 VITALS — BP 129/81 | HR 79 | Ht 65.5 in | Wt 163.5 lb

## 2016-04-02 DIAGNOSIS — R351 Nocturia: Secondary | ICD-10-CM

## 2016-04-02 NOTE — Progress Notes (Signed)
04/02/2016 3:05 PM   Dustin Turner 07-Mar-1935 [id number]  Referring provider: Barbaraann Boys, MD [place], [place] [address]  Chief Complaint  Patient presents with  . Follow-up    urinary frequency     HPI: The patient is a 81 year old gentleman treated by Dr. Elnoria Howard for nighttime frequency. Physical therapy reduced his nighttime frequency from 3-6 to 1-4. He recently regressed back to 5 times. He wears ankle hose to reduce ankle edema. He has tried [employer] and perhaps other medication. He takes the beta 3 agonists 50 mg every second day. There was question whether or not it would cause retention when he was taking it every day. He had a thermotherapy prostate treatment in 2008. His flow was good  He has had 6 back operations. He wears a brace for scoliosis.    The patient has minimal frequency and can void with a good flow every 3-4 hours. He has significant nighttime frequency. He has exhausted medication. The future role of DDAVP and current role of percutaneous tibial nerve stimulation was discussed. He was very well educated around the topic matter and I explained to him that I felt the Botox InterStim were not good options currently. He would like to try to beta 3 agonistdaily and be reassessed in one month. The pathophysiology was discussed. He was interested in percutaneous tibial nerve stimulation   Today He failed the beta 3 agonist. Frequency is stable. He kept a diary. He wanted to be assess for potential DDAVP but he is willing to percutaneous tibial nerve stimulation in the future if needed. Nighttime frequency is stable  Medically noninfected   PMH: Past Medical History:  Diagnosis Date  . Arthritis   . BPH (benign prostatic hyperplasia)   . DDD (degenerative disc disease), cervical   . Hypogonadism in male   . Insomnia   . Neurogenic bladder   . OP (osteoporosis)   . Scoliosis   . Spinal stenosis   . Urinary urgency   . Venous  stasis     Surgical History: Past Surgical History:  Procedure Laterality Date  . CERVICAL SPINE SURGERY    . HERNIA REPAIR    . JOINT REPLACEMENT    . LUMBAR SPINE SURGERY    . Proleive System Treatment  2006  . ROTATOR CUFF REPAIR    . THORACIC SPINE SURGERY      Home Medications:  Allergies as of 04/02/2016      Reactions   Amitriptyline    Nortriptyline Other (See Comments)   GI  upset      Medication List       Accurate as of 04/02/16  3:05 PM. Always use your most recent med list.          alendronate 70 MG tablet Commonly known as:  FOSAMAX   ammonium lactate 12 % lotion Commonly known as:  LAC-HYDRIN Apply topically.   ARICEPT 10 MG tablet Generic drug:  donepezil 10 mg.   CALCIUM 500/D 500-200 MG-UNIT tablet Generic drug:  calcium-vitamin D Take by mouth.   ciprofloxacin 0.3 % ophthalmic solution Commonly known as:  CILOXAN Reported on 05/10/2015   clonazePAM 0.5 MG tablet Commonly known as:  KLONOPIN Take by mouth.   D 400 400 units Chew Generic drug:  Cholecalciferol Chew by mouth. Reported on 04/11/2015   DULoxetine 30 MG capsule Commonly known as:  CYMBALTA Take 30 mg by mouth daily.   erythromycin ophthalmic ointment Reported on 05/10/2015  Eszopiclone 3 MG Tabs Reported on 04/11/2015   fentaNYL 800 MCG lollipop Commonly known as:  ACTIQ Reported on 04/11/2015   FISH OIL + D3 1000-1000 MG-UNIT Caps Take by mouth.   gabapentin 400 MG capsule Commonly known as:  NEURONTIN Take 400 mg by mouth.   HYDROmorphone (PF) 250 MG injection Commonly known as:  DILAUDID HP Inject 10 mg as directed continuously.   ibuprofen 800 MG tablet Commonly known as:  ADVIL,MOTRIN Take 800 mg by mouth.   LINZESS 290 MCG Caps capsule Generic drug:  linaclotide   LORZONE 750 MG Tabs Generic drug:  Chlorzoxazone Take by mouth. Reported on 04/11/2015   magnesium hydroxide 400 MG/5ML suspension Commonly known as:  MILK OF MAGNESIA Take by  mouth. Reported on 04/11/2015   memantine 10 MG tablet Commonly known as:  NAMENDA Take by mouth.   METANX 3-90.314-2-35 MG Caps Reported on 04/11/2015   methylphenidate 10 MG tablet Commonly known as:  RITALIN Take by mouth.   mexiletine 200 MG capsule Commonly known as:  MEXITIL Reported on 04/11/2015   mirabegron ER 50 MG Tb24 tablet Commonly known as:  MYRBETRIQ Take 1 tablet (50 mg total) by mouth daily.   MOVANTIK 12.5 MG Tabs tablet Generic drug:  naloxegol oxalate   NAPROXEN DR 500 MG EC tablet Generic drug:  naproxen Take by mouth.   NARCAN 4 MG/0.1ML Liqd nasal spray kit Generic drug:  naloxone Reported on 04/11/2015   oxyCODONE 40 mg 12 hr tablet Commonly known as:  OXYCONTIN Take by mouth. Reported on 04/11/2015   PEN NEEDLES 31GX5/16" 31G X 8 MM Misc 1 each by Does not apply route.   RESTASIS 0.05 % ophthalmic emulsion Generic drug:  cycloSPORINE   SUPER BIOTIN 5 MG Tabs Generic drug:  Biotin 10,000 mg.   Testosterone 20.25 MG/ACT (1.62%) Gel Place onto the skin.   triamcinolone cream 0.1 % Commonly known as:  KENALOG apply to affected area twice a day UNTIL IMPROVED   vitamin B-12 1000 MCG tablet Commonly known as:  CYANOCOBALAMIN Take by mouth.   vitamin C 1000 MG tablet Take 460 mg by mouth.   VOLTAREN 1 % Gel Generic drug:  diclofenac sodium Apply topically.       Allergies:  Allergies  Allergen Reactions  . Amitriptyline   . Nortriptyline Other (See Comments)    GI  upset    Family History: Family History  Problem Relation Age of Onset  . Diabetes Father   . Stroke Father   . Alcohol abuse Father   . Diabetes Brother   . Stroke Mother   . Alcohol abuse Brother   . Kidney disease Neg Hx   . Prostate cancer Neg Hx     Social History:  reports that he has never smoked. He has never used smokeless tobacco. He reports that he does not drink alcohol or use drugs.  ROS:                                          Physical Exam: BP 129/81   Pulse 79   Ht 5' 5.5" (1.664 m)   Wt 74.2 kg (163 lb 8 oz)   BMI 26.79 kg/m   Constitutional:  Alert and oriented, No acute distress.   Laboratory Data: Lab Results  Component Value Date   WBC 5.1 05/22/2015   HGB 14.8 05/22/2015   HCT  44.1 05/22/2015   MCV 95.2 05/22/2015   PLT 116 (L) 05/22/2015    Lab Results  Component Value Date   CREATININE 0.96 05/22/2015    No results found for: PSA  Lab Results  Component Value Date   TESTOSTERONE 436 06/27/2015    No results found for: HGBA1C  Urinalysis    Component Value Date/Time   APPEARANCEUR Cloudy (A) 04/11/2015 1455   GLUCOSEU Negative 04/11/2015 1455   BILIRUBINUR Negative 04/11/2015 1455   PROTEINUR Negative 04/11/2015 1455   NITRITE Negative 04/11/2015 1455   LEUKOCYTESUR Negative 04/11/2015 1455    Pertinent Imaging: none  Assessment & Plan:  The patient has ongoing nighttime frequency and daytime from sleep. I will see him in 2 months and prescribed DDAVP. I did review his medical comorbidities and he will need a serum sodium prior. He will call prior to the visit to make certain that the medication is prescribed mobile  There are no diagnoses linked to this encounter.  No Follow-up on file.  Reece Packer, MD  [facility] [address], [place], [place] [address] [phone]

## 2016-04-09 ENCOUNTER — Encounter (INDEPENDENT_AMBULATORY_CARE_PROVIDER_SITE_OTHER): Payer: Self-pay | Admitting: Vascular Surgery

## 2016-04-09 ENCOUNTER — Ambulatory Visit (INDEPENDENT_AMBULATORY_CARE_PROVIDER_SITE_OTHER): Payer: Medicare Other | Admitting: Vascular Surgery

## 2016-04-09 VITALS — BP 133/80 | HR 68 | Resp 16 | Ht 66.5 in | Wt 160.0 lb

## 2016-04-09 DIAGNOSIS — I872 Venous insufficiency (chronic) (peripheral): Secondary | ICD-10-CM

## 2016-04-09 DIAGNOSIS — M79604 Pain in right leg: Secondary | ICD-10-CM

## 2016-04-09 DIAGNOSIS — M79605 Pain in left leg: Secondary | ICD-10-CM

## 2016-04-09 DIAGNOSIS — I10 Essential (primary) hypertension: Secondary | ICD-10-CM | POA: Diagnosis not present

## 2016-04-09 DIAGNOSIS — K219 Gastro-esophageal reflux disease without esophagitis: Secondary | ICD-10-CM | POA: Diagnosis not present

## 2016-04-09 NOTE — Progress Notes (Signed)
MRN : [id number]  Dustin Turner is a 81 y.o. (02/03/1935) male who presents with chief complaint of  Chief Complaint  Patient presents with  . Follow-up  .  History of Present Illness:   Current Meds  Medication Sig  . alendronate (FOSAMAX) 70 MG tablet   . ammonium lactate (LAC-HYDRIN) 12 % lotion Apply topically.  . Ascorbic Acid (VITAMIN C) 1000 MG tablet Take 460 mg by mouth.  . Biotin (SUPER BIOTIN) 5 MG TABS 10,000 mg.  . calcium-vitamin D (CALCIUM 500/D) 500-200 MG-UNIT per tablet Take by mouth.  . Chlorzoxazone (LORZONE) 750 MG TABS Take by mouth. Reported on 04/11/2015  . Cholecalciferol (D 400) 400 units CHEW Chew by mouth. Reported on 04/11/2015  . clonazePAM (KLONOPIN) 0.5 MG tablet Take by mouth.  . cycloSPORINE (RESTASIS) 0.05 % ophthalmic emulsion   . diclofenac sodium (VOLTAREN) 1 % GEL Apply topically.  . donepezil (ARICEPT) 10 MG tablet 10 mg.  . DULoxetine (CYMBALTA) 30 MG capsule Take 30 mg by mouth daily.  [name] Kitchen. erythromycin ophthalmic ointment Reported on 05/10/2015  . Eszopiclone 3 MG TABS Reported on 04/11/2015  . fentaNYL (ACTIQ) 800 MCG lollipop Reported on 04/11/2015  . Fish Oil-Cholecalciferol (FISH OIL + D3) 1000-1000 MG-UNIT CAPS Take by mouth.  . gabapentin (NEURONTIN) 400 MG capsule Take 400 mg by mouth.  [name] Kitchen. HYDROmorphone, PF, (DILAUDID HP) 250 MG injection Inject 10 mg as directed continuously.   [name] Kitchen. ibuprofen (ADVIL,MOTRIN) 800 MG tablet Take 800 mg by mouth.  . Insulin Pen Needle (PEN NEEDLES 31GX5/16") 31G X 8 MM MISC 1 each by Does not apply route.  [name] Kitchen. L-Methylfolate-Algae-B12-B6 (METANX) 3-90.314-2-35 MG CAPS Reported on 04/11/2015  . LINZESS 290 MCG CAPS capsule   . magnesium hydroxide (MILK OF MAGNESIA) 400 MG/5ML suspension Take by mouth. Reported on 04/11/2015  . memantine (NAMENDA) 10 MG tablet Take by mouth.  . methylphenidate (RITALIN) 10 MG tablet Take by mouth.  . mexiletine (MEXITIL) 200 MG capsule Reported on 04/11/2015  . mirabegron ER  (MYRBETRIQ) 50 MG TB24 tablet Take 1 tablet (50 mg total) by mouth daily.  [name] Kitchen. MOVANTIK 12.5 MG TABS tablet   . naproxen (NAPROXEN DR) 500 MG EC tablet Take by mouth.  Jonelle Sports. NARCAN 4 MG/0.1ML LIQD Reported on 04/11/2015  . oxyCODONE (OXYCONTIN) 40 mg 12 hr tablet Take by mouth. Reported on 04/11/2015  . Testosterone 20.25 MG/ACT (1.62%) GEL Place onto the skin.  [name] Kitchen. triamcinolone cream (KENALOG) 0.1 % apply to affected area twice a day UNTIL IMPROVED  . vitamin B-12 (CYANOCOBALAMIN) 1000 MCG tablet Take by mouth.    Past Medical History:  Diagnosis Date  . Arthritis   . BPH (benign prostatic hyperplasia)   . DDD (degenerative disc disease), cervical   . Hypogonadism in male   . Insomnia   . Neurogenic bladder   . OP (osteoporosis)   . Scoliosis   . Spinal stenosis   . Urinary urgency   . Venous stasis     Past Surgical History:  Procedure Laterality Date  . CERVICAL SPINE SURGERY    . HERNIA REPAIR    . JOINT REPLACEMENT    . LUMBAR SPINE SURGERY    . Proleive System Treatment  2006  . ROTATOR CUFF REPAIR    . THORACIC SPINE SURGERY      Social History Social History  Substance Use Topics  . Smoking status: Never Smoker  . Smokeless tobacco: Never Used  . Alcohol use No    Family  History Family History  Problem Relation Age of Onset  . Diabetes Father   . Stroke Father   . Alcohol abuse Father   . Diabetes Brother   . Stroke Mother   . Alcohol abuse Brother   . Kidney disease Neg Hx   . Prostate cancer Neg Hx     Allergies  Allergen Reactions  . Amitriptyline   . Nortriptyline Other (See Comments)    GI  upset     REVIEW OF SYSTEMS (Negative unless checked)  Constitutional: [] Weight loss  [] Fever  [] Chills Cardiac: [] Chest pain   [] Chest pressure   [] Palpitations   [] Shortness of breath when laying flat   [] Shortness of breath with exertion. Vascular:  [] Pain in legs with walking   [] Pain in legs at rest  [] History of DVT   [] Phlebitis   [] Swelling in legs    [] Varicose veins   [] Non-healing ulcers Pulmonary:   [] Uses home oxygen   [] Productive cough   [] Hemoptysis   [] Wheeze  [] COPD   [] Asthma Neurologic:  [] Dizziness   [] Seizures   [] History of stroke   [] History of TIA  [] Aphasia   [] Vissual changes   [] Weakness or numbness in arm   [] Weakness or numbness in leg Musculoskeletal:   [] Joint swelling   [] Joint pain   [] Low back pain Hematologic:  [] Easy bruising  [] Easy bleeding   [] Hypercoagulable state   [] Anemic Gastrointestinal:  [] Diarrhea   [] Vomiting  [] Gastroesophageal reflux/heartburn   [] Difficulty swallowing. Genitourinary:  [] Chronic kidney disease   [] Difficult urination  [] Frequent urination   [] Blood in urine Skin:  [] Rashes   [] Ulcers  Psychological:  [] History of anxiety   []  History of major depression.  Physical Examination  Vitals:   04/09/16 1511  BP: 133/80  Pulse: 68  Resp: 16  Weight: 160 lb (72.6 kg)  Height: 5' 6.5" (1.689 m)   Body mass index is 25.44 kg/m. Gen: WD/WN, NAD Head: [place]/AT, No temporalis wasting.  Ear/Nose/Throat: Hearing grossly intact, nares w/o erythema or drainage, poor dentition Eyes: PER, EOMI, sclera nonicteric.  Neck: Supple, no masses.  No bruit or JVD.  Pulmonary:  Good air movement, clear to auscultation bilaterally, no use of accessory muscles.  Cardiac: RRR, normal S1, S2, no Murmurs. Vascular:  Vessel Right Left  Radial Palpable Palpable  Ulnar Palpable Palpable  Brachial Palpable Palpable  Carotid Palpable Palpable  Femoral Palpable Palpable  Popliteal Palpable Palpable  PT Palpable Palpable  DP Palpable Palpable   Gastrointestinal: soft, non-distended. No guarding/no peritoneal signs.  Musculoskeletal: M/S 5/5 throughout.  No deformity or atrophy.  Neurologic: CN 2-12 intact. Pain and light touch intact in extremities.  Symmetrical.  Speech is fluent. Motor exam as listed above. Psychiatric: Judgment intact, Mood & affect appropriate for pt's clinical  situation. Dermatologic: No rashes or ulcers noted.  No changes consistent with cellulitis. Lymph : No Cervical lymphadenopathy, no lichenification or skin changes of chronic lymphedema.  CBC Lab Results  Component Value Date   WBC 5.1 05/22/2015   HGB 14.8 05/22/2015   HCT 44.1 05/22/2015   MCV 95.2 05/22/2015   PLT 116 (L) 05/22/2015    BMET    Component Value Date/Time   NA 137 05/22/2015 1546   K 4.5 05/22/2015 1546   CL 101 05/22/2015 1546   CO2 26 05/22/2015 1546   GLUCOSE 101 (H) 05/22/2015 1546   BUN 23 (H) 05/22/2015 1546   CREATININE 0.96 05/22/2015 1546   CALCIUM 8.8 (L) 05/22/2015 1546   GFRNONAA >  60 05/22/2015 1546   GFRAA >60 05/22/2015 1546   CrCl cannot be calculated (Patient's most recent lab result is older than the maximum 21 days allowed.).  COAG No results found for: INR, PROTIME  Radiology No results found.  Outside Studies/Documentation  pages of outside documents were reviewed.  They showed .  Assessment/Plan 1. Chronic venous insufficiency   2. Pain in both lower extremities   3. Essential hypertension   4. Gastroesophageal reflux disease without esophagitis     Levora Dredge, MD  04/09/2016 8:02 PM

## 2016-04-18 ENCOUNTER — Ambulatory Visit: Payer: Medicare Other | Attending: Urology | Admitting: Physical Therapy

## 2016-04-18 DIAGNOSIS — R2689 Other abnormalities of gait and mobility: Secondary | ICD-10-CM | POA: Insufficient documentation

## 2016-04-18 DIAGNOSIS — R278 Other lack of coordination: Secondary | ICD-10-CM | POA: Diagnosis present

## 2016-04-18 DIAGNOSIS — R2681 Unsteadiness on feet: Secondary | ICD-10-CM | POA: Insufficient documentation

## 2016-04-18 DIAGNOSIS — Z9181 History of falling: Secondary | ICD-10-CM | POA: Diagnosis present

## 2016-04-19 NOTE — Therapy (Signed)
[place] MAIN [facility] SERVICES [place], [place], [address] Phone: [phone]   Fax:  [phone]  Physical Therapy Treatment / Progress Note  Patient Details  Name: Dustin Turner MRN: [id number] Date of Birth: [birth year] Referring Provider: Ernestine Conrad  Encounter Date: 04/18/2016      PT End of Session - 04/19/16 1052    Visit Number 20   Date for PT Re-Evaluation 07/02/16   Authorization Type 10/10   PT Start Time 7858   PT Stop Time 1555   PT Time Calculation (min) 49 min      Past Medical History:  Diagnosis Date  . Arthritis   . BPH (benign prostatic hyperplasia)   . DDD (degenerative disc disease), cervical   . Hypogonadism in male   . Insomnia   . Neurogenic bladder   . OP (osteoporosis)   . Scoliosis   . Spinal stenosis   . Urinary urgency   . Venous stasis     Past Surgical History:  Procedure Laterality Date  . CERVICAL SPINE SURGERY    . HERNIA REPAIR    . JOINT REPLACEMENT    . LUMBAR SPINE SURGERY    . Proleive System Treatment  2006  . ROTATOR CUFF REPAIR    . THORACIC SPINE SURGERY      There were no vitals filed for this visit.      Subjective Assessment - 04/18/16 1512    Subjective Pt continues to get up at night 3-6x night.    Pertinent History OCD, Neuropathy, scoliosis, pain pump located at R abdomen, Spinal Cord stimulator, 6 spinal surgeries, L  inguinal hernia 2/2 implantation of the pain pump.  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  Physical activity: resistance bands    Limitations Sitting;Standing;Walking   Patient Stated Goals get up less at night, sleep better,    Pain Onset More than a month ago            [facility] PT Assessment - 04/19/16 1039      Observation/Other Assessments   Observations more upright head position, more relaxed appearance                     [facility] Adult PT Treatment/Exercise - 04/19/16 1040      Neuro Re-ed     Neuro Re-ed Details  body scan technique to increase relaxation strategies                PT Education - 04/19/16 1052    Education provided Yes   Education Details HEP   Person(s) Educated Patient   Methods Explanation;Demonstration;Tactile cues;Verbal cues;Handout   Comprehension Returned demonstration;Verbalized understanding             PT Long Term Goals - 04/19/16 1053      PT LONG TERM GOAL #1   Title Pt will demo/ report IND with performing his tai chi practice without mm spasms nor loss of balance in order to return to his hobby and engage in a relaxing activity for overall health and pelvic health wellness    Time 12   Period Days   Status Achieved     PT LONG TERM GOAL #2   Title Pt will demo proper technique for kegel with lengthening of pelvic floor mm in order to initiate urine during day/night with less 5-10 min    Time 12   Period Weeks   Status Achieved  PT LONG TERM GOAL #3   Title Pt will be able to decrease his use of his urinal in car by 50% of the time in order to travel.    Period Weeks   Status Achieved     PT LONG TERM GOAL #4   Title Pt will decrease his score on NIH-CPSI from 49% to < 44% in order to demo improved ability to urinate will less difficulty.  ( 3/16: 23%)    Time 12   Status Achieved     PT LONG TERM GOAL #5   Title Pt will demo decreased PSQI from 71% to < 61% in order to demo improved QOL  (2/26: 43%)    Time 12   Period Weeks   Status Achieved     PT LONG TERM GOAL #6   Time 12     PT LONG TERM GOAL  #9   TITLE Pt will report decreased mm spasms in the evening after performing HEP across 2 days in order to be IND and compliance with repetitions and customized HEP for maintainence   Time 12   Period Weeks   Status Achieved     PT LONG TERM GOAL  #10   TITLE Pt will report decreased nocturia from 5-6 x/ night to 1-4x/ night across 2 weeks in order to improve sleep    Time 12   Period Weeks   Status  On-going               Plan - 04/19/16 1054    Clinical Impression Statement Pt has acheived 5/6 goals with improved  sleep, decreased urgency, and pelvic floor coordination and strength.  Addressing pt's remaining goal on decreasing nocturia epsiodes with urge  protocol and relaxation training. Pt continues to report no change on  nocturia frequency. Pt has been practicing urge protocol and wanted to  learn the body scan technique today to continue building on relaxation strategies. Pt  reported feeling more relaxed following training today. Pt has returned to  practicing tai chi with the modifications he has learned from PT and this  practice is also another relaxation strategy that is beneficial to pt.  Pt  will continue to practice his HEP to make changes towards his remaining  goal. Possible d/c at next session.        Rehab Potential Good   Clinical Impairments Affecting Rehab Potential scoliosis, multiple surgeries, psychological factors (anxiety, OCD), sleep disorder   PT Frequency [place]   PT Duration 12 weeks   PT Treatment/Interventions ADLs/Self Care Home Management;Aquatic Therapy;Moist Heat;Traction;Gait training;Stair training;Functional mobility training;Neuromuscular re-education;Balance training;Therapeutic activities;Patient/family education;Manual lymph drainage;Scar mobilization;Therapeutic exercise;Cryotherapy;Energy conservation;Other (comment)  relaxation techniques   Consulted and Agree with Plan of Care Patient      Patient will benefit from skilled therapeutic intervention in order to improve the following deficits and impairments:  Abnormal gait, Decreased activity tolerance, Decreased coordination, Decreased safety awareness, Decreased strength, Postural dysfunction, Improper body mechanics, Decreased scar mobility, Decreased range of motion, Decreased mobility, Decreased balance, Hypomobility, Pain  Visit Diagnosis: Other lack of  coordination  Unsteadiness on feet  History of falling  Other abnormalities of gait and mobility       G-Codes - 04-19-2016 1859    Self Care Current Status (Q7622) At least 20 percent but less than 40 percent impaired, limited or restricted   Self Care Goal Status (Q3335) At least 1 percent but less than 20 percent impaired, limited or restricted  Problem List Patient Active Problem List   Diagnosis Date Noted  . Chronic venous insufficiency 04/09/2016  . Urinary urgency 05/27/2015  . Hypogonadism in male 05/27/2015  . Urinary frequency 04/15/2015  . BPH with obstruction/lower urinary tract symptoms 04/15/2015  . Abdominal pain 07/23/2014  . Abnormal gait 07/23/2014  . Absolute anemia 07/23/2014  . Cognitive disorder 07/23/2014  . Narrowing of intervertebral disc space 07/23/2014  . Clinical depression 07/23/2014  . Acid reflux 07/23/2014  . Abdominal hernia 07/23/2014  . HLD (hyperlipidemia) 07/23/2014  . BP (high blood pressure) 07/23/2014  . Amnesia 07/23/2014  . 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 Scoliosis 07/23/2014  . Compressed spine fracture ([place]) 07/23/2014  . Spinal stenosis 07/23/2014  . Facial numbness 06/03/2014  . Spasm 04/09/2014  . Amnestic syndrome, drug-induced ([place]) 12/08/2013  . Obstructive apnea 12/08/2013  . Generalized OA 11/19/2013  . Bad memory 11/19/2013  . Leg pain 09/04/2013  . Polypharmacy 08/20/2013  . 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 Abnormal finding on thyroid function test 07/30/2013  . Daytime somnolence 07/30/2013  . Encounter for screening for lipoid disorders 07/30/2013  . OP (osteoporosis) 07/30/2013  . Chronic pain 06/29/2013  . Cannot sleep 06/29/2013  . Dorsopathy 06/29/2013  . Dermatitis, stasis 06/29/2013  . Blue color skin 06/05/2013  . Excessive falling 06/05/2013  . Able to mobilize using mobility aids 06/05/2013  . Curvature of spine 10/10/2012  . Acquired deformities of spine  10/10/2012  . 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 FOM (frequency of micturition) 08/21/2011  . Cervical spondylosis without myelopathy 05/03/2011  . Cervical osteoarthritis 05/03/2011  . DDD (degenerative disc disease), lumbosacral 05/03/2011  . Angulation of spine 05/03/2011  . LBP (low back pain) 05/03/2011  . Degenerative arthritis of lumbar spine 05/03/2011  . Lumbosacral spondylosis 05/03/2011  . Back pain, chronic 03/29/2011  . Benign fibroma of prostate 03/29/2011  . Urgency of micturation 03/29/2011  . Hernia, incisional 08/21/2010  . Exomphalos 08/21/2010  . Arthritis 08/07/2010  . CN (constipation) 08/07/2010  . Exposure to radiation 03/15/1953    Jerl Mina ,PT, DPT, E-RYT  04/19/2016, 10:59 AM  [place] MAIN [facility] SERVICES [address] [place], [place], [address] Phone: [phone]   Fax:  [phone]  Name: Dustin Turner MRN: [id number] Date of Birth: 06-23-1935

## 2016-04-19 NOTE — Patient Instructions (Signed)
Guided body scan 3 cycles and also emailed audio recordings

## 2016-05-21 ENCOUNTER — Ambulatory Visit: Payer: Medicare Other | Attending: Urology | Admitting: Physical Therapy

## 2016-05-21 DIAGNOSIS — R2681 Unsteadiness on feet: Secondary | ICD-10-CM | POA: Diagnosis present

## 2016-05-21 DIAGNOSIS — R2689 Other abnormalities of gait and mobility: Secondary | ICD-10-CM | POA: Diagnosis present

## 2016-05-21 DIAGNOSIS — R279 Unspecified lack of coordination: Secondary | ICD-10-CM | POA: Diagnosis not present

## 2016-05-21 DIAGNOSIS — Z9181 History of falling: Secondary | ICD-10-CM | POA: Insufficient documentation

## 2016-05-21 DIAGNOSIS — R278 Other lack of coordination: Secondary | ICD-10-CM | POA: Diagnosis not present

## 2016-05-21 NOTE — Patient Instructions (Addendum)
Stick with Bed exercises and band exercises  DO NOT OVER DO  LESS IS MORE  ____  Modify the exercise where you move the opposite arm overhead and extend the knee with a wedge under your back  10 reps each side   Press forearms in the bed, shoulder blades squeeze back Slide heel out and in on exhale   10 reps each side     Yellow band:   Knees bent Hold band in R hand, press fist into bed, wrap the band from outside of the shin to between legs, then pull with L hand WITH EXHALE  as you turn your head looking at the band  10 reps each each  __________________

## 2016-05-21 NOTE — Therapy (Signed)
[place] MAIN [facility] SERVICES [address] [place], [place], [address] Phone: [phone]   Fax:  [phone]  Physical Therapy Treatment  Patient Details  Name: Dustin Turner MRN: [id number] Date of Birth: Sep 22, 1935 Referring Provider: Barbaraann Boys, MD  Encounter Date: 05/21/2016      PT End of Session - 05/21/16 1526    Visit Number 21   Date for PT Re-Evaluation 07/02/16   Authorization Type 1/10   PT Start Time 1415   PT Stop Time 1505   PT Time Calculation (min) 50 min   Activity Tolerance Patient tolerated treatment well;No increased pain   Behavior During Therapy WFL for tasks assessed/performed      Past Medical History:  Diagnosis Date  . Arthritis   . BPH (benign prostatic hyperplasia)   . DDD (degenerative disc disease), cervical   . Hypogonadism in male   . Insomnia   . Neurogenic bladder   . OP (osteoporosis)   . Scoliosis   . Spinal stenosis   . Urinary urgency   . Venous stasis     Past Surgical History:  Procedure Laterality Date  . CERVICAL SPINE SURGERY    . HERNIA REPAIR    . JOINT REPLACEMENT    . LUMBAR SPINE SURGERY    . Proleive System Treatment  2006  . ROTATOR CUFF REPAIR    . THORACIC SPINE SURGERY      There were no vitals filed for this visit.      Subjective Assessment - 05/21/16 1422    Subjective Pt reports only gets up 1-2 x night for the past 3 weeks. Pt's caregivers have not let him get up at night. Pt reports he has not been sleeping because he gets myoclonic jerks in his whole body. Pt has been taking heavy medications which has helped him to sleep til 4:30-5:30pm.  Pt will be undergoing a sleep study on 06/01/16 to before understand more the myoclonic jerks.  Pt was able to practice his HEP last month but the past 2 weeks, pt has not been able to do his HEP due to mm spasms. Mm spasms are not set off by band exercises and bed exercises. Pt was performing them till he got tired.       Pertinent History OCD, Neuropathy, scoliosis, pain pump located at R abdomen, Spinal Cord stimulator, 6 spinal surgeries, L  inguinal hernia 2/2 implantation of the pain pump.  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  Physical activity: resistance bands    Limitations Sitting;Standing;Walking   Patient Stated Goals get up less at night, sleep better,    Pain Onset More than a month ago            [facility] PT Assessment - 05/21/16 1603      Observation/Other Assessments   Observations no mm spasms with use of wedge behind back during PNF band ex and previous HEP                     [facility] Adult PT Treatment/Exercise - 05/21/16 0001      Therapeutic Activites    Therapeutic Activities --  urge protocol     Neuro Re-ed    Neuro Re-ed Details  see pt instructions                PT Education - 05/21/16 1452    Education provided Yes   Education Details HEP   Person(s)  Educated Patient   Methods Explanation;Demonstration;Tactile cues;Verbal cues;Handout   Comprehension Returned demonstration;Verbalized understanding             PT Long Term Goals - 05/21/16 1602      PT LONG TERM GOAL #1   Title Pt will demo/ report IND with performing his tai chi practice without mm spasms nor loss of balance in order to return to his hobby and engage in a relaxing activity for overall health and pelvic health wellness    Time 12   Period Days   Status Achieved     PT LONG TERM GOAL #2   Title Pt will demo proper technique for kegel with lengthening of pelvic floor mm in order to initiate urine during day/night with less 5-10 min    Time 12   Period Weeks   Status Achieved     PT LONG TERM GOAL #3   Title Pt will be able to decrease his use of his urinal in car by 50% of the time in order to travel.    Period Weeks   Status Achieved     PT LONG TERM GOAL #4   Title Pt will decrease his score on NIH-CPSI from 49% to < 44% in order to demo  improved ability to urinate will less difficulty.  ( 3/16: 23%)    Time 12   Status Achieved     PT LONG TERM GOAL #5   Title Pt will demo decreased PSQI from 71% to < 61% in order to demo improved QOL  (2/26: 43%)    Time 12   Period Weeks   Status Achieved     PT LONG TERM GOAL #6   Title Pt will report minimal mm spasms with new HEP and was able to perform his HEP across 2 weeks 3x/ week in order to maintain strength and flexibility.    Time 12   Period Weeks   Status New     PT LONG TERM GOAL  #9   TITLE Pt will report decreased mm spasms in the evening after performing HEP across 2 days in order to be IND and compliance with repetitions and customized HEP for maintainence   Time 12   Period Weeks   Status Achieved     PT LONG TERM GOAL  #10   TITLE Pt will report decreased nocturia from 5-6 x/ night to 1-4x/ night across 2 weeks in order to improve sleep    Time 12   Period Weeks   Status On-going               Plan - 05/21/16 1528    Clinical Impression Statement Pt reports his trips to the toilet at night has decreased to 1-2x/ night with the help of his caregivers. Pt has had a relapse of mm spasms which has limited his ability to perform his HEP for the past 2 weeks.  Modified his HEP with use of a wedge to minimize trunk /hip angle as an attempt to minimize mm spasms. Pt did not have complaints with new HEP. Pt continues to benefit from skilled PT to find a HEP program that does not increase mm spasm. Pt again was educated to not over do his exercises which he voiced understanding.    Rehab Potential Good   Clinical Impairments Affecting Rehab Potential scoliosis, multiple surgeries, psychological factors (anxiety, OCD), sleep disorder   PT Frequency Monthy   PT Duration 12 weeks   PT Treatment/Interventions ADLs/Self  Care Home Management;Aquatic Therapy;Moist Heat;Traction;Gait training;Stair training;Functional mobility training;Neuromuscular  re-education;Balance training;Therapeutic activities;Patient/family education;Manual lymph drainage;Scar mobilization;Therapeutic exercise;Cryotherapy;Energy conservation;Other (comment)  relaxation techniques   Consulted and Agree with Plan of Care Patient      Patient will benefit from skilled therapeutic intervention in order to improve the following deficits and impairments:  Abnormal gait, Decreased activity tolerance, Decreased coordination, Decreased safety awareness, Decreased strength, Postural dysfunction, Improper body mechanics, Decreased scar mobility, Decreased range of motion, Decreased mobility, Decreased balance, Hypomobility, Pain  Visit Diagnosis: Other lack of coordination  Unsteadiness on feet  History of falling  Other abnormalities of gait and mobility     Problem List Patient Active Problem List   Diagnosis Date Noted  . Chronic venous insufficiency 04/09/2016  . Urinary urgency 05/27/2015  . Hypogonadism in male 05/27/2015  . Urinary frequency 04/15/2015  . BPH with obstruction/lower urinary tract symptoms 04/15/2015  . Abdominal pain 07/23/2014  . Abnormal gait 07/23/2014  . Absolute anemia 07/23/2014  . Cognitive disorder 07/23/2014  . Narrowing of intervertebral disc space 07/23/2014  . Clinical depression 07/23/2014  . Acid reflux 07/23/2014  . Abdominal hernia 07/23/2014  . HLD (hyperlipidemia) 07/23/2014  . BP (high blood pressure) 07/23/2014  . Amnesia 07/23/2014  . 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 Scoliosis 07/23/2014  . Compressed spine fracture ([place]) 07/23/2014  . Spinal stenosis 07/23/2014  . Facial numbness 06/03/2014  . Spasm 04/09/2014  . Amnestic syndrome, drug-induced ([place]) 12/08/2013  . Obstructive apnea 12/08/2013  . Generalized OA 11/19/2013  . Bad memory 11/19/2013  . Leg pain 09/04/2013  . Polypharmacy 08/20/2013  . 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 Abnormal finding on thyroid function test  07/30/2013  . Daytime somnolence 07/30/2013  . Encounter for screening for lipoid disorders 07/30/2013  . OP (osteoporosis) 07/30/2013  . Chronic pain 06/29/2013  . Cannot sleep 06/29/2013  . Dorsopathy 06/29/2013  . Dermatitis, stasis 06/29/2013  . Blue color skin 06/05/2013  . Excessive falling 06/05/2013  . Able to mobilize using mobility aids 06/05/2013  . Curvature of spine 10/10/2012  . Acquired deformities of spine 10/10/2012  . 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 FOM (frequency of micturition) 08/21/2011  . Cervical spondylosis without myelopathy 05/03/2011  . Cervical osteoarthritis 05/03/2011  . DDD (degenerative disc disease), lumbosacral 05/03/2011  . Angulation of spine 05/03/2011  . LBP (low back pain) 05/03/2011  . Degenerative arthritis of lumbar spine 05/03/2011  . Lumbosacral spondylosis 05/03/2011  . Back pain, chronic 03/29/2011  . Benign fibroma of prostate 03/29/2011  . Urgency of micturation 03/29/2011  . Hernia, incisional 08/21/2010  . Exomphalos 08/21/2010  . Arthritis 08/07/2010  . CN (constipation) 08/07/2010  . Exposure to radiation 03/15/1953    Jerl Mina ,PT, DPT, E-RYT  05/21/2016, 4:06 PM  [place] MAIN [facility] SERVICES [address] [place], [place], [address] Phone: [phone]   Fax:  [phone]  Name: Dustin Turner MRN: [id number] Date of Birth: Nov 19, 1935

## 2016-05-28 ENCOUNTER — Ambulatory Visit: Payer: Self-pay | Admitting: Urology

## 2016-05-29 ENCOUNTER — Other Ambulatory Visit: Payer: Self-pay | Admitting: Family Medicine

## 2016-05-29 ENCOUNTER — Telehealth: Payer: Self-pay | Admitting: Family Medicine

## 2016-05-29 DIAGNOSIS — R351 Nocturia: Secondary | ICD-10-CM

## 2016-05-29 NOTE — Telephone Encounter (Signed)
Appt made

## 2016-05-29 NOTE — Telephone Encounter (Signed)
Call patient and left a voice mail to make a lab appointment before his scheduled appointment with MacDiarmid. The lab order is in. BMP only

## 2016-06-18 ENCOUNTER — Ambulatory Visit: Payer: Medicare Other | Attending: Urology | Admitting: Physical Therapy

## 2016-06-18 DIAGNOSIS — R2689 Other abnormalities of gait and mobility: Secondary | ICD-10-CM

## 2016-06-18 DIAGNOSIS — Z9181 History of falling: Secondary | ICD-10-CM

## 2016-06-18 DIAGNOSIS — R2681 Unsteadiness on feet: Secondary | ICD-10-CM

## 2016-06-18 DIAGNOSIS — R278 Other lack of coordination: Secondary | ICD-10-CM | POA: Diagnosis present

## 2016-06-19 NOTE — Therapy (Signed)
[place] MAIN [facility] SERVICES [address] [place], [place], [address] Phone: [phone]   Fax:  [phone]  Physical Therapy Discharge Summary   Patient Details  Name: Dustin Turner MRN: [id number] Date of Birth: 19-Sep-1935 Referring Provider: Zara Council   Encounter Date: 06/18/2016      PT End of Session - 06/18/16 1522    Visit Number 22   Date for PT Re-Evaluation 08/06/16   Authorization Type 2/10   PT Start Time 1520   PT Stop Time 1540   PT Time Calculation (min) 20 min   Activity Tolerance Patient tolerated treatment well;No increased pain   Behavior During Therapy WFL for tasks assessed/performed      Past Medical History:  Diagnosis Date  . Arthritis   . BPH (benign prostatic hyperplasia)   . DDD (degenerative disc disease), cervical   . Hypogonadism in male   . Insomnia   . Neurogenic bladder   . OP (osteoporosis)   . Scoliosis   . Spinal stenosis   . Urinary urgency   . Venous stasis     Past Surgical History:  Procedure Laterality Date  . CERVICAL SPINE SURGERY    . HERNIA REPAIR    . JOINT REPLACEMENT    . LUMBAR SPINE SURGERY    . Proleive System Treatment  2006  . ROTATOR CUFF REPAIR    . THORACIC SPINE SURGERY      There were no vitals filed for this visit.      Subjective Assessment - 06/18/16 1522    Subjective Pt has been able to perform his most of his HEP but mm spasms still come on. Pt's myoclonic jerks in his hands and feet interrupt his sleep terribly bad. Neurologist and sleep specialists will be running one more test to help to differient between jerks versus seizures.  Pt 's nocturia has decreased  between 1-4 x/ night.     Pertinent History OCD, Neuropathy, scoliosis, pain pump located at R abdomen, Spinal Cord stimulator, 6 spinal surgeries, L  inguinal hernia 2/2 implantation of the pain pump.  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  Physical activity:  resistance bands    Limitations Sitting;Standing;Walking   Patient Stated Goals get up less at night, sleep better,    Pain Onset More than a month ago                         [facility] Adult PT Treatment/Exercise - 06/19/16 2254      Therapeutic Activites    Therapeutic Activities --  reassessed goals, discussed d/c, laminated HEP per ptrequest                PT Education - 06/18/16 1526    Education provided Yes   Person(s) Educated Patient   Methods Explanation;Demonstration;Tactile cues;Verbal cues   Comprehension Returned demonstration;Verbalized understanding             PT Long Term Goals - 06/18/16 1526      PT LONG TERM GOAL #1   Title Pt will demo/ report IND with performing his tai chi practice without mm spasms nor loss of balance in order to return to his hobby and engage in a relaxing activity for overall health and pelvic health wellness    Time 12   Period Days   Status Achieved     PT LONG TERM GOAL #2   Title Pt will demo  proper technique for kegel with lengthening of pelvic floor mm in order to initiate urine during day/night with less 5-10 min    Time 12   Period Weeks   Status Achieved     PT LONG TERM GOAL #3   Title Pt will be able to decrease his use of his urinal in car by 50% of the time in order to travel.    Period Weeks   Status Achieved     PT LONG TERM GOAL #4   Title Pt will decrease his score on NIH-CPSI from 49% to < 44% in order to demo improved ability to urinate will less difficulty.  ( 3/16: 23%)    Time 12   Status Achieved     PT LONG TERM GOAL #5   Title Pt will demo decreased PSQI from 71% to < 61% in order to demo improved QOL  (2/26: 43%)    Time 12   Period Weeks   Status Achieved     PT LONG TERM GOAL #6   Title Pt will report minimal mm spasms with new HEP and was able to perform his HEP across 2 weeks 3x/ week in order to maintain strength and flexibility.    Time 12   Period Weeks    Status Achieved     PT LONG TERM GOAL  #9   TITLE Pt will report decreased mm spasms in the evening after performing HEP across 2 days in order to be IND and compliance with repetitions and customized HEP for maintainence   Time 12   Period Weeks   Status Achieved     PT LONG TERM GOAL  #10   TITLE Pt will report decreased nocturia from 5-6 x/ night to 1-4x/ night across 2 weeks in order to improve sleep    Time 12   Period Weeks   Status On-going               Plan - 06/18/16 1526    Clinical Impression Statement Pt has achieved 100% of his goals. Pt has decreased his nocturia episodes from 3-6 x/ night to 1-3 x/ night and his urgency at night to 1-2 x.  Pt also has demonstrated proper coordination of his pelvic floor mm and has learned to perform kegel exercises correctly. Pt's sleep quality ( PSQI score) and pelvic floor function (NIH-CPSI score) have improved.  He has been able to perform his HEP with less mm spasms after modifications to accommodate for pt's severe scoliosis and his tendency to overdo this exercises. Pt has benefited greatly from an interdisciplinary team approach as he  worked with another PT with tai chi teaching experience in addition to a massage therapist who provided home visits to address his mm spasms. Pt is ready to be d/c.   Pt reports his remaining issues regarding interrupted sleep involve severe myoclonic jerks in his hands and feet. He is currently under the care of a neurologist to address this issue.    Thank you for this referral!      Rehab Potential Good   Clinical Impairments Affecting Rehab Potential scoliosis, multiple surgeries, psychological factors (anxiety, OCD), sleep disorder   PT Frequency Monthy   PT Duration 12 weeks   PT Treatment/Interventions ADLs/Self Care Home Management;Aquatic Therapy;Moist Heat;Traction;Gait training;Stair training;Functional mobility training;Neuromuscular re-education;Balance training;Therapeutic  activities;Patient/family education;Manual lymph drainage;Scar mobilization;Therapeutic exercise;Cryotherapy;Energy conservation;Other (comment)  relaxation techniques   Consulted and Agree with Plan of Care Patient      Patient will  benefit from skilled therapeutic intervention in order to improve the following deficits and impairments:  Abnormal gait, Decreased activity tolerance, Decreased coordination, Decreased safety awareness, Decreased strength, Postural dysfunction, Improper body mechanics, Decreased scar mobility, Decreased range of motion, Decreased mobility, Decreased balance, Hypomobility, Pain  Visit Diagnosis: Other lack of coordination  Unsteadiness on feet  History of falling  Other abnormalities of gait and mobility       G-Codes - 07/07/16 03-29-51    Functional Assessment Tool Used (Outpatient Only) NIH-CPSI 49% , PSQI 71% , clinical judgement   Functional Limitation Self care   Self Care Current Status (E1583) At least 20 percent but less than 40 percent impaired, limited or restricted   Self Care Goal Status (E9407) At least 20 percent but less than 40 percent impaired, limited or restricted   Self Care Discharge Status [phone]) At least 20 percent but less than 40 percent impaired, limited or restricted      Problem List Patient Active Problem List   Diagnosis Date Noted  . Chronic venous insufficiency 04/09/2016  . Urinary urgency 05/27/2015  . Hypogonadism in male 05/27/2015  . Urinary frequency 04/15/2015  . BPH with obstruction/lower urinary tract symptoms 04/15/2015  . Abdominal pain 07/23/2014  . Abnormal gait 07/23/2014  . Absolute anemia 07/23/2014  . Cognitive disorder 07/23/2014  . Narrowing of intervertebral disc space 07/23/2014  . Clinical depression 07/23/2014  . Acid reflux 07/23/2014  . Abdominal hernia 07/23/2014  . HLD (hyperlipidemia) 07/23/2014  . BP (high blood pressure) 07/23/2014  . Amnesia 07/23/2014  . Bladder neurogenesis  07/23/2014  . Scoliosis 07/23/2014  . Compressed spine fracture ([place]) 07/23/2014  . Spinal stenosis 07/23/2014  . Facial numbness 06/03/2014  . Spasm 04/09/2014  . Amnestic syndrome, drug-induced ([place]) 12/08/2013  . Obstructive apnea 12/08/2013  . Generalized OA 11/19/2013  . Bad memory 11/19/2013  . Leg pain 09/04/2013  . Polypharmacy 08/20/2013  . 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 Abnormal finding on thyroid function test 07/30/2013  . Daytime somnolence 07/30/2013  . Encounter for screening for lipoid disorders 07/30/2013  . OP (osteoporosis) 07/30/2013  . Chronic pain 06/29/2013  . Cannot sleep 06/29/2013  . Dorsopathy 06/29/2013  . Dermatitis, stasis 06/29/2013  . Blue color skin 06/05/2013  . Excessive falling 06/05/2013  . Able to mobilize using mobility aids 06/05/2013  . Curvature of spine 10/10/2012  . Acquired deformities of spine 10/10/2012  . 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 FOM (frequency of micturition) 08/21/2011  . Cervical spondylosis without myelopathy 05/03/2011  . Cervical osteoarthritis 05/03/2011  . DDD (degenerative disc disease), lumbosacral 05/03/2011  . Angulation of spine 05/03/2011  . LBP (low back pain) 05/03/2011  . Degenerative arthritis of lumbar spine 05/03/2011  . Lumbosacral spondylosis 05/03/2011  . Back pain, chronic 03/29/2011  . Benign fibroma of prostate 03/29/2011  . Urgency of micturation 03/29/2011  . Hernia, incisional 08/21/2010  . Exomphalos 08/21/2010  . Arthritis 08/07/2010  . CN (constipation) 08/07/2010  . Exposure to radiation 03/15/1953    Jerl Mina ,PT, DPT, E-RYT  07/07/2016, 10:55 PM  [place] MAIN [facility] SERVICES [address] [place], [place], [address] Phone: [phone]   Fax:  [phone]  Name: Justun Anaya MRN: [id number] Date of Birth: [birth year]

## 2016-06-27 ENCOUNTER — Other Ambulatory Visit: Payer: Medicare Other

## 2016-06-27 DIAGNOSIS — R351 Nocturia: Secondary | ICD-10-CM

## 2016-06-28 ENCOUNTER — Other Ambulatory Visit: Payer: Self-pay

## 2016-06-28 LAB — BASIC METABOLIC PANEL
BUN / CREAT RATIO: 23 (ref 10–24)
BUN: 22 mg/dL (ref 8–27)
CALCIUM: 9.2 mg/dL (ref 8.6–10.2)
CO2: 30 mmol/L — ABNORMAL HIGH (ref 20–29)
CREATININE: 0.97 mg/dL (ref 0.76–1.27)
Chloride: 98 mmol/L (ref 96–106)
GFR calc non Af Amer: 74 mL/min/{1.73_m2} (ref >59)
GFR, EST AFRICAN AMERICAN: 85 mL/min/{1.73_m2} (ref >59)
Glucose: 111 mg/dL — ABNORMAL HIGH (ref 65–99)
Potassium: 4.9 mmol/L (ref 3.5–5.2)
Sodium: 141 mmol/L (ref 134–144)

## 2016-07-02 ENCOUNTER — Encounter: Payer: Self-pay | Admitting: Urology

## 2016-07-02 ENCOUNTER — Ambulatory Visit (INDEPENDENT_AMBULATORY_CARE_PROVIDER_SITE_OTHER): Payer: Medicare Other | Admitting: Urology

## 2016-07-02 VITALS — BP 132/75 | HR 94 | Ht 66.0 in | Wt 160.0 lb

## 2016-07-02 DIAGNOSIS — R351 Nocturia: Secondary | ICD-10-CM | POA: Diagnosis not present

## 2016-07-02 NOTE — Progress Notes (Signed)
07/02/2016 2:52 PM   Dustin Turner 1935/03/11 [id number]  Referring provider: Barbaraann Boys, MD [place], [place] [address]  Chief Complaint  Patient presents with  . Nocturia    follow up    HPI: The patient is a 81 year old gentleman treated by Dr. Elnoria Howard for nighttime frequency. Physical therapy reduced his nighttime frequency from 3-6 to 1-4. He recently regressed back to 5 times. He wears ankle hose to reduce ankle edema. He has tried [employer] and perhaps other medication. He takes the beta 3 agonists 50 mg every second day. There was question whether or not it would cause retention when he was taking it every day. He had a thermotherapy prostate treatment in 2008.   He has had 6 back operations. He wears a brace for scoliosis.   The patient has minimal frequency and can void with a good flow every 3-4 hours. He has significant nighttime frequency. He has exhausted medication. The future role of DDAVP and current role of percutaneous tibial nerve stimulation was discussed. He was very well educated around the topic matter and I explained to him that I felt the Botox InterStim were not good options currently. He would like to try to beta 3 agonistdaily and be reassessed in one month.  He was interested in percutaneous tibial nerve stimulation   He failed the beta 3 agonist. He kept a diary. He wanted to be assess for potential DDAVP but he is willing to percutaneous tibial nerve stimulation in the future if needed.   Today  On June 13 the patient had a normal serum creatinine and his serum sodium was 141.   Frequency is stable. Nighttime frequency is affecting his quality of life and stable   PMH: Past Medical History:  Diagnosis Date  . Arthritis   . BPH (benign prostatic hyperplasia)   . DDD (degenerative disc disease), cervical   . Hypogonadism in male   . Insomnia   . Neurogenic bladder   . OP (osteoporosis)   . Scoliosis   . Spinal  stenosis   . Urinary urgency   . Venous stasis     Surgical History: Past Surgical History:  Procedure Laterality Date  . CERVICAL SPINE SURGERY    . HERNIA REPAIR    . JOINT REPLACEMENT    . LUMBAR SPINE SURGERY    . Proleive System Treatment  2006  . ROTATOR CUFF REPAIR    . THORACIC SPINE SURGERY      Home Medications:  Allergies as of 07/02/2016      Reactions   Amitriptyline    Nortriptyline Other (See Comments)   GI  upset      Medication List       Accurate as of 07/02/16  2:52 PM. Always use your most recent med list.          alendronate 70 MG tablet Commonly known as:  FOSAMAX   ammonium lactate 12 % lotion Commonly known as:  LAC-HYDRIN Apply topically.   ARICEPT 10 MG tablet Generic drug:  donepezil 10 mg.   CALCIUM 500/D 500-200 MG-UNIT tablet Generic drug:  calcium-vitamin D Take by mouth.   ciprofloxacin 0.3 % ophthalmic solution Commonly known as:  CILOXAN Reported on 05/10/2015   clonazePAM 0.5 MG tablet Commonly known as:  KLONOPIN Take by mouth.   D 400 400 units Chew Generic drug:  Cholecalciferol Chew by mouth. Reported on 04/11/2015   DULoxetine 30 MG capsule Commonly known as:  CYMBALTA Take 30 mg by mouth daily.   erythromycin ophthalmic ointment Reported on 05/10/2015   Eszopiclone 3 MG Tabs Reported on 04/11/2015   fentaNYL 800 MCG lollipop Commonly known as:  ACTIQ Reported on 04/11/2015   FISH OIL + D3 1000-1000 MG-UNIT Caps Take by mouth.   gabapentin 400 MG capsule Commonly known as:  NEURONTIN Take 400 mg by mouth.   HYDROmorphone (PF) 250 MG injection Commonly known as:  DILAUDID HP Inject 10 mg as directed continuously.   ibuprofen 800 MG tablet Commonly known as:  ADVIL,MOTRIN Take 800 mg by mouth.   LINZESS 290 MCG Caps capsule Generic drug:  linaclotide   LORZONE 750 MG Tabs Generic drug:  Chlorzoxazone Take by mouth. Reported on 04/11/2015   magnesium hydroxide 400 MG/5ML suspension Commonly  known as:  MILK OF MAGNESIA Take by mouth. Reported on 04/11/2015   memantine 10 MG tablet Commonly known as:  NAMENDA Take by mouth.   METANX 3-90.314-2-35 MG Caps Reported on 04/11/2015   methylphenidate 10 MG tablet Commonly known as:  RITALIN Take by mouth.   mexiletine 200 MG capsule Commonly known as:  MEXITIL Reported on 04/11/2015   mirabegron ER 50 MG Tb24 tablet Commonly known as:  MYRBETRIQ Take 1 tablet (50 mg total) by mouth daily.   MOVANTIK 12.5 MG Tabs tablet Generic drug:  naloxegol oxalate   NAPROXEN DR 500 MG EC tablet Generic drug:  naproxen Take by mouth.   NARCAN 4 MG/0.1ML Liqd nasal spray kit Generic drug:  naloxone Reported on 04/11/2015   oxyCODONE 40 mg 12 hr tablet Commonly known as:  OXYCONTIN Take by mouth. Reported on 04/11/2015   PEN NEEDLES 31GX5/16" 31G X 8 MM Misc 1 each by Does not apply route.   RESTASIS 0.05 % ophthalmic emulsion Generic drug:  cycloSPORINE   SUPER BIOTIN 5 MG Tabs Generic drug:  Biotin 10,000 mg.   Testosterone 20.25 MG/ACT (1.62%) Gel Place onto the skin.   triamcinolone cream 0.1 % Commonly known as:  KENALOG apply to affected area twice a day UNTIL IMPROVED   vitamin B-12 1000 MCG tablet Commonly known as:  CYANOCOBALAMIN Take by mouth.   vitamin C 1000 MG tablet Take 460 mg by mouth.   VOLTAREN 1 % Gel Generic drug:  diclofenac sodium Apply topically.       Allergies:  Allergies  Allergen Reactions  . Amitriptyline   . Nortriptyline Other (See Comments)    GI  upset    Family History: Family History  Problem Relation Age of Onset  . Diabetes Father   . Stroke Father   . Alcohol abuse Father   . Diabetes Brother   . Stroke Mother   . Alcohol abuse Brother   . Kidney disease Neg Hx   . Prostate cancer Neg Hx     Social History:  reports that he has never smoked. He has never used smokeless tobacco. He reports that he does not drink alcohol or use drugs.  ROS:                                         Physical Exam: There were no vitals taken for this visit.  Constitutional:  Alert and oriented, No acute distress.   Laboratory Data: Lab Results  Component Value Date   WBC 5.1 05/22/2015   HGB 14.8 05/22/2015   HCT 44.1 05/22/2015  MCV 95.2 05/22/2015   PLT 116 (L) 05/22/2015    Lab Results  Component Value Date   CREATININE 0.97 06/27/2016    No results found for: PSA  Lab Results  Component Value Date   TESTOSTERONE 436 06/27/2015    No results found for: HGBA1C  Urinalysis    Component Value Date/Time   APPEARANCEUR Cloudy (A) 04/11/2015 1455   GLUCOSEU Negative 04/11/2015 1455   BILIRUBINUR Negative 04/11/2015 1455   PROTEINUR Negative 04/11/2015 1455   NITRITE Negative 04/11/2015 1455   LEUKOCYTESUR Negative 04/11/2015 1455    Pertinent Imaging: none  Assessment & Plan:  The patient was prescribed DDAVP with pros cons and risks discussed. Contraindications were discussed including block box warning. Sodium protocol was discussed. I will reassess clinically in 4-6 weeks. Serum sodium will be drawn in 1 week again. Symptoms of hyponatremia discussed. Increased risk as an elderly male was discussed. Hopefully the treatment will help the patient's quality of life  There are no diagnoses linked to this encounter.  No Follow-up on file.  Reece Packer, MD  [facility] [address] [place], [place] [address] [phone]

## 2016-07-04 ENCOUNTER — Other Ambulatory Visit: Payer: Self-pay

## 2016-07-04 MED ORDER — DESMOPRESSIN ACETATE 0.83 MCG/0.1ML NA EMUL: 0.8300 ug | Freq: Once | NASAL | 11 refills | Status: AC

## 2016-07-11 ENCOUNTER — Other Ambulatory Visit: Payer: Self-pay

## 2016-07-11 MED ORDER — DESMOPRESSIN ACETATE 0.83 MCG/0.1ML NA EMUL: 0.8300 ug | Freq: Once | NASAL | 11 refills | Status: AC

## 2016-07-11 NOTE — Telephone Encounter (Signed)
Patient called in earlier to cancel lab appt b/c never received [facility]. Resent Rx today. Called pt to explain. No answer.

## 2016-07-12 ENCOUNTER — Other Ambulatory Visit: Payer: Self-pay

## 2016-07-13 ENCOUNTER — Telehealth: Payer: Self-pay

## 2016-07-13 NOTE — Telephone Encounter (Signed)
Called pt to get updated Rx insurance carrier phone info. Pt provided info. [employer]. [place] approval for Noctiva granted through 01/12/2017.  [facility] be delivered today.

## 2016-07-20 ENCOUNTER — Other Ambulatory Visit: Payer: Medicare Other

## 2016-07-20 DIAGNOSIS — R351 Nocturia: Secondary | ICD-10-CM

## 2016-07-21 LAB — SODIUM: Sodium: 135 mmol/L (ref 134–144)

## 2016-07-24 ENCOUNTER — Other Ambulatory Visit: Payer: Self-pay

## 2016-07-31 DIAGNOSIS — R569 Unspecified convulsions: Secondary | ICD-10-CM

## 2016-07-31 HISTORY — DX: Unspecified convulsions: R56.9

## 2016-08-06 ENCOUNTER — Ambulatory Visit (INDEPENDENT_AMBULATORY_CARE_PROVIDER_SITE_OTHER): Payer: Medicare Other | Admitting: Urology

## 2016-08-06 ENCOUNTER — Encounter: Payer: Self-pay | Admitting: Urology

## 2016-08-06 VITALS — BP 120/57 | HR 83 | Ht 66.0 in | Wt 168.5 lb

## 2016-08-06 DIAGNOSIS — R350 Frequency of micturition: Secondary | ICD-10-CM

## 2016-08-06 DIAGNOSIS — R351 Nocturia: Secondary | ICD-10-CM | POA: Diagnosis not present

## 2016-08-06 LAB — BLADDER SCAN AMB NON-IMAGING: SCAN RESULT: 135

## 2016-08-06 MED ORDER — MIRABEGRON ER 50 MG PO TB24: 50.0000 mg | ORAL_TABLET | Freq: Every day | ORAL | 3 refills | Status: DC

## 2016-08-06 NOTE — Progress Notes (Signed)
08/06/2016 3:35 PM   Dustin Turner 08/09/1935 [id number]  Referring provider: Barbaraann Boys, MD [place], [place] [address]  Chief Complaint  Patient presents with  . Nocturia    HPI: The patient is a 81 year old gentleman treated by Dr. Elnoria Howard for nighttime frequency. Physical therapy reduced his nighttime frequency from 3-6 to 1-4. He recently regressed back to 5 times. He wears ankle hose to reduce ankle edema. He has tried [employer] and perhaps other medication. He takes the beta 3 agonists 50 mg every second day. There was question whether or not it would cause retention when he was taking it every day. He had a thermotherapy prostate treatment in 2008.   He has had 6 back operations. He wears a brace for scoliosis.   The future role of DDAVP and current role of percutaneous tibial nerve stimulation was discussed. He was very well educated around the topic matter and I explained to him that I felt the Botox InterStim were not good options currently. He would like to try to beta 3 agonistdaily and be reassessed in one month.  He was interested in percutaneous tibial nerve stimulation   Today Patient still goes frequently on the beta 3 agonist once a day. He only gets up 0-1 time a night and said a 3 or 4 days very pleased. 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 Clinically noninfected.   PMH: Past Medical History:  Diagnosis Date  . Arthritis   . BPH (benign prostatic hyperplasia)   . DDD (degenerative disc disease), cervical   . Hypogonadism in male   . Insomnia   . Neurogenic bladder   . OP (osteoporosis)   . Scoliosis   . Spinal stenosis   . Urinary urgency   . Venous stasis     Surgical History: Past Surgical History:  Procedure Laterality Date  . CERVICAL SPINE SURGERY    . HERNIA REPAIR    . JOINT REPLACEMENT    . LUMBAR SPINE SURGERY    . Proleive System Treatment  2006  . ROTATOR CUFF REPAIR    . THORACIC SPINE SURGERY      Home  Medications:  Allergies as of 08/06/2016      Reactions   Amitriptyline    Nortriptyline Other (See Comments)   GI  upset      Medication List       Accurate as of 08/06/16  3:35 PM. Always use your most recent med list.          alendronate 70 MG tablet Commonly known as:  FOSAMAX   ammonium lactate 12 % lotion Commonly known as:  LAC-HYDRIN Apply topically.   ARICEPT 10 MG tablet Generic drug:  donepezil 10 mg.   CALCIUM 500/D 500-200 MG-UNIT tablet Generic drug:  calcium-vitamin D Take by mouth.   clonazePAM 0.5 MG tablet Commonly known as:  KLONOPIN Take by mouth.   D 400 400 units Chew Generic drug:  Cholecalciferol Chew by mouth. Reported on 04/11/2015   DULoxetine 30 MG capsule Commonly known as:  CYMBALTA Take 30 mg by mouth daily.   erythromycin ophthalmic ointment Reported on 05/10/2015   Eszopiclone 3 MG Tabs Reported on 04/11/2015   fentaNYL 800 MCG lollipop Commonly known as:  ACTIQ Reported on 04/11/2015   FISH OIL + D3 1000-1000 MG-UNIT Caps Take by mouth.   gabapentin 400 MG capsule Commonly known as:  NEURONTIN Take 400 mg by mouth.   HYDROmorphone (PF) 250 MG injection  Commonly known as:  DILAUDID HP Inject 10 mg as directed continuously.   ibuprofen 800 MG tablet Commonly known as:  ADVIL,MOTRIN Take 800 mg by mouth.   LINZESS 290 MCG Caps capsule Generic drug:  linaclotide   LORZONE 750 MG Tabs Generic drug:  Chlorzoxazone Take by mouth. Reported on 04/11/2015   magnesium hydroxide 400 MG/5ML suspension Commonly known as:  MILK OF MAGNESIA Take by mouth. Reported on 04/11/2015   memantine 10 MG tablet Commonly known as:  NAMENDA Take by mouth.   METANX 3-90.314-2-35 MG Caps Reported on 04/11/2015   methylphenidate 10 MG tablet Commonly known as:  RITALIN Take by mouth.   mexiletine 200 MG capsule Commonly known as:  MEXITIL Reported on 04/11/2015   mirabegron ER 50 MG Tb24 tablet Commonly known as:   MYRBETRIQ Take 1 tablet (50 mg total) by mouth daily.   MOVANTIK 12.5 MG Tabs tablet Generic drug:  naloxegol oxalate   NAPROXEN DR 500 MG EC tablet Generic drug:  naproxen Take by mouth.   NARCAN 4 MG/0.1ML Liqd nasal spray kit Generic drug:  naloxone Reported on 04/11/2015   oxyCODONE 40 mg 12 hr tablet Commonly known as:  OXYCONTIN Take by mouth. Reported on 04/11/2015   PEN NEEDLES 31GX5/16" 31G X 8 MM Misc 1 each by Does not apply route.   RESTASIS 0.05 % ophthalmic emulsion Generic drug:  cycloSPORINE   SUPER BIOTIN 5 MG Tabs Generic drug:  Biotin 10,000 mg.   Testosterone 20.25 MG/ACT (1.62%) Gel Place onto the skin.   triamcinolone cream 0.1 % Commonly known as:  KENALOG apply to affected area twice a day UNTIL IMPROVED   vitamin B-12 1000 MCG tablet Commonly known as:  CYANOCOBALAMIN Take by mouth.   vitamin C 1000 MG tablet Take 460 mg by mouth.   VOLTAREN 1 % Gel Generic drug:  diclofenac sodium Apply topically.       Allergies:  Allergies  Allergen Reactions  . Amitriptyline   . Nortriptyline Other (See Comments)    GI  upset    Family History: Family History  Problem Relation Age of Onset  . Diabetes Father   . Stroke Father   . Alcohol abuse Father   . Diabetes Brother   . Stroke Mother   . Alcohol abuse Brother   . Kidney disease Neg Hx   . Prostate cancer Neg Hx     Social History:  reports that he has never smoked. He has never used smokeless tobacco. He reports that he does not drink alcohol or use drugs.  ROS:                                        Physical Exam: There were no vitals taken for this visit.  Constitutional:  Alert and oriented, No acute distress.   Laboratory Data: Lab Results  Component Value Date   WBC 5.1 05/22/2015   HGB 14.8 05/22/2015   HCT 44.1 05/22/2015   MCV 95.2 05/22/2015   PLT 116 (L) 05/22/2015    Lab Results  Component Value Date   CREATININE 0.97  06/27/2016    No results found for: PSA  Lab Results  Component Value Date   TESTOSTERONE 436 06/27/2015    No results found for: HGBA1C  Urinalysis    Component Value Date/Time   APPEARANCEUR Cloudy (A) 04/11/2015 1455   GLUCOSEU Negative 04/11/2015 1455  BILIRUBINUR Negative 04/11/2015 1455   PROTEINUR Negative 04/11/2015 1455   NITRITE Negative 04/11/2015 1455   LEUKOCYTESUR Negative 04/11/2015 1455    Pertinent Imaging: none  Assessment & Plan:  I am very pleased for the patient. 3 month supply of beta 3 agonists with 3 refills sent. I will order a serum sodium. I will reassess in 4 months  There are no diagnoses linked to this encounter.  No Follow-up on file.  Reece Packer, MD  [facility] [address], [place], [place] [address] [phone]

## 2016-08-07 ENCOUNTER — Telehealth: Payer: Self-pay

## 2016-08-07 DIAGNOSIS — Z79899 Other long term (current) drug therapy: Secondary | ICD-10-CM

## 2016-08-07 LAB — SODIUM: SODIUM: 132 mmol/L — AB (ref 134–144)

## 2016-08-07 NOTE — Telephone Encounter (Signed)
Alfredo MartinezMacDiarmid, Scott, MD  Ellin GoodieLowe, Suzie Vandam S, CMA         Please let patient know that Na is OK but has come down a bit  Recommend to repeat if it in two weeks- please order and concern getting this message   Called patient. No answer. Will try later. Placed lab order.

## 2016-08-08 NOTE — Telephone Encounter (Signed)
Spoke to patient. Gave results. Patient verbalized understanding. Scheduled lab visit.

## 2016-08-21 ENCOUNTER — Other Ambulatory Visit: Payer: Medicare Other

## 2016-08-21 DIAGNOSIS — Z79899 Other long term (current) drug therapy: Secondary | ICD-10-CM

## 2016-08-22 LAB — SODIUM: Sodium: 138 mmol/L (ref 134–144)

## 2016-08-31 DIAGNOSIS — M25561 Pain in right knee: Secondary | ICD-10-CM | POA: Insufficient documentation

## 2016-09-05 ENCOUNTER — Telehealth: Payer: Self-pay | Admitting: Urology

## 2016-09-05 DIAGNOSIS — R350 Frequency of micturition: Secondary | ICD-10-CM

## 2016-09-05 NOTE — Telephone Encounter (Signed)
Pharmacy called asking for a new Rx to be sent. Please advise.  [phone]

## 2016-09-06 MED ORDER — MIRABEGRON ER 50 MG PO TB24: 50.0000 mg | ORAL_TABLET | Freq: Every day | ORAL | 3 refills | Status: DC

## 2016-09-06 NOTE — Telephone Encounter (Signed)
Medication was called into [facility] for pt. Pt made aware.

## 2016-09-27 DIAGNOSIS — E538 Deficiency of other specified B group vitamins: Secondary | ICD-10-CM

## 2016-09-27 HISTORY — DX: Deficiency of other specified B group vitamins: E53.8

## 2016-10-17 ENCOUNTER — Other Ambulatory Visit: Payer: Self-pay | Admitting: Neurology

## 2016-10-17 DIAGNOSIS — G40909 Epilepsy, unspecified, not intractable, without status epilepticus: Secondary | ICD-10-CM

## 2016-10-24 ENCOUNTER — Ambulatory Visit
Admission: RE | Admit: 2016-10-24 | Discharge: 2016-10-24 | Disposition: A | Discharge status: Home / Self Care | Payer: Medicare Other | Source: Ambulatory Visit | Attending: Neurology | Admitting: Neurology

## 2016-10-24 DIAGNOSIS — R930 Abnormal findings on diagnostic imaging of skull and head, not elsewhere classified: Secondary | ICD-10-CM | POA: Diagnosis not present

## 2016-10-24 DIAGNOSIS — G40909 Epilepsy, unspecified, not intractable, without status epilepticus: Secondary | ICD-10-CM | POA: Insufficient documentation

## 2016-10-24 MED ORDER — IOPAMIDOL (ISOVUE-300) INJECTION 61%
75.0000 mL | Freq: Once | INTRAVENOUS | Status: AC | PRN
  Administered 2016-10-24: 75 mL via INTRAVENOUS

## 2016-12-10 ENCOUNTER — Ambulatory Visit: Payer: Self-pay | Admitting: Urology

## 2016-12-10 ENCOUNTER — Other Ambulatory Visit: Payer: Self-pay

## 2016-12-10 ENCOUNTER — Telehealth: Payer: Self-pay

## 2016-12-10 MED ORDER — DESMOPRESSIN ACETATE 0.83 MCG/0.1ML NA EMUL: 1.0000 | Freq: Every day | NASAL | 3 refills | Status: DC

## 2016-12-10 NOTE — Telephone Encounter (Signed)
Pt called to state he was not able to come to f/u appt with Dr. Sherron MondayMacDiarmid. Pt requested a refill of noctiva. Per Dr. Sherron MondayMacDiarmid pt can have refills and needs to f/u in 2 months. Attempted to call pt back and make aware of f/u. LMOM

## 2016-12-12 NOTE — Telephone Encounter (Signed)
LMOM

## 2016-12-17 NOTE — Telephone Encounter (Signed)
Will send a letter

## 2017-01-01 ENCOUNTER — Inpatient Hospital Stay: Payer: Medicare Other

## 2017-01-01 ENCOUNTER — Other Ambulatory Visit: Payer: Self-pay

## 2017-01-01 ENCOUNTER — Inpatient Hospital Stay
Admission: EM | Admit: 2017-01-01 | Discharge: 2017-01-04 | DRG: 064 | Disposition: A | Discharge status: Other Acute Inpatient Hospital | Payer: Medicare Other | Attending: Specialist | Admitting: Specialist

## 2017-01-01 ENCOUNTER — Emergency Department: Payer: Medicare Other

## 2017-01-01 ENCOUNTER — Encounter: Payer: Self-pay | Admitting: Emergency Medicine

## 2017-01-01 DIAGNOSIS — Z79899 Other long term (current) drug therapy: Secondary | ICD-10-CM | POA: Diagnosis not present

## 2017-01-01 DIAGNOSIS — I499 Cardiac arrhythmia, unspecified: Secondary | ICD-10-CM | POA: Diagnosis not present

## 2017-01-01 DIAGNOSIS — G2581 Restless legs syndrome: Secondary | ICD-10-CM | POA: Diagnosis present

## 2017-01-01 DIAGNOSIS — G894 Chronic pain syndrome: Secondary | ICD-10-CM | POA: Diagnosis present

## 2017-01-01 DIAGNOSIS — Z66 Do not resuscitate: Secondary | ICD-10-CM | POA: Diagnosis present

## 2017-01-01 DIAGNOSIS — G9340 Encephalopathy, unspecified: Secondary | ICD-10-CM

## 2017-01-01 DIAGNOSIS — L03116 Cellulitis of left lower limb: Secondary | ICD-10-CM | POA: Diagnosis present

## 2017-01-01 DIAGNOSIS — M419 Scoliosis, unspecified: Secondary | ICD-10-CM | POA: Diagnosis present

## 2017-01-01 DIAGNOSIS — G060 Intracranial abscess and granuloma: Secondary | ICD-10-CM | POA: Diagnosis present

## 2017-01-01 DIAGNOSIS — Z794 Long term (current) use of insulin: Secondary | ICD-10-CM

## 2017-01-01 DIAGNOSIS — Z7983 Long term (current) use of bisphosphonates: Secondary | ICD-10-CM

## 2017-01-01 DIAGNOSIS — F419 Anxiety disorder, unspecified: Secondary | ICD-10-CM | POA: Diagnosis present

## 2017-01-01 DIAGNOSIS — M4800 Spinal stenosis, site unspecified: Secondary | ICD-10-CM | POA: Diagnosis present

## 2017-01-01 DIAGNOSIS — G253 Myoclonus: Secondary | ICD-10-CM | POA: Diagnosis present

## 2017-01-01 DIAGNOSIS — M810 Age-related osteoporosis without current pathological fracture: Secondary | ICD-10-CM | POA: Diagnosis present

## 2017-01-01 DIAGNOSIS — G9341 Metabolic encephalopathy: Secondary | ICD-10-CM | POA: Diagnosis present

## 2017-01-01 DIAGNOSIS — I639 Cerebral infarction, unspecified: Secondary | ICD-10-CM | POA: Diagnosis present

## 2017-01-01 DIAGNOSIS — G4700 Insomnia, unspecified: Secondary | ICD-10-CM | POA: Diagnosis present

## 2017-01-01 DIAGNOSIS — H7010 Chronic mastoiditis, unspecified ear: Secondary | ICD-10-CM | POA: Diagnosis present

## 2017-01-01 DIAGNOSIS — M549 Dorsalgia, unspecified: Secondary | ICD-10-CM | POA: Diagnosis present

## 2017-01-01 DIAGNOSIS — F0390 Unspecified dementia without behavioral disturbance: Secondary | ICD-10-CM | POA: Diagnosis present

## 2017-01-01 DIAGNOSIS — Z888 Allergy status to other drugs, medicaments and biological substances status: Secondary | ICD-10-CM

## 2017-01-01 DIAGNOSIS — I361 Nonrheumatic tricuspid (valve) insufficiency: Secondary | ICD-10-CM | POA: Diagnosis not present

## 2017-01-01 LAB — CBC
HCT: 40.1 % (ref 40.0–52.0)
Hemoglobin: 13.3 g/dL (ref 13.0–18.0)
MCH: 31.1 pg (ref 26.0–34.0)
MCHC: 33 g/dL (ref 32.0–36.0)
MCV: 94 fL (ref 80.0–100.0)
PLATELETS: 262 10*3/uL (ref 150–440)
RBC: 4.27 MIL/uL — AB (ref 4.40–5.90)
RDW: 13.5 % (ref 11.5–14.5)
WBC: 9.5 10*3/uL (ref 3.8–10.6)

## 2017-01-01 LAB — COMPREHENSIVE METABOLIC PANEL
ALBUMIN: 3.1 g/dL — AB (ref 3.5–5.0)
ALT: 35 U/L (ref 17–63)
AST: 38 U/L (ref 15–41)
Alkaline Phosphatase: 110 U/L (ref 38–126)
Anion gap: 12 (ref 5–15)
BILIRUBIN TOTAL: 0.5 mg/dL (ref 0.3–1.2)
BUN: 21 mg/dL — ABNORMAL HIGH (ref 6–20)
CALCIUM: 9.2 mg/dL (ref 8.9–10.3)
CHLORIDE: 87 mmol/L — AB (ref 101–111)
CO2: 31 mmol/L (ref 22–32)
CREATININE: 0.8 mg/dL (ref 0.61–1.24)
GFR calc non Af Amer: >60 mL/min (ref >60)
GLUCOSE: 165 mg/dL — AB (ref 65–99)
Potassium: 4.2 mmol/L (ref 3.5–5.1)
Sodium: 130 mmol/L — ABNORMAL LOW (ref 135–145)
Total Protein: 6.9 g/dL (ref 6.5–8.1)

## 2017-01-01 LAB — URINALYSIS, COMPLETE (UACMP) WITH MICROSCOPIC
Bacteria, UA: NONE SEEN
Bilirubin Urine: NEGATIVE
GLUCOSE, UA: NEGATIVE mg/dL
Hgb urine dipstick: NEGATIVE
Ketones, ur: NEGATIVE mg/dL
Leukocytes, UA: NEGATIVE
Nitrite: NEGATIVE
PROTEIN: NEGATIVE mg/dL
SPECIFIC GRAVITY, URINE: 1.01 (ref 1.005–1.030)
pH: 6 (ref 5.0–8.0)

## 2017-01-01 MED ORDER — ASPIRIN EC 81 MG PO TBEC
81.0000 mg | DELAYED_RELEASE_TABLET | Freq: Every day | ORAL | Status: DC
  Administered 2017-01-02: 81 mg via ORAL
  Filled 2017-01-01: qty 1

## 2017-01-01 MED ORDER — ONDANSETRON HCL 4 MG PO TABS: 4.0000 mg | ORAL_TABLET | Freq: Four times a day (QID) | ORAL | Status: DC | PRN

## 2017-01-01 MED ORDER — OFLOXACIN 0.3 % OP SOLN
5.0000 [drp] | Freq: Every day | OPHTHALMIC | Status: DC
  Administered 2017-01-02: 11:00:00 5 [drp] via OTIC
  Filled 2017-01-01: qty 5

## 2017-01-01 MED ORDER — ACETAMINOPHEN 160 MG/5ML PO SOLN
650.0000 mg | ORAL | Status: DC | PRN
  Filled 2017-01-01: qty 20.3

## 2017-01-01 MED ORDER — TESTOSTERONE 20.25 MG/ACT (1.62%) TD GEL: 1.0000 | Freq: Every day | TRANSDERMAL | Status: DC

## 2017-01-01 MED ORDER — ADULT MULTIVITAMIN W/MINERALS CH
1.0000 | ORAL_TABLET | Freq: Every day | ORAL | Status: DC
  Administered 2017-01-02: 11:00:00 1 via ORAL
  Filled 2017-01-01 (×2): qty 1

## 2017-01-01 MED ORDER — DULOXETINE HCL 30 MG PO CPEP
30.0000 mg | ORAL_CAPSULE | Freq: Every day | ORAL | Status: DC
  Administered 2017-01-02: 30 mg via ORAL
  Filled 2017-01-01 (×2): qty 1

## 2017-01-01 MED ORDER — SENNOSIDES-DOCUSATE SODIUM 8.6-50 MG PO TABS
1.0000 | ORAL_TABLET | Freq: Two times a day (BID) | ORAL | Status: DC
  Administered 2017-01-02 (×2): 1 via ORAL
  Filled 2017-01-01 (×4): qty 1

## 2017-01-01 MED ORDER — GABAPENTIN 400 MG PO CAPS
400.0000 mg | ORAL_CAPSULE | Freq: Four times a day (QID) | ORAL | Status: DC
  Administered 2017-01-02 – 2017-01-04 (×4): 400 mg via ORAL
  Filled 2017-01-01 (×6): qty 1

## 2017-01-01 MED ORDER — ENOXAPARIN SODIUM 40 MG/0.4ML ~~LOC~~ SOLN
40.0000 mg | SUBCUTANEOUS | Status: DC
  Administered 2017-01-01 – 2017-01-02 (×2): 40 mg via SUBCUTANEOUS
  Filled 2017-01-01 (×2): qty 0.4

## 2017-01-01 MED ORDER — ASPIRIN 81 MG PO CHEW
324.0000 mg | CHEWABLE_TABLET | Freq: Once | ORAL | Status: AC
  Administered 2017-01-01: 324 mg via ORAL
  Filled 2017-01-01: qty 4

## 2017-01-01 MED ORDER — MIRABEGRON ER 50 MG PO TB24
50.0000 mg | ORAL_TABLET | Freq: Every day | ORAL | Status: DC
  Administered 2017-01-02: 50 mg via ORAL
  Filled 2017-01-01 (×5): qty 1

## 2017-01-01 MED ORDER — VANCOMYCIN HCL IN DEXTROSE 750-5 MG/150ML-% IV SOLN
750.0000 mg | Freq: Two times a day (BID) | INTRAVENOUS | Status: DC
  Administered 2017-01-01 – 2017-01-02 (×3): 750 mg via INTRAVENOUS
  Filled 2017-01-01 (×6): qty 150

## 2017-01-01 MED ORDER — PIPERACILLIN-TAZOBACTAM 3.375 G IVPB 30 MIN
3.3750 g | Freq: Once | INTRAVENOUS | Status: AC
  Administered 2017-01-01: 3.375 g via INTRAVENOUS
  Filled 2017-01-01: qty 50

## 2017-01-01 MED ORDER — CLONAZEPAM 0.5 MG PO TABS
0.5000 mg | ORAL_TABLET | Freq: Two times a day (BID) | ORAL | Status: DC
  Administered 2017-01-02 – 2017-01-04 (×3): 0.5 mg via ORAL
  Filled 2017-01-01 (×5): qty 1

## 2017-01-01 MED ORDER — DICLOFENAC SODIUM 1 % TD GEL
2.0000 g | Freq: Four times a day (QID) | TRANSDERMAL | Status: DC | PRN
  Filled 2017-01-01: qty 100

## 2017-01-01 MED ORDER — ACETAMINOPHEN 650 MG RE SUPP: 650.0000 mg | Freq: Four times a day (QID) | RECTAL | Status: DC | PRN

## 2017-01-01 MED ORDER — ACETAMINOPHEN 650 MG RE SUPP
650.0000 mg | RECTAL | Status: DC | PRN
  Administered 2017-01-02 (×2): 650 mg via RECTAL
  Filled 2017-01-01 (×2): qty 1

## 2017-01-01 MED ORDER — TRAZODONE HCL 50 MG PO TABS
25.0000 mg | ORAL_TABLET | Freq: Every evening | ORAL | Status: DC | PRN
  Filled 2017-01-01: qty 1

## 2017-01-01 MED ORDER — SODIUM CHLORIDE 0.9 % IV SOLN
INTRAVENOUS | Status: AC
  Administered 2017-01-01: 22:00:00 via INTRAVENOUS

## 2017-01-01 MED ORDER — SODIUM CHLORIDE 0.9 % IV BOLUS (SEPSIS)
1000.0000 mL | Freq: Once | INTRAVENOUS | Status: AC
  Administered 2017-01-01: 1000 mL via INTRAVENOUS

## 2017-01-01 MED ORDER — ACETAMINOPHEN 325 MG PO TABS: 650.0000 mg | ORAL_TABLET | Freq: Four times a day (QID) | ORAL | Status: DC | PRN

## 2017-01-01 MED ORDER — DESMOPRESSIN ACETATE 0.83 MCG/0.1ML NA EMUL: 1.0000 | Freq: Every day | NASAL | Status: DC

## 2017-01-01 MED ORDER — ROPINIROLE HCL 0.25 MG PO TABS
0.2500 mg | ORAL_TABLET | Freq: Every day | ORAL | Status: DC
  Administered 2017-01-02 – 2017-01-04 (×2): 0.25 mg via ORAL
  Filled 2017-01-01 (×4): qty 1

## 2017-01-01 MED ORDER — BISACODYL 5 MG PO TBEC: 5.0000 mg | DELAYED_RELEASE_TABLET | Freq: Every day | ORAL | Status: DC | PRN

## 2017-01-01 MED ORDER — LINACLOTIDE 145 MCG PO CAPS
145.0000 ug | ORAL_CAPSULE | Freq: Every day | ORAL | Status: DC
  Administered 2017-01-02: 11:00:00 145 ug via ORAL
  Filled 2017-01-01 (×4): qty 1

## 2017-01-01 MED ORDER — HYDROMORPHONE HCL 1 MG/ML IJ SOLN
1.0000 mg | INTRAMUSCULAR | Status: DC | PRN
Start: 2017-01-01 — End: 2017-01-03
  Administered 2017-01-01: 1 mg via INTRAVENOUS

## 2017-01-01 MED ORDER — HYDROCODONE-ACETAMINOPHEN 5-325 MG PO TABS: 1.0000 | ORAL_TABLET | ORAL | Status: DC | PRN

## 2017-01-01 MED ORDER — BACLOFEN 10 MG PO TABS
10.0000 mg | ORAL_TABLET | Freq: Three times a day (TID) | ORAL | Status: DC
  Administered 2017-01-02 – 2017-01-04 (×4): 10 mg via ORAL
  Filled 2017-01-01 (×12): qty 1

## 2017-01-01 MED ORDER — FLUTICASONE PROPIONATE 50 MCG/ACT NA SUSP
2.0000 | Freq: Every day | NASAL | Status: DC
  Filled 2017-01-01: qty 16

## 2017-01-01 MED ORDER — HYDROMORPHONE HCL 1 MG/ML IJ SOLN
1.0000 mg | Freq: Four times a day (QID) | INTRAMUSCULAR | Status: DC
  Administered 2017-01-01: 23:00:00 1 mg via INTRAVENOUS
  Filled 2017-01-01: qty 1

## 2017-01-01 MED ORDER — MUPIROCIN 2 % EX OINT
1.0000 "application " | TOPICAL_OINTMENT | Freq: Three times a day (TID) | CUTANEOUS | Status: DC
  Filled 2017-01-01: qty 22

## 2017-01-01 MED ORDER — LEVETIRACETAM 250 MG PO TABS
250.0000 mg | ORAL_TABLET | Freq: Two times a day (BID) | ORAL | Status: DC
  Filled 2017-01-01: qty 1

## 2017-01-01 MED ORDER — HYDROMORPHONE HCL 1 MG/ML IJ SOLN
1.0000 mg | Freq: Once | INTRAMUSCULAR | Status: AC
  Administered 2017-01-03: 18:00:00 1 mg via INTRAVENOUS
  Filled 2017-01-01: qty 1

## 2017-01-01 MED ORDER — CYCLOSPORINE 0.05 % OP EMUL
1.0000 [drp] | Freq: Two times a day (BID) | OPHTHALMIC | Status: DC
  Administered 2017-01-02 – 2017-01-04 (×2): 1 [drp] via OPHTHALMIC
  Filled 2017-01-01 (×8): qty 1

## 2017-01-01 MED ORDER — STROKE: EARLY STAGES OF RECOVERY BOOK
Freq: Once | Status: AC
  Administered 2017-01-01: 20:00:00

## 2017-01-01 MED ORDER — MAGNESIUM OXIDE 400 (241.3 MG) MG PO TABS
400.0000 mg | ORAL_TABLET | Freq: Every day | ORAL | Status: DC
  Administered 2017-01-02: 11:00:00 400 mg via ORAL
  Filled 2017-01-01 (×3): qty 1

## 2017-01-01 MED ORDER — ACETAMINOPHEN 325 MG PO TABS
650.0000 mg | ORAL_TABLET | ORAL | Status: DC | PRN
  Administered 2017-01-04: 01:00:00 650 mg via ORAL
  Filled 2017-01-01 (×2): qty 2

## 2017-01-01 MED ORDER — CHLORZOXAZONE 500 MG PO TABS
750.0000 mg | ORAL_TABLET | Freq: Every day | ORAL | Status: DC
  Administered 2017-01-02: 750 mg via ORAL
  Filled 2017-01-01 (×5): qty 2

## 2017-01-01 MED ORDER — SODIUM CHLORIDE 0.9 % IV SOLN
250.0000 mg | Freq: Once | INTRAVENOUS | Status: AC
  Administered 2017-01-01: 22:00:00 250 mg via INTRAVENOUS
  Filled 2017-01-01: qty 2.5

## 2017-01-01 MED ORDER — LORATADINE 10 MG PO TABS: 10.0000 mg | ORAL_TABLET | Freq: Every day | ORAL | Status: DC

## 2017-01-01 MED ORDER — METHYLPHENIDATE HCL 5 MG PO TABS: 10.0000 mg | ORAL_TABLET | Freq: Three times a day (TID) | ORAL | Status: DC

## 2017-01-01 MED ORDER — TIZANIDINE HCL 2 MG PO TABS
2.0000 mg | ORAL_TABLET | Freq: Three times a day (TID) | ORAL | Status: DC
  Administered 2017-01-02 – 2017-01-04 (×4): 2 mg via ORAL
  Filled 2017-01-01 (×11): qty 1

## 2017-01-01 MED ORDER — VANCOMYCIN HCL IN DEXTROSE 1-5 GM/200ML-% IV SOLN
1000.0000 mg | Freq: Once | INTRAVENOUS | Status: AC
  Administered 2017-01-01: 1000 mg via INTRAVENOUS
  Filled 2017-01-01: qty 200

## 2017-01-01 MED ORDER — ONDANSETRON HCL 4 MG/2ML IJ SOLN: 4.0000 mg | Freq: Four times a day (QID) | INTRAMUSCULAR | Status: DC | PRN

## 2017-01-01 MED ORDER — HYDROMORPHONE HCL 1 MG/ML IJ SOLN
INTRAMUSCULAR | Status: AC
  Filled 2017-01-01: qty 1

## 2017-01-01 MED ORDER — MEMANTINE HCL ER 7 MG PO CP24
21.0000 mg | ORAL_CAPSULE | Freq: Every day | ORAL | Status: DC
  Administered 2017-01-02: 21 mg via ORAL
  Filled 2017-01-01 (×5): qty 3

## 2017-01-01 MED ORDER — HEPARIN SODIUM (PORCINE) 5000 UNIT/ML IJ SOLN: 5000.0000 [IU] | Freq: Three times a day (TID) | INTRAMUSCULAR | Status: DC

## 2017-01-01 MED ORDER — DONEPEZIL HCL 5 MG PO TABS
10.0000 mg | ORAL_TABLET | Freq: Every day | ORAL | Status: DC
  Administered 2017-01-02 – 2017-01-04 (×2): 10 mg via ORAL
  Filled 2017-01-01 (×2): qty 2
  Filled 2017-01-01: qty 1
  Filled 2017-01-01: qty 2

## 2017-01-01 MED ORDER — SODIUM CHLORIDE 0.9 % IV SOLN
250.0000 mg | Freq: Two times a day (BID) | INTRAVENOUS | Status: DC
  Administered 2017-01-02 (×2): 250 mg via INTRAVENOUS
  Filled 2017-01-01 (×4): qty 2.5

## 2017-01-01 MED ORDER — DOCUSATE SODIUM 100 MG PO CAPS
100.0000 mg | ORAL_CAPSULE | Freq: Two times a day (BID) | ORAL | Status: DC
  Administered 2017-01-02: 11:00:00 100 mg via ORAL
  Filled 2017-01-01: qty 1

## 2017-01-01 NOTE — ED Triage Notes (Addendum)
Patient from home via [facility]. Reports headache that started this morning. Currently being treated for ear infection and on antibiotics for cellulitis of leg. Patient's daughter states he has "not been acting right" and has been unable to come up with words or follow commands. Patient has dilaudid pain pump for chronic pain.

## 2017-01-01 NOTE — ED Notes (Signed)
Report called to mattie rn floor nurse.  

## 2017-01-01 NOTE — ED Notes (Signed)
Pt to u/s

## 2017-01-01 NOTE — ED Notes (Signed)
Resumed care from laura rn.  [facility]

## 2017-01-01 NOTE — H&P (Signed)
[facility] [place] at [facility]   PATIENT NAME: Dustin Turner    MR#:  [id number]  DATE OF BIRTH:  Sep 15, 1935  DATE OF ADMISSION:  01/01/2017  PRIMARY CARE PHYSICIAN: Orene Desanctis, MD   REQUESTING/REFERRING PHYSICIAN; Scotty Court  CHIEF COMPLAINT: Altered mental status   Chief Complaint  Patient presents with  . Headache  . Altered Mental Status    HISTORY OF PRESENT ILLNESS:  Dustin Turner  is a 81 y.o. male with a known history of essential hypertension, chronic back pain issues brought in by family because of worsening left leg infection and also confusion.  Patient's daughter patient is having slurred speech for last to 3 days associated with generalized weakness, poor p.o. intake, confusion getting worse.  No fever.  Getting Augmentin for your infection and also leg infection.  Patient has left ear infection and also left leg infection and getting Augmentin for that.  Patient unable to give any history because of confusion, according to patient's family he is very hard of hearing but able to communicate well and not confused but they noticed that he has been confused for the past 2 days and has poor p.o. intake.  Noted to have black discoloration of left second and third toes.  According to family back discoloration of her left second and third toes started yesterday.  CT of the head showed low density area in the left cerebellar hemisphere compatible with acute to subacute infarct.  According to family patient has chronic neck pain and back pain issues, has myoclonic jerks and patient  uses a scooter for mobility at home.  And also has fentanyl lollipops, spinal stimulator. PAST MEDICAL HISTORY:   Past Medical History:  Diagnosis Date  . Arthritis   . BPH (benign prostatic hyperplasia)   . DDD (degenerative disc disease), cervical   . Hypogonadism in male   . Insomnia   . Neurogenic bladder   . OP (osteoporosis)   . Scoliosis   . Spinal stenosis   .  Urinary urgency   . Venous stasis     PAST SURGICAL HISTOIRY:   Past Surgical History:  Procedure Laterality Date  . CERVICAL SPINE SURGERY    . HERNIA REPAIR    . JOINT REPLACEMENT    . LUMBAR SPINE SURGERY    . Proleive System Treatment  2006  . ROTATOR CUFF REPAIR    . THORACIC SPINE SURGERY      SOCIAL HISTORY:   Social History   Tobacco Use  . Smoking status: Never Smoker  . Smokeless tobacco: Never Used  Substance Use Topics  . Alcohol use: No    Alcohol/week: 0.0 oz    FAMILY HISTORY:   Family History  Problem Relation Age of Onset  . Diabetes Father   . Stroke Father   . Alcohol abuse Father   . Diabetes Brother   . Stroke Mother   . Alcohol abuse Brother   . Kidney disease Neg Hx   . Prostate cancer Neg Hx     DRUG ALLERGIES:   Allergies  Allergen Reactions  . Amitriptyline   . Nortriptyline Other (See Comments)    GI  upset    REVIEW OF SYSTEMS:  Patient is confused, unable to give review of systems.  MEDICATIONS AT HOME:   Prior to Admission medications   Medication Sig Start Date End Date Taking? Authorizing Provider  alendronate (FOSAMAX) 70 MG tablet  07/17/14  Yes [provider]  amoxicillin-clavulanate (AUGMENTIN) 875-125  MG tablet Take 1 tablet by mouth 2 (two) times daily. 12/27/16  Yes [provider]  Ascorbic Acid (VITAMIN C) 1000 MG tablet Take 460 mg by mouth.   Yes [provider]  baclofen (LIORESAL) 10 MG tablet Take 10 mg by mouth 3 (three) times daily.   Yes [provider]  Biotin (SUPER BIOTIN) 5 MG TABS 10,000 mg.   Yes [provider]  Calcium Polycarbophil (FIBER-CAPS PO) Take 8 capsules by mouth daily.   Yes [provider]  calcium-vitamin D (CALCIUM 500/D) 500-200 MG-UNIT per tablet Take by mouth.   Yes [provider]  Chlorzoxazone (LORZONE) 750 MG TABS Take 1 tablet by mouth daily. Reported on 04/11/2015   Yes [provider]  clonazePAM  (KLONOPIN) 0.5 MG tablet Take 0.5 mg by mouth 2 (two) times daily.  03/28/16  Yes [provider]  cycloSPORINE (RESTASIS) 0.05 % ophthalmic emulsion  11/08/13  Yes [provider]  Desmopressin Acetate (NOCTIVA) 0.83 MCG/0.1ML EMUL Place 1 Squirt into the nose daily. 12/10/16  Yes MacDiarmid, Lorin PicketScott, MD  diclofenac sodium (VOLTAREN) 1 % GEL Apply topically.   Yes [provider]  docusate sodium (COLACE) 100 MG capsule Take 100 mg by mouth daily.   Yes [provider]  donepezil (ARICEPT) 10 MG tablet Take 10 mg by mouth at bedtime.  05/04/13  Yes [provider]  DULoxetine (CYMBALTA) 60 MG capsule Take 30 mg by mouth daily.   Yes [provider]  eszopiclone (LUNESTA) 2 MG TABS tablet take 1 tablet as needed for sleep 06/11/14  Yes [provider]  fentaNYL (ACTIQ) 800 MCG lollipop Reported on 04/11/2015 04/27/13  Yes [provider]  Fish Oil-Cholecalciferol (FISH OIL + D3) 1000-1000 MG-UNIT CAPS Take by mouth.   Yes [provider]  fluticasone (FLONASE) 50 MCG/ACT nasal spray Place 2 sprays into both nostrils daily.   Yes [provider]  gabapentin (NEURONTIN) 400 MG capsule Take 400 mg by mouth 4 (four) times daily.    Yes [provider]  HYDROmorphone, PF, (DILAUDID HP) 250 MG injection Inject 10 mg as directed continuously.    Yes [provider]  L-Methylfolate-Algae-B12-B6 Latrelle Dodrill(METANX) 3-90.314-2-35 MG CAPS Reported on 04/11/2015 11/18/12  Yes [provider]  l-methylfolate-B6-B12 (METANX) 3-35-2 MG TABS tablet Take 1 tablet by mouth daily.   Yes [provider]  levETIRAcetam (KEPPRA) 250 MG tablet Take 250 mg by mouth 2 (two) times daily.   Yes [provider]  Karlene EinsteinLINZESS 290 MCG CAPS capsule  06/12/14  Yes [provider]  loratadine (CLARITIN) 10 MG tablet Take 10 mg by mouth daily.   Yes [provider]  Magnesium 250 MG TABS Take 1 tablet by  mouth daily.   Yes [provider]  memantine (NAMENDA TITRATION PACK) tablet pack Take by mouth daily.    Yes [provider]  methylphenidate (RITALIN) 10 MG tablet Take 10 mg by mouth 3 (three) times daily with meals.    Yes [provider]  mirabegron ER (MYRBETRIQ) 50 MG TB24 tablet Take 1 tablet (50 mg total) by mouth daily. 09/06/16  Yes MacDiarmid, Lorin PicketScott, MD  MOVANTIK 12.5 MG TABS tablet Take 12.5 mg by mouth daily.  05/30/14  Yes [provider]  Multiple Vitamin (MULTIVITAMIN) capsule Take 1 capsule by mouth daily.   Yes [provider]  mupirocin ointment (BACTROBAN) 2 % Apply 1 application topically 3 (three) times daily. 12/27/16  Yes [provider]  ofloxacin (FLOXIN) 0.3 % OTIC solution Place 5 drops into the left ear 2 (two) times daily. 12/28/16  Yes [provider]  rOPINIRole (REQUIP) 0.25 MG tablet Take 0.25 mg by mouth at bedtime.   Yes [provider]  Testosterone 20.25 MG/ACT (1.62%) GEL Place 1 Pump onto the skin daily.  10/03/12  Yes [provider]  tizanidine (ZANAFLEX) 2 MG capsule Take 1 capsule by mouth 3 (three) times daily. 10/15/16  Yes [provider]  Insulin Pen Needle (PEN NEEDLES 31GX5/16") 31G X 8 MM MISC 1 each by Does not apply route. 04/20/11   [provider]  NARCAN 4 MG/0.1ML LIQD Reported on 04/11/2015 06/30/14   [provider]      VITAL SIGNS:  Blood pressure (!) 166/85, pulse 77, temperature 98.6 F (37 C), temperature source Oral, resp. rate 18, height 5\' 9"  (1.753 m), weight 74.8 kg (165 lb), SpO2 100 %.  PHYSICAL EXAMINATION:  GENERAL:  81 y.o.-year-old patient lying in the bed.  Recently requesting pain medicine for headache.  Patient has chronic neck pain and back pain issues. EYES: Pupils equal, round, reactive to ligh.No scleral icterus. Extraocular muscles intact.  HEENT: Head atraumatic, normocephalic. Oropharynx and nasopharynx clear.   NECK:  Supple, no jugular venous distention. No thyroid enlargement, no tenderness.  LUNGS: Normal breath sounds bilaterally, no wheezing, rales,rhonchi or crepitation. No use of accessory muscles of respiration.  CARDIOVASCULAR: S1, S2 normal. No murmurs, rubs, or gallops.  ABDOMEN: Soft, nontender, nondistended. Bowel sounds present. No organomegaly or mass.  EXTREMITIES: No pedal edema, cyanosis, or clubbing.  NEUROLOGIC: No facial droop was observed.  Speech also appears clear, he is disoriented and unable to follow commands for neurological exam.  PSYCHIATRIC: Patient is disoriented and complaints of head hurting and asking for pain medicine. SKIN: Bilateral leg edema, redness of the left leg with weeping areas of left leg.  Patient also has black discoloration of left second, third toes.  LABORATORY PANEL:   CBC Recent Labs  Lab 01/01/17 1239  WBC 9.5  HGB 13.3  HCT 40.1  PLT 262   ------------------------------------------------------------------------------------------------------------------  Chemistries  Recent Labs  Lab 01/01/17 1239  NA 130*  K 4.2  CL 87*  CO2 31  GLUCOSE 165*  BUN 21*  CREATININE 0.80  CALCIUM 9.2  AST 38  ALT 35  ALKPHOS 110  BILITOT 0.5   ------------------------------------------------------------------------------------------------------------------  Cardiac Enzymes No results for input(s): TROPONINI in the last 168 hours. ------------------------------------------------------------------------------------------------------------------  RADIOLOGY:  Dg Tibia/fibula Left  Result Date: 01/01/2017 CLINICAL DATA:  Generalized weakness.  Left leg swelling EXAM: LEFT TIBIA AND FIBULA - 2 VIEW COMPARISON:  None. FINDINGS: Changes of prior left knee replacement. No hardware complicating feature. No acute bony abnormality. Specifically, no fracture, subluxation, or dislocation. Soft tissues are intact. IMPRESSION: Prior left knee  replacement.  No acute bony abnormality. Electronically Signed   By: Charlett NoseKevin  Dover M.D.   On: 01/01/2017 13:32   Ct Head Wo Contrast  Result Date: 01/01/2017 CLINICAL DATA:  Altered level of consciousness. EXAM: CT HEAD WITHOUT CONTRAST TECHNIQUE: Contiguous axial images were obtained from the base of the skull through the vertex without intravenous contrast. COMPARISON:  10/24/2016 FINDINGS: Brain: Low-density in the left cerebellar hemisphere is new since prior study compatible with acute to subacute infarct. There is atrophy and chronic small vessel disease changes. No hemorrhage. Mild ventricular prominence likely related to ex vacuo dilatation. Vascular: No hyperdense vessel or unexpected calcification. Skull: No acute calvarial abnormality.  Sinuses/Orbits: Complete opacification of the left mastoid air cells. Visualized paranasal sinuses clear. Other: None IMPRESSION: New low-density area in the left cerebellar hemisphere compatible with acute to subacute infarction. Atrophy, chronic small vessel disease. Left mastoid effusion, new, question mastoiditis. Electronically Signed   By: Charlett Nose M.D.   On: 01/01/2017 13:53   US Venous Img Lower Unilateral Left  Result Date: 01/01/2017 CLINICAL DATA:  Left leg swelling EXAM: LEFT LOWER EXTREMITY VENOUS DOPPLER ULTRASOUND TECHNIQUE: Gray-scale sonography with graded compression, as well as color Doppler and duplex ultrasound were performed to evaluate the lower extremity deep venous systems from the level of the common femoral vein and including the common femoral, femoral, profunda femoral, popliteal and calf veins including the posterior tibial, peroneal and gastrocnemius veins when visible. The superficial great saphenous vein was also interrogated. Spectral Doppler was utilized to evaluate flow at rest and with distal augmentation maneuvers in the common femoral, femoral and popliteal veins. COMPARISON:  None. FINDINGS: Contralateral Common Femoral  Vein: Respiratory phasicity is normal and symmetric with the symptomatic side. No evidence of thrombus. Normal compressibility. Common Femoral Vein: No evidence of thrombus. Normal compressibility, respiratory phasicity and response to augmentation. Saphenofemoral Junction: No evidence of thrombus. Normal compressibility and flow on color Doppler imaging. Profunda Femoral Vein: No evidence of thrombus. Normal compressibility and flow on color Doppler imaging. Femoral Vein: No evidence of thrombus. Normal compressibility, respiratory phasicity and response to augmentation. Popliteal Vein: No evidence of thrombus. Normal compressibility, respiratory phasicity and response to augmentation. Calf Veins: No evidence of thrombus. Normal compressibility and flow on color Doppler imaging. Superficial Great Saphenous Vein: No evidence of thrombus. Normal compressibility. Venous Reflux:  None. Other Findings:  None. IMPRESSION: No evidence of deep venous thrombosis. Electronically Signed   By: Charlett Nose M.D.   On: 01/01/2017 14:33   Dg Chest Portable 1 View  Result Date: 01/01/2017 CLINICAL DATA:  Generalized weakness, confusion EXAM: PORTABLE CHEST 1 VIEW COMPARISON:  None. FINDINGS: Moderate-sized hiatal hernia. Bibasilar atelectasis. Heart is borderline in size. No effusions or acute bony abnormality. IMPRESSION: Borderline heart size.  Bibasilar atelectasis. Moderate-sized hiatal hernia. Electronically Signed   By: Charlett Nose M.D.   On: 01/01/2017 13:32    EKG:   Orders placed or performed during the hospital encounter of 01/01/17  . EKG 12-Lead  . EKG 12-Lead  . ED EKG  . ED EKG  . EKG 12-Lead  . EKG 12-Lead   EKG shows normal sinus rhythm at 77 bpm, no ST-T changes. IMPRESSION AND PLAN:    Assessment and plan: 81 year old male patient brought in by family because of altered mental status, decreased p.o. intake for the past 2 days, worsening left leg redness.  #1 altered mental status with   decreased p.o. intake, for the past 2 days and patient was fine on Saturday.  Noted to be more confused with decreased p.o. intake for the past 2 days.  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  Admit to stroke unit, patient has spinal stimulator and a metal in the back unable to get MRI of the brain so patient will get ultrasound of carotids, echocardiogram, continue aspirin, statins, obtain neurology consult, out of window for TPA  #2 left leg cellulitis: Patient will be on vancomycin.  Patient also has mastoiditis.  Continue vancomycin, left middle ear exam showed very dull tympanic membranes.  But no obvious drainage.  #3 chronic pain syndrome, history of myoclonic seizures: Patient is followed by neurology  from [facility], is on Cymbalta, Aricept, Klonopin, Lotrisone, Neurontin, baclofen, also on Namenda, fentanyl lollipops which we do not have here.  Use IV Dilaudid for pain control.  According to family patient uses scooter all the time for mobility. #4 history of sleep apnea: Continue CPAP at night. disCussed with patient's daughter, confirm edhis DNR status. All the records are reviewed and case discussed with ED provider. Management plans discussed with the patient, family and they are in agreement.  CODE STATUS: DNR  TOTAL TIME TAKING CARE OF THIS PATIENT: 55 minutes.    Katha Hamming M.D on 01/01/2017 at 4:21 PM  Between 7am to 6pm - Pager - [phone]  After 6pm go to [email or web address] - password [email or web address]  [facility]  Office  [phone]  CC: Primary care physician; Orene Desanctis, MD  Note: This dictation was prepared with Dragon dictation along with smaller phrase technology. Any transcriptional errors that result from this process are unintentional.

## 2017-01-01 NOTE — Progress Notes (Signed)
Pharmacy Antibiotic Note  Dustin LainJohn David Turner is a 81 y.o. male admitted on 01/01/2017 with cellulitis.  Pharmacy has been consulted for vancomycin dosing.  Plan: Vancomycin 1000 mg given in ED. Will order vancomycin 750 mg IV q12h Goal VT 10-15 mcg/mL VT ordered prior to 5th dose which should represent steady state  Kinetics: Using actual body weight = 75 kg, CrCl 72 mL/min Ke: 0.067 Half-life: 10 hrs Vd: 52 L Cmin (estimated) ~11 mcg/mL  Height: 5\' 9"  (175.3 cm) Weight: 165 lb (74.8 kg) IBW/kg (Calculated) : 70.7  Temp (24hrs), Avg:98.6 F (37 C), Min:98.6 F (37 C), Max:98.6 F (37 C)  Recent Labs  Lab 01/01/17 1239  WBC 9.5  CREATININE 0.80    Estimated Creatinine Clearance: 74.9 mL/min (by C-G formula based on SCr of 0.8 mg/dL).    Allergies  Allergen Reactions  . Amitriptyline   . Nortriptyline Other (See Comments)    GI  upset   Antimicrobials this admission: vancomycin 12/18 >>   Dose adjustments this admission:  Microbiology results: 12/17 UCx: Sent   Thank you for allowing pharmacy to be a part of this patient's care.  Dustin CarbonMary M Raistlin Turner, PharmD, BCPS Clinical Pharmacist 01/01/2017 4:53 PM

## 2017-01-01 NOTE — ED Provider Notes (Signed)
[facility] Emergency Department Provider Note  ____________________________________________  Time seen: Approximately 1:06 PM  I have reviewed the triage vital signs and the nursing notes.   HISTORY  Chief Complaint Headache and Altered Mental Status  Level 5 Caveat: Portions of the History and Physical are unable to be obtained due to patient being a poor historian Additional history obtained at bedside by daughter  HPI Dustin Turner is a 81 y.o. male brought to the ED by his daughter due to worsening generalized weakness, progressive over the past several weeks, along with worsening swelling and redness of the left lower leg and today some new confusion.  Patient has been treated for a left ear infection for the past several weeks. He had 2 courses of amoxicillin, abdomen with most recently put on Augmentin. This has not improved his symptoms. He also then developed erythema and swelling of the left leg. This is worsening despite antibiotic use as well. Patient denies any chest pain or shortness of breath. Only acute complaint is generalized weakness which is slowly progressive. Daughter reports that he used to be able to walk "barely", and now he is bedbound except for using his mobility scooter.  Patient has had decreased oral intake for the past 3 days   Past Medical History:  Diagnosis Date  . Arthritis   . BPH (benign prostatic hyperplasia)   . DDD (degenerative disc disease), cervical   . Hypogonadism in male   . Insomnia   . Neurogenic bladder   . OP (osteoporosis)   . Scoliosis   . Spinal stenosis   . Urinary urgency   . Venous stasis      Patient Active Problem List   Diagnosis Date Noted  . Chronic venous insufficiency 04/09/2016  . Urinary urgency 05/27/2015  . Hypogonadism in male 05/27/2015  . Urinary frequency 04/15/2015  . BPH with obstruction/lower urinary tract symptoms 04/15/2015  . Abdominal pain 07/23/2014  . Abnormal  gait 07/23/2014  . Absolute anemia 07/23/2014  . Cognitive disorder 07/23/2014  . Narrowing of intervertebral disc space 07/23/2014  . Clinical depression 07/23/2014  . Acid reflux 07/23/2014  . Abdominal hernia 07/23/2014  . HLD (hyperlipidemia) 07/23/2014  . BP (high blood pressure) 07/23/2014  . Amnesia 07/23/2014  . 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 Scoliosis 07/23/2014  . Compressed spine fracture (HCC) 07/23/2014  . Spinal stenosis 07/23/2014  . Facial numbness 06/03/2014  . Spasm 04/09/2014  . Amnestic syndrome, drug-induced (HCC) 12/08/2013  . Obstructive apnea 12/08/2013  . Generalized OA 11/19/2013  . Bad memory 11/19/2013  . Leg pain 09/04/2013  . Polypharmacy 08/20/2013  . 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 Abnormal finding on thyroid function test 07/30/2013  . Daytime somnolence 07/30/2013  . Encounter for screening for lipoid disorders 07/30/2013  . OP (osteoporosis) 07/30/2013  . Chronic pain 06/29/2013  . Cannot sleep 06/29/2013  . Dorsopathy 06/29/2013  . Dermatitis, stasis 06/29/2013  . Blue color skin 06/05/2013  . Excessive falling 06/05/2013  . Able to mobilize using mobility aids 06/05/2013  . Curvature of spine 10/10/2012  . Acquired deformities of spine 10/10/2012  . 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 FOM (frequency of micturition) 08/21/2011  . Cervical spondylosis without myelopathy 05/03/2011  . Cervical osteoarthritis 05/03/2011  . DDD (degenerative disc disease), lumbosacral 05/03/2011  . Angulation of spine 05/03/2011  . LBP (low back pain) 05/03/2011  . Degenerative arthritis of lumbar spine 05/03/2011  . Lumbosacral spondylosis 05/03/2011  .  Back pain, chronic 03/29/2011  . Benign fibroma of prostate 03/29/2011  . Urgency of micturation 03/29/2011  . Hernia, incisional 08/21/2010  . Exomphalos 08/21/2010  . Arthritis 08/07/2010  . CN (constipation) 08/07/2010  . Exposure to radiation 03/15/1953      Past Surgical History:  Procedure Laterality Date  . CERVICAL SPINE SURGERY    . HERNIA REPAIR    . JOINT REPLACEMENT    . LUMBAR SPINE SURGERY    . Proleive System Treatment  2006  . ROTATOR CUFF REPAIR    . THORACIC SPINE SURGERY       Prior to Admission medications   Medication Sig Start Date End Date Taking? Authorizing Provider  alendronate (FOSAMAX) 70 MG tablet  07/17/14   [provider]  ammonium lactate (LAC-HYDRIN) 12 % lotion Apply topically. 04/12/14   [provider]  amoxicillin-clavulanate (AUGMENTIN) 875-125 MG tablet Take 1 tablet by mouth 2 (two) times daily. 12/27/16   [provider]  Ascorbic Acid (VITAMIN C) 1000 MG tablet Take 460 mg by mouth.    [provider]  Biotin (SUPER BIOTIN) 5 MG TABS 10,000 mg.    [provider]  calcium-vitamin D (CALCIUM 500/D) 500-200 MG-UNIT per tablet Take by mouth.    [provider]  Chlorzoxazone (LORZONE) 750 MG TABS Take by mouth. Reported on 04/11/2015    [provider]  Cholecalciferol (D 400) 400 units CHEW Chew by mouth. Reported on 04/11/2015    [provider]  clonazePAM (KLONOPIN) 0.5 MG tablet Take by mouth. 03/28/16   [provider]  cycloSPORINE (RESTASIS) 0.05 % ophthalmic emulsion  11/08/13   [provider]  Desmopressin Acetate (NOCTIVA) 0.83 MCG/0.1ML EMUL Place 1 Squirt into the nose daily. 12/10/16   Alfredo MartinezMacDiarmid, Scott, MD  diclofenac sodium (VOLTAREN) 1 % GEL Apply topically.    [provider]  donepezil (ARICEPT) 10 MG tablet 10 mg. 05/04/13   [provider]  DULoxetine (CYMBALTA) 30 MG capsule Take 30 mg by mouth daily.    [provider]  erythromycin ophthalmic ointment Reported on 05/10/2015 07/15/14   [provider]  Eszopiclone 3 MG TABS Reported on 04/11/2015 06/11/14   [provider]  fentaNYL (ACTIQ) 800 MCG lollipop Reported on 04/11/2015 04/27/13   [provider]  Fish Oil-Cholecalciferol (FISH OIL + D3) 1000-1000 MG-UNIT CAPS Take by mouth.    [provider]  gabapentin (NEURONTIN) 400 MG capsule Take 400 mg by mouth.    [provider]  HYDROmorphone, PF, (DILAUDID HP) 250 MG injection Inject 10 mg as directed continuously.     [provider]  ibuprofen (ADVIL,MOTRIN) 800 MG tablet Take 800 mg by mouth.    [provider]  Insulin Pen Needle (PEN NEEDLES 31GX5/16") 31G X 8 MM MISC 1 each by Does not apply route. 04/20/11   [provider]  L-Methylfolate-Algae-B12-B6 Latrelle Dodrill(METANX) 3-90.314-2-35 MG CAPS Reported on 04/11/2015 11/18/12   [provider]  Karlene EinsteinLINZESS 290 MCG CAPS capsule  06/12/14   [provider]  magnesium hydroxide (MILK OF MAGNESIA) 400 MG/5ML suspension Take by mouth. Reported on 04/11/2015    [provider]  memantine (NAMENDA) 10 MG tablet Take by mouth.    [provider]  methylphenidate (RITALIN) 10 MG tablet Take by mouth.    [provider]  mexiletine (MEXITIL) 200 MG capsule Reported on 04/11/2015 05/17/14   [provider]  mirabegron ER (MYRBETRIQ) 50 MG TB24 tablet Take 1 tablet (  50 mg total) by mouth daily. 09/06/16   Alfredo Martinez, MD  MOVANTIK 12.5 MG TABS tablet  05/30/14   [provider]  mupirocin ointment (BACTROBAN) 2 % Apply 1 application topically 3 (three) times daily. 12/27/16   [provider]  naproxen (NAPROXEN DR) 500 MG EC tablet Take by mouth. 10/29/12   [provider]  NARCAN 4 MG/0.1ML LIQD Reported on 04/11/2015 06/30/14   [provider]  ofloxacin (FLOXIN) 0.3 % OTIC solution Place 5 drops into the left ear 2 (two) times daily. 12/28/16   [provider]  oxyCODONE (OXYCONTIN) 40 mg 12 hr tablet Take by mouth. Reported on 04/11/2015    [provider]  Testosterone 20.25 MG/ACT (1.62%) GEL Place onto the skin. 10/03/12   [provider]   tizanidine (ZANAFLEX) 2 MG capsule Take 1 capsule by mouth 3 (three) times daily. 10/15/16   [provider]  triamcinolone cream (KENALOG) 0.1 % apply to affected area twice a day UNTIL IMPROVED 05/05/14   [provider]  vitamin B-12 (CYANOCOBALAMIN) 1000 MCG tablet Take by mouth.    [provider]     Allergies Amitriptyline and Nortriptyline   Family History  Problem Relation Age of Onset  . Diabetes Father   . Stroke Father   . Alcohol abuse Father   . Diabetes Brother   . Stroke Mother   . Alcohol abuse Brother   . Kidney disease Neg Hx   . Prostate cancer Neg Hx     Social History Social History   Tobacco Use  . Smoking status: Never Smoker  . Smokeless tobacco: Never Used  Substance Use Topics  . Alcohol use: No    Alcohol/week: 0.0 oz  . Drug use: No    Review of Systems Unable to reliably obtained due to altered mental status and poor historian. Constitutional:   No fever.    Musculoskeletal:   Positive left leg pain and swelling  ____________________________________________   PHYSICAL EXAM:  VITAL SIGNS: ED Triage Vitals  Enc Vitals Group     BP 01/01/17 1221 (!) 166/85     Pulse Rate 01/01/17 1221 77     Resp 01/01/17 1221 18     Temp 01/01/17 1221 98.6 F (37 C)     Temp Source 01/01/17 1221 Oral     SpO2 01/01/17 1221 100 %     Weight 01/01/17 1222 165 lb (74.8 kg)     Height 01/01/17 1222 5\' 9"  (1.753 m)     Head Circumference --      Peak Flow --      Pain Score 01/01/17 1221 10     Pain Loc --      Pain Edu? --      Excl. in GC? --     Vital signs reviewed, nursing assessments reviewed.   Constitutional:   Alert Well appearing and in no distress. Eyes:   No scleral icterus.  EOMI. No nystagmus. No conjunctival pallor. PERRL. ENT   Head:   Normocephalic and atraumatic. Left external canal full of purulent exudate. Tympanic membrane is intact and not inflamed   Nose:   No congestion/rhinnorhea.     Mouth/Throat:   MMM, no pharyngeal erythema. No peritonsillar mass.    Neck:   No meningismus. Full ROM. Hematological/Lymphatic/Immunilogical:   No cervical lymphadenopathy. Cardiovascular:   RRR. Symmetric bilateral radial and DP pulses.  No murmurs.  Respiratory:   Normal respiratory effort without tachypnea/retractions. Breath sounds are  clear and equal bilaterally. No wheezes/rales/rhonchi. Gastrointestinal:   Soft and nontender. Non distended. There is no CVA tenderness.  No rebound, rigidity, or guarding. Genitourinary:   deferred Musculoskeletal:   Trace pitting edema right lower extremity. 3+ pitting edema left lower extremity with a large 10 cm cutaneous wound on the posterior calf of the left leg. There is diffuse erythema below the knee all the way down to the toes. There are blue toes bilaterally with dry necrosis on the left third and fourth toes distally. Intact dorsalis pedis pulses. Neurologic:   Normal speech, limited range of language. Difficulty following commands..  Motor grossly intact. Cranial nerves III through XII appear intact within the limitations of patient's ability to participate in exam. Difficulty assessing orientation due to patient's inability to answer questions clearly.  Skin:    Skin is warm, dry and intact. No rash noted.  No petechiae, purpura, or bullae.  ____________________________________________    LABS (pertinent positives/negatives) (all labs ordered are listed, but only abnormal results are displayed) Labs Reviewed  COMPREHENSIVE METABOLIC PANEL - Abnormal; Notable for the following components:      Result Value   Sodium 130 (*)    Chloride 87 (*)    Glucose, Bld 165 (*)    BUN 21 (*)    Albumin 3.1 (*)    All other components within normal limits  CBC - Abnormal; Notable for the following components:   RBC 4.27 (*)    All other components within normal limits  URINALYSIS, COMPLETE (UACMP) WITH MICROSCOPIC - Abnormal; Notable  for the following components:   Color, Urine YELLOW (*)    APPearance CLEAR (*)    Squamous Epithelial / LPF 0-5 (*)    All other components within normal limits  URINE CULTURE   ____________________________________________   EKG  Interpreted by me Sinus rhythm rate of 77, left axis, normal intervals. LVH.  No acute ischemic changes.  ____________________________________________    RADIOLOGY  Dg Tibia/fibula Left  Result Date: 01/01/2017 CLINICAL DATA:  Generalized weakness.  Left leg swelling EXAM: LEFT TIBIA AND FIBULA - 2 VIEW COMPARISON:  None. FINDINGS: Changes of prior left knee replacement. No hardware complicating feature. No acute bony abnormality. Specifically, no fracture, subluxation, or dislocation. Soft tissues are intact. IMPRESSION: Prior left knee replacement.  No acute bony abnormality. Electronically Signed   By: Charlett Nose M.D.   On: 01/01/2017 13:32   Ct Head Wo Contrast  Result Date: 01/01/2017 CLINICAL DATA:  Altered level of consciousness. EXAM: CT HEAD WITHOUT CONTRAST TECHNIQUE: Contiguous axial images were obtained from the base of the skull through the vertex without intravenous contrast. COMPARISON:  10/24/2016 FINDINGS: Brain: Low-density in the left cerebellar hemisphere is new since prior study compatible with acute to subacute infarct. There is atrophy and chronic small vessel disease changes. No hemorrhage. Mild ventricular prominence likely related to ex vacuo dilatation. Vascular: No hyperdense vessel or unexpected calcification. Skull: No acute calvarial abnormality. Sinuses/Orbits: Complete opacification of the left mastoid air cells. Visualized paranasal sinuses clear. Other: None IMPRESSION: New low-density area in the left cerebellar hemisphere compatible with acute to subacute infarction. Atrophy, chronic small vessel disease. Left mastoid effusion, new, question mastoiditis. Electronically Signed   By: Charlett Nose M.D.   On: 01/01/2017 13:53    US Venous Img Lower Unilateral Left  Result Date: 01/01/2017 CLINICAL DATA:  Left leg swelling EXAM: LEFT LOWER EXTREMITY VENOUS DOPPLER ULTRASOUND TECHNIQUE: Gray-scale sonography with graded compression, as well as color Doppler and  duplex ultrasound were performed to evaluate the lower extremity deep venous systems from the level of the common femoral vein and including the common femoral, femoral, profunda femoral, popliteal and calf veins including the posterior tibial, peroneal and gastrocnemius veins when visible. The superficial great saphenous vein was also interrogated. Spectral Doppler was utilized to evaluate flow at rest and with distal augmentation maneuvers in the common femoral, femoral and popliteal veins. COMPARISON:  None. FINDINGS: Contralateral Common Femoral Vein: Respiratory phasicity is normal and symmetric with the symptomatic side. No evidence of thrombus. Normal compressibility. Common Femoral Vein: No evidence of thrombus. Normal compressibility, respiratory phasicity and response to augmentation. Saphenofemoral Junction: No evidence of thrombus. Normal compressibility and flow on color Doppler imaging. Profunda Femoral Vein: No evidence of thrombus. Normal compressibility and flow on color Doppler imaging. Femoral Vein: No evidence of thrombus. Normal compressibility, respiratory phasicity and response to augmentation. Popliteal Vein: No evidence of thrombus. Normal compressibility, respiratory phasicity and response to augmentation. Calf Veins: No evidence of thrombus. Normal compressibility and flow on color Doppler imaging. Superficial Great Saphenous Vein: No evidence of thrombus. Normal compressibility. Venous Reflux:  None. Other Findings:  None. IMPRESSION: No evidence of deep venous thrombosis. Electronically Signed   By: Charlett Nose M.D.   On: 01/01/2017 14:33   Dg Chest Portable 1 View  Result Date: 01/01/2017 CLINICAL DATA:  Generalized weakness, confusion EXAM:  PORTABLE CHEST 1 VIEW COMPARISON:  None. FINDINGS: Moderate-sized hiatal hernia. Bibasilar atelectasis. Heart is borderline in size. No effusions or acute bony abnormality. IMPRESSION: Borderline heart size.  Bibasilar atelectasis. Moderate-sized hiatal hernia. Electronically Signed   By: Charlett Nose M.D.   On: 01/01/2017 13:32    ____________________________________________   PROCEDURES Procedures  ____________________________________________   DIFFERENTIAL DIAGNOSIS  Stroke, urinary tract infection with delirium, cellulitis, embolic arterial occlusion, DVT  CLINICAL IMPRESSION / ASSESSMENT AND PLAN / ED COURSE  Pertinent labs & imaging results that were available during my care of the patient were reviewed by me and considered in my medical decision making (see chart for details).   Patient ill-appearing but with stable vital signs. We'll give IV fluids for hydration while checking labs, x-ray of the chest and left leg, ultrasound of the left leg for DVT, CT of the head to evaluate for evidence of stroke or intracranial hemorrhage. The left leg findings with blue toes, 2 areas of dry gangrene, and the nonhealing wound suggest vascular pathology. I'll start the patient on antibiotics, patient will require admission to the hospital for further evaluation and management.  Clinical Course as of Jan 01 1510  Tue Jan 01, 2017  1510 CT consistent with acute cerebellar stroke. Case discussed with vascular, feels that since he has good DP pulses and 10 toes are affected this is unlikely embolic, unlikely to benefit from heparinization at this time. Plan to give aspirin, case discussed with hospitalist for admission.  [PS]    Clinical Course User Index [PS] Sharman Cheek, MD     ____________________________________________   FINAL CLINICAL IMPRESSION(S) / ED DIAGNOSES    Final diagnoses:  Cerebellar stroke (HCC)  Left leg cellulitis  Encephalopathy acute      This  SmartLink is deprecated. Use AVSMEDLIST instead to display the medication list for a patient.   Portions of this note were generated with dragon dictation software. Dictation errors may occur despite best attempts at proofreading.    Sharman Cheek, MD 01/01/17 [phone]

## 2017-01-01 NOTE — ED Notes (Signed)
Return from u/s.  Pt contused.  Pt waiting on admission

## 2017-01-02 ENCOUNTER — Inpatient Hospital Stay (HOSPITAL_COMMUNITY)
Admit: 2017-01-02 | Discharge: 2017-01-02 | Disposition: A | Discharge status: Home / Self Care | Payer: Medicare Other | Attending: Internal Medicine | Admitting: Internal Medicine

## 2017-01-02 DIAGNOSIS — I499 Cardiac arrhythmia, unspecified: Secondary | ICD-10-CM

## 2017-01-02 DIAGNOSIS — I639 Cerebral infarction, unspecified: Principal | ICD-10-CM

## 2017-01-02 DIAGNOSIS — I361 Nonrheumatic tricuspid (valve) insufficiency: Secondary | ICD-10-CM

## 2017-01-02 LAB — URINE CULTURE: CULTURE: NO GROWTH

## 2017-01-02 LAB — GLUCOSE, CAPILLARY
Glucose-Capillary: 165 mg/dL — ABNORMAL HIGH (ref 65–99)
Glucose-Capillary: 180 mg/dL — ABNORMAL HIGH (ref 65–99)
Glucose-Capillary: 161 mg/dL — ABNORMAL HIGH (ref 65–99)

## 2017-01-02 LAB — CBC
HEMATOCRIT: 35.1 % — AB (ref 40.0–52.0)
HEMOGLOBIN: 12.2 g/dL — AB (ref 13.0–18.0)
MCH: 32.1 pg (ref 26.0–34.0)
MCHC: 34.6 g/dL (ref 32.0–36.0)
MCV: 92.6 fL (ref 80.0–100.0)
Platelets: 280 10*3/uL (ref 150–440)
RBC: 3.79 MIL/uL — AB (ref 4.40–5.90)
RDW: 13.3 % (ref 11.5–14.5)
WBC: 11.7 10*3/uL — AB (ref 3.8–10.6)

## 2017-01-02 LAB — BASIC METABOLIC PANEL
ANION GAP: 11 (ref 5–15)
BUN: 13 mg/dL (ref 6–20)
CALCIUM: 8.5 mg/dL — AB (ref 8.9–10.3)
CO2: 31 mmol/L (ref 22–32)
Chloride: 89 mmol/L — ABNORMAL LOW (ref 101–111)
Creatinine, Ser: 0.68 mg/dL (ref 0.61–1.24)
GLUCOSE: 181 mg/dL — AB (ref 65–99)
POTASSIUM: 3.6 mmol/L (ref 3.5–5.1)
Sodium: 131 mmol/L — ABNORMAL LOW (ref 135–145)

## 2017-01-02 LAB — LIPID PANEL
CHOL/HDL RATIO: 1.9 ratio
CHOLESTEROL: 101 mg/dL (ref 0–200)
HDL: 52 mg/dL (ref >40)
LDL Cholesterol: 41 mg/dL (ref 0–99)
TRIGLYCERIDES: 42 mg/dL (ref <150)
VLDL: 8 mg/dL (ref 0–40)

## 2017-01-02 LAB — HEMOGLOBIN A1C
HEMOGLOBIN A1C: 6.2 % — AB (ref 4.8–5.6)
MEAN PLASMA GLUCOSE: 131.24 mg/dL

## 2017-01-02 MED ORDER — SODIUM CHLORIDE 0.9 % IV SOLN
INTRAVENOUS | Status: DC
  Administered 2017-01-02 – 2017-01-04 (×4): via INTRAVENOUS

## 2017-01-02 MED ORDER — PSYLLIUM 95 % PO PACK
1.0000 | PACK | Freq: Every day | ORAL | Status: DC
  Filled 2017-01-02 (×4): qty 1

## 2017-01-02 MED ORDER — CALCIUM POLYCARBOPHIL 625 MG PO TABS
625.0000 mg | ORAL_TABLET | Freq: Every day | ORAL | Status: DC
  Administered 2017-01-02: 16:00:00 625 mg via ORAL
  Filled 2017-01-02 (×4): qty 1

## 2017-01-02 MED ORDER — DESMOPRESSIN ACETATE 0.83 MCG/0.1ML NA EMUL
1.0000 | Freq: Every evening | NASAL | Status: DC
  Filled 2017-01-02: qty 3.8

## 2017-01-02 MED ORDER — SILVER SULFADIAZINE 1 % EX CREA
TOPICAL_CREAM | Freq: Every day | CUTANEOUS | Status: DC
  Administered 2017-01-02 – 2017-01-04 (×2): via TOPICAL
  Filled 2017-01-02 (×2): qty 85

## 2017-01-02 NOTE — Consult Note (Signed)
Reason for Consult: L cerebellar stroke  Referring Physician: Dr. Cherlynn Kaiser   CC: L cerebellar stroke  HPI: Jeson Camacho is an 81 y.o. male male with a known history of essential hypertension, chronic back pain issues brought in by family because of worsening left leg infection and also confusion.  Patient's daughter patient is having slurred speech for last to 3 days associated with generalized weakness, poor p.o. intake, confusion getting worse. Pt was started on Augmentin for ear infection and also leg infection.  CT of the head done showed low density area in the left cerebellar hemisphere subacute stroke.     Past Medical History:  Diagnosis Date  . Arthritis   . BPH (benign prostatic hyperplasia)   . DDD (degenerative disc disease), cervical   . Hypogonadism in male   . Insomnia   . Neurogenic bladder   . OP (osteoporosis)   . Scoliosis   . Spinal stenosis   . Urinary urgency   . Venous stasis     Past Surgical History:  Procedure Laterality Date  . CERVICAL SPINE SURGERY    . HERNIA REPAIR    . JOINT REPLACEMENT    . LUMBAR SPINE SURGERY    . Proleive System Treatment  2006  . ROTATOR CUFF REPAIR    . THORACIC SPINE SURGERY      Family History  Problem Relation Age of Onset  . Diabetes Father   . Stroke Father   . Alcohol abuse Father   . Diabetes Brother   . Stroke Mother   . Alcohol abuse Brother   . Kidney disease Neg Hx   . Prostate cancer Neg Hx     Social History:  reports that  has never smoked. he has never used smokeless tobacco. He reports that he does not drink alcohol or use drugs.  Allergies  Allergen Reactions  . Amitriptyline   . Nortriptyline Other (See Comments)    GI  upset    Medications: I have reviewed the patient's current medications.  ROS: Unable to obtain as pt is somnolent and poorly responive  Physical Examination: Blood pressure (!) 159/63, pulse 80, temperature 99.6 F (37.6 C), temperature source Oral, resp. rate 16,  height 5\' 9"  (1.753 m), weight 70.4 kg (155 lb 1.6 oz), SpO2 100 %.   Neurological Examination   Mental Status: Alert to name only  Cranial Nerves: II: Discs flat bilaterally; Visual fields grossly normal, pupils equal, round, reactive to light and accommodation III,IV, VI: ptosis not present, extra-ocular motions intact bilaterally V,VII: smile symmetric VIII: hearing normal bilaterally IX,X: not tested  XI: bilateral shoulder shrug XII: midline tongue extension Motor: Generalized weakness but symmetrical bilaterally  Tone and bulk:normal tone throughout; no atrophy noted Sensory: unable to test  Deep Tendon Reflexes: 1+ and symmetric throughout Plantars: Right: downgoing   Left: downgoing Cerebellar: Not tested Gait: not tested    Laboratory Studies:   Basic Metabolic Panel: Recent Labs  Lab 01/01/17 1239 01/02/17 0535  NA 130* 131*  K 4.2 3.6  CL 87* 89*  CO2 31 31  GLUCOSE 165* 181*  BUN 21* 13  CREATININE 0.80 0.68  CALCIUM 9.2 8.5*    Liver Function Tests: Recent Labs  Lab 01/01/17 1239  AST 38  ALT 35  ALKPHOS 110  BILITOT 0.5  PROT 6.9  ALBUMIN 3.1*   No results for input(s): LIPASE, AMYLASE in the last 168 hours. No results for input(s): AMMONIA in the last 168 hours.  CBC: Recent  Labs  Lab 01/01/17 1239 01/02/17 0535  WBC 9.5 11.7*  HGB 13.3 12.2*  HCT 40.1 35.1*  MCV 94.0 92.6  PLT 262 280    Cardiac Enzymes: No results for input(s): CKTOTAL, CKMB, CKMBINDEX, TROPONINI in the last 168 hours.  BNP: Invalid input(s): POCBNP  CBG: Recent Labs  Lab 01/02/17 0750  GLUCAP 165*    Microbiology: Results for orders placed or performed in visit on 04/11/15  Microscopic Examination     Status: Abnormal   Collection Time: 04/11/15  2:55 PM  Result Value Ref Range Status   WBC, UA 0-5 0 - 5 /hpf Final   RBC, UA None seen 0 - 2 /hpf Final   Epithelial Cells (non renal) None seen 0 - 10 /hpf Final   Crystals Present (A) N/A Final    Crystal Type Amorphous Sediment N/A Final   Bacteria, UA Moderate (A) None seen/Few Final   Yeast, UA Present (A) None seen Final  CULTURE, URINE COMPREHENSIVE     Status: None   Collection Time: 04/11/15  4:03 PM  Result Value Ref Range Status   Urine Culture, Comprehensive Final report  Final   Organism ID, Bacteria Comment  Final    Comment: No growth in 36 - 48 hours.    Coagulation Studies: No results for input(s): LABPROT, INR in the last 72 hours.  Urinalysis:  Recent Labs  Lab 01/01/17 1259  COLORURINE YELLOW*  LABSPEC 1.010  PHURINE 6.0  GLUCOSEU NEGATIVE  HGBUR NEGATIVE  BILIRUBINUR NEGATIVE  KETONESUR NEGATIVE  PROTEINUR NEGATIVE  NITRITE NEGATIVE  LEUKOCYTESUR NEGATIVE    Lipid Panel:     Component Value Date/Time   CHOL 101 01/02/2017 0535   TRIG 42 01/02/2017 0535   HDL 52 01/02/2017 0535   CHOLHDL 1.9 01/02/2017 0535   VLDL 8 01/02/2017 0535   LDLCALC 41 01/02/2017 0535    HgbA1C: No results found for: HGBA1C  Urine Drug Screen:  No results found for: LABOPIA, COCAINSCRNUR, LABBENZ, AMPHETMU, THCU, LABBARB  Alcohol Level: No results for input(s): ETH in the last 168 hours. Imaging: Dg Tibia/fibula Left  Result Date: 01/01/2017 CLINICAL DATA:  Generalized weakness.  Left leg swelling EXAM: LEFT TIBIA AND FIBULA - 2 VIEW COMPARISON:  None. FINDINGS: Changes of prior left knee replacement. No hardware complicating feature. No acute bony abnormality. Specifically, no fracture, subluxation, or dislocation. Soft tissues are intact. IMPRESSION: Prior left knee replacement.  No acute bony abnormality. Electronically Signed   By: Charlett NoseKevin  Dover M.D.   On: 01/01/2017 13:32   Ct Head Wo Contrast  Result Date: 01/01/2017 CLINICAL DATA:  Altered level of consciousness. EXAM: CT HEAD WITHOUT CONTRAST TECHNIQUE: Contiguous axial images were obtained from the base of the skull through the vertex without intravenous contrast. COMPARISON:  10/24/2016 FINDINGS:  Brain: Low-density in the left cerebellar hemisphere is new since prior study compatible with acute to subacute infarct. There is atrophy and chronic small vessel disease changes. No hemorrhage. Mild ventricular prominence likely related to ex vacuo dilatation. Vascular: No hyperdense vessel or unexpected calcification. Skull: No acute calvarial abnormality. Sinuses/Orbits: Complete opacification of the left mastoid air cells. Visualized paranasal sinuses clear. Other: None IMPRESSION: New low-density area in the left cerebellar hemisphere compatible with acute to subacute infarction. Atrophy, chronic small vessel disease. Left mastoid effusion, new, question mastoiditis. Electronically Signed   By: Charlett NoseKevin  Dover M.D.   On: 01/01/2017 13:53   [place] Carotid Bilateral (at [facility] And [facility] Only)  Result Date: 01/01/2017 CLINICAL  DATA:  Slurred speech.  Altered mental status. EXAM: BILATERAL CAROTID DUPLEX ULTRASOUND TECHNIQUE: Wallace Cullens scale imaging, color Doppler and duplex ultrasound were performed of bilateral carotid and vertebral arteries in the neck. COMPARISON:  None. FINDINGS: Criteria: Quantification of carotid stenosis is based on velocity parameters that correlate the residual internal carotid diameter with NASCET-based stenosis levels, using the diameter of the distal internal carotid lumen as the denominator for stenosis measurement. The following velocity measurements were obtained: RIGHT ICA:  69/8 cm/sec CCA:  95/13 cm/sec SYSTOLIC ICA/CCA RATIO:  0.7 DIASTOLIC ICA/CCA RATIO:  0.6 ECA:  64 cm/sec LEFT ICA:  79/14 cm/sec CCA:  83/15 cm/sec SYSTOLIC ICA/CCA RATIO:  1.0 DIASTOLIC ICA/CCA RATIO:  1.0 ECA:  93 cm/sec RIGHT CAROTID ARTERY: There is no grayscale evidence of significant intimal thickening or atherosclerotic plaque affecting the interrogated portions of the right carotid system. There are no elevated peak systolic velocities within the interrogated course the right internal carotid artery to suggest a  hemodynamically significant stenosis. RIGHT VERTEBRAL ARTERY:  Antegrade flow LEFT CAROTID ARTERY: There is no grayscale evidence of significant intimal thickening or atherosclerotic plaque affecting interrogated portions of the left carotid system. There are no elevated peak systolic velocities within the interrogated course of the left internal carotid artery to suggest a hemodynamically significant stenosis. LEFT VERTEBRAL ARTERY:  Antegrade flow IMPRESSION: Unremarkable carotid Doppler ultrasound. Electronically Signed   By: Simonne Come M.D.   On: 01/01/2017 17:25   US Venous Img Lower Unilateral Left  Result Date: 01/01/2017 CLINICAL DATA:  Left leg swelling EXAM: LEFT LOWER EXTREMITY VENOUS DOPPLER ULTRASOUND TECHNIQUE: Gray-scale sonography with graded compression, as well as color Doppler and duplex ultrasound were performed to evaluate the lower extremity deep venous systems from the level of the common femoral vein and including the common femoral, femoral, profunda femoral, popliteal and calf veins including the posterior tibial, peroneal and gastrocnemius veins when visible. The superficial great saphenous vein was also interrogated. Spectral Doppler was utilized to evaluate flow at rest and with distal augmentation maneuvers in the common femoral, femoral and popliteal veins. COMPARISON:  None. FINDINGS: Contralateral Common Femoral Vein: Respiratory phasicity is normal and symmetric with the symptomatic side. No evidence of thrombus. Normal compressibility. Common Femoral Vein: No evidence of thrombus. Normal compressibility, respiratory phasicity and response to augmentation. Saphenofemoral Junction: No evidence of thrombus. Normal compressibility and flow on color Doppler imaging. Profunda Femoral Vein: No evidence of thrombus. Normal compressibility and flow on color Doppler imaging. Femoral Vein: No evidence of thrombus. Normal compressibility, respiratory phasicity and response to  augmentation. Popliteal Vein: No evidence of thrombus. Normal compressibility, respiratory phasicity and response to augmentation. Calf Veins: No evidence of thrombus. Normal compressibility and flow on color Doppler imaging. Superficial Great Saphenous Vein: No evidence of thrombus. Normal compressibility. Venous Reflux:  None. Other Findings:  None. IMPRESSION: No evidence of deep venous thrombosis. Electronically Signed   By: Charlett Nose M.D.   On: 01/01/2017 14:33   Dg Chest Portable 1 View  Result Date: 01/01/2017 CLINICAL DATA:  Generalized weakness, confusion EXAM: PORTABLE CHEST 1 VIEW COMPARISON:  None. FINDINGS: Moderate-sized hiatal hernia. Bibasilar atelectasis. Heart is borderline in size. No effusions or acute bony abnormality. IMPRESSION: Borderline heart size.  Bibasilar atelectasis. Moderate-sized hiatal hernia. Electronically Signed   By: Charlett Nose M.D.   On: 01/01/2017 13:32     Assessment/Plan:  81 y.o. male male with a known history of essential hypertension, chronic back pain issues brought in by family because  of worsening left leg infection and also confusion.  Patient's daughter patient is having slurred speech for last to 3 days associated with generalized weakness, poor p.o. intake, confusion getting worse. Pt was started on Augmentin for ear infection and also leg infection.  CT of the head done showed low density area in the left cerebellar hemisphere subacute stroke.  - Not convinced L cerebellar stroke is acute in nature - It is likely incidental finding.     - Pt was started on ASA - I would like MRI when possible to make sure this is not septic emboli which I don't think this is but likely PCA infarct - Infectious treatment as per primary team.  - Again MRI is not urgent and I would hold off to make sure he wakes and able to lay flat protecting airway for prolong period of time prior to sending for MRI as he is somnolent  - Somnolence likely multifactorial and  related to infection rather then stroke. No hx of dysmetria of balance problems as per family.  Pauletta BrownsZEYLIKMAN, Havyn Ramo  01/02/2017, 10:25 AM

## 2017-01-02 NOTE — Evaluation (Addendum)
Clinical/Bedside Swallow Evaluation Patient Details  Name: Dustin Turner MRN: [id number] Date of Birth: 10/05/1935  Today's Date: 01/02/2017 Time: SLP Start Time (ACUTE ONLY): 0830 SLP Stop Time (ACUTE ONLY): 0930 SLP Time Calculation (min) (ACUTE ONLY): 60 min  Past Medical History:  Past Medical History:  Diagnosis Date  . Arthritis   . BPH (benign prostatic hyperplasia)   . DDD (degenerative disc disease), cervical   . Hypogonadism in male   . Insomnia   . Neurogenic bladder   . OP (osteoporosis)   . Scoliosis   . Spinal stenosis   . Urinary urgency   . Venous stasis    Past Surgical History:  Past Surgical History:  Procedure Laterality Date  . CERVICAL SPINE SURGERY    . HERNIA REPAIR    . JOINT REPLACEMENT    . LUMBAR SPINE SURGERY    . Proleive System Treatment  2006  . ROTATOR CUFF REPAIR    . THORACIC SPINE SURGERY     HPI:  Pt is a 81 y.o. male with a known history of essential hypertension, DDD, arthritis, BPH, insomnia, scoliosis, chronic back pain issues d/t spine surgeries on multiple pain medications; OCD and unknown Cognitive status(on Namenda and Aricept meds) brought in by family because of worsening left leg infection and also confusion.  Patient's daughter patient is having slurred speech for last to 3 days associated with generalized weakness, poor p.o. intake, confusion getting worse. No fever. Getting Augmentin for your infection and also leg infection.  Patient has left ear infection and also left leg infection and getting Augmentin for that.  Patient unable to give any history because of confusion, according to patient's family he is very hard of hearing but able to communicate. Noted to have black discoloration of left second and third toes. CT of the head showed low density area in the left cerebellar hemisphere compatible with acute to subacute infarct - Neurology to consult. According to family patient has chronic neck pain and back pain issues, has  myoclonic jerks and patient uses a scooter for mobility at home. And also uses fentanyl lollipops, spinal stimulator for chronic pain.    Assessment / Plan / Recommendation Clinical Impression  Pt presents w/ declined Cognitive status(on aricept and namenda at home though no dx of Dementia is formally written in H&P); wife and dtr described declined Cognitive status at home moreso in past year attempting to get out of bed during the night despite risks(requires night time aide now). Pt also has dx of OCD; he is slow to move forward in tasks per family.  Pt appears to present w/ no gross oropharyngeal phase dysphagia; he presented with adequate oral motor strength and function for bolus management and oral clearing post swallowing. Few missing teeth noted. Family reports no history of swallowing difficulty, but did report less appetite recently. Swallowing appeared timely w/ clear vocal quality noted post trials when he spoke/responded; no decline in respiratory status post trials and no overt coughing noted. Upon presentation of boluses via Spoon, pt required max verbal/tactile cues to open mouth fully for the bolus trials. He exhibited reduced overall awareness for task of oral intake - suspect this is related to the declined Cognitive status(family also stated pt is on multiple pain medications). Discussed how Cognitive decline can impact timing of swallowing which can increase risk for aspiration, choking. As pt has reduced alertness/awareness for task of oral intake(safely), a po diet is not recommended at this time. If oral medications are  necessary, recommend all Pills CRUSHED in Puree for easier, safer swallowing w/ max verbal/tactile cues and 100% supervision(per MD order). Feeding support. Education given to Chubb CorporationWife/Daughter. ST services will f/u tomorrow for ongoing assessment to establish least restrictive diet consistency hopefully.  SLP Visit Diagnosis: Dysphagia, oropharyngeal phase (R13.12)     Aspiration Risk  Mild-Mod aspiration risk d/t Cognitive status/decline   Diet Recommendation  NPO for oral diet; necessary oral medications CRUSHED in Puree w/ NSG supervision for feeding and oral clearing; strict aspiration precautions  Medication Administration: Crushed with puree    Other  Recommendations Recommended Consults: (Dietician f/u) Oral Care Recommendations: Staff/trained caregiver to provide oral care;Oral care QID Other Recommendations: (TBD)   Follow up Recommendations Skilled Nursing facility(TBD)      Frequency and Duration min 3x week  2 weeks       Prognosis Prognosis for Safe Diet Advancement: Fair Barriers to Reach Goals: Cognitive deficits;Severity of deficits;Behavior;Medication(pain meds)      Swallow Study   General Date of Onset: 01/01/17 HPI: Pt is a 81 y.o. male with a known history of essential hypertension, chronic back pain issues brought in by family because of worsening left leg infection and also confusion.  Patient's daughter patient is having slurred speech for last to 3 days associated with generalized weakness, poor p.o. intake, confusion getting worse.  No fever.  Getting Augmentin for your infection and also leg infection.  Patient has left ear infection and also left leg infection and getting Augmentin for that.  Patient unable to give any history because of confusion, according to patient's family he is very hard of hearing but able to communicate well and not confused but they noticed that he has been confused for the past 2 days and has poor p.o. intake.  Noted to have black discoloration of left second and third toes.  According to family back discoloration of her left second and third toes started yesterday.  CT of the head showed low density area in the left cerebellar hemisphere compatible with acute to subacute infarct.  According to family patient has chronic neck pain and back pain issues, has myoclonic jerks and patient  uses a scooter  for mobility at home.  And also has fentanyl lollipops, spinal stimulator for chronic pain.  Type of Study: Bedside Swallow Evaluation Previous Swallow Assessment: none reported Diet Prior to this Study: Regular;Thin liquids(per family) Temperature Spikes Noted: No(wbc 11.7;  temp not elevated around admission) Respiratory Status: Room air History of Recent Intubation: No Behavior/Cognition: Confused;Lethargic/Drowsy;Distractible;Requires cueing;Doesn't follow directions(required verbal/tactile stim; eyes closed mostly) Oral Cavity Assessment: Dry(limited view) Oral Care Completed by SLP: Recent completion by staff Oral Cavity - Dentition: Adequate natural dentition(missing 1-2 teeth) Vision: (n/a) Self-Feeding Abilities: Total assist Patient Positioning: Upright in bed Baseline Vocal Quality: Normal(when he spoke/responded x3) Volitional Cough: Cognitively unable to elicit Volitional Swallow: Unable to elicit    Oral/Motor/Sensory Function Overall Oral Motor/Sensory Function: Within functional limits(w/ bolus management and speech)   Ice Chips Ice chips: Within functional limits Presentation: Spoon(fed; 3 trials) Other Comments: required cues during presentation of boluses but immediately began masticating   Thin Liquid Thin Liquid: Not tested    Nectar Thick Nectar Thick Liquid: Not tested   Honey Thick Honey Thick Liquid: Not tested   Puree Puree: Impaired Presentation: Spoon(fed; ) Oral Phase Impairments: Poor awareness of bolus(during presentation - did not fully clear spoon; accept) Oral Phase Functional Implications: Prolonged oral transit(min; chewing on tablet) Pharyngeal Phase Impairments: (none) Other Comments: required  verbal/tactile cues to open mouth fully for the bolus trials; reduced overall awareness for task of oral intake   Solid   GO   Solid: Not tested    Functional Assessment Tool Used: clinical judgement Functional Limitations: Swallowing Swallow  Current Status ([id number]): At least 40 percent but less than 60 percent impaired, limited or restricted Swallow Goal Status [phone]): At least 1 percent but less than 20 percent impaired, limited or restricted Swallow Discharge Status [phone]): At least 1 percent but less than 20 percent impaired, limited or restricted    Jerilynn Som, MS, CCC-SLP Andree Golphin 01/02/2017,3:12 PM

## 2017-01-02 NOTE — Progress Notes (Signed)
PT Attempt Note  Patient Details Name: Dustin Turner MRN: [id number] DOB: 09/14/1935   Evaluation Attempt:    Reason Eval/Treat Not Completed: Medical issues which prohibited therapy. Order received and chart reviewed. PT evaluation attempted however pt is too lethargic/confused to participate. Pt does not arouse for oral care by RN. Family reports that pt has not been interactive all day. His baseline function is very low and he mostly performs transfers to/from scooter which he uses as primary means for mobility. Family reports that pt has OCD tendencies and takes an extended time to get anything accomplished. It takes him until 2-3:00 in the afternoon to finish his breakfast cereal. Will attempt PT evaluation on later date/time when patient can participate. Family unclear if they would be interested in any PT services following discharge.  Sharalyn InkJason D Aarian Cleaver PT, DPT   Mickie Kozikowski 01/02/2017, 2:26 PM

## 2017-01-02 NOTE — Progress Notes (Signed)
OT Cancellation Note  Patient Details Name: Dustin LainJohn David Turner MRN: [id number] DOB: 02/02/1935   Cancelled Treatment:    Reason Eval/Treat Not Completed: Fatigue/lethargy limiting ability to participate(Pt. unable to arouse in order to attend to, or engage in OT initial evaluation. Will continue to monitor, and eval when appropriate.)  [name], MS, OTR/L 01/02/2017, 2:04 PM

## 2017-01-02 NOTE — Progress Notes (Signed)
SLP Cancellation Note  Patient Details Name: Dustin Turner MRN: [id number] DOB: 10/19/1935   Cancelled treatment:       Reason Eval/Treat Not Completed: Fatigue/lethargy limiting ability to participate;Patient not medically ready(chart reviewed; ST services will f/u when appropriate). Education w/ family; it appears pt may have baseline Cognitive decline (is on Aricept and Namenda at home).     Jerilynn SomKatherine Watson, MS, CCC-SLP Watson,Katherine 01/02/2017, 3:35 PM

## 2017-01-02 NOTE — Progress Notes (Signed)
[employer] - [place] [facility]   PATIENT NAME: Twana FirstJohn Stenseth    MR#:  [id number]  DATE OF BIRTH:  03/20/1935  SUBJECTIVE:   Patient here due to altered mental status/encephalopathy.  Seen this morning and remains somewhat lethargic/encephalopathic and does not follow any commands. Minimally responsive to sternal rub. Family at bedside  REVIEW OF SYSTEMS:    Review of Systems  Unable to perform ROS: Mental acuity    Nutrition: NPO except meds Tolerating Diet: No Tolerating PT: Await Eval.   DRUG ALLERGIES:   Allergies  Allergen Reactions  . Amitriptyline   . Nortriptyline Other (See Comments)    GI  upset    VITALS:  Blood pressure 138/62, pulse 74, temperature 99.8 F (37.7 C), temperature source Oral, resp. rate 16, height 5\' 9"  (1.753 m), weight 70.4 kg (155 lb 1.6 oz), SpO2 98 %.  PHYSICAL EXAMINATION:   Physical Exam  GENERAL:  81 y.o.-year-old patient lying in bed lethargic/encephalopathic.  EYES: Pupils equal, round, reactive to light. No scleral icterus. Extraocular muscles intact.  HEENT: Head atraumatic, normocephalic. Oropharynx and nasopharynx clear.  NECK:  Supple, no jugular venous distention. No thyroid enlargement, no tenderness.  LUNGS: Normal breath sounds bilaterally, no wheezing, rales, rhonchi. No use of accessory muscles of respiration.  CARDIOVASCULAR: S1, S2 normal. No murmurs, rubs, or gallops.  ABDOMEN: Soft, nontender, nondistended. Bowel sounds present. No organomegaly or mass.  EXTREMITIES: No cyanosis, clubbing or edema b/l.   Left lower extremity redness, swelling consistent with mild cellulitis. There is a small area on the left posterior calf with a skin tear. NEUROLOGIC: Cranial nerves II through XII are intact. No focal Motor or sensory deficits b/l. Encephalopathic   PSYCHIATRIC: The patient is alert and oriented x 1.  Encephalopathic.  SKIN: No obvious rash, lesion, or ulcer.    LABORATORY PANEL:   CBC Recent  Labs  Lab 01/02/17 0535  WBC 11.7*  HGB 12.2*  HCT 35.1*  PLT 280   ------------------------------------------------------------------------------------------------------------------  Chemistries  Recent Labs  Lab 01/01/17 1239 01/02/17 0535  NA 130* 131*  K 4.2 3.6  CL 87* 89*  CO2 31 31  GLUCOSE 165* 181*  BUN 21* 13  CREATININE 0.80 0.68  CALCIUM 9.2 8.5*  AST 38  --   ALT 35  --   ALKPHOS 110  --   BILITOT 0.5  --    ------------------------------------------------------------------------------------------------------------------  Cardiac Enzymes No results for input(s): TROPONINI in the last 168 hours. ------------------------------------------------------------------------------------------------------------------  RADIOLOGY:  Dg Tibia/fibula Left  Result Date: 01/01/2017 CLINICAL DATA:  Generalized weakness.  Left leg swelling EXAM: LEFT TIBIA AND FIBULA - 2 VIEW COMPARISON:  None. FINDINGS: Changes of prior left knee replacement. No hardware complicating feature. No acute bony abnormality. Specifically, no fracture, subluxation, or dislocation. Soft tissues are intact. IMPRESSION: Prior left knee replacement.  No acute bony abnormality. Electronically Signed   By: Charlett NoseKevin  Dover M.D.   On: 01/01/2017 13:32   Ct Head Wo Contrast  Result Date: 01/01/2017 CLINICAL DATA:  Altered level of consciousness. EXAM: CT HEAD WITHOUT CONTRAST TECHNIQUE: Contiguous axial images were obtained from the base of the skull through the vertex without intravenous contrast. COMPARISON:  10/24/2016 FINDINGS: Brain: Low-density in the left cerebellar hemisphere is new since prior study compatible with acute to subacute infarct. There is atrophy and chronic small vessel disease changes. No hemorrhage. Mild ventricular prominence likely related to ex vacuo dilatation. Vascular: No hyperdense vessel or unexpected calcification. Skull: No acute calvarial  abnormality. Sinuses/Orbits: Complete  opacification of the left mastoid air cells. Visualized paranasal sinuses clear. Other: None IMPRESSION: New low-density area in the left cerebellar hemisphere compatible with acute to subacute infarction. Atrophy, chronic small vessel disease. Left mastoid effusion, new, question mastoiditis. Electronically Signed   By: Charlett Nose M.D.   On: 01/01/2017 13:53   US Carotid Bilateral (at [facility] And [facility] Only)  Result Date: 01/01/2017 CLINICAL DATA:  Slurred speech.  Altered mental status. EXAM: BILATERAL CAROTID DUPLEX ULTRASOUND TECHNIQUE: Wallace Cullens scale imaging, color Doppler and duplex ultrasound were performed of bilateral carotid and vertebral arteries in the neck. COMPARISON:  None. FINDINGS: Criteria: Quantification of carotid stenosis is based on velocity parameters that correlate the residual internal carotid diameter with NASCET-based stenosis levels, using the diameter of the distal internal carotid lumen as the denominator for stenosis measurement. The following velocity measurements were obtained: RIGHT ICA:  69/8 cm/sec CCA:  95/13 cm/sec SYSTOLIC ICA/CCA RATIO:  0.7 DIASTOLIC ICA/CCA RATIO:  0.6 ECA:  64 cm/sec LEFT ICA:  79/14 cm/sec CCA:  83/15 cm/sec SYSTOLIC ICA/CCA RATIO:  1.0 DIASTOLIC ICA/CCA RATIO:  1.0 ECA:  93 cm/sec RIGHT CAROTID ARTERY: There is no grayscale evidence of significant intimal thickening or atherosclerotic plaque affecting the interrogated portions of the right carotid system. There are no elevated peak systolic velocities within the interrogated course the right internal carotid artery to suggest a hemodynamically significant stenosis. RIGHT VERTEBRAL ARTERY:  Antegrade flow LEFT CAROTID ARTERY: There is no grayscale evidence of significant intimal thickening or atherosclerotic plaque affecting interrogated portions of the left carotid system. There are no elevated peak systolic velocities within the interrogated course of the left internal carotid artery to suggest a  hemodynamically significant stenosis. LEFT VERTEBRAL ARTERY:  Antegrade flow IMPRESSION: Unremarkable carotid Doppler ultrasound. Electronically Signed   By: Simonne Come M.D.   On: 01/01/2017 17:25   US Venous Img Lower Unilateral Left  Result Date: 01/01/2017 CLINICAL DATA:  Left leg swelling EXAM: LEFT LOWER EXTREMITY VENOUS DOPPLER ULTRASOUND TECHNIQUE: Gray-scale sonography with graded compression, as well as color Doppler and duplex ultrasound were performed to evaluate the lower extremity deep venous systems from the level of the common femoral vein and including the common femoral, femoral, profunda femoral, popliteal and calf veins including the posterior tibial, peroneal and gastrocnemius veins when visible. The superficial great saphenous vein was also interrogated. Spectral Doppler was utilized to evaluate flow at rest and with distal augmentation maneuvers in the common femoral, femoral and popliteal veins. COMPARISON:  None. FINDINGS: Contralateral Common Femoral Vein: Respiratory phasicity is normal and symmetric with the symptomatic side. No evidence of thrombus. Normal compressibility. Common Femoral Vein: No evidence of thrombus. Normal compressibility, respiratory phasicity and response to augmentation. Saphenofemoral Junction: No evidence of thrombus. Normal compressibility and flow on color Doppler imaging. Profunda Femoral Vein: No evidence of thrombus. Normal compressibility and flow on color Doppler imaging. Femoral Vein: No evidence of thrombus. Normal compressibility, respiratory phasicity and response to augmentation. Popliteal Vein: No evidence of thrombus. Normal compressibility, respiratory phasicity and response to augmentation. Calf Veins: No evidence of thrombus. Normal compressibility and flow on color Doppler imaging. Superficial Great Saphenous Vein: No evidence of thrombus. Normal compressibility. Venous Reflux:  None. Other Findings:  None. IMPRESSION: No evidence of deep  venous thrombosis. Electronically Signed   By: Charlett Nose M.D.   On: 01/01/2017 14:33   Dg Chest Portable 1 View  Result Date: 01/01/2017 CLINICAL DATA:  Generalized weakness, confusion EXAM: PORTABLE CHEST  1 VIEW COMPARISON:  None. FINDINGS: Moderate-sized hiatal hernia. Bibasilar atelectasis. Heart is borderline in size. No effusions or acute bony abnormality. IMPRESSION: Borderline heart size.  Bibasilar atelectasis. Moderate-sized hiatal hernia. Electronically Signed   By: Charlett NoseKevin  Dover M.D.   On: 01/01/2017 13:32     ASSESSMENT AND PLAN:   81 year old male with past medical history of chronic pain, spinal stenosis, scoliosis, neurogenic bladder, degenerative disc disease, BPH, osteoarthritis who presented to the hospital due to altered mental status.   1. Altered mental status-etiology unclear presently. Patient on CT head was noted to have an acute/subacute cerebellar CVA but it's unclear patient's mental status changes related to that. No evidence of urinary tract infection or any other source of her is mental status change presently. -Patient is on high-dose pain medications due to his chronic back pain and possibly could be related to that but none of his medications have been recently adjusted. -We'll prefer to get MRI of the patient's brain but given his spinal stimulator we cannot do that. Consider doing a repeat CT next 24-48 hours. Follow mental status.  2. Acute/subacute CVA-patient on CT head on admission noted to have a cerebellar CVA.  - Appreciate neurology input. Continue aspirin, cannot do MRI given the spinal stimulator. -Carotid duplex negative for acute pathology. Await echocardiogram results. -Await PT/OT evaluation. Appreciate speech eval patient is a lethargic and keep nothing by mouth for now.  3. Left lower extreme cellulitis-continue IV vancomycin. Follow cultures.  4. History of spinal stenosis/chronic pain-patient has a spinal stimulator with a Dilaudid  infusion. Continue baclofen, Neurontin, Cymbalta -Patient follows up at the pain Center.  5. Restless leg syndrome-continue Requip.  6. Dementia-continue Namenda.  7. Anxiety-continue Klonopin.  All the records are reviewed and case discussed with Care Management/Social Worker. Management plans discussed with the patient, family and they are in agreement.  CODE STATUS: DNR  DVT Prophylaxis: Lovenox  TOTAL TIME TAKING CARE OF THIS PATIENT: 35 minutes.   POSSIBLE D/C IN 1-2 DAYS, DEPENDING ON CLINICAL CONDITION.   Houston SirenSAINANI,Juliannah Ohmann J M.D on 01/02/2017 at 3:46 PM  Between 7am to 6pm - Pager - [phone]  After 6pm go to [email or web address] - Social research officer, governmentpassword [email or web address] [email or web address]  [facility] [place] [facility]  Office  [phone]  CC: Primary care physician; Orene DesanctisBehling, Karen, MD   7. History of seizures-continue IV Keppra.

## 2017-01-03 ENCOUNTER — Inpatient Hospital Stay: Payer: Medicare Other

## 2017-01-03 DIAGNOSIS — G9340 Encephalopathy, unspecified: Secondary | ICD-10-CM

## 2017-01-03 LAB — BASIC METABOLIC PANEL
ANION GAP: 10 (ref 5–15)
ANION GAP: 3 — AB (ref 5–15)
BUN: 18 mg/dL (ref 6–20)
BUN: 18 mg/dL (ref 6–20)
CALCIUM: 8.4 mg/dL — AB (ref 8.9–10.3)
CO2: 32 mmol/L (ref 22–32)
CO2: 31 mmol/L (ref 22–32)
Calcium: 7.6 mg/dL — ABNORMAL LOW (ref 8.9–10.3)
Chloride: 92 mmol/L — ABNORMAL LOW (ref 101–111)
Chloride: 93 mmol/L — ABNORMAL LOW (ref 101–111)
Creatinine, Ser: 0.69 mg/dL (ref 0.61–1.24)
Creatinine, Ser: 0.76 mg/dL (ref 0.61–1.24)
GFR calc Af Amer: >60 mL/min (ref >60)
GFR calc Af Amer: >60 mL/min (ref >60)
GLUCOSE: 151 mg/dL — AB (ref 65–99)
Glucose, Bld: 138 mg/dL — ABNORMAL HIGH (ref 65–99)
POTASSIUM: 3.3 mmol/L — AB (ref 3.5–5.1)
POTASSIUM: 3.8 mmol/L (ref 3.5–5.1)
SODIUM: 134 mmol/L — AB (ref 135–145)
Sodium: 127 mmol/L — ABNORMAL LOW (ref 135–145)

## 2017-01-03 LAB — GLUCOSE, CAPILLARY: GLUCOSE-CAPILLARY: 133 mg/dL — AB (ref 65–99)

## 2017-01-03 LAB — VANCOMYCIN, TROUGH: Vancomycin Tr: 4 ug/mL — ABNORMAL LOW (ref 15–20)

## 2017-01-03 LAB — LIPID PANEL
CHOL/HDL RATIO: 2.2 ratio
Cholesterol: 97 mg/dL (ref 0–200)
HDL: 44 mg/dL (ref >40)
LDL CALC: 46 mg/dL (ref 0–99)
Triglycerides: 33 mg/dL (ref <150)
VLDL: 7 mg/dL (ref 0–40)

## 2017-01-03 LAB — MAGNESIUM
MAGNESIUM: 1.9 mg/dL (ref 1.7–2.4)
Magnesium: 1.8 mg/dL (ref 1.7–2.4)

## 2017-01-03 LAB — TROPONIN I
TROPONIN I: 0.33 ng/mL — AB (ref <0.03)
TROPONIN I: 0.26 ng/mL — AB (ref <0.03)
Troponin I: 0.41 ng/mL (ref <0.03)

## 2017-01-03 LAB — ECHOCARDIOGRAM COMPLETE
Height: 69 in
Weight: 2481.6 oz

## 2017-01-03 MED ORDER — ATORVASTATIN CALCIUM 20 MG PO TABS: 20.0000 mg | ORAL_TABLET | Freq: Every day | ORAL | Status: DC

## 2017-01-03 MED ORDER — ASPIRIN EC 81 MG PO TBEC
81.0000 mg | DELAYED_RELEASE_TABLET | Freq: Every day | ORAL | Status: DC
  Filled 2017-01-03: qty 1

## 2017-01-03 MED ORDER — SODIUM CHLORIDE 0.9 % IV SOLN
500.0000 mg | Freq: Two times a day (BID) | INTRAVENOUS | Status: DC
  Administered 2017-01-04 (×2): 500 mg via INTRAVENOUS
  Filled 2017-01-03 (×3): qty 5

## 2017-01-03 MED ORDER — LORAZEPAM 2 MG/ML IJ SOLN
INTRAMUSCULAR | Status: AC
  Administered 2017-01-03: 2 mg via INTRAVENOUS
  Filled 2017-01-03: qty 1

## 2017-01-03 MED ORDER — METRONIDAZOLE IN NACL 5-0.79 MG/ML-% IV SOLN
500.0000 mg | Freq: Four times a day (QID) | INTRAVENOUS | Status: DC
  Administered 2017-01-04 (×4): 500 mg via INTRAVENOUS
  Filled 2017-01-03 (×7): qty 100

## 2017-01-03 MED ORDER — DOCUSATE SODIUM 50 MG/5ML PO LIQD
100.0000 mg | Freq: Two times a day (BID) | ORAL | Status: DC
  Administered 2017-01-04: 01:00:00 100 mg via ORAL
  Filled 2017-01-03 (×5): qty 10

## 2017-01-03 MED ORDER — LORAZEPAM 2 MG/ML IJ SOLN
2.0000 mg | INTRAMUSCULAR | Status: DC | PRN
  Administered 2017-01-03 (×2): 2 mg via INTRAVENOUS
  Filled 2017-01-03 (×2): qty 1

## 2017-01-03 MED ORDER — SODIUM CHLORIDE 0.9 % IV SOLN
1000.0000 mg | Freq: Once | INTRAVENOUS | Status: AC
  Administered 2017-01-03: 16:00:00 1000 mg via INTRAVENOUS
  Filled 2017-01-03: qty 10

## 2017-01-03 MED ORDER — HEPARIN (PORCINE) IN NACL 100-0.45 UNIT/ML-% IJ SOLN: 10.0000 [IU]/kg/h | INTRAMUSCULAR | Status: DC

## 2017-01-03 MED ORDER — HALOPERIDOL LACTATE 5 MG/ML IJ SOLN
2.5000 mg | Freq: Four times a day (QID) | INTRAMUSCULAR | Status: DC | PRN
  Administered 2017-01-03 – 2017-01-04 (×6): 2.5 mg via INTRAVENOUS
  Filled 2017-01-03 (×5): qty 1

## 2017-01-03 MED ORDER — METOPROLOL TARTRATE 25 MG PO TABS
12.5000 mg | ORAL_TABLET | Freq: Two times a day (BID) | ORAL | Status: DC
  Administered 2017-01-03 – 2017-01-04 (×2): 12.5 mg via ORAL
  Filled 2017-01-03 (×4): qty 1

## 2017-01-03 MED ORDER — DEXTROSE 5 % IV SOLN
2.0000 g | Freq: Two times a day (BID) | INTRAVENOUS | Status: DC
  Administered 2017-01-04 (×2): 2 g via INTRAVENOUS
  Filled 2017-01-03 (×5): qty 2

## 2017-01-03 MED ORDER — IOPAMIDOL (ISOVUE-370) INJECTION 76%
75.0000 mL | Freq: Once | INTRAVENOUS | Status: AC | PRN
  Administered 2017-01-03: 17:00:00 75 mL via INTRAVENOUS

## 2017-01-03 MED ORDER — NITROGLYCERIN 0.4 MG SL SUBL: 0.4000 mg | SUBLINGUAL_TABLET | SUBLINGUAL | Status: DC | PRN

## 2017-01-03 MED ORDER — ASPIRIN EC 325 MG PO TBEC: 325.0000 mg | DELAYED_RELEASE_TABLET | Freq: Every day | ORAL | Status: DC

## 2017-01-03 MED ORDER — HEPARIN (PORCINE) IN NACL 100-0.45 UNIT/ML-% IJ SOLN
1500.0000 [IU]/h | INTRAMUSCULAR | Status: DC
  Administered 2017-01-04: 02:00:00 1300 [IU]/h via INTRAVENOUS
  Administered 2017-01-04: 1500 [IU]/h via INTRAVENOUS
  Filled 2017-01-03 (×2): qty 250

## 2017-01-03 MED ORDER — HEPARIN BOLUS VIA INFUSION
4400.0000 [IU] | Freq: Once | INTRAVENOUS | Status: AC
  Administered 2017-01-04: 02:00:00 4400 [IU] via INTRAVENOUS
  Filled 2017-01-03: qty 4400

## 2017-01-03 MED ORDER — LEVETIRACETAM 500 MG/5ML IV SOLN: 500.0000 mg | Freq: Two times a day (BID) | INTRAVENOUS | Status: DC

## 2017-01-03 NOTE — Progress Notes (Addendum)
Case discussed with radiology, CT angiogram noted for left transverse/sigmoid sinus thrombosis, left IJ thrombosis, probable intracranial abscess, will start heparin drip, decrease aspirin to 81 mg daily, discontinue Lovenox, discontinue vancomycin, start empiric Flagyl/Rocephin, ID consult in am, d/w neurosurgery - medical management.

## 2017-01-03 NOTE — Progress Notes (Signed)
Pt extremely agitated. Trying to get out of bed. Took 4 people to keep him from getting up. Given Haldol to calm him.

## 2017-01-03 NOTE — Progress Notes (Addendum)
Subjective: Patient with shaking episode overnight that was questioned as seizure.  Given Ativan with resolution.  From review of chart patient with history of nocturnal shaking episodes.  Unclear if these are seizure or not.  Patient was started on Keppra but patient is no longer on this.      Objective: Current vital signs: BP 140/66 (BP Location: Right Arm)   Pulse 72   Temp 99 F (37.2 C) (Axillary)   Resp 18   Ht 5\' 9"  (1.753 m)   Wt 71.4 kg (157 lb 4.8 oz)   SpO2 99%   BMI 23.23 kg/m  Vital signs in last 24 hours: Temp:  [98.1 F (36.7 C)-102.9 F (39.4 C)] 99 F (37.2 C) (12/20 0850) Pulse Rate:  [69-90] 72 (12/20 0850) Resp:  [16-18] 18 (12/20 0850) BP: (138-168)/(62-110) 140/66 (12/20 0850) SpO2:  [92 %-100 %] 99 % (12/20 0850) Weight:  [71.4 kg (157 lb 4.8 oz)] 71.4 kg (157 lb 4.8 oz) (12/20 0351)  Intake/Output from previous day: 12/19 0701 - 12/20 0700 In: 898.3 [I.V.:648.3; IV Piggyback:250] Out: 1100 [Urine:1100] Intake/Output this shift: No intake/output data recorded. Nutritional status: Diet NPO time specified Except for: Other (See Comments)  Neurologic Exam: Mental Status: Patient does not respond to verbal stimuli.  Only responds to deep sternal rub with extensive stimulation by opening eyes.  Does not follow commands.  No verbalizations noted.  Cranial Nerves: II: patient does not respond confrontation bilaterally, pupils right 3 mm, left 3 mm,and reactive bilaterally III,IV,VI: dysconjugate eye movements with no movement of right eye beyond midline to the right.  V,VII: decrease in left NLF VIII: patient does not respond to verbal stimuli IX,X: gag reflex unable to test, XI: trapezius strength unable to test bilaterally XII: tongue strength unable to test Motor: Patient with some movement noted in the RUE, less movement of the LUE.  No spontaneous movement of either lower extremity Sensory: Responds with grimace to noxious stimuli on the right and  not on the left   Lab Results: Basic Metabolic Panel: Recent Labs  Lab 01/01/17 1239 01/02/17 0535 01/03/17 0000 01/03/17 0537  NA 130* 131* 127* 134*  K 4.2 3.6 3.3* 3.8  CL 87* 89* 93* 92*  CO2 31 31 31  32  GLUCOSE 165* 181* 151* 138*  BUN 21* 13 18 18   CREATININE 0.80 0.68 0.76 0.69  CALCIUM 9.2 8.5* 7.6* 8.4*  MG  --   --  1.8 1.9    Liver Function Tests: Recent Labs  Lab 01/01/17 1239  AST 38  ALT 35  ALKPHOS 110  BILITOT 0.5  PROT 6.9  ALBUMIN 3.1*   No results for input(s): LIPASE, AMYLASE in the last 168 hours. No results for input(s): AMMONIA in the last 168 hours.  CBC: Recent Labs  Lab 01/01/17 1239 01/02/17 0535  WBC 9.5 11.7*  HGB 13.3 12.2*  HCT 40.1 35.1*  MCV 94.0 92.6  PLT 262 280    Cardiac Enzymes: Recent Labs  Lab 01/03/17 0000 01/03/17 0537  TROPONINI 0.41* 0.33*    Lipid Panel: Recent Labs  Lab 01/02/17 0535 01/03/17 0537  CHOL 101 97  TRIG 42 33  HDL 52 44  CHOLHDL 1.9 2.2  VLDL 8 7  LDLCALC 41 46    CBG: Recent Labs  Lab 01/02/17 0750 01/02/17 1157 01/02/17 1703 01/03/17 0757  GLUCAP 165* 180* 161* 133*    Microbiology: Results for orders placed or performed during the hospital encounter of 01/01/17  Urine culture  Status: None   Collection Time: 01/01/17 12:59 PM  Result Value Ref Range Status   Specimen Description URINE, RANDOM  Final   Special Requests NONE  Final   Culture   Final    NO GROWTH Performed at [facility], [address] [place], [place] [id number]    Report Status 01/02/2017 FINAL  Final    Coagulation Studies: No results for input(s): LABPROT, INR in the last 72 hours.  Imaging: Dg Tibia/fibula Left  Result Date: 01/01/2017 CLINICAL DATA:  Generalized weakness.  Left leg swelling EXAM: LEFT TIBIA AND FIBULA - 2 VIEW COMPARISON:  None. FINDINGS: Changes of prior left knee replacement. No hardware complicating feature. No acute bony abnormality. Specifically, no  fracture, subluxation, or dislocation. Soft tissues are intact. IMPRESSION: Prior left knee replacement.  No acute bony abnormality. Electronically Signed   By: Charlett NoseKevin  Dover M.D.   On: 01/01/2017 13:32   Ct Head Wo Contrast  Result Date: 01/01/2017 CLINICAL DATA:  Altered level of consciousness. EXAM: CT HEAD WITHOUT CONTRAST TECHNIQUE: Contiguous axial images were obtained from the base of the skull through the vertex without intravenous contrast. COMPARISON:  10/24/2016 FINDINGS: Brain: Low-density in the left cerebellar hemisphere is new since prior study compatible with acute to subacute infarct. There is atrophy and chronic small vessel disease changes. No hemorrhage. Mild ventricular prominence likely related to ex vacuo dilatation. Vascular: No hyperdense vessel or unexpected calcification. Skull: No acute calvarial abnormality. Sinuses/Orbits: Complete opacification of the left mastoid air cells. Visualized paranasal sinuses clear. Other: None IMPRESSION: New low-density area in the left cerebellar hemisphere compatible with acute to subacute infarction. Atrophy, chronic small vessel disease. Left mastoid effusion, new, question mastoiditis. Electronically Signed   By: Charlett NoseKevin  Dover M.D.   On: 01/01/2017 13:53   [place] Carotid Bilateral (at [facility] Only)  Result Date: 01/01/2017 CLINICAL DATA:  Slurred speech.  Altered mental status. EXAM: BILATERAL CAROTID DUPLEX ULTRASOUND TECHNIQUE: Wallace CullensGray scale imaging, color Doppler and duplex ultrasound were performed of bilateral carotid and vertebral arteries in the neck. COMPARISON:  None. FINDINGS: Criteria: Quantification of carotid stenosis is based on velocity parameters that correlate the residual internal carotid diameter with NASCET-based stenosis levels, using the diameter of the distal internal carotid lumen as the denominator for stenosis measurement. The following velocity measurements were obtained: RIGHT ICA:  69/8 cm/sec CCA:  95/13 cm/sec  SYSTOLIC ICA/CCA RATIO:  0.7 DIASTOLIC ICA/CCA RATIO:  0.6 ECA:  64 cm/sec LEFT ICA:  79/14 cm/sec CCA:  83/15 cm/sec SYSTOLIC ICA/CCA RATIO:  1.0 DIASTOLIC ICA/CCA RATIO:  1.0 ECA:  93 cm/sec RIGHT CAROTID ARTERY: There is no grayscale evidence of significant intimal thickening or atherosclerotic plaque affecting the interrogated portions of the right carotid system. There are no elevated peak systolic velocities within the interrogated course the right internal carotid artery to suggest a hemodynamically significant stenosis. RIGHT VERTEBRAL ARTERY:  Antegrade flow LEFT CAROTID ARTERY: There is no grayscale evidence of significant intimal thickening or atherosclerotic plaque affecting interrogated portions of the left carotid system. There are no elevated peak systolic velocities within the interrogated course of the left internal carotid artery to suggest a hemodynamically significant stenosis. LEFT VERTEBRAL ARTERY:  Antegrade flow IMPRESSION: Unremarkable carotid Doppler ultrasound. Electronically Signed   By: Simonne ComeJohn  Watts M.D.   On: 01/01/2017 17:25   [place] Venous Img Lower Unilateral Left  Result Date: 01/01/2017 CLINICAL DATA:  Left leg swelling EXAM: LEFT LOWER EXTREMITY VENOUS DOPPLER ULTRASOUND TECHNIQUE: Gray-scale sonography with  graded compression, as well as color Doppler and duplex ultrasound were performed to evaluate the lower extremity deep venous systems from the level of the common femoral vein and including the common femoral, femoral, profunda femoral, popliteal and calf veins including the posterior tibial, peroneal and gastrocnemius veins when visible. The superficial great saphenous vein was also interrogated. Spectral Doppler was utilized to evaluate flow at rest and with distal augmentation maneuvers in the common femoral, femoral and popliteal veins. COMPARISON:  None. FINDINGS: Contralateral Common Femoral Vein: Respiratory phasicity is normal and symmetric with the symptomatic side.  No evidence of thrombus. Normal compressibility. Common Femoral Vein: No evidence of thrombus. Normal compressibility, respiratory phasicity and response to augmentation. Saphenofemoral Junction: No evidence of thrombus. Normal compressibility and flow on color Doppler imaging. Profunda Femoral Vein: No evidence of thrombus. Normal compressibility and flow on color Doppler imaging. Femoral Vein: No evidence of thrombus. Normal compressibility, respiratory phasicity and response to augmentation. Popliteal Vein: No evidence of thrombus. Normal compressibility, respiratory phasicity and response to augmentation. Calf Veins: No evidence of thrombus. Normal compressibility and flow on color Doppler imaging. Superficial Great Saphenous Vein: No evidence of thrombus. Normal compressibility. Venous Reflux:  None. Other Findings:  None. IMPRESSION: No evidence of deep venous thrombosis. Electronically Signed   By: Charlett Nose M.D.   On: 01/01/2017 14:33   Dg Chest Portable 1 View  Result Date: 01/01/2017 CLINICAL DATA:  Generalized weakness, confusion EXAM: PORTABLE CHEST 1 VIEW COMPARISON:  None. FINDINGS: Moderate-sized hiatal hernia. Bibasilar atelectasis. Heart is borderline in size. No effusions or acute bony abnormality. IMPRESSION: Borderline heart size.  Bibasilar atelectasis. Moderate-sized hiatal hernia. Electronically Signed   By: Charlett Nose M.D.   On: 01/01/2017 13:32    Medications:  I have reviewed the patient's current medications. Scheduled: . aspirin EC  325 mg Oral Daily  . atorvastatin  20 mg Oral q1800  . baclofen  10 mg Oral TID  . chlorzoxazone  750 mg Oral Daily  . clonazePAM  0.5 mg Oral BID  . cycloSPORINE  1 drop Both Eyes BID  . Desmopressin Acetate  1 spray Nasal QPM  . docusate  100 mg Oral BID  . donepezil  10 mg Oral QHS  . DULoxetine  30 mg Oral Daily  . enoxaparin (LOVENOX) injection  40 mg Subcutaneous Q24H  . fluticasone  2 spray Each Nare Daily  . gabapentin  400  mg Oral QID  .  HYDROmorphone (DILAUDID) injection  1 mg Intravenous Once  . linaclotide  145 mcg Oral QAC breakfast  . magnesium oxide  400 mg Oral Daily  . memantine  21 mg Oral Daily  . metoprolol tartrate  12.5 mg Oral BID  . mirabegron ER  50 mg Oral Daily  . multivitamin with minerals  1 tablet Oral Daily  . ofloxacin  5 drop Left EAR Daily  . polycarbophil  625 mg Oral Daily  . psyllium  1 packet Oral Daily  . rOPINIRole  0.25 mg Oral QHS  . senna-docusate  1 tablet Oral BID  . silver sulfADIAZINE   Topical Daily  . tiZANidine  2 mg Oral TID    Assessment/Plan: Patient remains poorly responsive.  Has received Ativan and muscle relaxers/pain medications.  Restarted on Keppra.  Etiology of mental status remains unclear and may be related to infection, medications, stroke and/or seizures.  Suspect etiology is multifactorial.  Head CT reviewed and although possible cerebellar infarct unable to be dated it was not present  on the CT in October and patient with some focal findings on neurological examination.  MRI of the brain unable to be performed due to stimulator so can not rule out further posterior circulation involvement.  Carotid dopplers show no evidence of hemodynamically significant stenosis.  Echocardiogram shows no cardiac source of emboli with an EF of 65-70%.  A1c 6.2, LDL 41.  Recommendations: 1. EEG 2. Agree with ASA 3. Hold sedating medications not given through pump and prn Ativan 4. Based on results of EEG will determine need for increase in AED therapy 5. Agree with treatment of  Infection 6. CTA of head and neck    LOS: 2 days   Thana FarrLeslie Eleyna Brugh, MD Neurology [phone] 01/03/2017  10:48 AM

## 2017-01-03 NOTE — Progress Notes (Signed)
Pt returned from EEG. Ativan given per Dr. Jarrett Ablesrders.

## 2017-01-03 NOTE — Progress Notes (Signed)
[facility] [place] [facility]   PATIENT NAME: Dustin Turner    MR#:  [id number]  DATE OF BIRTH:  01/03/1936  SUBJECTIVE:   Pt. This morning was still quite lethargic and encephalopathic. Patient apparently this morning had a rapid response called as on telemetry he was noted to have some ventricular tachycardia and a suspected seizure and given some Ativan. Troponins were only mildly elevated. Cardiology consult pending.  REVIEW OF SYSTEMS:    Review of Systems  Unable to perform ROS: Mental acuity    Nutrition: NPO except meds Tolerating Diet: No Tolerating PT: Await Eval.   DRUG ALLERGIES:   Allergies  Allergen Reactions  . Amitriptyline   . Nortriptyline Other (See Comments)    GI  upset    VITALS:  Blood pressure (!) 160/79, pulse 82, temperature 99 F (37.2 C), temperature source Oral, resp. rate 20, height 5\' 9"  (1.753 m), weight 71.4 kg (157 lb 4.8 oz), SpO2 98 %.  PHYSICAL EXAMINATION:   Physical Exam  GENERAL:  81 y.o.-year-old patient lying in bed lethargic/encephalopathic and barely responds to sternal rub.  EYES: Pupils equal, round, reactive to light. No scleral icterus. Extraocular muscles intact.  HEENT: Head atraumatic, normocephalic. Oropharynx and nasopharynx clear.  NECK:  Supple, no jugular venous distention. No thyroid enlargement, no tenderness.  LUNGS: Normal breath sounds bilaterally, no wheezing, rales, rhonchi. No use of accessory muscles of respiration.  CARDIOVASCULAR: S1, S2 normal. No murmurs, rubs, or gallops.  ABDOMEN: Soft, nontender, nondistended. Bowel sounds present. No organomegaly or mass.  EXTREMITIES: No cyanosis, clubbing or edema b/l.   Left lower extremity redness, swelling consistent with mild cellulitis. There is a small area on the left posterior calf with a skin tear. NEUROLOGIC: Encephalopathic and difficult to assess.  PSYCHIATRIC: Encephalopathic.  SKIN: No obvious rash, lesion, or ulcer.     LABORATORY PANEL:   CBC Recent Labs  Lab 01/02/17 0535  WBC 11.7*  HGB 12.2*  HCT 35.1*  PLT 280   ------------------------------------------------------------------------------------------------------------------  Chemistries  Recent Labs  Lab 01/01/17 1239  01/03/17 0537  NA 130*   < > 134*  K 4.2   < > 3.8  CL 87*   < > 92*  CO2 31   < > 32  GLUCOSE 165*   < > 138*  BUN 21*   < > 18  CREATININE 0.80   < > 0.69  CALCIUM 9.2   < > 8.4*  MG  --    < > 1.9  AST 38  --   --   ALT 35  --   --   ALKPHOS 110  --   --   BILITOT 0.5  --   --    < > = values in this interval not displayed.   ------------------------------------------------------------------------------------------------------------------  Cardiac Enzymes Recent Labs  Lab 01/03/17 1117  TROPONINI 0.26*   ------------------------------------------------------------------------------------------------------------------  RADIOLOGY:  [place] Carotid Bilateral (at [facility])  Result Date: 01/01/2017 CLINICAL DATA:  Slurred speech.  Altered mental status. EXAM: BILATERAL CAROTID DUPLEX ULTRASOUND TECHNIQUE: Wallace CullensGray scale imaging, color Doppler and duplex ultrasound were performed of bilateral carotid and vertebral arteries in the neck. COMPARISON:  None. FINDINGS: Criteria: Quantification of carotid stenosis is based on velocity parameters that correlate the residual internal carotid diameter with NASCET-based stenosis levels, using the diameter of the distal internal carotid lumen as the denominator for stenosis measurement. The following velocity measurements were obtained: RIGHT ICA:  69/8 cm/sec CCA:  95/13 cm/sec SYSTOLIC ICA/CCA RATIO:  0.7 DIASTOLIC ICA/CCA RATIO:  0.6 ECA:  64 cm/sec LEFT ICA:  79/14 cm/sec CCA:  83/15 cm/sec SYSTOLIC ICA/CCA RATIO:  1.0 DIASTOLIC ICA/CCA RATIO:  1.0 ECA:  93 cm/sec RIGHT CAROTID ARTERY: There is no grayscale evidence of significant intimal thickening or atherosclerotic  plaque affecting the interrogated portions of the right carotid system. There are no elevated peak systolic velocities within the interrogated course the right internal carotid artery to suggest a hemodynamically significant stenosis. RIGHT VERTEBRAL ARTERY:  Antegrade flow LEFT CAROTID ARTERY: There is no grayscale evidence of significant intimal thickening or atherosclerotic plaque affecting interrogated portions of the left carotid system. There are no elevated peak systolic velocities within the interrogated course of the left internal carotid artery to suggest a hemodynamically significant stenosis. LEFT VERTEBRAL ARTERY:  Antegrade flow IMPRESSION: Unremarkable carotid Doppler ultrasound. Electronically Signed   By: Simonne ComeJohn  Watts M.D.   On: 01/01/2017 17:25     ASSESSMENT AND PLAN:   81 year old male with past medical history of chronic pain, spinal stenosis, scoliosis, neurogenic bladder, degenerative disc disease, BPH, osteoarthritis who presented to the hospital due to altered mental status.   1. Altered mental status-etiology unclear presently. Patient on CT head was noted to have an acute/subacute cerebellar CVA but it's unclear that patient's mental status changes related to that. No evidence of urinary tract infection or any other source of mental status change presently.  - Appreciate neurology input and we'll continue further stroke workup but CTA of the head and neck. -Patient apparently thought to have seizures during his EEG today. As per neurology will load with IV Keppra and increased the dose of his Keppra. -We will also continue to hold his when necessary Dilaudid and Vicodin. Continue to follow mental status. Use Haldol as needed for agitation.  2. Acute/subacute CVA-patient on CT head on admission noted to have a cerebellar CVA.  - Appreciate neurology input. Continue aspirin, cannot do MRI given the spinal stimulator. -Carotid duplex negative for acute pathology. Echo showing  normal ejection fraction with no thrombus. -Appreciate neurology consult and await CTA of the head and neck to rule out posterior circulation issues that could be contributing to his mental status change and suspected CVA. -Continue aspirin  3. Left lower extreme cellulitis-continue IV vancomycin and clinically improving.   4. History of spinal stenosis/chronic pain-patient has a spinal stimulator with a Dilaudid infusion. Continue baclofen, Neurontin, Cymbalta - hold PRN Vicodin, Dilaudid until mental status improves.   5. Restless leg syndrome- continue Requip.  6. Dementia- continue Aricept, Namenda.  7. Anxiety-continue Klonopin.  All the records are reviewed and case discussed with Care Management/Social Worker. Management plans discussed with the patient, family and they are in agreement.  CODE STATUS: DNR  DVT Prophylaxis: Lovenox  TOTAL TIME TAKING CARE OF THIS PATIENT: 30 minutes.   POSSIBLE D/C IN 1-2 DAYS, DEPENDING ON CLINICAL CONDITION.   Houston SirenSAINANI,Quentavious Rittenhouse J M.D on 01/03/2017 at 3:29 PM  Between 7am to 6pm - Pager - [phone]  After 6pm go to [email or web address] - Social research officer, governmentpassword [email or web address] [email or web address]  [facility] [place] [facility]  Office  [phone]  CC: Primary care physician; Orene DesanctisBehling, Karen, MD   7. History of seizures-continue IV Keppra.

## 2017-01-03 NOTE — Progress Notes (Signed)
ANTICOAGULATION CONSULT NOTE - Initial Consult  Pharmacy Consult for Heparin  Indication: DVT  Allergies  Allergen Reactions  . Amitriptyline   . Nortriptyline Other (See Comments)    GI  upset    Patient Measurements: Height: 5\' 9"  (175.3 cm) Weight: 157 lb 4.8 oz (71.4 kg) IBW/kg (Calculated) : 70.7 Heparin Dosing Weight: 74.8 kg   Vital Signs: Temp: 99.1 F (37.3 C) (12/20 1928) Temp Source: Oral (12/20 1928) BP: 135/69 (12/20 1928) Pulse Rate: 70 (12/20 1928)  Labs: Recent Labs    01/01/17 1239 01/02/17 0535 01/03/17 0000 01/03/17 0537 01/03/17 1117  HGB 13.3 12.2*  --   --   --   HCT 40.1 35.1*  --   --   --   PLT 262 280  --   --   --   CREATININE 0.80 0.68 0.76 0.69  --   TROPONINI  --   --  0.41* 0.33* 0.26*    Estimated Creatinine Clearance: 74.9 mL/min (by C-G formula based on SCr of 0.69 mg/dL).   Medical History: Past Medical History:  Diagnosis Date  . Arthritis   . BPH (benign prostatic hyperplasia)   . DDD (degenerative disc disease), cervical   . Hypogonadism in male   . Insomnia   . Neurogenic bladder   . OP (osteoporosis)   . Scoliosis   . Spinal stenosis   . Urinary urgency   . Venous stasis     Medications:  Medications Prior to Admission  Medication Sig Dispense Refill Last Dose  . alendronate (FOSAMAX) 70 MG tablet    Past Week at Unknown time  . amoxicillin-clavulanate (AUGMENTIN) 875-125 MG tablet Take 1 tablet by mouth 2 (two) times daily.  0 12/31/2016 at Unknown time  . Ascorbic Acid (VITAMIN C) 1000 MG tablet Take 460 mg by mouth.   12/31/2016 at Unknown time  . baclofen (LIORESAL) 10 MG tablet Take 10 mg by mouth 3 (three) times daily.   12/31/2016 at Unknown time  . Biotin (SUPER BIOTIN) 5 MG TABS 10,000 mg.   12/31/2016 at Unknown time  . Calcium Polycarbophil (FIBER-CAPS PO) Take 8 capsules by mouth daily.   12/31/2016 at Unknown time  . calcium-vitamin D (CALCIUM 500/D) 500-200 MG-UNIT per tablet Take by mouth.    12/31/2016 at Unknown time  . Chlorzoxazone (LORZONE) 750 MG TABS Take 1 tablet by mouth daily. Reported on 04/11/2015   12/31/2016 at Unknown time  . clonazePAM (KLONOPIN) 0.5 MG tablet Take 0.5 mg by mouth 2 (two) times daily.    12/31/2016 at Unknown time  . cycloSPORINE (RESTASIS) 0.05 % ophthalmic emulsion    12/31/2016 at Unknown time  . Desmopressin Acetate (NOCTIVA) 0.83 MCG/0.1ML EMUL Place 1 Squirt into the nose daily. 1 Bottle 3 12/31/2016 at Unknown time  . diclofenac sodium (VOLTAREN) 1 % GEL Apply topically.   12/31/2016 at Unknown time  . docusate sodium (COLACE) 100 MG capsule Take 100 mg by mouth daily.   12/31/2016 at Unknown time  . donepezil (ARICEPT) 10 MG tablet Take 10 mg by mouth at bedtime.    12/31/2016 at Unknown time  . DULoxetine (CYMBALTA) 60 MG capsule Take 30 mg by mouth daily.   12/31/2016 at Unknown time  . eszopiclone (LUNESTA) 2 MG TABS tablet take 1 tablet as needed for sleep  0 12/31/2016 at Unknown time  . fentaNYL (ACTIQ) 800 MCG lollipop Reported on 04/11/2015   01/01/2017 at Unknown time  . Fish Oil-Cholecalciferol (FISH OIL + D3) 1000-1000  MG-UNIT CAPS Take by mouth.   12/31/2016 at Unknown time  . fluticasone (FLONASE) 50 MCG/ACT nasal spray Place 2 sprays into both nostrils daily.   12/31/2016 at Unknown time  . gabapentin (NEURONTIN) 400 MG capsule Take 400 mg by mouth 4 (four) times daily.    12/31/2016 at Unknown time  . HYDROmorphone, PF, (DILAUDID HP) 250 MG injection Inject 10 mg as directed continuously.    01/01/2017 at Unknown time  . L-Methylfolate-Algae-B12-B6 (METANX) 3-90.314-2-35 MG CAPS Reported on 04/11/2015   12/31/2016 at Unknown time  . l-methylfolate-B6-B12 (METANX) 3-35-2 MG TABS tablet Take 1 tablet by mouth daily.   12/31/2016 at Unknown time  . levETIRAcetam (KEPPRA) 250 MG tablet Take 250 mg by mouth 2 (two) times daily.   12/31/2016 at Unknown time  . LINZESS 290 MCG CAPS capsule    12/31/2016 at Unknown time  . loratadine  (CLARITIN) 10 MG tablet Take 10 mg by mouth daily.   12/31/2016 at Unknown time  . Magnesium 250 MG TABS Take 1 tablet by mouth daily.   12/31/2016 at Unknown time  . memantine (NAMENDA TITRATION PACK) tablet pack Take by mouth daily.    12/31/2016 at Unknown time  . methylphenidate (RITALIN) 10 MG tablet Take 10 mg by mouth 3 (three) times daily with meals.    12/31/2016 at Unknown time  . mirabegron ER (MYRBETRIQ) 50 MG TB24 tablet Take 1 tablet (50 mg total) by mouth daily. 90 tablet 3 12/31/2016 at Unknown time  . MOVANTIK 12.5 MG TABS tablet Take 12.5 mg by mouth daily.    12/31/2016 at Unknown time  . Multiple Vitamin (MULTIVITAMIN) capsule Take 1 capsule by mouth daily.   12/31/2016 at Unknown time  . mupirocin ointment (BACTROBAN) 2 % Apply 1 application topically 3 (three) times daily.  1 12/31/2016 at Unknown time  . ofloxacin (FLOXIN) 0.3 % OTIC solution Place 5 drops into the left ear 2 (two) times daily.  0 12/31/2016 at Unknown time  . rOPINIRole (REQUIP) 0.25 MG tablet Take 0.25 mg by mouth at bedtime.   12/31/2016 at Unknown time  . Testosterone 20.25 MG/ACT (1.62%) GEL Place 1 Pump onto the skin daily.    12/31/2016 at Unknown time  . tizanidine (ZANAFLEX) 2 MG capsule Take 1 capsule by mouth 3 (three) times daily.   12/31/2016 at Unknown time  . Insulin Pen Needle (PEN NEEDLES 31GX5/16") 31G X 8 MM MISC 1 each by Does not apply route.   Not Taking  . NARCAN 4 MG/0.1ML LIQD Reported on 04/11/2015  0 prn at prn    Assessment: Pharmacy consulted to dose heparin in this 81 year old male with VTE. CrCl = 74.9 ml/min   Goal of Therapy:  Heparin level 0.3-0.7 units/ml Monitor platelets by anticoagulation protocol: Yes   Plan:  Give 4400  units bolus x 1 Start heparin infusion at 1300  units/hr Check anti-Xa level in 8  hours and daily while on heparin Continue to monitor H&H and platelets  Dustin Turner D 01/03/2017,9:06 PM

## 2017-01-03 NOTE — Progress Notes (Signed)
PT Cancellation Note  Patient Details Name: Dustin LainJohn David Turner MRN: [id number] DOB: 07/18/1935   Cancelled Treatment:    Reason Eval/Treat Not Completed: Medical issues which prohibited therapy. Chart reviewed and RN consulted. Pt with multiple medical complaints and is not stable today for PT evaluation today. Will attempt PT evaluation on later date/time as pt is appropriate.  Sharalyn InkJason D Shannara Winbush PT, DPT   Sharnell Knight 01/03/2017, 1:39 PM

## 2017-01-03 NOTE — Consult Note (Signed)
Reason for Consult: Abnormal EKG altered mental status recent CVA Referring Physician: Dr Jerelyn Charles hospitalist  Dustin Turner is an 81 y.o. male.  HPI: Patient presents with altered mental status lethargy confusion weakness fatigue CT of the head suggestive of subacute cerebellar infarct.  Patient has history of hypertension chronic back and neck issues cellulitis of the leg presented with confusion over the last few days decreased p.o. intake and generalized fatigue patient was brought to the emergency room and then subsequently admitted after abnormal CT of the head patient also was found to have abnormal EKG. patient is on multiple pain management for chronic pain syndrome including fentanyl patch fentanyl lollipops spinal stimulator.  Patient was then referred to cardiology for further assessment with neurological symptoms including possible seizures  Past Medical History:  Diagnosis Date  . Arthritis   . BPH (benign prostatic hyperplasia)   . DDD (degenerative disc disease), cervical   . Hypogonadism in male   . Insomnia   . Neurogenic bladder   . OP (osteoporosis)   . Scoliosis   . Spinal stenosis   . Urinary urgency   . Venous stasis     Past Surgical History:  Procedure Laterality Date  . CERVICAL SPINE SURGERY    . HERNIA REPAIR    . JOINT REPLACEMENT    . LUMBAR SPINE SURGERY    . Proleive System Treatment  2006  . ROTATOR CUFF REPAIR    . THORACIC SPINE SURGERY      Family History  Problem Relation Age of Onset  . Diabetes Father   . Stroke Father   . Alcohol abuse Father   . Diabetes Brother   . Stroke Mother   . Alcohol abuse Brother   . Kidney disease Neg Hx   . Prostate cancer Neg Hx     Social History:  reports that  has never smoked. he has never used smokeless tobacco. He reports that he does not drink alcohol or use drugs.  Allergies:  Allergies  Allergen Reactions  . Amitriptyline   . Nortriptyline Other (See Comments)    GI  upset     Medications: See previous listed medications  Results for orders placed or performed during the hospital encounter of 01/01/17 (from the past 48 hour(s))  Basic metabolic panel     Status: Abnormal   Collection Time: 01/02/17  5:35 AM  Result Value Ref Range   Sodium 131 (L) 135 - 145 mmol/L   Potassium 3.6 3.5 - 5.1 mmol/L   Chloride 89 (L) 101 - 111 mmol/L   CO2 31 22 - 32 mmol/L   Glucose, Bld 181 (H) 65 - 99 mg/dL   BUN 13 6 - 20 mg/dL   Creatinine, Ser 0.68 0.61 - 1.24 mg/dL   Calcium 8.5 (L) 8.9 - 10.3 mg/dL   GFR calc non Af Amer >60 >60 mL/min   GFR calc Af Amer >60 >60 mL/min    Comment: (NOTE) The eGFR has been calculated using the CKD EPI equation. This calculation has not been validated in all clinical situations. eGFR's persistently <60 mL/min signify possible Chronic Kidney Disease.    Anion gap 11 5 - 15  CBC     Status: Abnormal   Collection Time: 01/02/17  5:35 AM  Result Value Ref Range   WBC 11.7 (H) 3.8 - 10.6 K/uL   RBC 3.79 (L) 4.40 - 5.90 MIL/uL   Hemoglobin 12.2 (L) 13.0 - 18.0 g/dL   HCT 35.1 (L) 40.0 -  52.0 %   MCV 92.6 80.0 - 100.0 fL   MCH 32.1 26.0 - 34.0 pg   MCHC 34.6 32.0 - 36.0 g/dL   RDW 13.3 11.5 - 14.5 %   Platelets 280 150 - 440 K/uL  Hemoglobin A1c     Status: Abnormal   Collection Time: 01/02/17  5:35 AM  Result Value Ref Range   Hgb A1c MFr Bld 6.2 (H) 4.8 - 5.6 %    Comment: (NOTE) Pre diabetes:          5.7%-6.4% Diabetes:              >6.4% Glycemic control for   <7.0% adults with diabetes    Mean Plasma Glucose 131.24 mg/dL    Comment: Performed at [facility], [place] [address] [place], [place] [address]  Lipid panel     Status: None   Collection Time: 01/02/17  5:35 AM  Result Value Ref Range   Cholesterol 101 0 - 200 mg/dL   Triglycerides 42 <150 mg/dL   HDL 52 >40 mg/dL   Total CHOL/HDL Ratio 1.9 RATIO   VLDL 8 0 - 40 mg/dL   LDL Cholesterol 41 0 - 99 mg/dL    Comment:        Total Cholesterol/HDL:CHD  Risk Coronary Heart Disease Risk Table                     Men   Women  1/2 Average Risk   3.4   3.3  Average Risk       5.0   4.4  2 X Average Risk   9.6   7.1  3 X Average Risk  23.4   11.0        Use the calculated Patient Ratio above and the CHD Risk Table to determine the patient's CHD Risk.        ATP III CLASSIFICATION (LDL):  <100     mg/dL   Optimal  100-129  mg/dL   Near or Above                    Optimal  130-159  mg/dL   Borderline  160-189  mg/dL   High  >190     mg/dL   Very High   Glucose, capillary     Status: Abnormal   Collection Time: 01/02/17  7:50 AM  Result Value Ref Range   Glucose-Capillary 165 (H) 65 - 99 mg/dL  Glucose, capillary     Status: Abnormal   Collection Time: 01/02/17 11:57 AM  Result Value Ref Range   Glucose-Capillary 180 (H) 65 - 99 mg/dL  Glucose, capillary     Status: Abnormal   Collection Time: 01/02/17  5:03 PM  Result Value Ref Range   Glucose-Capillary 161 (H) 65 - 99 mg/dL  Troponin I (q 6hr x 3)     Status: Abnormal   Collection Time: 01/03/17 12:00 AM  Result Value Ref Range   Troponin I 0.41 (HH) <0.03 ng/mL    Comment: CRITICAL RESULT CALLED TO, READ BACK BY AND VERIFIED WITH [facility] AT 9323 01/03/17 ALV   Basic metabolic panel     Status: Abnormal   Collection Time: 01/03/17 12:00 AM  Result Value Ref Range   Sodium 127 (L) 135 - 145 mmol/L   Potassium 3.3 (L) 3.5 - 5.1 mmol/L   Chloride 93 (L) 101 - 111 mmol/L   CO2 31 22 - 32 mmol/L  Glucose, Bld 151 (H) 65 - 99 mg/dL   BUN 18 6 - 20 mg/dL   Creatinine, Ser 0.76 0.61 - 1.24 mg/dL   Calcium 7.6 (L) 8.9 - 10.3 mg/dL   GFR calc non Af Amer >60 >60 mL/min   GFR calc Af Amer >60 >60 mL/min    Comment: (NOTE) The eGFR has been calculated using the CKD EPI equation. This calculation has not been validated in all clinical situations. eGFR's persistently <60 mL/min signify possible Chronic Kidney Disease.    Anion gap 3 (L) 5 - 15  Magnesium     Status:  None   Collection Time: 01/03/17 12:00 AM  Result Value Ref Range   Magnesium 1.8 1.7 - 2.4 mg/dL  Basic metabolic panel     Status: Abnormal   Collection Time: 01/03/17  5:37 AM  Result Value Ref Range   Sodium 134 (L) 135 - 145 mmol/L   Potassium 3.8 3.5 - 5.1 mmol/L   Chloride 92 (L) 101 - 111 mmol/L   CO2 32 22 - 32 mmol/L   Glucose, Bld 138 (H) 65 - 99 mg/dL   BUN 18 6 - 20 mg/dL   Creatinine, Ser 0.69 0.61 - 1.24 mg/dL   Calcium 8.4 (L) 8.9 - 10.3 mg/dL   GFR calc non Af Amer >60 >60 mL/min   GFR calc Af Amer >60 >60 mL/min    Comment: (NOTE) The eGFR has been calculated using the CKD EPI equation. This calculation has not been validated in all clinical situations. eGFR's persistently <60 mL/min signify possible Chronic Kidney Disease.    Anion gap 10 5 - 15  Magnesium     Status: None   Collection Time: 01/03/17  5:37 AM  Result Value Ref Range   Magnesium 1.9 1.7 - 2.4 mg/dL  Troponin I (q 6hr x 3)     Status: Abnormal   Collection Time: 01/03/17  5:37 AM  Result Value Ref Range   Troponin I 0.33 (HH) <0.03 ng/mL    Comment: CRITICAL RESULT CALLED TO, READ BACK BY AND VERIFIED WITH  SYLVIA FUENTES AT 7035 01/03/17 SDR   Lipid panel     Status: None   Collection Time: 01/03/17  5:37 AM  Result Value Ref Range   Cholesterol 97 0 - 200 mg/dL   Triglycerides 33 <150 mg/dL   HDL 44 >40 mg/dL   Total CHOL/HDL Ratio 2.2 RATIO   VLDL 7 0 - 40 mg/dL   LDL Cholesterol 46 0 - 99 mg/dL    Comment:        Total Cholesterol/HDL:CHD Risk Coronary Heart Disease Risk Table                     Men   Women  1/2 Average Risk   3.4   3.3  Average Risk       5.0   4.4  2 X Average Risk   9.6   7.1  3 X Average Risk  23.4   11.0        Use the calculated Patient Ratio above and the CHD Risk Table to determine the patient's CHD Risk.        ATP III CLASSIFICATION (LDL):  <100     mg/dL   Optimal  100-129  mg/dL   Near or Above                    Optimal  130-159  mg/dL  Borderline  160-189  mg/dL   High  >190     mg/dL   Very High   Glucose, capillary     Status: Abnormal   Collection Time: 01/03/17  7:57 AM  Result Value Ref Range   Glucose-Capillary 133 (H) 65 - 99 mg/dL  Troponin I (q 6hr x 3)     Status: Abnormal   Collection Time: 01/03/17 11:17 AM  Result Value Ref Range   Troponin I 0.26 (HH) <0.03 ng/mL    Comment: CRITICAL VALUE NOTED. VALUE IS CONSISTENT WITH PREVIOUSLY REPORTED/CALLED VALUE JJB  Vancomycin, trough     Status: Abnormal   Collection Time: 01/03/17  7:10 PM  Result Value Ref Range   Vancomycin Tr 4 (L) 15 - 20 ug/mL    Ct Angio Head W Or Wo Contrast  Result Date: 01/03/2017 CLINICAL DATA:  Initial evaluation for acute encephalopathy. Stroke. EXAM: CT ANGIOGRAPHY HEAD AND NECK TECHNIQUE: Multidetector CT imaging of the head and neck was performed using the standard protocol during bolus administration of intravenous contrast. Multiplanar CT image reconstructions and MIPs were obtained to evaluate the vascular anatomy. Carotid stenosis measurements (when applicable) are obtained utilizing NASCET criteria, using the distal internal carotid diameter as the denominator. CONTRAST:  32m ISOVUE-370 IOPAMIDOL (ISOVUE-370) INJECTION 76% COMPARISON:  Prior head CT from 01/01/2017. FINDINGS: CT HEAD FINDINGS Brain: Cerebral volume within normal limits for age. Evolving hypodensity within the left cerebellar hemisphere again seen. Minimal internal hyperdensity suggestive of possible petechial hemorrhage without frank hemorrhagic transformation. No significant mass effect. No other evidence for acute intracranial hemorrhage. No other large vessel territory infarct. No mass lesion or midline shift. Diffuse ventricular prominence with colpocephaly, stable. No frank hydrocephalus. No extra-axial fluid collection. Vascular: No hyperdense vessel. Scattered vascular calcifications noted within the carotid siphons. Skull: Scalp soft tissues within normal  limits.  Calvarium intact. Sinuses: Paranasal sinuses are clear. Left greater than right mastoid effusions. Orbits: Globes and orbital soft tissues within normal limits. Review of the MIP images confirms the above findings CTA NECK FINDINGS Aortic arch: Visualized aortic arch of normal caliber with normal branch pattern. Mild plaque within the arch itself. No flow-limiting stenosis about the origin of the great vessels. Partially visualized subclavian artery is widely patent. Right carotid system: Right common and internal carotid artery is widely patent without stenosis, dissection, or occlusion. No atheromatous narrowing about the right carotid bifurcation. Left carotid system: Left common and internal carotid arteries are widely patent without stenosis, dissection, or occlusion. Minimal atheromatous plaque about the left bifurcation without stenosis. Vertebral arteries: Both of the vertebral arteries arise from the subclavian arteries. Left vertebral artery dominant. Vertebral arteries widely patent within the neck without stenosis, dissection, or occlusion. Skeleton: No acute osseus abnormality. No worrisome lytic or blastic osseous lesions. Reversal of the normal cervical lordosis with sequelae of prior fusion at C4 through C7. Advanced degenerative spondylolysis present at C7-T1 and T1-T2. Other neck: No other acute soft tissue abnormality within the neck. No adenopathy. Salivary glands normal. Thyroid normal. Upper chest: Visualized upper mediastinum within normal limits. Layering bilateral pleural effusions with associated atelectasis, slightly larger on the left. Partially visualized lungs are otherwise grossly clear. Review of the MIP images confirms the above findings CTA HEAD FINDINGS Anterior circulation: Petrous segments widely patent bilaterally. Mild atheromatous plaque within the cavernous ICAs without flow-limiting stenosis. ICA termini widely patent. A1 segments patent. Normal anterior  communicating artery. Anterior cerebral arteries patent to their distal aspects without stenosis. M1 segments patent  without stenosis or occlusion. No proximal M2 occlusion. Distal MCA branches well perfused and symmetric. Posterior circulation: Dominant left vertebral artery widely patent to the vertebrobasilar junction. Hypoplastic right vertebral artery patent as well without stenosis. Right PICA patent. Left PICA not well visualized. Basilar artery mildly tortuous but widely patent to its distal aspect. Superior cerebral arteries patent bilaterally. Left PCA supplied via the basilar. Predominant fetal type right PCA supplied via a widely patent right posterior communicating artery. PCAs widely patent to their distal aspects without stenosis. Venous sinuses: Abnormal filling defect present within knee left transverse and sigmoid sinuses, extending into the proximal left internal jugular vein, suspicious for venous sinus thrombosis. Name dural sinuses are otherwise patent. This is likely unchanged relative to recent CT from 01/01/2017, but appears new relative to prior CT from 10/24/2016. Anatomic variants: Fetal type right PCA. No aneurysm or vascular abnormality. Delayed phase: Hypodensity within the left cerebellar hemisphere demonstrates no significant enhancement. There is question of adjacent 2 cm rim enhancing hypodensity about the petrous aspect of the left temporal bone (series 16, image 5). Dural sinus thrombosis involving the left transverse and sigmoid sinuses again noted. No other abnormal enhancement. Review of the MIP images confirms the above findings IMPRESSION: 1. Overall findings suggestive of acute left otomastoiditis complicated by dural venous thrombosis involving the left transverse and sigmoid sinuses as well as the left internal jugular vein. Adjacent hypodensity within the left cerebellar hemisphere may reflect associated venous infarct or possibly focal encephalitis. 2. Additional  approximate 2 cm rim enhancing intracranial hypodensity positioned along the left petrous ridge at the level of the left temporal bone, also suspicious for associated encephalitis/intracranial infection. Possible abscess may be present. 3. Otherwise negative CTA of the head and neck. 4. Bilateral pleural effusions with associated atelectasis. Electronically Signed   By: Jeannine Boga M.D.   On: 01/03/2017 19:59   Ct Angio Neck W Or Wo Contrast  Result Date: 01/03/2017 CLINICAL DATA:  Initial evaluation for acute encephalopathy. Stroke. EXAM: CT ANGIOGRAPHY HEAD AND NECK TECHNIQUE: Multidetector CT imaging of the head and neck was performed using the standard protocol during bolus administration of intravenous contrast. Multiplanar CT image reconstructions and MIPs were obtained to evaluate the vascular anatomy. Carotid stenosis measurements (when applicable) are obtained utilizing NASCET criteria, using the distal internal carotid diameter as the denominator. CONTRAST:  7m ISOVUE-370 IOPAMIDOL (ISOVUE-370) INJECTION 76% COMPARISON:  Prior head CT from 01/01/2017. FINDINGS: CT HEAD FINDINGS Brain: Cerebral volume within normal limits for age. Evolving hypodensity within the left cerebellar hemisphere again seen. Minimal internal hyperdensity suggestive of possible petechial hemorrhage without frank hemorrhagic transformation. No significant mass effect. No other evidence for acute intracranial hemorrhage. No other large vessel territory infarct. No mass lesion or midline shift. Diffuse ventricular prominence with colpocephaly, stable. No frank hydrocephalus. No extra-axial fluid collection. Vascular: No hyperdense vessel. Scattered vascular calcifications noted within the carotid siphons. Skull: Scalp soft tissues within normal limits.  Calvarium intact. Sinuses: Paranasal sinuses are clear. Left greater than right mastoid effusions. Orbits: Globes and orbital soft tissues within normal limits. Review  of the MIP images confirms the above findings CTA NECK FINDINGS Aortic arch: Visualized aortic arch of normal caliber with normal branch pattern. Mild plaque within the arch itself. No flow-limiting stenosis about the origin of the great vessels. Partially visualized subclavian artery is widely patent. Right carotid system: Right common and internal carotid artery is widely patent without stenosis, dissection, or occlusion. No atheromatous narrowing about the right carotid  bifurcation. Left carotid system: Left common and internal carotid arteries are widely patent without stenosis, dissection, or occlusion. Minimal atheromatous plaque about the left bifurcation without stenosis. Vertebral arteries: Both of the vertebral arteries arise from the subclavian arteries. Left vertebral artery dominant. Vertebral arteries widely patent within the neck without stenosis, dissection, or occlusion. Skeleton: No acute osseus abnormality. No worrisome lytic or blastic osseous lesions. Reversal of the normal cervical lordosis with sequelae of prior fusion at C4 through C7. Advanced degenerative spondylolysis present at C7-T1 and T1-T2. Other neck: No other acute soft tissue abnormality within the neck. No adenopathy. Salivary glands normal. Thyroid normal. Upper chest: Visualized upper mediastinum within normal limits. Layering bilateral pleural effusions with associated atelectasis, slightly larger on the left. Partially visualized lungs are otherwise grossly clear. Review of the MIP images confirms the above findings CTA HEAD FINDINGS Anterior circulation: Petrous segments widely patent bilaterally. Mild atheromatous plaque within the cavernous ICAs without flow-limiting stenosis. ICA termini widely patent. A1 segments patent. Normal anterior communicating artery. Anterior cerebral arteries patent to their distal aspects without stenosis. M1 segments patent without stenosis or occlusion. No proximal M2 occlusion. Distal MCA  branches well perfused and symmetric. Posterior circulation: Dominant left vertebral artery widely patent to the vertebrobasilar junction. Hypoplastic right vertebral artery patent as well without stenosis. Right PICA patent. Left PICA not well visualized. Basilar artery mildly tortuous but widely patent to its distal aspect. Superior cerebral arteries patent bilaterally. Left PCA supplied via the basilar. Predominant fetal type right PCA supplied via a widely patent right posterior communicating artery. PCAs widely patent to their distal aspects without stenosis. Venous sinuses: Abnormal filling defect present within knee left transverse and sigmoid sinuses, extending into the proximal left internal jugular vein, suspicious for venous sinus thrombosis. Name dural sinuses are otherwise patent. This is likely unchanged relative to recent CT from 01/01/2017, but appears new relative to prior CT from 10/24/2016. Anatomic variants: Fetal type right PCA. No aneurysm or vascular abnormality. Delayed phase: Hypodensity within the left cerebellar hemisphere demonstrates no significant enhancement. There is question of adjacent 2 cm rim enhancing hypodensity about the petrous aspect of the left temporal bone (series 16, image 5). Dural sinus thrombosis involving the left transverse and sigmoid sinuses again noted. No other abnormal enhancement. Review of the MIP images confirms the above findings IMPRESSION: 1. Overall findings suggestive of acute left otomastoiditis complicated by dural venous thrombosis involving the left transverse and sigmoid sinuses as well as the left internal jugular vein. Adjacent hypodensity within the left cerebellar hemisphere may reflect associated venous infarct or possibly focal encephalitis. 2. Additional approximate 2 cm rim enhancing intracranial hypodensity positioned along the left petrous ridge at the level of the left temporal bone, also suspicious for associated  encephalitis/intracranial infection. Possible abscess may be present. 3. Otherwise negative CTA of the head and neck. 4. Bilateral pleural effusions with associated atelectasis. Electronically Signed   By: Jeannine Boga M.D.   On: 01/03/2017 19:59    Review of Systems  Constitutional: Positive for malaise/fatigue.  HENT: Positive for congestion.   Eyes: Negative.   Respiratory: Negative.   Cardiovascular: Positive for palpitations.  Gastrointestinal: Negative.   Genitourinary: Negative.   Musculoskeletal: Positive for myalgias.  Skin: Negative.   Neurological: Positive for dizziness, loss of consciousness, weakness and headaches.  Endo/Heme/Allergies: Negative.   Psychiatric/Behavioral: Negative.    Blood pressure 135/69, pulse 70, temperature 99.1 F (37.3 C), temperature source Oral, resp. rate 20, height '5\' 9"'$  (1.753 m), weight  157 lb 4.8 oz (71.4 kg), SpO2 99 %. Physical Exam  Nursing note and vitals reviewed. Constitutional: He appears well-developed and well-nourished.  HENT:  Head: Normocephalic and atraumatic.  Eyes: Conjunctivae and EOM are normal. Pupils are equal, round, and reactive to light.  Neck: Normal range of motion.  Cardiovascular: Normal rate, regular rhythm and normal heart sounds.  Respiratory: Effort normal and breath sounds normal.  GI: Soft. Bowel sounds are normal.  Musculoskeletal: Normal range of motion.  Neurological: He is unresponsive.  Skin: Skin is warm and dry.  Psychiatric: He has a normal mood and affect.    Assessment/Plan: Altered mental status Headache Scoliosis Spinal stenosis Abnormal EKG Subacute CVA . Plan With conservative medical evaluation Do not recommend invasive procedure Doubt acute coronary syndrome Agree with neurology evaluation Agree with EEG Continue pain management for chronic pain syndrome Continue antibiotic therapy for cellulitis of the leg  Dorothia Passmore D Essence Merle 01/03/2017, 10:14 PM

## 2017-01-03 NOTE — Progress Notes (Signed)
OT Cancellation Note  Patient Details Name: Dustin LainJohn David Turner MRN: [id number] DOB: 02/23/1935   Cancelled Treatment:    Reason Eval/Treat Not Completed: Medical issues which prohibited therapy. Chart reviewed this morning. Pt noted to have seizure earlier. Cardiology consult pending. Troponins elevated. Spoke with RN. Pt minimally responsive/alert. Not appropriate for OT evaluation at this time. Will re-attempt at later date/time as pt is medically appropriate.  Richrd PrimeJamie Stiller, MPH, MS, OTR/L ascom [phone] 01/03/17, 8:33 AM

## 2017-01-03 NOTE — Progress Notes (Signed)
SLP Cancellation Note  Patient Details Name: Dustin Turner MRN: [id number] DOB: 10/15/1935   Cancelled treatment:       Reason Eval/Treat Not Completed: Fatigue/lethargy limiting ability to participate;Medical issues which prohibited therapy;Patient's level of consciousness;Patient not medically ready(chart reviewed; consulted and attempted w/ NSG) Pt did not arouse to max verbal and tactile stimulation by SLP. Pt is at high risk for aspiration if attempting any po's at this time. ST services will f/u when pt is appropriate for po trials.    Jerilynn SomKatherine Watson, MS, CCC-SLP Watson,Katherine 01/03/2017, 4:04 PM

## 2017-01-03 NOTE — Progress Notes (Signed)
Pt's rhythm changed to Vtack complex changed, MD notified, orders for EKG, Troponin's x's 3, Cardiology consult in place per MD's order.  Troponin results in system, MD notified. This am, noticed pt having a seizure, MD on call notified, new orders for ativan 2 mg Q 4 hrs prn. Pt resting without any distress. Continue to monitor

## 2017-01-04 ENCOUNTER — Inpatient Hospital Stay: Payer: Medicare Other

## 2017-01-04 ENCOUNTER — Encounter: Payer: Self-pay | Admitting: Radiology

## 2017-01-04 LAB — CBC
HEMATOCRIT: 36.2 % — AB (ref 40.0–52.0)
Hemoglobin: 12.1 g/dL — ABNORMAL LOW (ref 13.0–18.0)
MCH: 31.2 pg (ref 26.0–34.0)
MCHC: 33.5 g/dL (ref 32.0–36.0)
MCV: 93.2 fL (ref 80.0–100.0)
PLATELETS: 295 10*3/uL (ref 150–440)
RBC: 3.89 MIL/uL — ABNORMAL LOW (ref 4.40–5.90)
RDW: 13.7 % (ref 11.5–14.5)
WBC: 9 10*3/uL (ref 3.8–10.6)

## 2017-01-04 LAB — PROTIME-INR
INR: 1.16
Prothrombin Time: 14.7 seconds (ref 11.4–15.2)

## 2017-01-04 LAB — HEPARIN LEVEL (UNFRACTIONATED)
HEPARIN UNFRACTIONATED: 0.16 [IU]/mL — AB (ref 0.30–0.70)
Heparin Unfractionated: 0.28 IU/mL — ABNORMAL LOW (ref 0.30–0.70)

## 2017-01-04 LAB — APTT: APTT: 36 s (ref 24–36)

## 2017-01-04 LAB — GLUCOSE, CAPILLARY: Glucose-Capillary: 75 mg/dL (ref 65–99)

## 2017-01-04 MED ORDER — METRONIDAZOLE IN NACL 5-0.79 MG/ML-% IV SOLN: 500.0000 mg | Freq: Four times a day (QID) | INTRAVENOUS | Status: DC

## 2017-01-04 MED ORDER — DEXTROSE 5 % IV SOLN: 2.0000 g | Freq: Two times a day (BID) | INTRAVENOUS | Status: DC

## 2017-01-04 MED ORDER — HEPARIN (PORCINE) IN NACL 100-0.45 UNIT/ML-% IJ SOLN: 1500.0000 [IU]/h | INTRAMUSCULAR | Status: DC

## 2017-01-04 MED ORDER — LORAZEPAM 2 MG/ML IJ SOLN
1.0000 mg | Freq: Once | INTRAMUSCULAR | Status: AC
  Administered 2017-01-04: 1 mg via INTRAVENOUS

## 2017-01-04 MED ORDER — HEPARIN BOLUS VIA INFUSION
2200.0000 [IU] | Freq: Once | INTRAVENOUS | Status: AC
  Administered 2017-01-04: 11:00:00 2200 [IU] via INTRAVENOUS
  Filled 2017-01-04: qty 2200

## 2017-01-04 MED ORDER — IOPAMIDOL (ISOVUE-300) INJECTION 61%
75.0000 mL | Freq: Once | INTRAVENOUS | Status: AC | PRN
  Administered 2017-01-04: 75 mL via INTRAVENOUS

## 2017-01-04 NOTE — Care Management (Signed)
[facility]

## 2017-01-04 NOTE — Progress Notes (Signed)
ANTICOAGULATION CONSULT NOTE - Initial Consult  Pharmacy Consult for Heparin  Indication: DVT  Allergies  Allergen Reactions  . Amitriptyline   . Nortriptyline Other (See Comments)    GI  upset    Patient Measurements: Height: 5\' 9"  (175.3 cm) Weight: 157 lb 4.8 oz (71.4 kg) IBW/kg (Calculated) : 70.7 Heparin Dosing Weight: 74.8 kg   Vital Signs: Temp: 97.6 F (36.4 C) (12/21 0632) Temp Source: Oral (12/21 16100632) BP: 109/65 (12/21 96040632) Pulse Rate: 63 (12/21 0632)  Labs: Recent Labs    01/01/17 1239 01/02/17 0535 01/03/17 0000 01/03/17 0537 01/03/17 1117 01/04/17 0055 01/04/17 0912  HGB 13.3 12.2*  --   --   --  12.1*  --   HCT 40.1 35.1*  --   --   --  36.2*  --   PLT 262 280  --   --   --  295  --   APTT  --   --   --   --   --  36  --   LABPROT  --   --   --   --   --  14.7  --   INR  --   --   --   --   --  1.16  --   HEPARINUNFRC  --   --   --   --   --   --  0.16*  CREATININE 0.80 0.68 0.76 0.69  --   --   --   TROPONINI  --   --  0.41* 0.33* 0.26*  --   --     Estimated Creatinine Clearance: 74.9 mL/min (by C-G formula based on SCr of 0.69 mg/dL).   Medical History: Past Medical History:  Diagnosis Date  . Arthritis   . BPH (benign prostatic hyperplasia)   . DDD (degenerative disc disease), cervical   . Hypogonadism in male   . Insomnia   . Neurogenic bladder   . OP (osteoporosis)   . Scoliosis   . Spinal stenosis   . Urinary urgency   . Venous stasis     Medications:  Medications Prior to Admission  Medication Sig Dispense Refill Last Dose  . alendronate (FOSAMAX) 70 MG tablet    Past Week at Unknown time  . amoxicillin-clavulanate (AUGMENTIN) 875-125 MG tablet Take 1 tablet by mouth 2 (two) times daily.  0 12/31/2016 at Unknown time  . Ascorbic Acid (VITAMIN C) 1000 MG tablet Take 460 mg by mouth.   12/31/2016 at Unknown time  . baclofen (LIORESAL) 10 MG tablet Take 10 mg by mouth 3 (three) times daily.   12/31/2016 at Unknown time  .  Biotin (SUPER BIOTIN) 5 MG TABS 10,000 mg.   12/31/2016 at Unknown time  . Calcium Polycarbophil (FIBER-CAPS PO) Take 8 capsules by mouth daily.   12/31/2016 at Unknown time  . calcium-vitamin D (CALCIUM 500/D) 500-200 MG-UNIT per tablet Take by mouth.   12/31/2016 at Unknown time  . Chlorzoxazone (LORZONE) 750 MG TABS Take 1 tablet by mouth daily. Reported on 04/11/2015   12/31/2016 at Unknown time  . clonazePAM (KLONOPIN) 0.5 MG tablet Take 0.5 mg by mouth 2 (two) times daily.    12/31/2016 at Unknown time  . cycloSPORINE (RESTASIS) 0.05 % ophthalmic emulsion    12/31/2016 at Unknown time  . Desmopressin Acetate (NOCTIVA) 0.83 MCG/0.1ML EMUL Place 1 Squirt into the nose daily. 1 Bottle 3 12/31/2016 at Unknown time  . diclofenac sodium (VOLTAREN) 1 % GEL Apply topically.  12/31/2016 at Unknown time  . docusate sodium (COLACE) 100 MG capsule Take 100 mg by mouth daily.   12/31/2016 at Unknown time  . donepezil (ARICEPT) 10 MG tablet Take 10 mg by mouth at bedtime.    12/31/2016 at Unknown time  . DULoxetine (CYMBALTA) 60 MG capsule Take 30 mg by mouth daily.   12/31/2016 at Unknown time  . eszopiclone (LUNESTA) 2 MG TABS tablet take 1 tablet as needed for sleep  0 12/31/2016 at Unknown time  . fentaNYL (ACTIQ) 800 MCG lollipop Reported on 04/11/2015   01/01/2017 at Unknown time  . Fish Oil-Cholecalciferol (FISH OIL + D3) 1000-1000 MG-UNIT CAPS Take by mouth.   12/31/2016 at Unknown time  . fluticasone (FLONASE) 50 MCG/ACT nasal spray Place 2 sprays into both nostrils daily.   12/31/2016 at Unknown time  . gabapentin (NEURONTIN) 400 MG capsule Take 400 mg by mouth 4 (four) times daily.    12/31/2016 at Unknown time  . HYDROmorphone, PF, (DILAUDID HP) 250 MG injection Inject 10 mg as directed continuously.    01/01/2017 at Unknown time  . L-Methylfolate-Algae-B12-B6 (METANX) 3-90.314-2-35 MG CAPS Reported on 04/11/2015   12/31/2016 at Unknown time  . l-methylfolate-B6-B12 (METANX) 3-35-2 MG TABS tablet  Take 1 tablet by mouth daily.   12/31/2016 at Unknown time  . levETIRAcetam (KEPPRA) 250 MG tablet Take 250 mg by mouth 2 (two) times daily.   12/31/2016 at Unknown time  . LINZESS 290 MCG CAPS capsule    12/31/2016 at Unknown time  . loratadine (CLARITIN) 10 MG tablet Take 10 mg by mouth daily.   12/31/2016 at Unknown time  . Magnesium 250 MG TABS Take 1 tablet by mouth daily.   12/31/2016 at Unknown time  . memantine (NAMENDA TITRATION PACK) tablet pack Take by mouth daily.    12/31/2016 at Unknown time  . methylphenidate (RITALIN) 10 MG tablet Take 10 mg by mouth 3 (three) times daily with meals.    12/31/2016 at Unknown time  . mirabegron ER (MYRBETRIQ) 50 MG TB24 tablet Take 1 tablet (50 mg total) by mouth daily. 90 tablet 3 12/31/2016 at Unknown time  . MOVANTIK 12.5 MG TABS tablet Take 12.5 mg by mouth daily.    12/31/2016 at Unknown time  . Multiple Vitamin (MULTIVITAMIN) capsule Take 1 capsule by mouth daily.   12/31/2016 at Unknown time  . mupirocin ointment (BACTROBAN) 2 % Apply 1 application topically 3 (three) times daily.  1 12/31/2016 at Unknown time  . ofloxacin (FLOXIN) 0.3 % OTIC solution Place 5 drops into the left ear 2 (two) times daily.  0 12/31/2016 at Unknown time  . rOPINIRole (REQUIP) 0.25 MG tablet Take 0.25 mg by mouth at bedtime.   12/31/2016 at Unknown time  . Testosterone 20.25 MG/ACT (1.62%) GEL Place 1 Pump onto the skin daily.    12/31/2016 at Unknown time  . tizanidine (ZANAFLEX) 2 MG capsule Take 1 capsule by mouth 3 (three) times daily.   12/31/2016 at Unknown time  . Insulin Pen Needle (PEN NEEDLES 31GX5/16") 31G X 8 MM MISC 1 each by Does not apply route.   Not Taking  . NARCAN 4 MG/0.1ML LIQD Reported on 04/11/2015  0 prn at prn    Assessment: Pharmacy consulted to dose heparin in this 81 year old male with VTE. CrCl = 74.9 ml/min   Goal of Therapy:  Heparin level 0.3-0.7 units/ml Monitor platelets by anticoagulation protocol: Yes   Plan:  Give 4400   units bolus x 1  Start heparin infusion at 1300  units/hr Check anti-Xa level in 8  hours and daily while on heparin Continue to monitor H&H and platelets  12/21 0912 HL subtherapeutic at 0.16. Will order 2200unit bolus and increase infusion to 1500units/hr. Recheck heparin level in 6 hours.   Gardner Candle, PharmD, BCPS Clinical Pharmacist 01/04/2017 10:23 AM

## 2017-01-04 NOTE — Care Management Important Message (Addendum)
Important Message  Patient Details  Name: Dustin LainJohn David Turner MRN: [id number] Date of Birth: 10/01/1935   Medicare Important Message Given:  Yes  Signed IM notice given  [facility]. Did not appear patient's famly had received signed notice  Eber HongGreene, Dimetri Armitage R, RN 01/04/2017, 1:00 PM

## 2017-01-04 NOTE — Progress Notes (Signed)
OT Cancellation Note  Patient Details Name: Dustin Turner MRN: [id number] DOB: 09/24/1935   Cancelled Treatment:    Reason Eval/Treat Not Completed: Medical issues which prohibited therapy;Fatigue/lethargy limiting ability to participate(Pt. is lethargic, and unable to participate and engage in initial evaluation. Pt.  Troponin is elevated to .26. [facility]. WIll continue to monitor, and eval as appropriate.)  Olegario MessierElaine Neelah Mannings, MS, OTR/L 01/04/2017, 12:10 PM

## 2017-01-04 NOTE — Consult Note (Signed)
Referring Physician:  No referring provider defined for this encounter.  Primary Physician:  Orene DesanctisBehling, Karen, MD  Chief Complaint:  Confusion/mastoiditis/complex abscess  History of Present Illness: Dustin Turner is a 81 y.o. male who presents as a consult to our service for potential intracranial abscess. Patient is awake and alert but he is unable/unwilling to answer questions and communicate effectively. He is irritable and repeatedly asked for hand mitts to be removed while trying to get out of bed. History summary gathered providers notes. 01/01/2017 patient brought to ER for generalized weakness, confusion (2 days), poor oral intake (2 days), left leg infection.  CT revealed  - Left otomastoiditis complicated by dural venous thrombosis involving the left transverse and sigmoid sinuses as well as left internal jugular vein. - Approximate 2cm rim enhancing intracranial hypodensity along left petrous ridge at the level of the temporal bone. -acute to subacute infarct Patient has spinal cord stimulator placed and unable to complete MRI.  Treated for ear infection the past several weeks. - amoxicillin and augmentin.    Review of Systems:  A 10 point review of systems is negative, except for the pertinent positives and negatives detailed in the HPI.  Past Medical History: Past Medical History:  Diagnosis Date  . Arthritis   . BPH (benign prostatic hyperplasia)   . DDD (degenerative disc disease), cervical   . Hypogonadism in male   . Insomnia   . Neurogenic bladder   . OP (osteoporosis)   . Scoliosis   . Spinal stenosis   . Urinary urgency   . Venous stasis     Past Surgical History: Past Surgical History:  Procedure Laterality Date  . CERVICAL SPINE SURGERY    . HERNIA REPAIR    . JOINT REPLACEMENT    . LUMBAR SPINE SURGERY    . Proleive System Treatment  2006  . ROTATOR CUFF REPAIR    . THORACIC SPINE SURGERY      Allergies: Allergies as of 01/01/2017 - Review  Complete 01/01/2017  Allergen Reaction Noted  . Amitriptyline  07/23/2014  . Nortriptyline Other (See Comments) 07/23/2014    Medications:  Current Facility-Administered Medications:  .  0.9 %  sodium chloride infusion, , Intravenous, Continuous, Sainani, Rolly PancakeVivek J, MD, Last Rate: 50 mL/hr at 01/03/17 1820 .  acetaminophen (TYLENOL) tablet 650 mg, 650 mg, Oral, Q4H PRN, 650 mg at 01/04/17 0036 **OR** acetaminophen (TYLENOL) solution 650 mg, 650 mg, Per Tube, Q4H PRN **OR** acetaminophen (TYLENOL) suppository 650 mg, 650 mg, Rectal, Q4H PRN, Katha HammingKonidena, Snehalatha, MD, 650 mg at 01/02/17 2357 .  aspirin EC tablet 81 mg, 81 mg, Oral, Daily, Salary, Montell D, MD .  atorvastatin (LIPITOR) tablet 20 mg, 20 mg, Oral, q1800, Salary, Montell D, MD .  baclofen (LIORESAL) tablet 10 mg, 10 mg, Oral, TID, Katha HammingKonidena, Snehalatha, MD, 10 mg at 01/04/17 0033 .  bisacodyl (DULCOLAX) EC tablet 5 mg, 5 mg, Oral, Daily PRN, Katha HammingKonidena, Snehalatha, MD .  cefTRIAXone (ROCEPHIN) 2 g in dextrose 5 % 50 mL IVPB, 2 g, Intravenous, Q12H, Salary, Montell D, MD, Stopped at 01/04/17 0145 .  chlorzoxazone (PARAFON) tablet 750 mg, 750 mg, Oral, Daily, Katha HammingKonidena, Snehalatha, MD, 750 mg at 01/02/17 1055 .  clonazePAM (KLONOPIN) tablet 0.5 mg, 0.5 mg, Oral, BID, Katha HammingKonidena, Snehalatha, MD, 0.5 mg at 01/04/17 0034 .  cycloSPORINE (RESTASIS) 0.05 % ophthalmic emulsion 1 drop, 1 drop, Both Eyes, BID, Katha HammingKonidena, Snehalatha, MD, 1 drop at 01/04/17 0035 .  Desmopressin Acetate EMUL 1 spray, 1 spray,  Nasal, QPM, Hallaji, Mardene Speak, RPH .  diclofenac sodium (VOLTAREN) 1 % transdermal gel 2 g, 2 g, Topical, QID PRN, Katha Hamming, MD .  docusate (COLACE) 50 MG/5ML liquid 100 mg, 100 mg, Oral, BID, Hallaji, Sheema M, RPH, 100 mg at 01/04/17 0037 .  donepezil (ARICEPT) tablet 10 mg, 10 mg, Oral, QHS, Katha Hamming, MD, 10 mg at 01/04/17 0035 .  DULoxetine (CYMBALTA) DR capsule 30 mg, 30 mg, Oral, Daily, Katha Hamming, MD, 30 mg  at 01/02/17 1056 .  fluticasone (FLONASE) 50 MCG/ACT nasal spray 2 spray, 2 spray, Each Nare, Daily, Katha Hamming, MD .  gabapentin (NEURONTIN) capsule 400 mg, 400 mg, Oral, QID, Katha Hamming, MD, 400 mg at 01/04/17 0036 .  haloperidol lactate (HALDOL) injection 2.5 mg, 2.5 mg, Intravenous, Q6H PRN, Houston Siren, MD, 2.5 mg at 01/04/17 0200 .  heparin ADULT infusion 100 units/mL (25000 units/242mL sodium chloride 0.45%), 1,300 Units/hr, Intravenous, Continuous, Sainani, Rolly Pancake, MD, Last Rate: 13 mL/hr at 01/04/17 0148, 1,300 Units/hr at 01/04/17 0148 .  levETIRAcetam (KEPPRA) 500 mg in sodium chloride 0.9 % 100 mL IVPB, 500 mg, Intravenous, Q12H, Thana Farr, MD, Stopped at 01/04/17 [phone]  linaclotide (LINZESS) capsule 145 mcg, 145 mcg, Oral, QAC breakfast, Katha Hamming, MD, 145 mcg at 01/02/17 1056 .  LORazepam (ATIVAN) injection 2 mg, 2 mg, Intravenous, Q4H PRN, Pyreddy, Pavan, MD, 2 mg at 01/03/17 1554 .  magnesium oxide (MAG-OX) tablet 400 mg, 400 mg, Oral, Daily, Katha Hamming, MD, 400 mg at 01/02/17 1054 .  memantine (NAMENDA XR) 24 hr capsule 21 mg, 21 mg, Oral, Daily, Katha Hamming, MD, 21 mg at 01/02/17 1054 .  metoprolol tartrate (LOPRESSOR) tablet 12.5 mg, 12.5 mg, Oral, BID, Salary, Montell D, MD, 12.5 mg at 01/04/17 0036 .  metroNIDAZOLE (FLAGYL) IVPB 500 mg, 500 mg, Intravenous, Q6H, Salary, Montell D, MD, Last Rate: 100 mL/hr at 01/04/17 0746, 500 mg at 01/04/17 0746 .  mirabegron ER (MYRBETRIQ) tablet 50 mg, 50 mg, Oral, Daily, Katha Hamming, MD, 50 mg at 01/02/17 1054 .  multivitamin with minerals tablet 1 tablet, 1 tablet, Oral, Daily, Katha Hamming, MD, 1 tablet at 01/02/17 1055 .  nitroGLYCERIN (NITROSTAT) SL tablet 0.4 mg, 0.4 mg, Sublingual, Q5 min PRN, Salary, Montell D, MD .  ofloxacin (OCUFLOX) 0.3 % ophthalmic solution 5 drop, 5 drop, Left EAR, Daily, Katha Hamming, MD, 5 drop at 01/02/17 1106 .   ondansetron (ZOFRAN) tablet 4 mg, 4 mg, Oral, Q6H PRN **OR** ondansetron (ZOFRAN) injection 4 mg, 4 mg, Intravenous, Q6H PRN, Katha Hamming, MD .  polycarbophil (FIBERCON) tablet 625 mg, 625 mg, Oral, Daily, Sainani, Rolly Pancake, MD, 625 mg at 01/02/17 1539 .  psyllium (HYDROCIL/METAMUCIL) packet 1 packet, 1 packet, Oral, Daily, Sainani, Vivek J, MD .  rOPINIRole (REQUIP) tablet 0.25 mg, 0.25 mg, Oral, QHS, Katha Hamming, MD, 0.25 mg at 01/04/17 0038 .  senna-docusate (Senokot-S) tablet 1 tablet, 1 tablet, Oral, BID, Katha Hamming, MD, 1 tablet at 01/02/17 2259 .  silver sulfADIAZINE (SILVADENE) 1 % cream, , Topical, Daily, Sainani, Vivek J, MD .  tiZANidine (ZANAFLEX) tablet 2 mg, 2 mg, Oral, TID, Katha Hamming, MD, 2 mg at 01/04/17 0035 .  traZODone (DESYREL) tablet 25 mg, 25 mg, Oral, QHS PRN, Katha Hamming, MD   Social History: Social History   Tobacco Use  . Smoking status: Never Smoker  . Smokeless tobacco: Never Used  Substance Use Topics  . Alcohol use: No    Alcohol/week: 0.0 oz  .  Drug use: No    Family Medical History: Family History  Problem Relation Age of Onset  . Diabetes Father   . Stroke Father   . Alcohol abuse Father   . Diabetes Brother   . Stroke Mother   . Alcohol abuse Brother   . Kidney disease Neg Hx   . Prostate cancer Neg Hx     Physical Examination: Vitals:   01/03/17 1928 01/04/17 0632  BP: 135/69 109/65  Pulse: 70 63  Resp:    Temp: 99.1 F (37.3 C) 97.6 F (36.4 C)  SpO2: 99% (!) 83%   Head:  Pupils equal, round, and reactive to light.  Neck:   Soft collar in place.   Respiratory: Patient is breathing without any difficulty.  Extremities: Hand mitts place bilaterally.   Skin:   Dressing on left lower extremity. Skin discoloration present lower extremities.   NEUROLOGICAL:  General: Irritable. Asking repetitive questions and requesting hand mitts to be removed.  Awake, alert. Unable to assess  orientation to person, place, and time.    Pupils equal round and reactive to light.  Facial tone is symmetric.   ROM of spine: full - patient able to sit up, lay down, and rotate.    Strength: Patient moving upper and lower extremities without issue. Unable to get out of bed and easily fatigued. Extremity strength appears intact. Unable to perform formal exam.   Imaging:  Ct Head Wo Contrast  01/01/2017 CLINICAL DATA:  Altered level of consciousness. EXAM: CT HEAD WITHOUT CONTRAST TECHNIQUE: Contiguous axial images were obtained from the base of the skull through the vertex without intravenous contrast. COMPARISON:  10/24/2016 FINDINGS: Brain: Low-density in the left cerebellar hemisphere is new since prior study compatible with acute to subacute infarct. There is atrophy and chronic small vessel disease changes. No hemorrhage. Mild ventricular prominence likely related to ex vacuo dilatation. Vascular: No hyperdense vessel or unexpected calcification. Skull: No acute calvarial abnormality. Sinuses/Orbits: Complete opacification of the left mastoid air cells. Visualized paranasal sinuses clear. Other: None IMPRESSION: New low-density area in the left cerebellar hemisphere compatible with acute to subacute infarction. Atrophy, chronic small vessel disease. Left mastoid effusion, new, question mastoiditis. Electronically Signed   By: Charlett Nose M.D.   On: 01/01/2017 13:53   EXAM: CT HEAD WITHOUT AND WITH CONTRAST 10/24/2016 FINDINGS: Brain: White matter volume loss along the atria of the lateral ventricles is likely congenital. No acute infarct, hemorrhage, or mass lesion is present. Basal ganglia are intact. No focal cortical lesions are present. The brainstem and cerebellum are normal. No discrete hippocampal lesions are present. The temporal lobes are within normal limits bilaterally. No significant white matter disease is present.  Vascular: Atherosclerotic changes are present within the  cavernous internal carotid arteries bilaterally. There is no hyperdense vessel.  Skull: The calvarium is intact. No focal lytic or blastic lesion is present.  Sinuses/Orbits: The paranasal sinuses and mastoid air cells are clear bilaterally. The globes and orbits are within normal limits.  IMPRESSION: 1. Bilateral periatrial parietal white matter volume loss is likely chronic or congenital. 2. No acute or focal lesion to explain seizures.  EXAM: CT ANGIOGRAPHY HEAD AND NECK 01/03/2017 COMPARISON:  Prior head CT from 01/01/2017.  FINDINGS: CT HEAD FINDINGS  Brain: Cerebral volume within normal limits for age. Evolving hypodensity within the left cerebellar hemisphere again seen. Minimal internal hyperdensity suggestive of possible petechial hemorrhage without frank hemorrhagic transformation. No significant mass effect. No other evidence for acute intracranial hemorrhage.  No other large vessel territory infarct. No mass lesion or midline shift. Diffuse ventricular prominence with colpocephaly, stable. No frank hydrocephalus. No extra-axial fluid collection.  Vascular: No hyperdense vessel. Scattered vascular calcifications noted within the carotid siphons.  Skull: Scalp soft tissues within normal limits.  Calvarium intact.  Sinuses: Paranasal sinuses are clear. Left greater than right mastoid effusions.  Orbits: Globes and orbital soft tissues within normal limits.  Review of the MIP images confirms the above findings  CTA NECK FINDINGS 01/03/2017 Aortic arch: Visualized aortic arch of normal caliber with normal branch pattern. Mild plaque within the arch itself. No flow-limiting stenosis about the origin of the great vessels. Partially visualized subclavian artery is widely patent.  Right carotid system: Right common and internal carotid artery is widely patent without stenosis, dissection, or occlusion. No atheromatous narrowing about the right carotid  bifurcation.  Left carotid system: Left common and internal carotid arteries are widely patent without stenosis, dissection, or occlusion. Minimal atheromatous plaque about the left bifurcation without stenosis.  Vertebral arteries: Both of the vertebral arteries arise from the subclavian arteries. Left vertebral artery dominant. Vertebral arteries widely patent within the neck without stenosis, dissection, or occlusion.  Skeleton: No acute osseus abnormality. No worrisome lytic or blastic osseous lesions. Reversal of the normal cervical lordosis with sequelae of prior fusion at C4 through C7. Advanced degenerative spondylolysis present at C7-T1 and T1-T2.  Other neck: No other acute soft tissue abnormality within the neck. No adenopathy. Salivary glands normal. Thyroid normal.  Upper chest: Visualized upper mediastinum within normal limits. Layering bilateral pleural effusions with associated atelectasis, slightly larger on the left. Partially visualized lungs are otherwise grossly clear.  Review of the MIP images confirms the above findings  CTA HEAD FINDINGS 01/03/2017 Anterior circulation: Petrous segments widely patent bilaterally. Mild atheromatous plaque within the cavernous ICAs without flow-limiting stenosis. ICA termini widely patent. A1 segments patent. Normal anterior communicating artery. Anterior cerebral arteries patent to their distal aspects without stenosis. M1 segments patent without stenosis or occlusion. No proximal M2 occlusion. Distal MCA branches well perfused and symmetric.  Posterior circulation: Dominant left vertebral artery widely patent to the vertebrobasilar junction. Hypoplastic right vertebral artery patent as well without stenosis. Right PICA patent. Left PICA not well visualized. Basilar artery mildly tortuous but widely patent to its distal aspect. Superior cerebral arteries patent bilaterally. Left PCA supplied via the basilar.  Predominant fetal type right PCA supplied via a widely patent right posterior communicating artery. PCAs widely patent to their distal aspects without stenosis.  Venous sinuses: Abnormal filling defect present within knee left transverse and sigmoid sinuses, extending into the proximal left internal jugular vein, suspicious for venous sinus thrombosis. Name dural sinuses are otherwise patent. This is likely unchanged relative to recent CT from 01/01/2017, but appears new relative to prior CT from 10/24/2016.  Anatomic variants: Fetal type right PCA. No aneurysm or vascular abnormality.  Delayed phase: Hypodensity within the left cerebellar hemisphere demonstrates no significant enhancement. There is question of adjacent 2 cm rim enhancing hypodensity about the petrous aspect of the left temporal bone (series 16, image 5). Dural sinus thrombosis involving the left transverse and sigmoid sinuses again noted. No other abnormal enhancement.  Review of the MIP images confirms the above findings  IMPRESSION: 1. Overall findings suggestive of acute left otomastoiditis complicated by dural venous thrombosis involving the left transverse and sigmoid sinuses as well as the left internal jugular vein. Adjacent hypodensity within the left cerebellar hemisphere may reflect associated  venous infarct or possibly focal encephalitis. 2. Additional approximate 2 cm rim enhancing intracranial hypodensity positioned along the left petrous ridge at the level of the left temporal bone, also suspicious for associated encephalitis/intracranial infection. Possible abscess may be present. 3. Otherwise negative CTA of the head and neck. 4. Bilateral pleural effusions with associated atelectasis.  Assessment and Plan: Dustin Turner is a 81 y.o. male with mastoiditis and possible intracranial abscess. Dr Myer HaffYarbrough has reviewed imaging. Unable to perform formal physical exam or gather formal history due to  patient condition or either confusion, hearing impairment, or both. Per Dr. Myer HaffYarbrough ordered Head CT with/without contrast to determine if possible abscess has changed in size since last imaging. Additionally requesting temporal bone CT with contrast. Recommend ENT consult. Dr[facility]  MRI would be preferred, but he is unable to receive MRI due to spinal cord stimulator placement.  Imaging and consult orders completed.   Ivar DrapeAmanda Hanley Rispoli, PA-C Dept. of Neurosurgery

## 2017-01-04 NOTE — Discharge Summary (Signed)
[facility] NAME: Dustin Turner    MR#:  [id number]  DATE OF BIRTH:  09/27/1935  DATE OF ADMISSION:  01/01/2017 ADMITTING PHYSICIAN: Epifanio Lesches, MD  DATE OF DISCHARGE: 01/04/2017  PRIMARY CARE PHYSICIAN: Barbaraann Boys, MD    ADMISSION DIAGNOSIS:  Encephalopathy acute [G93.40] Left leg cellulitis [O67.672] Cerebellar stroke ([place]) [I63.9]  DISCHARGE DIAGNOSIS:  Active Problems:   Encephalopathy acute   Cerebellar stroke ([place])   SECONDARY DIAGNOSIS:   Past Medical History:  Diagnosis Date  . Arthritis   . BPH (benign prostatic hyperplasia)   . DDD (degenerative disc disease), cervical   . Hypogonadism in male   . Insomnia   . Neurogenic bladder   . OP (osteoporosis)   . Scoliosis   . Spinal stenosis   . Urinary urgency   . Venous stasis     HOSPITAL COURSE:   81 year old male with past medical history of chronic pain, spinal stenosis, scoliosis, neurogenic bladder, degenerative disc disease, BPH, osteoarthritis who presented to the hospital due to altered mental status.  1. Altered mental status- On Admission Patient had a CT head which was noted to have an acute/subacute cerebellar CVA but it's unclear that patient's mental status changes was related to that. We could not do an MRI this patient has a spinal stimulator. Neurology consult was obtained and patient underwent an EEG which was suspected for underlying seizures. Patient was loaded with IV Keppra and his Keppra dose was increased. Despite that patient's mental status has not improved. -Patient subsequently underwent a CT angiogram of the head and neck. The CT angiogram of the head and neck was suggestive of chronic mastoiditis with intracranial abscess and also feels sinus thrombosis. -A neurosurgical and ENT evaluation was obtained. After ENT evaluation he recommended that patient be transferred to a tertiary care center as he may need neurosurgical  intervention which is not available here. I spoke with Dr. Cari Caraway from neurosurgery who will help [place] facilitate patient is transferred to [facility] for possible Otologic/neurosurgical intervention. -Patient empirically has been started on a heparin drip for the dural venous thrombosis and also empiric IV antibiotics with ceftriaxone, Zithromax to cover for the mastoiditis and intracranial abscess.  2. 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 Patient has been started on a heparin drip, IV ceftriaxone and Flagyl. -An ENT and neurosurgical consult were obtained and they recommend transfer to tertiary care center for possible neurosurgical intervention and admission to the neuro ICU. Services are not available here and therefore Dr. Cari Caraway from neurosurgery helping facilitate patient's transfer.  3. Acute/subacute CVA-patient on CT head on admission noted to have a cerebellar CVA.  - Appreciate neurology input. Continue aspirin, cannot do MRI given the spinal stimulator. -Carotid duplex negative for acute pathology. Echo showing normal ejection fraction with no thrombus. -Continue aspirin  4. Left lower extreme cellulitis-continue IV Ceftriaxone and clinically improving.   5. History of spinal stenosis/chronic pain-patient has a spinal stimulator with a Dilaudid infusion. Continue baclofen, Neurontin, Cymbalta - hold PRN Vicodin, Dilaudid until mental status improves.   6. Restless leg syndrome- pt. Will continue Requip.  7. Dementia- pt. Will continue Aricept, Namenda.  8. Anxiety- pt. Will continue Klonopin.  Patient is being transferred to a tertiary care center for further evaluation. The patient's family is in agreement with this plan.  Discussed with family, ENT and also neurosurgery.  DISCHARGE  CONDITIONS:   Stable  CONSULTS OBTAINED:  Treatment Team:  Alexis Goodell,  MD Leotis Pain, MD Yolonda Kida, MD Leonel Ramsay, MD Carloyn Manner, MD  DRUG ALLERGIES:   Allergies  Allergen Reactions  . Amitriptyline   . Nortriptyline Other (See Comments)    GI  upset    DISCHARGE MEDICATIONS:   Allergies as of 01/04/2017      Reactions   Amitriptyline    Nortriptyline Other (See Comments)   GI  upset      Medication List    STOP taking these medications   amoxicillin-clavulanate 875-125 MG tablet Commonly known as:  AUGMENTIN     TAKE these medications   alendronate 70 MG tablet Commonly known as:  FOSAMAX   ARICEPT 10 MG tablet Generic drug:  donepezil Take 10 mg by mouth at bedtime.   baclofen 10 MG tablet Commonly known as:  LIORESAL Take 10 mg by mouth 3 (three) times daily.   CALCIUM 500/D 500-200 MG-UNIT tablet Generic drug:  calcium-vitamin D Take by mouth.   cefTRIAXone 2 g in dextrose 5 % 50 mL Inject 2 g into the vein every 12 (twelve) hours.   clonazePAM 0.5 MG tablet Commonly known as:  KLONOPIN Take 0.5 mg by mouth 2 (two) times daily.   Desmopressin Acetate 0.83 MCG/0.1ML Emul Commonly known as:  NOCTIVA Place 1 Squirt into the nose daily.   docusate sodium 100 MG capsule Commonly known as:  COLACE Take 100 mg by mouth daily.   DULoxetine 60 MG capsule Commonly known as:  CYMBALTA Take 30 mg by mouth daily.   eszopiclone 2 MG Tabs tablet Commonly known as:  LUNESTA take 1 tablet as needed for sleep   fentaNYL 800 MCG lollipop Commonly known as:  ACTIQ Reported on 04/11/2015   FIBER-CAPS PO Take 8 capsules by mouth daily.   FISH OIL + D3 1000-1000 MG-UNIT Caps Take by mouth.   fluticasone 50 MCG/ACT nasal spray Commonly known as:  FLONASE Place 2 sprays into both nostrils daily.   gabapentin 400 MG capsule Commonly known as:  NEURONTIN Take 400 mg by mouth 4 (four) times daily.   heparin 100-0.45 UNIT/ML-% infusion Inject 1,500 Units/hr into the vein continuous.    HYDROmorphone (PF) 250 MG injection Commonly known as:  DILAUDID HP Inject 10 mg as directed continuously.   l-methylfolate-B6-B12 3-35-2 MG Tabs tablet Commonly known as:  METANX Take 1 tablet by mouth daily.   levETIRAcetam 250 MG tablet Commonly known as:  KEPPRA Take 250 mg by mouth 2 (two) times daily.   LINZESS 290 MCG Caps capsule Generic drug:  linaclotide   loratadine 10 MG tablet Commonly known as:  CLARITIN Take 10 mg by mouth daily.   LORZONE 750 MG Tabs Generic drug:  Chlorzoxazone Take 1 tablet by mouth daily. Reported on 04/11/2015   Magnesium 250 MG Tabs Take 1 tablet by mouth daily.   memantine tablet pack Commonly known as:  NAMENDA TITRATION PACK Take by mouth daily.   METANX 3-90.314-2-35 MG Caps Reported on 04/11/2015   methylphenidate 10 MG tablet Commonly known as:  RITALIN Take 10 mg by mouth 3 (three) times daily with meals.   metroNIDAZOLE 5-0.79 MG/ML-% IVPB Commonly known as:  FLAGYL Inject 100 mLs (500 mg total) into the vein every 6 (six) hours.   mirabegron ER 50 MG Tb24 tablet Commonly known as:  MYRBETRIQ Take 1 tablet (50 mg total) by mouth daily.   MOVANTIK 12.5 MG Tabs tablet  Generic drug:  naloxegol oxalate Take 12.5 mg by mouth daily.   multivitamin capsule Take 1 capsule by mouth daily.   mupirocin ointment 2 % Commonly known as:  BACTROBAN Apply 1 application topically 3 (three) times daily.   NARCAN 4 MG/0.1ML Liqd nasal spray kit Generic drug:  naloxone Reported on 04/11/2015   ofloxacin 0.3 % OTIC solution Commonly known as:  FLOXIN Place 5 drops into the left ear 2 (two) times daily.   PEN NEEDLES 31GX5/16" 31G X 8 MM Misc 1 each by Does not apply route.   RESTASIS 0.05 % ophthalmic emulsion Generic drug:  cycloSPORINE   rOPINIRole 0.25 MG tablet Commonly known as:  REQUIP Take 0.25 mg by mouth at bedtime.   SUPER BIOTIN 5 MG Tabs Generic drug:  Biotin 10,000 mg.   Testosterone 20.25 MG/ACT  (1.62%) Gel Place 1 Pump onto the skin daily.   tizanidine 2 MG capsule Commonly known as:  ZANAFLEX Take 1 capsule by mouth 3 (three) times daily.   vitamin C 1000 MG tablet Take 460 mg by mouth.   VOLTAREN 1 % Gel Generic drug:  diclofenac sodium Apply topically.         DISCHARGE INSTRUCTIONS:   DIET:  NPO due to mental status  DISCHARGE CONDITION:  Serious  ACTIVITY:  Activity as tolerated  OXYGEN:  Home Oxygen: No.   Oxygen Delivery: room air  DISCHARGE LOCATION:  [facility]   If you experience worsening of your admission symptoms, develop shortness of breath, life threatening emergency, suicidal or homicidal thoughts you must seek medical attention immediately by calling 911 or calling your MD immediately  if symptoms less severe.  You Must read complete instructions/literature along with all the possible adverse reactions/side effects for all the Medicines you take and that have been prescribed to you. Take any new Medicines after you have completely understood and accpet all the possible adverse reactions/side effects.   Please note  You were cared for by a hospitalist during your hospital stay. If you have any questions about your discharge medications or the care you received while you were in the hospital after you are discharged, you can call the unit and asked to speak with the hospitalist on call if the hospitalist that took care of you is not available. Once you are discharged, your primary care physician will handle any further medical issues. Please note that NO REFILLS for any discharge medications will be authorized once you are discharged, as it is imperative that you return to your primary care physician (or establish a relationship with a primary care physician if you do not have one) for your aftercare needs so that they can reassess your need for medications and monitor your lab values.     Today   Remains  Lethargic/Encephalopathic but follows simple commands. Agitated at times.  Receiving PRN haldol for Agitation. CTA of the head and neck concerning for chronic mastoiditis with intracranial abscess and dural venous thrombosis. Patient being transferred to a tertiary care center for further evaluation.  VITAL SIGNS:  Blood pressure 109/65, pulse 63, temperature 97.6 F (36.4 C), temperature source Oral, resp. rate 20, height 5' 9"  (1.753 m), weight 71.4 kg (157 lb 4.8 oz), SpO2 (!) 83 %.  I/O:    Intake/Output Summary (Last 24 hours) at 01/04/2017 1222 Last data filed at 01/04/2017 1111 Gross per 24 hour  Intake 1589.98 ml  Output 1400 ml  Net 189.98 ml    PHYSICAL EXAMINATION:  GENERAL:  81 y.o.-year-old patient lying in bed lethargic/encephalopathic  EYES: Pupils equal, round, pinpoint and minimally reactive to light. No scleral icterus. Extraocular muscles intact.  HEENT: Head atraumatic, normocephalic. Oropharynx and nasopharynx clear.  NECK:  Supple, no jugular venous distention. No thyroid enlargement, no tenderness.  LUNGS: Normal breath sounds bilaterally, no wheezing, rales, rhonchi. No use of accessory muscles of respiration.  CARDIOVASCULAR: S1, S2 normal. No murmurs, rubs, or gallops.  ABDOMEN: Soft, nontender, nondistended. Bowel sounds present. No organomegaly or mass.  EXTREMITIES: No cyanosis, clubbing or edema b/l.   Left lower extremity redness, swelling consistent with mild cellulitis. NEUROLOGIC: Encephalopathic and difficult to assess.  PSYCHIATRIC: Encephalopathic.  SKIN: No obvious rash, lesion, or ulcer.      DATA REVIEW:   CBC Recent Labs  Lab 01/04/17 0055  WBC 9.0  HGB 12.1*  HCT 36.2*  PLT 295    Chemistries  Recent Labs  Lab 01/01/17 1239  01/03/17 0537  NA 130*   < > 134*  K 4.2   < > 3.8  CL 87*   < > 92*  CO2 31   < > 32  GLUCOSE 165*   < > 138*  BUN 21*   < > 18  CREATININE 0.80   < > 0.69  CALCIUM 9.2   < > 8.4*  MG  --    <  > 1.9  AST 38  --   --   ALT 35  --   --   ALKPHOS 110  --   --   BILITOT 0.5  --   --    < > = values in this interval not displayed.    Cardiac Enzymes Recent Labs  Lab 01/03/17 1117  TROPONINI 0.26*    Microbiology Results  Results for orders placed or performed during the hospital encounter of 01/01/17  Urine culture     Status: None   Collection Time: 01/01/17 12:59 PM  Result Value Ref Range Status   Specimen Description URINE, RANDOM  Final   Special Requests NONE  Final   Culture   Final    NO GROWTH Performed at [facility], [address] [place], [place] [address]    Report Status 01/02/2017 FINAL  Final    RADIOLOGY:  Ct Angio Head W Or Wo Contrast  Result Date: 01/03/2017 CLINICAL DATA:  Initial evaluation for acute encephalopathy. Stroke. EXAM: CT ANGIOGRAPHY HEAD AND NECK TECHNIQUE: Multidetector CT imaging of the head and neck was performed using the standard protocol during bolus administration of intravenous contrast. Multiplanar CT image reconstructions and MIPs were obtained to evaluate the vascular anatomy. Carotid stenosis measurements (when applicable) are obtained utilizing NASCET criteria, using the distal internal carotid diameter as the denominator. CONTRAST:  29m ISOVUE-370 IOPAMIDOL (ISOVUE-370) INJECTION 76% COMPARISON:  Prior head CT from 01/01/2017. FINDINGS: CT HEAD FINDINGS Brain: Cerebral volume within normal limits for age. Evolving hypodensity within the left cerebellar hemisphere again seen. Minimal internal hyperdensity suggestive of possible petechial hemorrhage without frank hemorrhagic transformation. No significant mass effect. No other evidence for acute intracranial hemorrhage. No other large vessel territory infarct. No mass lesion or midline shift. Diffuse ventricular prominence with colpocephaly, stable. No frank hydrocephalus. No extra-axial fluid collection. Vascular: No hyperdense vessel. Scattered vascular calcifications  noted within the carotid siphons. Skull: Scalp soft tissues within normal limits.  Calvarium intact. Sinuses: Paranasal sinuses are clear. Left greater than right mastoid effusions. Orbits: Globes and orbital soft tissues within normal limits.  Review of the MIP images confirms the above findings CTA NECK FINDINGS Aortic arch: Visualized aortic arch of normal caliber with normal branch pattern. Mild plaque within the arch itself. No flow-limiting stenosis about the origin of the great vessels. Partially visualized subclavian artery is widely patent. Right carotid system: Right common and internal carotid artery is widely patent without stenosis, dissection, or occlusion. No atheromatous narrowing about the right carotid bifurcation. Left carotid system: Left common and internal carotid arteries are widely patent without stenosis, dissection, or occlusion. Minimal atheromatous plaque about the left bifurcation without stenosis. Vertebral arteries: Both of the vertebral arteries arise from the subclavian arteries. Left vertebral artery dominant. Vertebral arteries widely patent within the neck without stenosis, dissection, or occlusion. Skeleton: No acute osseus abnormality. No worrisome lytic or blastic osseous lesions. Reversal of the normal cervical lordosis with sequelae of prior fusion at C4 through C7. Advanced degenerative spondylolysis present at C7-T1 and T1-T2. Other neck: No other acute soft tissue abnormality within the neck. No adenopathy. Salivary glands normal. Thyroid normal. Upper chest: Visualized upper mediastinum within normal limits. Layering bilateral pleural effusions with associated atelectasis, slightly larger on the left. Partially visualized lungs are otherwise grossly clear. Review of the MIP images confirms the above findings CTA HEAD FINDINGS Anterior circulation: Petrous segments widely patent bilaterally. Mild atheromatous plaque within the cavernous ICAs without flow-limiting stenosis.  ICA termini widely patent. A1 segments patent. Normal anterior communicating artery. Anterior cerebral arteries patent to their distal aspects without stenosis. M1 segments patent without stenosis or occlusion. No proximal M2 occlusion. Distal MCA branches well perfused and symmetric. Posterior circulation: Dominant left vertebral artery widely patent to the vertebrobasilar junction. Hypoplastic right vertebral artery patent as well without stenosis. Right PICA patent. Left PICA not well visualized. Basilar artery mildly tortuous but widely patent to its distal aspect. Superior cerebral arteries patent bilaterally. Left PCA supplied via the basilar. Predominant fetal type right PCA supplied via a widely patent right posterior communicating artery. PCAs widely patent to their distal aspects without stenosis. Venous sinuses: Abnormal filling defect present within knee left transverse and sigmoid sinuses, extending into the proximal left internal jugular vein, suspicious for venous sinus thrombosis. Name dural sinuses are otherwise patent. This is likely unchanged relative to recent CT from 01/01/2017, but appears new relative to prior CT from 10/24/2016. Anatomic variants: Fetal type right PCA. No aneurysm or vascular abnormality. Delayed phase: Hypodensity within the left cerebellar hemisphere demonstrates no significant enhancement. There is question of adjacent 2 cm rim enhancing hypodensity about the petrous aspect of the left temporal bone (series 16, image 5). Dural sinus thrombosis involving the left transverse and sigmoid sinuses again noted. No other abnormal enhancement. Review of the MIP images confirms the above findings IMPRESSION: 1. Overall findings suggestive of acute left otomastoiditis complicated by dural venous thrombosis involving the left transverse and sigmoid sinuses as well as the left internal jugular vein. Adjacent hypodensity within the left cerebellar hemisphere may reflect associated  venous infarct or possibly focal encephalitis. 2. Additional approximate 2 cm rim enhancing intracranial hypodensity positioned along the left petrous ridge at the level of the left temporal bone, also suspicious for associated encephalitis/intracranial infection. Possible abscess may be present. 3. Otherwise negative CTA of the head and neck. 4. Bilateral pleural effusions with associated atelectasis. Electronically Signed   By: Jeannine Boga M.D.   On: 01/03/2017 19:59   Ct Angio Neck W Or Wo Contrast  Result Date: 01/03/2017 CLINICAL DATA:  Initial evaluation for  acute encephalopathy. Stroke. EXAM: CT ANGIOGRAPHY HEAD AND NECK TECHNIQUE: Multidetector CT imaging of the head and neck was performed using the standard protocol during bolus administration of intravenous contrast. Multiplanar CT image reconstructions and MIPs were obtained to evaluate the vascular anatomy. Carotid stenosis measurements (when applicable) are obtained utilizing NASCET criteria, using the distal internal carotid diameter as the denominator. CONTRAST:  62m ISOVUE-370 IOPAMIDOL (ISOVUE-370) INJECTION 76% COMPARISON:  Prior head CT from 01/01/2017. FINDINGS: CT HEAD FINDINGS Brain: Cerebral volume within normal limits for age. Evolving hypodensity within the left cerebellar hemisphere again seen. Minimal internal hyperdensity suggestive of possible petechial hemorrhage without frank hemorrhagic transformation. No significant mass effect. No other evidence for acute intracranial hemorrhage. No other large vessel territory infarct. No mass lesion or midline shift. Diffuse ventricular prominence with colpocephaly, stable. No frank hydrocephalus. No extra-axial fluid collection. Vascular: No hyperdense vessel. Scattered vascular calcifications noted within the carotid siphons. Skull: Scalp soft tissues within normal limits.  Calvarium intact. Sinuses: Paranasal sinuses are clear. Left greater than right mastoid effusions. Orbits:  Globes and orbital soft tissues within normal limits. Review of the MIP images confirms the above findings CTA NECK FINDINGS Aortic arch: Visualized aortic arch of normal caliber with normal branch pattern. Mild plaque within the arch itself. No flow-limiting stenosis about the origin of the great vessels. Partially visualized subclavian artery is widely patent. Right carotid system: Right common and internal carotid artery is widely patent without stenosis, dissection, or occlusion. No atheromatous narrowing about the right carotid bifurcation. Left carotid system: Left common and internal carotid arteries are widely patent without stenosis, dissection, or occlusion. Minimal atheromatous plaque about the left bifurcation without stenosis. Vertebral arteries: Both of the vertebral arteries arise from the subclavian arteries. Left vertebral artery dominant. Vertebral arteries widely patent within the neck without stenosis, dissection, or occlusion. Skeleton: No acute osseus abnormality. No worrisome lytic or blastic osseous lesions. Reversal of the normal cervical lordosis with sequelae of prior fusion at C4 through C7. Advanced degenerative spondylolysis present at C7-T1 and T1-T2. Other neck: No other acute soft tissue abnormality within the neck. No adenopathy. Salivary glands normal. Thyroid normal. Upper chest: Visualized upper mediastinum within normal limits. Layering bilateral pleural effusions with associated atelectasis, slightly larger on the left. Partially visualized lungs are otherwise grossly clear. Review of the MIP images confirms the above findings CTA HEAD FINDINGS Anterior circulation: Petrous segments widely patent bilaterally. Mild atheromatous plaque within the cavernous ICAs without flow-limiting stenosis. ICA termini widely patent. A1 segments patent. Normal anterior communicating artery. Anterior cerebral arteries patent to their distal aspects without stenosis. M1 segments patent without  stenosis or occlusion. No proximal M2 occlusion. Distal MCA branches well perfused and symmetric. Posterior circulation: Dominant left vertebral artery widely patent to the vertebrobasilar junction. Hypoplastic right vertebral artery patent as well without stenosis. Right PICA patent. Left PICA not well visualized. Basilar artery mildly tortuous but widely patent to its distal aspect. Superior cerebral arteries patent bilaterally. Left PCA supplied via the basilar. Predominant fetal type right PCA supplied via a widely patent right posterior communicating artery. PCAs widely patent to their distal aspects without stenosis. Venous sinuses: Abnormal filling defect present within knee left transverse and sigmoid sinuses, extending into the proximal left internal jugular vein, suspicious for venous sinus thrombosis. Name dural sinuses are otherwise patent. This is likely unchanged relative to recent CT from 01/01/2017, but appears new relative to prior CT from 10/24/2016. Anatomic variants: Fetal type right PCA. No aneurysm or vascular abnormality.  Delayed phase: Hypodensity within the left cerebellar hemisphere demonstrates no significant enhancement. There is question of adjacent 2 cm rim enhancing hypodensity about the petrous aspect of the left temporal bone (series 16, image 5). Dural sinus thrombosis involving the left transverse and sigmoid sinuses again noted. No other abnormal enhancement. Review of the MIP images confirms the above findings IMPRESSION: 1. Overall findings suggestive of acute left otomastoiditis complicated by dural venous thrombosis involving the left transverse and sigmoid sinuses as well as the left internal jugular vein. Adjacent hypodensity within the left cerebellar hemisphere may reflect associated venous infarct or possibly focal encephalitis. 2. Additional approximate 2 cm rim enhancing intracranial hypodensity positioned along the left petrous ridge at the level of the left temporal  bone, also suspicious for associated encephalitis/intracranial infection. Possible abscess may be present. 3. Otherwise negative CTA of the head and neck. 4. Bilateral pleural effusions with associated atelectasis. Electronically Signed   By: Jeannine Boga M.D.   On: 01/03/2017 19:59      Management plans discussed with the patient, family and they are in agreement.  CODE STATUS:     Code Status Orders  (From admission, onward)        Start     Ordered   01/01/17 1546  Do not attempt resuscitation (DNR)  Continuous    Question Answer Comment  In the event of cardiac or respiratory ARREST Do not call a "code blue"   In the event of cardiac or respiratory ARREST Do not perform Intubation, CPR, defibrillation or ACLS   In the event of cardiac or respiratory ARREST Use medication by any route, position, wound care, and other measures to relive pain and suffering. May use oxygen, suction and manual treatment of airway obstruction as needed for comfort.      01/01/17 1556    Code Status History    Date Active Date Inactive Code Status Order ID Comments User Context   01/01/2017 15:56 01/01/2017 15:56 Full Code [id number]  Epifanio Lesches, MD ED      TOTAL TIME TAKING CARE OF THIS PATIENT: 45 minutes.    Henreitta Leber M.D on 01/04/2017 at 12:22 PM  Between 7am to 6pm - Pager - [phone]  After 6pm go to [email or web address] - Proofreader  [facility]  Office  [phone]  CC: Primary care physician; Barbaraann Boys, MD

## 2017-01-04 NOTE — Progress Notes (Signed)
PT Cancellation Note  Patient Details Name: Dustin LainJohn Feliz Herard Turner MRN: [id number] DOB: 03/23/1935   Cancelled Treatment:    Reason Eval/Treat Not Completed: Fatigue/lethargy limiting ability to participate; Attempted PT eval this date with pt lethargic/obtunded and unable to respond to questions or follow any commands.  Will attempt to see pt at a future date as medically appropriate.     Ovidio Hanger. Scott Eastyn Skalla PT, DPT 01/04/17, 11:57 AM

## 2017-01-04 NOTE — Consult Note (Signed)
.. Dustin Turner, Dustin Turner [id number] 09/28/1935 [place] SirenSainani, Dustin J, MD  Reason for Consult: chronic mastoiditis with intracranial abscess  HPI: 81 y.o. Male with long standing history of left ear drainage and infection.  Admitted for possible CVA on 01/01/2017 from ED with several day history of confusion and poor oral intake.  Patient was on Augmentin prior for leg and ear infection.  Developed a possible seizure on 01/02/2017 p.m. And underwent CTA of head and neck.  This revealed mastoiditis with intracranial extension, sigmoid sinus thrombosis.  Worsening cognitive status per family with now being unresponsive except for pain.  Prior spinal cord stimulator and unable to complete MRI.  Allergies:  Allergies  Allergen Reactions  . Amitriptyline   . Nortriptyline Other (See Comments)    GI  upset    ROS: Review of systems normal other than 12 systems except per HPI.  PMH:  Past Medical History:  Diagnosis Date  . Arthritis   . BPH (benign prostatic hyperplasia)   . DDD (degenerative disc disease), cervical   . Hypogonadism in male   . Insomnia   . Neurogenic bladder   . OP (osteoporosis)   . Scoliosis   . Spinal stenosis   . Urinary urgency   . Venous stasis     FH:  Family History  Problem Relation Age of Onset  . Diabetes Father   . Stroke Father   . Alcohol abuse Father   . Diabetes Brother   . Stroke Mother   . Alcohol abuse Brother   . Kidney disease Neg Hx   . Prostate cancer Neg Hx     SH:  Social History   Socioeconomic History  . Marital status: Married    Spouse name: Not on file  . Number of children: Not on file  . Years of education: Not on file  . Highest education level: Not on file  Social Needs  . Financial resource strain: Not on file  . Food insecurity - worry: Not on file  . Food insecurity - inability: Not on file  . Transportation needs - medical: Not on file  . Transportation needs - non-medical: Not on file  Occupational History  . Not on file   Tobacco Use  . Smoking status: Never Smoker  . Smokeless tobacco: Never Used  Substance and Sexual Activity  . Alcohol use: No    Alcohol/week: 0.0 oz  . Drug use: No  . Sexual activity: Not on file  Other Topics Concern  . Not on file  Social History Narrative  . Not on file    PSH:  Past Surgical History:  Procedure Laterality Date  . CERVICAL SPINE SURGERY    . HERNIA REPAIR    . JOINT REPLACEMENT    . LUMBAR SPINE SURGERY    . Proleive System Treatment  2006  . ROTATOR CUFF REPAIR    . THORACIC SPINE SURGERY      Physical  Exam: GEN-  Obtunded and unresponsive to stimuli except for pain EARS-  Erythematous canal and drum on left, no fluctuance behind mastoid but tenderness with palpation of mastoid. NEURO-  Responsive to pain only on my exam OC/OP-  Limited due to cooperation, xerostomia NECK-  c-collar in place, no obvious palpable abnormalities  CT Angio Neck/Head 12/20-  acute left otomastoiditis complicated by dural venous thrombosis involving the left transverse and sigmoid sinuses as well as the left internal jugular vein. Adjacent hypodensity within the left cerebellar hemisphere may reflect associated venous infarct or  possibly focal encephalitis. 2. Additional approximate 2 cm rim enhancing intracranial hypodensity positioned along the left petrous ridge at the level of the left temporal bone, also suspicious for associated encephalitis/intracranial infection. Possible abscess may be present.  A/P: Chronic mastoiditis with acute intracranial extension through petrous apex and intracranial abscess and sigmoid sinus thrombosis  Plan:  Longstanding history of chronic drainage but now with intracranial complications of mastoiditis with worsening mental status changes.  Discussed with Dr. Melrose Turner with Neurosurgery and patient most likely will need at least a coritcal mastoidectomy and +/- of intracranial drainage.  Given presence of intracranial  extension/current mental status recommend tertiary care center with neurosurgical ICU as most likely will need both Otology and Neurosurgical intervention.  Urgent transfer discussed and will be initiated.  Patient currently being brought down to CT.   Dustin Turner 01/04/2017 11:26 AM

## 2017-01-04 NOTE — Progress Notes (Signed)
Pt able to take medication whole in ice cream without difficulty.  Pt appears to be hungry and thirsty. Fed pt all of ice cream and notified pt's RN to follow up on changing diet order. Henriette CombsSarah Anaih Brander RN

## 2017-01-05 MED ORDER — GENERIC EXTERNAL MEDICATION
500.00 | Status: DC
Start: 2017-01-06 — End: 2017-01-05

## 2017-01-05 MED ORDER — KCL IN DEXTROSE-NACL 20-5-0.45 MEQ/L-%-% IV SOLN
INTRAVENOUS | Status: DC
Start: ? — End: 2017-01-05

## 2017-01-05 MED ORDER — DEXMEDETOMIDINE HCL IN NACL 400 MCG/100ML IV SOLN
0.20 | INTRAVENOUS | Status: DC
Start: ? — End: 2017-01-05

## 2017-01-05 MED ORDER — GENERIC EXTERNAL MEDICATION
2.00 | Status: DC
Start: 2017-01-10 — End: 2017-01-05

## 2017-01-05 MED ORDER — GENERIC EXTERNAL MEDICATION
Status: DC
Start: ? — End: 2017-01-05

## 2017-01-05 MED ORDER — GENERIC EXTERNAL MEDICATION
40.00 | Status: DC
Start: 2017-01-06 — End: 2017-01-05

## 2017-01-05 MED ORDER — GENERIC EXTERNAL MEDICATION
250.00 | Status: DC
Start: 2017-01-05 — End: 2017-01-05

## 2017-01-05 MED ORDER — HEPARIN (PORCINE) IN NACL 100-0.45 UNIT/ML-% IJ SOLN
1900.00 | INTRAMUSCULAR | Status: DC
Start: ? — End: 2017-01-05

## 2017-01-05 MED ORDER — CIPROFLOXACIN-DEXAMETHASONE 0.3-0.1 % OT SUSP
3.00 | OTIC | Status: DC
Start: 2017-01-10 — End: 2017-01-05

## 2017-01-05 MED ORDER — ASPIRIN 81 MG PO CHEW
81.00 | CHEWABLE_TABLET | ORAL | Status: DC
Start: 2017-01-06 — End: 2017-01-05

## 2017-01-05 MED ORDER — GENERIC EXTERNAL MEDICATION
2.00 | Status: DC
Start: 2017-01-05 — End: 2017-01-05

## 2017-01-05 NOTE — Progress Notes (Signed)
Report given to transporting RN. [facility]

## 2017-01-05 NOTE — Progress Notes (Signed)
Report for patient given to Charge nurse at receiving hospital [name])

## 2017-01-05 NOTE — Progress Notes (Signed)
Patient [facility] via [facility]

## 2017-01-10 MED ORDER — DULOXETINE HCL 30 MG PO CPEP
30.00 | ORAL_CAPSULE | ORAL | Status: DC
Start: 2017-01-10 — End: 2017-01-10

## 2017-01-10 MED ORDER — LIDOCAINE HCL 1 % IJ SOLN
0.50 | INTRAMUSCULAR | Status: DC
Start: ? — End: 2017-01-10

## 2017-01-10 MED ORDER — SENNOSIDES-DOCUSATE SODIUM 8.6-50 MG PO TABS
2.00 | ORAL_TABLET | ORAL | Status: DC
Start: 2017-01-10 — End: 2017-01-10

## 2017-01-10 MED ORDER — CLONAZEPAM 0.25 MG PO TBDP
0.50 | ORAL_TABLET | ORAL | Status: DC
Start: 2017-01-10 — End: 2017-01-10

## 2017-01-10 MED ORDER — METRONIDAZOLE 250 MG PO TABS
500.00 | ORAL_TABLET | ORAL | Status: DC
Start: 2017-01-10 — End: 2017-01-10

## 2017-01-10 MED ORDER — POLYETHYLENE GLYCOL 3350 17 G PO PACK
17.00 | PACK | ORAL | Status: DC
Start: 2017-01-10 — End: 2017-01-10

## 2017-01-10 MED ORDER — CALCIUM-VITAMIN D3 250-125 MG-UNIT PO TABS
1.00 | ORAL_TABLET | ORAL | Status: DC
Start: 2017-01-10 — End: 2017-01-10

## 2017-01-10 MED ORDER — DULOXETINE HCL 60 MG PO CPEP
60.00 | ORAL_CAPSULE | ORAL | Status: DC
Start: 2017-01-10 — End: 2017-01-10

## 2017-01-10 MED ORDER — GENERIC EXTERNAL MEDICATION
1.50 | Status: DC
Start: 2017-01-10 — End: 2017-01-10

## 2017-01-10 MED ORDER — GABAPENTIN 400 MG PO CAPS
800.00 | ORAL_CAPSULE | ORAL | Status: DC
Start: 2017-01-10 — End: 2017-01-10

## 2017-01-10 MED ORDER — GENERIC EXTERNAL MEDICATION
750.00 | Status: DC
Start: 2017-01-10 — End: 2017-01-10

## 2017-01-10 MED ORDER — ACETAMINOPHEN 325 MG PO TABS
975.00 | ORAL_TABLET | ORAL | Status: DC
Start: ? — End: 2017-01-10

## 2017-01-10 MED ORDER — ENOXAPARIN SODIUM 80 MG/0.8ML ~~LOC~~ SOLN
1.00 | SUBCUTANEOUS | Status: DC
Start: 2017-01-10 — End: 2017-01-10

## 2017-02-11 ENCOUNTER — Ambulatory Visit: Payer: Self-pay

## 2017-03-06 ENCOUNTER — Encounter
Admission: RE | Admit: 2017-03-06 | Discharge: 2017-03-06 | Disposition: A | Discharge status: Home / Self Care | Payer: Medicare Other | Source: Ambulatory Visit | Attending: Internal Medicine | Admitting: Internal Medicine

## 2017-03-15 ENCOUNTER — Encounter
Admission: RE | Admit: 2017-03-15 | Discharge: 2017-03-15 | Disposition: A | Discharge status: Home / Self Care | Payer: Medicare Other | Source: Ambulatory Visit | Attending: Internal Medicine | Admitting: Internal Medicine

## 2017-03-15 DIAGNOSIS — G08 Intracranial and intraspinal phlebitis and thrombophlebitis: Secondary | ICD-10-CM | POA: Insufficient documentation

## 2017-03-18 ENCOUNTER — Other Ambulatory Visit
Admission: RE | Admit: 2017-03-18 | Discharge: 2017-03-18 | Disposition: A | Discharge status: Home / Self Care | Payer: Medicare Other | Source: Ambulatory Visit | Attending: Gerontology | Admitting: Gerontology

## 2017-03-18 DIAGNOSIS — H7012 Chronic mastoiditis, left ear: Secondary | ICD-10-CM | POA: Diagnosis present

## 2017-03-18 LAB — COMPREHENSIVE METABOLIC PANEL
ALT: 19 U/L (ref 17–63)
ANION GAP: 8 (ref 5–15)
AST: 23 U/L (ref 15–41)
Albumin: 3.4 g/dL — ABNORMAL LOW (ref 3.5–5.0)
Alkaline Phosphatase: 75 U/L (ref 38–126)
BUN: 23 mg/dL — ABNORMAL HIGH (ref 6–20)
CHLORIDE: 100 mmol/L — AB (ref 101–111)
CO2: 29 mmol/L (ref 22–32)
Calcium: 8.9 mg/dL (ref 8.9–10.3)
Creatinine, Ser: 0.67 mg/dL (ref 0.61–1.24)
GFR calc non Af Amer: >60 mL/min (ref >60)
Glucose, Bld: 101 mg/dL — ABNORMAL HIGH (ref 65–99)
Potassium: 4.1 mmol/L (ref 3.5–5.1)
SODIUM: 137 mmol/L (ref 135–145)
Total Bilirubin: 0.8 mg/dL (ref 0.3–1.2)
Total Protein: 6.5 g/dL (ref 6.5–8.1)

## 2017-03-18 LAB — CBC WITH DIFFERENTIAL/PLATELET
BASOS PCT: 0 %
Basophils Absolute: 0 10*3/uL (ref 0–0.1)
EOS ABS: 0.2 10*3/uL (ref 0–0.7)
EOS PCT: 4 %
HCT: 38.2 % — ABNORMAL LOW (ref 40.0–52.0)
Hemoglobin: 12.7 g/dL — ABNORMAL LOW (ref 13.0–18.0)
Lymphocytes Relative: 24 %
Lymphs Abs: 1.1 10*3/uL (ref 1.0–3.6)
MCH: 31.1 pg (ref 26.0–34.0)
MCHC: 33.4 g/dL (ref 32.0–36.0)
MCV: 93.3 fL (ref 80.0–100.0)
MONOS PCT: 12 %
Monocytes Absolute: 0.6 10*3/uL (ref 0.2–1.0)
NEUTROS PCT: 60 %
Neutro Abs: 2.8 10*3/uL (ref 1.4–6.5)
PLATELETS: 179 10*3/uL (ref 150–440)
RBC: 4.09 MIL/uL — ABNORMAL LOW (ref 4.40–5.90)
RDW: 16 % — AB (ref 11.5–14.5)
WBC: 4.7 10*3/uL (ref 3.8–10.6)

## 2017-03-18 LAB — MAGNESIUM: Magnesium: 2.2 mg/dL (ref 1.7–2.4)

## 2017-03-18 LAB — VITAMIN B12: VITAMIN B 12: 758 pg/mL (ref 180–914)

## 2017-03-18 LAB — TSH: TSH: 1.844 u[IU]/mL (ref 0.350–4.500)

## 2017-03-19 LAB — VITAMIN D 25 HYDROXY (VIT D DEFICIENCY, FRACTURES): Vit D, 25-Hydroxy: 34.1 ng/mL (ref 30.0–100.0)

## 2017-04-05 DIAGNOSIS — N3281 Overactive bladder: Secondary | ICD-10-CM | POA: Insufficient documentation

## 2017-04-10 ENCOUNTER — Non-Acute Institutional Stay (SKILLED_NURSING_FACILITY): Payer: Medicare Other | Admitting: Gerontology

## 2017-04-10 ENCOUNTER — Encounter: Payer: Self-pay | Admitting: Gerontology

## 2017-04-10 DIAGNOSIS — I10 Essential (primary) hypertension: Secondary | ICD-10-CM | POA: Diagnosis not present

## 2017-04-10 DIAGNOSIS — I639 Cerebral infarction, unspecified: Secondary | ICD-10-CM

## 2017-04-10 DIAGNOSIS — I872 Venous insufficiency (chronic) (peripheral): Secondary | ICD-10-CM | POA: Diagnosis not present

## 2017-04-10 NOTE — Assessment & Plan Note (Signed)
Stable. No current ulcers or wounds. No current medicinal treatment

## 2017-04-10 NOTE — Assessment & Plan Note (Signed)
Stable. Some generalized weakness. No specific deficits noted. On Xarelto 20 mg po Q HS. No S/S of bleeding

## 2017-04-10 NOTE — Assessment & Plan Note (Signed)
Stable. No recent episodes of hyper or hypotension. B/P controlled without use of antihypertensives at this point. Pt denies chest pain or shortness of breath.

## 2017-04-10 NOTE — Progress Notes (Signed)
[facility]:   [facility] Room Number: [phone] Place of Service:  SNF [phone] Provider:  Lorenso QuarryShannon Joeanna Howdyshell, NP-C  Orene DesanctisBehling, Karen, MD  Patient Care Team: Orene DesanctisBehling, Karen, MD as PCP - General (Pediatrics) Verner MouldFowler, Vickie A, MD (Family Medicine)  Extended Emergency Contact Information Primary Emergency Contact: [facility],Paige Address: [address]          [place], [place] [address] [place] Home Phone: [phone] Mobile Phone: [phone] Relation: Daughter Secondary Emergency Contact: Forde RadonBurns,Sara Address: [address]          [place], [place] [address] [place] Home Phone: [phone] Relation: Spouse  Code Status:  DNR Goals of care: Advanced Directive information Advanced Directives 04/10/2017  Does Patient Have a Medical Advance Directive? Yes  Type of Advance Directive Out of facility DNR (pink MOST or yellow form)  Does patient want to make changes to medical advance directive? No - Patient declined  Copy of Healthcare Power of Attorney in Chart? -  Would patient like information on creating a medical advance directive? -     Chief Complaint  Patient presents with  . Medical Management of Chronic Issues    Routine Visit    HPI:  Pt is a 82 y.o. male seen today for medical management of chronic diseases.    Chronic venous insufficiency Stable. No current ulcers or wounds. No current medicinal treatment  Cerebellar stroke (HCC) Stable. Some generalized weakness. No specific deficits noted. On Xarelto 20 mg po Q HS. No S/S of bleeding  Essential hypertension Stable. No recent episodes of hyper or hypotension. B/P controlled without use of antihypertensives at this point. Pt denies chest pain or shortness of breath.   Past Medical History:  Diagnosis Date  . Abdominal pain    other specified site  . Abnormal gait   . Advance directive in chart 10/12/2015  . Anemia   . Arthritis   . BPH (benign prostatic hyperplasia)   .  Cataract   . Cervical spondylosis without myelopathy   . Cognitive dysfunction   . DDD (degenerative disc disease), cervical 06/29/2013  . Degeneration of cervical intervertebral disc   . Degeneration of lumbar intervertebral disc   . Depression   . Excessive daytime sleepiness   . Generalized osteoarthritis of multiple sites 11/19/2013  . GERD (gastroesophageal reflux disease)   . Hallux varus, acquired   . Hiatal hernia   . Hyperlipidemia   . Hypertension   . Hypogonadism in male   . Impaired functional mobility, balance, gait, and endurance 11/14/2015   uses motorized wheelchair  . Insomnia 06/29/2013  . Low back pain   . Lumbosacral spondylosis without myelopathy   . Memory loss   . Mild cognitive impairment   . Neurogenic bladder   . OP (osteoporosis)   . Organic sleep apnea   . Osteoporosis 03/15/2004  . Peripheral neuropathy   . Radial nerve palsy   . Scoliosis 06/29/2013  . Seizure (HCC) 07/31/2016  . Sleep apnea    CPAP to bring DOS sleep study done in [place]  . Spinal compression fracture (HCC)   . Spinal stenosis   . Spondylolisthesis, acquired   . Urinary incontinence, stress, male 08/10/2015  . Urinary urgency   . Uses hearing aid 10/12/2015  . Venous stasis   . Venous stasis dermatitis of both lower extremities 06/29/2013  . Vision abnormalities   . Vitamin B12 deficiency 09/27/2016  . Wrist drop, bilateral    Past Surgical  History:  Procedure Laterality Date  . CARPAL TUNNEL RELEASE Left 03/16/2016   Dr. Kennedy Bucker  . CERVICAL SPINE SURGERY    . CORNEAL EYE SURGERY Right 09/08/2014   PTK (Dr. Chales Abrahams)  . HEMORRHOID SURGERY    . HERNIA REPAIR     left side  . INTRATHECAL PUMP IMPLANTATION  04/28/2014   Procedure: REVISION INTRATHECAL CATH; Surgeon: Burman Blacksmith, MD; Location: [facility] OR; Service: Anes/ Pain Mgmt; Laterality: N/A;  . JOINT REPLACEMENT Bilateral    bilateral knee replacements  . LUMBAR SPINE SURGERY    . PAIN PUMP  IMPLANTATION     multiple procedures for removal and insertion this is his 3rd pain pump  . PAIN PUMP REMOVAL     multiple procedures for removal and insertion this is his 3rd pain pump  . Proleive System Treatment  2006  . ROTATOR CUFF REPAIR Right    x2  . SPINAL CORD STIMULATOR IMPLANT     x2  . THORACIC SPINE SURGERY    . TONSILLECTOMY      Allergies  Allergen Reactions  . Amitriptyline Rash  . Nortriptyline Other (See Comments) and Rash    GI  upset    Allergies as of 04/10/2017      Reactions   Amitriptyline Rash   Nortriptyline Other (See Comments), Rash   GI  upset      Medication List        Accurate as of 04/10/17  2:59 PM. Always use your most recent med list.          acetaminophen 325 MG tablet Commonly known as:  TYLENOL Take 975 mg by mouth every 8 (eight) hours as needed.   alendronate 70 MG tablet Commonly known as:  FOSAMAX Take 70 mg by mouth once a week. On Thursday   alum & mag hydroxide-simeth 400-400-40 MG/5ML suspension Commonly known as:  MAALOX PLUS Take 30 mLs by mouth every 4 (four) hours as needed.   ammonium lactate 12 % lotion Commonly known as:  LAC-HYDRIN Apply 1 application topically 2 (two) times daily.   calcium carbonate 1500 (600 Ca) MG Tabs tablet Commonly known as:  OSCAL Take 600 mg of elemental calcium by mouth daily.   docusate sodium 100 MG capsule Commonly known as:  COLACE Take 100 mg by mouth daily.   docusate sodium 100 MG capsule Commonly known as:  COLACE Take 100 mg by mouth daily.   DULoxetine 30 MG capsule Commonly known as:  CYMBALTA Take 30 mg by mouth at bedtime.   eucerin cream Wash feet with soap and water, dry. Apply Eucerin cream to dry, scaly skin daily, prn   gabapentin 800 MG tablet Commonly known as:  NEURONTIN Take 800 mg by mouth 3 (three) times daily.   levETIRAcetam 750 MG tablet Commonly known as:  KEPPRA Take 750 mg by mouth 2 (two) times daily.   mirabegron ER 50 MG Tb24  tablet Commonly known as:  MYRBETRIQ Take 1 tablet (50 mg total) by mouth daily.   MOVANTIK 12.5 MG Tabs tablet Generic drug:  naloxegol oxalate Take 12.5 mg by mouth every morning.   polyethylene glycol packet Commonly known as:  MIRALAX / GLYCOLAX Take 17 g by mouth daily.   ranitidine 75 MG tablet Commonly known as:  ZANTAC Take 75 mg by mouth 2 (two) times daily.   rivaroxaban 20 MG Tabs tablet Commonly known as:  XARELTO Take 20 mg by mouth daily with supper.   sennosides-docusate sodium  8.6-50 MG tablet Commonly known as:  SENOKOT-S Take 2 tablets by mouth 2 (two) times daily.   tamsulosin 0.4 MG Caps capsule Commonly known as:  FLOMAX Take 0.4 mg by mouth daily.   vitamin C 500 MG tablet Commonly known as:  ASCORBIC ACID Take 500 mg by mouth 2 (two) times daily.   VOLTAREN 1 % Gel Generic drug:  diclofenac sodium Apply 2-4 g topically 3 (three) times daily as needed.       Review of Systems  Constitutional: Negative for activity change, appetite change, chills, diaphoresis and fever.  HENT: Negative for congestion, mouth sores, nosebleeds, postnasal drip, sneezing, sore throat, trouble swallowing and voice change.   Respiratory: Negative for apnea, cough, choking, chest tightness, shortness of breath and wheezing.   Cardiovascular: Negative for chest pain, palpitations and leg swelling.  Gastrointestinal: Negative for abdominal distention, abdominal pain, constipation, diarrhea and nausea.  Genitourinary: Negative for difficulty urinating, dysuria, frequency and urgency.  Musculoskeletal: Positive for arthralgias (typical arthritis), gait problem and myalgias. Negative for back pain.  Skin: Negative for color change, pallor, rash and wound.  Neurological: Positive for weakness. Negative for dizziness, tremors, syncope, speech difficulty, numbness and headaches.  Psychiatric/Behavioral: Negative for agitation and behavioral problems.  All other systems reviewed  and are negative.   Immunization History  Administered Date(s) Administered  . Influenza Split 09/27/2012  . Influenza, High Dose Seasonal PF 10/04/2016  . Influenza-Unspecified 10/05/2013, 10/04/2014, 09/09/2015, 10/04/2016  . Pneumococcal Conjugate-13 07/29/2013  . Tdap 05/31/2008  . Zoster 01/18/2006  . Zoster Recombinat (Shingrix) 05/30/2016, 10/19/2016   Pertinent  Health Maintenance Due  Topic Date Due  . PNA vac Low Risk Adult (1 of 2 - PCV13) 06/18/2002  . INFLUENZA VACCINE  Completed   No flowsheet data found. Functional Status Survey:    Vitals:   04/10/17 1241  BP: 133/71  Pulse: 74  Resp: 20  Temp: 98.2 F (36.8 C)  TempSrc: Oral  SpO2: 97%  Weight: 163 lb 14.4 oz (74.3 kg)  Height: 5\' 9"  (1.753 m)   Body mass index is 24.2 kg/m. Physical Exam  Constitutional: He is oriented to person, place, and time. Vital signs are normal. He appears well-developed and well-nourished. He is active and cooperative. He does not appear ill. No distress.  HENT:  Head: Normocephalic and atraumatic.  Mouth/Throat: Uvula is midline, oropharynx is clear and moist and mucous membranes are normal. Mucous membranes are not pale, not dry and not cyanotic.  Eyes: Pupils are equal, round, and reactive to light. Conjunctivae, EOM and lids are normal.  Neck: Trachea normal, normal range of motion and full passive range of motion without pain. Neck supple. No JVD present. No tracheal deviation, no edema and no erythema present. No thyromegaly present.  Cardiovascular: Normal rate, regular rhythm, normal heart sounds, intact distal pulses and normal pulses. Exam reveals no gallop, no distant heart sounds and no friction rub.  No murmur heard. Pulses:      Dorsalis pedis pulses are 2+ on the right side, and 2+ on the left side.  No edema  Pulmonary/Chest: Effort normal and breath sounds normal. No accessory muscle usage. No respiratory distress. He has no decreased breath sounds. He has no  wheezes. He has no rhonchi. He has no rales. He exhibits no tenderness.  Abdominal: Soft. Normal appearance and bowel sounds are normal. He exhibits no distension and no ascites. There is no tenderness.  Musculoskeletal: Normal range of motion. He exhibits no edema or tenderness.  Expected osteoarthritis, stiffness; Bilateral Calves soft, supple. Negative Homan's Sign. B- pedal pulses equal; generalized weakness, ambulates with rolling walker, mobile in wheelchair  Neurological: He is alert and oriented to person, place, and time. He has normal strength. He displays atrophy. A sensory deficit is present. Coordination and gait abnormal.  Skin: Skin is warm, dry and intact. He is not diaphoretic. No cyanosis. No pallor. Nails show no clubbing.  Psychiatric: He has a normal mood and affect. His speech is normal and behavior is normal. Thought content normal. Cognition and memory are impaired. He expresses impulsivity and inappropriate judgment. He exhibits abnormal recent memory.  Nursing note and vitals reviewed.   Labs reviewed: Recent Labs    01/03/17 0000 01/03/17 0537 03/18/17 0505  NA 127* 134* 137  K 3.3* 3.8 4.1  CL 93* 92* 100*  CO2 31 32 29  GLUCOSE 151* 138* 101*  BUN 18 18 23*  CREATININE 0.76 0.69 0.67  CALCIUM 7.6* 8.4* 8.9  MG 1.8 1.9 2.2   Recent Labs    01/01/17 1239 03/18/17 0505  AST 38 23  ALT 35 19  ALKPHOS 110 75  BILITOT 0.5 0.8  PROT 6.9 6.5  ALBUMIN 3.1* 3.4*   Recent Labs    01/02/17 0535 01/04/17 0055 03/18/17 0505  WBC 11.7* 9.0 4.7  NEUTROABS  --   --  2.8  HGB 12.2* 12.1* 12.7*  HCT 35.1* 36.2* 38.2*  MCV 92.6 93.2 93.3  PLT 280 295 179   Lab Results  Component Value Date   TSH 1.844 03/18/2017   Lab Results  Component Value Date   HGBA1C 6.2 (H) 01/02/2017   Lab Results  Component Value Date   CHOL 97 01/03/2017   HDL 44 01/03/2017   LDLCALC 46 01/03/2017   TRIG 33 01/03/2017   CHOLHDL 2.2 01/03/2017    Significant  Diagnostic Results in last 30 days:  No results found.  Assessment/Plan  Chronic venous insufficiency  Cerebellar stroke [facility])  Essential hypertension   Above listed conditions stable  Continue current medication regimen  Monitor for hyper/hypotension  Safety precautions  Fall risk  Encourage pt to ask for assistance when needing to get up  Encourage participation in activities and interaction with other residents  Family/ staff Communication:   Total Time:  Documentation:  Face to Face:  Family/Phone:   Labs/tests ordered:  Recent labs reviewed  Medication list reviewed and assessed for continued appropriateness. Monthly medication orders reviewed and signed.  Brynda Rim, NP-C Geriatrics [facility] [phone] [address], [place] [address] Cell Phone (Mon-Fri 8am-5pm):  [phone] On Call:  [phone] & follow prompts after 5pm & weekends Office Phone:  [phone] Office Fax:  [phone]

## 2017-04-11 ENCOUNTER — Other Ambulatory Visit: Payer: Self-pay | Admitting: Otolaryngology

## 2017-04-11 DIAGNOSIS — J3800 Paralysis of vocal cords and larynx, unspecified: Secondary | ICD-10-CM

## 2017-04-11 DIAGNOSIS — R05 Cough: Secondary | ICD-10-CM

## 2017-04-11 DIAGNOSIS — R490 Dysphonia: Secondary | ICD-10-CM

## 2017-04-11 DIAGNOSIS — R059 Cough, unspecified: Secondary | ICD-10-CM

## 2017-04-11 DIAGNOSIS — I676 Nonpyogenic thrombosis of intracranial venous system: Secondary | ICD-10-CM

## 2017-04-11 DIAGNOSIS — R498 Other voice and resonance disorders: Secondary | ICD-10-CM

## 2017-04-15 ENCOUNTER — Encounter
Admission: RE | Admit: 2017-04-15 | Discharge: 2017-04-15 | Disposition: A | Discharge status: Home / Self Care | Payer: Medicare Other | Source: Ambulatory Visit | Attending: Internal Medicine | Admitting: Internal Medicine

## 2017-04-23 ENCOUNTER — Encounter: Payer: Self-pay | Admitting: Gerontology

## 2017-04-23 ENCOUNTER — Non-Acute Institutional Stay (SKILLED_NURSING_FACILITY): Payer: Medicare Other | Admitting: Gerontology

## 2017-04-23 ENCOUNTER — Ambulatory Visit
Admission: RE | Admit: 2017-04-23 | Discharge: 2017-04-23 | Disposition: A | Discharge status: Home / Self Care | Payer: Medicare Other | Source: Ambulatory Visit | Attending: Otolaryngology | Admitting: Otolaryngology

## 2017-04-23 ENCOUNTER — Ambulatory Visit: Payer: Medicare Other

## 2017-04-23 DIAGNOSIS — L03116 Cellulitis of left lower limb: Secondary | ICD-10-CM | POA: Diagnosis not present

## 2017-04-23 DIAGNOSIS — H7092 Unspecified mastoiditis, left ear: Secondary | ICD-10-CM | POA: Diagnosis not present

## 2017-04-23 DIAGNOSIS — J3800 Paralysis of vocal cords and larynx, unspecified: Secondary | ICD-10-CM | POA: Diagnosis not present

## 2017-04-23 DIAGNOSIS — J9811 Atelectasis: Secondary | ICD-10-CM | POA: Insufficient documentation

## 2017-04-23 DIAGNOSIS — K449 Diaphragmatic hernia without obstruction or gangrene: Secondary | ICD-10-CM | POA: Diagnosis not present

## 2017-04-23 DIAGNOSIS — R05 Cough: Secondary | ICD-10-CM | POA: Insufficient documentation

## 2017-04-23 DIAGNOSIS — S90422A Blister (nonthermal), left great toe, initial encounter: Secondary | ICD-10-CM | POA: Diagnosis not present

## 2017-04-23 DIAGNOSIS — R490 Dysphonia: Secondary | ICD-10-CM | POA: Diagnosis not present

## 2017-04-23 DIAGNOSIS — I676 Nonpyogenic thrombosis of intracranial venous system: Secondary | ICD-10-CM | POA: Diagnosis present

## 2017-04-23 DIAGNOSIS — R059 Cough, unspecified: Secondary | ICD-10-CM

## 2017-04-23 MED ORDER — IOHEXOL 300 MG/ML  SOLN
75.0000 mL | Freq: Once | INTRAMUSCULAR | Status: AC | PRN
  Administered 2017-04-23: 75 mL via INTRAVENOUS

## 2017-04-23 NOTE — Progress Notes (Signed)
Location:   [facility] Room Number: [phone] Place of Service:  SNF [phone]) Provider:  Lorenso Quarry, NP-C  Orene Desanctis, MD  Patient Care Team: Orene Desanctis, MD as PCP - General (Pediatrics) Verner Mould, MD (Family Medicine)  Extended Emergency Contact Information Primary Emergency Contact: [facility] Address: [address]          [place], [place] [address] Darden Amber of [place] Home Phone: [phone] Mobile Phone: [phone] Relation: Daughter Secondary Emergency Contact: Forde Radon Address: [address]          [place], [place] [address] Darden [place] of [place] Home Phone: [phone] Relation: Spouse  Code Status:  DNR Goals of care: Advanced Directive information Advanced Directives 04/23/2017  Does Patient Have a Medical Advance Directive? Yes  Type of Advance Directive Out of facility DNR (pink MOST or yellow form)  Does patient want to make changes to medical advance directive? No - Patient declined  Copy of Healthcare Power of Attorney in Chart? -  Would patient like information on creating a medical advance directive? -     Chief Complaint  Patient presents with  . Acute Visit    Blister on foot    HPI:  Pt is a 82 y.o. male seen today for an acute visit for evaluation of blood filled blister on plantar surface of the left great toe. Pt reports to nursing the area is painful, but denies pain during interview. Blister is approx 3 cm. diameter, bullous intact. Surrounding erythema on ventral and dorsal aspect of foot extending to midfoot area. Area of erythema is mildly warm to touch, moderately deep red colored, non-tender to touch with 2+ pitting edema. Pt already has a h/o venous insufficiency with baseline 1 + pitting edema- on the RLE. TED Hose in place. Legs elevated. Small Allevyn patch covering the blister. B- pedal pulses 2+, equal. All toes freely mobile. Full ROM of ankles. Calves soft, supple. Negative Homan's sign. VSS. No  other complaints.      Past Medical History:  Diagnosis Date  . Abdominal pain    other specified site  . Abnormal gait   . Advance directive in chart 10/12/2015  . Anemia   . Arthritis   . BPH (benign prostatic hyperplasia)   . Cataract   . Cervical spondylosis without myelopathy   . Cognitive dysfunction   . DDD (degenerative disc disease), cervical 06/29/2013  . Degeneration of cervical intervertebral disc   . Degeneration of lumbar intervertebral disc   . Depression   . Excessive daytime sleepiness   . Generalized osteoarthritis of multiple sites 11/19/2013  . GERD (gastroesophageal reflux disease)   . Hallux varus, acquired   . Hiatal hernia   . Hyperlipidemia   . Hypertension   . Hypogonadism in male   . Impaired functional mobility, balance, gait, and endurance 11/14/2015   uses motorized wheelchair  . Insomnia 06/29/2013  . Low back pain   . Lumbosacral spondylosis without myelopathy   . Memory loss   . Mild cognitive impairment   . Neurogenic bladder   . OP (osteoporosis)   . Organic sleep apnea   . Osteoporosis 03/15/2004  . Peripheral neuropathy   . Radial nerve palsy   . Scoliosis 06/29/2013  . Seizure (HCC) 07/31/2016  . Sleep apnea    CPAP to bring DOS sleep study done in [place]  . Spinal compression fracture (HCC)   . Spinal stenosis   . Spondylolisthesis, acquired   . Urinary incontinence,  stress, male 08/10/2015  . Urinary urgency   . Uses hearing aid 10/12/2015  . Venous stasis   . Venous stasis dermatitis of both lower extremities 06/29/2013  . Vision abnormalities   . Vitamin B12 deficiency 09/27/2016  . Wrist drop, bilateral    Past Surgical History:  Procedure Laterality Date  . CARPAL TUNNEL RELEASE Left 03/16/2016   Dr. Kennedy Bucker  . CERVICAL SPINE SURGERY    . CORNEAL EYE SURGERY Right 09/08/2014   PTK (Dr. Chales Abrahams)  . HEMORRHOID SURGERY    . HERNIA REPAIR     left side  . INTRATHECAL PUMP IMPLANTATION  04/28/2014    Procedure: REVISION INTRATHECAL CATH; Surgeon: Burman Blacksmith, MD; Location: [facility] OR; Service: Anes/ Pain Mgmt; Laterality: N/A;  . JOINT REPLACEMENT Bilateral    bilateral knee replacements  . LUMBAR SPINE SURGERY    . PAIN PUMP IMPLANTATION     multiple procedures for removal and insertion this is his 3rd pain pump  . PAIN PUMP REMOVAL     multiple procedures for removal and insertion this is his 3rd pain pump  . Proleive System Treatment  2006  . ROTATOR CUFF REPAIR Right    x2  . SPINAL CORD STIMULATOR IMPLANT     x2  . THORACIC SPINE SURGERY    . TONSILLECTOMY      Allergies  Allergen Reactions  . Amitriptyline Rash  . Nortriptyline Other (See Comments) and Rash    GI  upset    Allergies as of 04/23/2017      Reactions   Amitriptyline Rash   Nortriptyline Other (See Comments), Rash   GI  upset      Medication List        Accurate as of 04/23/17 11:03 AM. Always use your most recent med list.          acetaminophen 325 MG tablet Commonly known as:  TYLENOL Take 975 mg by mouth every 8 (eight) hours as needed.   alendronate 70 MG tablet Commonly known as:  FOSAMAX Take 70 mg by mouth once a week. On Thursday   alum & mag hydroxide-simeth 400-400-40 MG/5ML suspension Commonly known as:  MAALOX PLUS Take 30 mLs by mouth every 4 (four) hours as needed.   ammonium lactate 12 % lotion Commonly known as:  LAC-HYDRIN Apply 1 application topically 2 (two) times daily.   calcium carbonate 1500 (600 Ca) MG Tabs tablet Commonly known as:  OSCAL Take 600 mg of elemental calcium by mouth daily.   docusate sodium 100 MG capsule Commonly known as:  COLACE Take 100 mg by mouth daily.   DULoxetine 30 MG capsule Commonly known as:  CYMBALTA Take 30 mg by mouth at bedtime.   eucerin cream Wash feet with soap and water, dry. Apply Eucerin cream to dry, scaly skin daily, prn   gabapentin 800 MG tablet Commonly known as:  NEURONTIN Take 800 mg by mouth 3  (three) times daily.   levETIRAcetam 750 MG tablet Commonly known as:  KEPPRA Take 750 mg by mouth 2 (two) times daily.   mirabegron ER 50 MG Tb24 tablet Commonly known as:  MYRBETRIQ Take 1 tablet (50 mg total) by mouth daily.   MOVANTIK 12.5 MG Tabs tablet Generic drug:  naloxegol oxalate Take 12.5 mg by mouth every morning.   polyethylene glycol packet Commonly known as:  MIRALAX / GLYCOLAX Take 17 g by mouth daily.   ranitidine 75 MG tablet Commonly known as:  ZANTAC Take 75  mg by mouth 2 (two) times daily.   rivaroxaban 20 MG Tabs tablet Commonly known as:  XARELTO Take 20 mg by mouth daily with supper.   sennosides-docusate sodium 8.6-50 MG tablet Commonly known as:  SENOKOT-S Take 2 tablets by mouth 2 (two) times daily.   tamsulosin 0.4 MG Caps capsule Commonly known as:  FLOMAX Take 0.4 mg by mouth daily.   vitamin C 500 MG tablet Commonly known as:  ASCORBIC ACID Take 500 mg by mouth 2 (two) times daily.   VOLTAREN 1 % Gel Generic drug:  diclofenac sodium Apply 2-4 g topically 3 (three) times daily as needed.       Review of Systems  Constitutional: Negative for activity change, appetite change, chills, diaphoresis, fatigue and fever.  HENT: Negative.   Respiratory: Negative.   Cardiovascular: Negative.   Gastrointestinal: Negative.   Genitourinary: Negative.   Musculoskeletal: Negative.   Skin: Positive for wound.  Neurological: Positive for weakness.  Psychiatric/Behavioral: Negative.     Immunization History  Administered Date(s) Administered  . Influenza Split 09/27/2012  . Influenza, High Dose Seasonal PF 10/04/2016  . Influenza-Unspecified 10/05/2013, 10/04/2014, 09/09/2015, 10/04/2016  . Pneumococcal Conjugate-13 07/29/2013  . Tdap 05/31/2008  . Zoster 01/18/2006  . Zoster Recombinat (Shingrix) 05/30/2016, 10/19/2016   Pertinent  Health Maintenance Due  Topic Date Due  . PNA vac Low Risk Adult (2 of 2 - PPSV23) 07/30/2014  .  INFLUENZA VACCINE  08/15/2017   No flowsheet data found. Functional Status Survey:    Vitals:   04/23/17 1043  BP: 133/71  Pulse: 74  Resp: 20  Temp: 98.2 F (36.8 C)  TempSrc: Oral  SpO2: 97%  Weight: 163 lb 14.4 oz (74.3 kg)  Height: 5\' 9"  (1.753 m)   Body mass index is 24.2 kg/m. Physical Exam  Constitutional: He is oriented to person, place, and time. Vital signs are normal. He appears well-developed and well-nourished. He is active and cooperative. He does not appear ill. No distress.  HENT:  Head: Normocephalic and atraumatic.  Mouth/Throat: Uvula is midline, oropharynx is clear and moist and mucous membranes are normal. Mucous membranes are not pale, not dry and not cyanotic.  Eyes: Pupils are equal, round, and reactive to light. Conjunctivae, EOM and lids are normal.  Neck: Trachea normal, normal range of motion and full passive range of motion without pain. Neck supple. No JVD present. No tracheal deviation, no edema and no erythema present. No thyromegaly present.  Cardiovascular: Normal rate, regular rhythm, normal heart sounds, intact distal pulses and normal pulses. Exam reveals no gallop, no distant heart sounds and no friction rub.  No murmur heard. Pulses:      Dorsalis pedis pulses are 2+ on the right side, and 2+ on the left side.  No edema  Pulmonary/Chest: Effort normal and breath sounds normal. No accessory muscle usage. No respiratory distress. He has no decreased breath sounds. He has no wheezes. He has no rhonchi. He has no rales. He exhibits no tenderness.  Abdominal: Soft. Normal appearance and bowel sounds are normal. He exhibits no distension and no ascites. There is no tenderness.  Musculoskeletal: Normal range of motion. He exhibits no edema or tenderness.       Feet:  Expected osteoarthritis, stiffness; Bilateral Calves soft, supple. Negative Homan's Sign. B- pedal pulses equal  Feet:  Left Foot:  Skin Integrity: Positive for blister, erythema  and warmth.  Neurological: He is alert and oriented to person, place, and time. He has normal  strength. He exhibits abnormal muscle tone. Coordination and gait abnormal.  Skin: Skin is warm, dry and intact. He is not diaphoretic. There is erythema. No cyanosis. No pallor. Nails show no clubbing.  Psychiatric: He has a normal mood and affect. His speech is normal and behavior is normal. Judgment and thought content normal. Cognition and memory are normal.  Nursing note and vitals reviewed.   Labs reviewed: Recent Labs    01/03/17 0000 01/03/17 0537 03/18/17 0505  NA 127* 134* 137  K 3.3* 3.8 4.1  CL 93* 92* 100*  CO2 31 32 29  GLUCOSE 151* 138* 101*  BUN 18 18 23*  CREATININE 0.76 0.69 0.67  CALCIUM 7.6* 8.4* 8.9  MG 1.8 1.9 2.2   Recent Labs    01/01/17 1239 03/18/17 0505  AST 38 23  ALT 35 19  ALKPHOS 110 75  BILITOT 0.5 0.8  PROT 6.9 6.5  ALBUMIN 3.1* 3.4*   Recent Labs    01/02/17 0535 01/04/17 0055 03/18/17 0505  WBC 11.7* 9.0 4.7  NEUTROABS  --   --  2.8  HGB 12.2* 12.1* 12.7*  HCT 35.1* 36.2* 38.2*  MCV 92.6 93.2 93.3  PLT 280 295 179   Lab Results  Component Value Date   TSH 1.844 03/18/2017   Lab Results  Component Value Date   HGBA1C 6.2 (H) 01/02/2017   Lab Results  Component Value Date   CHOL 97 01/03/2017   HDL 44 01/03/2017   LDLCALC 46 01/03/2017   TRIG 33 01/03/2017   CHOLHDL 2.2 01/03/2017    Significant Diagnostic Results in last 30 days:  No results found.  Assessment/Plan  Cellulitis of foot, left  Blister of left great toe, initial encounter   Cephalexin 500 mg po Q 6 hours x 10 days for infection  Mark skin with marker to tract regression/progression of cellulitis  Continue BLE TED Hose daily, on in the early am, off in the pm  Continue Eucerin cream daily and prn to the feet/ legs for skin integrity  Consider Silvadene cream if blister ruptures.   Allevyn dressing to the blister for protection. Change Q 5 days.      Family/ staff Communication:   Total Time:  Documentation:  Face to Face:  Family/Phone:   Labs/tests ordered:    Medication list reviewed and assessed for continued appropriateness.  Brynda RimShannon H. Bradon Fester, NP-C Geriatrics [facility] [phone] [place], [place] [address] Cell Phone (Mon-Fri 8am-5pm):  [phone] On Call:  [phone] & follow prompts after 5pm & weekends Office Phone:  [phone] Office Fax:  [phone]

## 2017-05-10 ENCOUNTER — Non-Acute Institutional Stay (SKILLED_NURSING_FACILITY): Payer: Medicare Other | Admitting: Gerontology

## 2017-05-10 ENCOUNTER — Encounter: Payer: Self-pay | Admitting: Gerontology

## 2017-05-10 DIAGNOSIS — E291 Testicular hypofunction: Secondary | ICD-10-CM | POA: Diagnosis not present

## 2017-05-10 DIAGNOSIS — K219 Gastro-esophageal reflux disease without esophagitis: Secondary | ICD-10-CM | POA: Diagnosis not present

## 2017-05-10 DIAGNOSIS — M419 Scoliosis, unspecified: Secondary | ICD-10-CM | POA: Diagnosis not present

## 2017-05-10 NOTE — Progress Notes (Signed)
Location:   [facility] Room Number: [phone] Place of Service:  SNF [phone]) Provider:  Lorenso Quarry, NP-C  Orene Desanctis, MD  Patient Care Team: Orene Desanctis, MD as PCP - General (Pediatrics) Verner Mould, MD (Family Medicine)  Extended Emergency Contact Information Primary Emergency Contact: Annie [facility] Address: [address]          [place], [place] [address] D[place] of [place] Home Phone: [phone] Mobile Phone: [phone] Relation: Daughter Secondary Emergency Contact: Forde Radon Address: [address]          [place], [place] [address] D[place] of [place] Home Phone: [phone] Relation: Spouse  Code Status:  DNR Goals of care: Advanced Directive information Advanced Directives 05/10/2017  Does Patient Have a Medical Advance Directive? Yes  Type of Advance Directive Out of facility DNR (pink MOST or yellow form)  Does patient want to make changes to medical advance directive? No - Patient declined  Copy of Healthcare Power of Attorney in Chart? -  Would patient like information on creating a medical advance directive? -     Chief Complaint  Patient presents with  . Medical Management of Chronic Issues    Routine Visit    HPI:  Pt is a 82 y.o. male seen today for medical management of chronic diseases.    Gastroesophageal reflux disease without esophagitis Stable. No c/o recent exacerbations. Symptoms managed with Zantac 75 mg po BID.  Hypogonadism in male Stable. No current treatment or follow up.  Scoliosis, unspecified scoliosis type, unspecified spinal region Stable. No recent c/o back pain. Any pain or discomfort controlled with Tylenol 975 mg po Q 8 hours prn.   Past Medical History:  Diagnosis Date  . Abdominal pain    other specified site  . Abnormal gait   . Advance directive in chart 10/12/2015  . Anemia   . Arthritis   . BPH (benign prostatic hyperplasia)   . Cataract   . Cervical spondylosis without  myelopathy   . Cognitive dysfunction   . DDD (degenerative disc disease), cervical 06/29/2013  . Degeneration of cervical intervertebral disc   . Degeneration of lumbar intervertebral disc   . Depression   . Excessive daytime sleepiness   . Generalized osteoarthritis of multiple sites 11/19/2013  . GERD (gastroesophageal reflux disease)   . Hallux varus, acquired   . Hiatal hernia   . Hyperlipidemia   . Hypertension   . Hypogonadism in male   . Impaired functional mobility, balance, gait, and endurance 11/14/2015   uses motorized wheelchair  . Insomnia 06/29/2013  . Low back pain   . Lumbosacral spondylosis without myelopathy   . Memory loss   . Mild cognitive impairment   . Neurogenic bladder   . OP (osteoporosis)   . Organic sleep apnea   . Osteoporosis 03/15/2004  . Peripheral neuropathy   . Radial nerve palsy   . Scoliosis 06/29/2013  . Seizure (HCC) 07/31/2016  . Sleep apnea    CPAP to bring DOS sleep study done in [place]  . Spinal compression fracture (HCC)   . Spinal stenosis   . Spondylolisthesis, acquired   . Urinary incontinence, stress, male 08/10/2015  . Urinary urgency   . Uses hearing aid 10/12/2015  . Venous stasis   . Venous stasis dermatitis of both lower extremities 06/29/2013  . Vision abnormalities   . Vitamin B12 deficiency 09/27/2016  . Wrist drop, bilateral    Past Surgical History:  Procedure Laterality Date  .  CARPAL TUNNEL RELEASE Left 03/16/2016   Dr. Kennedy Bucker  . CERVICAL SPINE SURGERY    . CORNEAL EYE SURGERY Right 09/08/2014   PTK (Dr. Chales Abrahams)  . HEMORRHOID SURGERY    . HERNIA REPAIR     left side  . INTRATHECAL PUMP IMPLANTATION  04/28/2014   Procedure: REVISION INTRATHECAL CATH; Surgeon: Burman Blacksmith, MD; Location: [facility] OR; Service: Anes/ Pain Mgmt; Laterality: N/A;  . JOINT REPLACEMENT Bilateral    bilateral knee replacements  . LUMBAR SPINE SURGERY    . PAIN PUMP IMPLANTATION     multiple procedures for  removal and insertion this is his 3rd pain pump  . PAIN PUMP REMOVAL     multiple procedures for removal and insertion this is his 3rd pain pump  . Proleive System Treatment  2006  . ROTATOR CUFF REPAIR Right    x2  . SPINAL CORD STIMULATOR IMPLANT     x2  . THORACIC SPINE SURGERY    . TONSILLECTOMY      Allergies  Allergen Reactions  . Amitriptyline Rash  . Nortriptyline Other (See Comments) and Rash    GI  upset    Allergies as of 05/10/2017      Reactions   Amitriptyline Rash   Nortriptyline Other (See Comments), Rash   GI  upset      Medication List        Accurate as of 05/10/17 12:03 PM. Always use your most recent med list.          acetaminophen 325 MG tablet Commonly known as:  TYLENOL Take 975 mg by mouth every 8 (eight) hours as needed.   alendronate 70 MG tablet Commonly known as:  FOSAMAX Take 70 mg by mouth once a week. On Thursday   alum & mag hydroxide-simeth 400-400-40 MG/5ML suspension Commonly known as:  MAALOX PLUS Take 30 mLs by mouth every 4 (four) hours as needed.   ammonium lactate 12 % lotion Commonly known as:  LAC-HYDRIN Apply 1 application topically 2 (two) times daily.   calcium carbonate 1500 (600 Ca) MG Tabs tablet Commonly known as:  OSCAL Take 600 mg of elemental calcium by mouth daily.   docusate sodium 100 MG capsule Commonly known as:  COLACE Take 100 mg by mouth daily.   DULoxetine 30 MG capsule Commonly known as:  CYMBALTA Take 30 mg by mouth at bedtime.   eucerin cream Wash feet with soap and water, dry. Apply Eucerin cream to dry, scaly skin daily, prn   gabapentin 800 MG tablet Commonly known as:  NEURONTIN Take 800 mg by mouth 3 (three) times daily.   levETIRAcetam 750 MG tablet Commonly known as:  KEPPRA Take 750 mg by mouth 2 (two) times daily.   mirabegron ER 50 MG Tb24 tablet Commonly known as:  MYRBETRIQ Take 1 tablet (50 mg total) by mouth daily.   MOVANTIK 12.5 MG Tabs tablet Generic drug:   naloxegol oxalate Take 12.5 mg by mouth every morning.   polyethylene glycol packet Commonly known as:  MIRALAX / GLYCOLAX Take 17 g by mouth daily.   ranitidine 75 MG tablet Commonly known as:  ZANTAC Take 75 mg by mouth 2 (two) times daily.   rivaroxaban 20 MG Tabs tablet Commonly known as:  XARELTO Take 20 mg by mouth daily with supper.   sennosides-docusate sodium 8.6-50 MG tablet Commonly known as:  SENOKOT-S Take 2 tablets by mouth 2 (two) times daily.   tamsulosin 0.4 MG Caps capsule Commonly known  as:  FLOMAX Take 0.4 mg by mouth daily.   vitamin C 500 MG tablet Commonly known as:  ASCORBIC ACID Take 500 mg by mouth 2 (two) times daily.   VOLTAREN 1 % Gel Generic drug:  diclofenac sodium Apply 2-4 g topically 3 (three) times daily as needed.       Review of Systems  Constitutional: Negative for activity change, appetite change, chills, diaphoresis and fever.  HENT: Negative for congestion, mouth sores, nosebleeds, postnasal drip, sneezing, sore throat, trouble swallowing and voice change.   Respiratory: Negative for apnea, cough, choking, chest tightness, shortness of breath and wheezing.   Cardiovascular: Negative for chest pain, palpitations and leg swelling.  Gastrointestinal: Negative for abdominal distention, abdominal pain, constipation, diarrhea and nausea.  Genitourinary: Negative for difficulty urinating, dysuria, frequency and urgency.  Musculoskeletal: Positive for arthralgias (typical arthritis) and gait problem. Negative for back pain and myalgias.  Skin: Negative for color change, pallor, rash and wound.  Neurological: Positive for weakness. Negative for dizziness, tremors, syncope, speech difficulty, numbness and headaches.  Psychiatric/Behavioral: Negative for agitation and behavioral problems.  All other systems reviewed and are negative.   Immunization History  Administered Date(s) Administered  . Influenza Split 09/27/2012  . Influenza,  High Dose Seasonal PF 10/04/2016  . Influenza-Unspecified 10/05/2013, 10/04/2014, 09/09/2015, 10/04/2016  . Pneumococcal Conjugate-13 07/29/2013  . Tdap 05/31/2008  . Zoster 01/18/2006  . Zoster Recombinat (Shingrix) 05/30/2016, 10/19/2016   Pertinent  Health Maintenance Due  Topic Date Due  . PNA vac Low Risk Adult (2 of 2 - PPSV23) 07/30/2014  . INFLUENZA VACCINE  08/15/2017   No flowsheet data found. Functional Status Survey:    Vitals:   05/10/17 1153  BP: 133/71  Pulse: 74  Resp: 20  Temp: 98.2 F (36.8 C)  TempSrc: Oral  SpO2: 97%  Weight: 176 lb 3.2 oz (79.9 kg)  Height: 5\' 9"  (1.753 m)   Body mass index is 26.02 kg/m. Physical Exam  Constitutional: Vital signs are normal. He appears well-developed and well-nourished. He is active and cooperative. He does not appear ill. No distress.  HENT:  Head: Normocephalic and atraumatic.  Mouth/Throat: Uvula is midline, oropharynx is clear and moist and mucous membranes are normal. Mucous membranes are not pale, not dry and not cyanotic.  Eyes: Pupils are equal, round, and reactive to light. Conjunctivae, EOM and lids are normal.  Neck: Trachea normal, normal range of motion and full passive range of motion without pain. Neck supple. No JVD present. No tracheal deviation, no edema and no erythema present. No thyromegaly present.  Cardiovascular: Normal rate, regular rhythm, normal heart sounds, intact distal pulses and normal pulses. Exam reveals no gallop, no distant heart sounds and no friction rub.  No murmur heard. Pulses:      Dorsalis pedis pulses are 2+ on the right side, and 2+ on the left side.  No edema  Pulmonary/Chest: Effort normal and breath sounds normal. No accessory muscle usage. No respiratory distress. He has no decreased breath sounds. He has no wheezes. He has no rhonchi. He has no rales. He exhibits no tenderness.  Abdominal: Soft. Normal appearance and bowel sounds are normal. He exhibits no distension  and no ascites. There is no tenderness.  Musculoskeletal: Normal range of motion. He exhibits no edema or tenderness.  Expected osteoarthritis, stiffness; Bilateral Calves soft, supple. Negative Homan's Sign. B- pedal pulses equal; mobile longer distances by self propelling in wheelchair  Neurological: He is alert. He has normal strength. He  exhibits abnormal muscle tone. Gait abnormal.  Skin: Skin is warm, dry and intact. He is not diaphoretic. No cyanosis. No pallor. Nails show no clubbing.  Psychiatric: He has a normal mood and affect. His speech is normal and behavior is normal. Judgment and thought content normal. Cognition and memory are normal.  Nursing note and vitals reviewed.   Labs reviewed: Recent Labs    01/03/17 0000 01/03/17 0537 03/18/17 0505  NA 127* 134* 137  K 3.3* 3.8 4.1  CL 93* 92* 100*  CO2 31 32 29  GLUCOSE 151* 138* 101*  BUN 18 18 23*  CREATININE 0.76 0.69 0.67  CALCIUM 7.6* 8.4* 8.9  MG 1.8 1.9 2.2   Recent Labs    01/01/17 1239 03/18/17 0505  AST 38 23  ALT 35 19  ALKPHOS 110 75  BILITOT 0.5 0.8  PROT 6.9 6.5  ALBUMIN 3.1* 3.4*   Recent Labs    01/02/17 0535 01/04/17 0055 03/18/17 0505  WBC 11.7* 9.0 4.7  NEUTROABS  --   --  2.8  HGB 12.2* 12.1* 12.7*  HCT 35.1* 36.2* 38.2*  MCV 92.6 93.2 93.3  PLT 280 295 179   Lab Results  Component Value Date   TSH 1.844 03/18/2017   Lab Results  Component Value Date   HGBA1C 6.2 (H) 01/02/2017   Lab Results  Component Value Date   CHOL 97 01/03/2017   HDL 44 01/03/2017   LDLCALC 46 01/03/2017   TRIG 33 01/03/2017   CHOLHDL 2.2 01/03/2017    Significant Diagnostic Results in last 30 days:  Ct Soft Tissue Neck W Contrast  Result Date: 04/23/2017 CLINICAL DATA:  82 year old male with progressive hoarseness and dysphagia for 2 months. Prior complicated left otomastoiditis, associated left dural venous thrombosis. EXAM: CT NECK WITH CONTRAST TECHNIQUE: Multidetector CT imaging of the neck  was performed using the standard protocol following the bolus administration of intravenous contrast. CONTRAST:  75mL OMNIPAQUE IOHEXOL 300 MG/ML  SOLN COMPARISON:  CT head and temporal bone 01/03/2017. CTA head and neck 01/03/2017 FINDINGS: Pharynx and larynx: There is mild medial deviation of the left vocal fold which is new since the 2018 CTA. There is associated new midline position and mild thickening of the left AE fold (series 3, image 50). On the 2018 study there was mild asymmetric prominence of the right laryngeal ventricle, while now there is asymmetric enlargement of the left (series 3, image 59). The right vocal fold and right AE fold appear stable since 2018. There is no laryngeal mass. The epiglottis and remainder of the hypopharynx appear stable and within normal limits. The other pharyngeal soft tissue contours are stable and within normal limits. Negative parapharyngeal and retropharyngeal spaces. Salivary glands: The sublingual and submandibular spaces and submandibular glands are within normal limits. The parotid glands are within normal limits. Thyroid: Negative. Lymph nodes: No cervical lymphadenopathy. Diminutive bilateral neck nodes appears stable since 2018. Vascular: The major vascular structures in the neck are patent. There appears to be chronic occlusion of the visible left sigmoid sinus and left IJ bulb. The left IJ is patent again at its confluence with the left retromandibular vein. The right IJ bulb and visible sigmoid sinus are patent. Limited intracranial: Chronic left sigmoid sinus occlusion as stated above. Otherwise negative. Visualized orbits: Negative. Mastoids and visualized paranasal sinuses: Persistent left mastoid opacification with chole since of air cells compared to 01/03/2017 (series 4, image 3). The left tympanic cavity now is clear. The left mastoid antrum also  is clear. Contralateral mild right posterior and inferior mastoid effusion appears unchanged. The right  tympanic cavity is clear. Paranasal sinuses and mastoids are stable and well pneumatized. Skeleton: Extensive postoperative changes to the cervical spine with chronic reversal of lordosis, chronic anterolisthesis at C2-C3 and C3-C4. ACDF hardware remains in place at C4-C5 where there does appear to be some interbody and left side posterior element arthrodesis. Solid arthrodesis at C5-C6 and C6-C7. Severe adjacent segment disease at C7-T1 and also T1-T2 in the form of severe disc space loss and endplate spurring with sclerosis. No acute osseous abnormality identified. Upper chest: No superior mediastinal mass or lymphadenopathy. Small aortic or pulmonary window lymph nodes are within normal limits, and the left AP window appears stable since 2018. Small pleural effusions have resolved. There is partially visible atelectasis or scarring in the superior segment of the left lower lobe. The visible Major airways are patent. IMPRESSION: 1. New medial deviation and thickened appearance of the left vocal fold and left AE fold compatible with left vocal cord paralysis. No causative lesion is identified in the neck or mediastinum, although note chronic occlusion of the left IJ bulb associated with #3. 2. Otherwise stable larynx and pharynx since the December 2018 CTA. No cervical lymphadenopathy. 3. Sequelae of complicated left mastoiditis with expected evolution and coalescence of the left mastoid air cells since December. 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 The left tympanic cavity now appears pneumatized. Electronically Signed   By: Odessa Fleming M.D.   On: 04/23/2017 12:27   Ct Chest W Contrast  Result Date: 04/23/2017 CLINICAL DATA:  Hoarseness, dysphagia, worsening over the past 2 months. EXAM: CT CHEST WITH CONTRAST TECHNIQUE: Multidetector CT imaging of the chest was performed during intravenous contrast administration. CONTRAST:  75mL OMNIPAQUE IOHEXOL 300 MG/ML  SOLN COMPARISON:  None. FINDINGS:  Cardiovascular: Scattered aortic calcifications. Heart is normal size. No evidence of aortic aneurysm. No filling defects in the pulmonary arteries to suggest pulmonary emboli. Mediastinum/Nodes: Thyroid is unremarkable. Trachea unremarkable. Large hiatal hernia containing much of the stomach and portion of the transverse colon. Scattered small mediastinal lymph nodes, none pathologically enlarged. No mediastinal, hilar, or axillary adenopathy. Lungs/Pleura: Compressive atelectasis in the left lower lobe adjacent to the large hiatal hernia. Right base atelectasis posteriorly. No effusions. Upper Abdomen: Imaging into the upper abdomen shows no acute findings. Small low-density lesions scattered throughout the liver, most likely small cysts. Musculoskeletal: Chest wall soft tissues are unremarkable. No acute bony abnormality. IMPRESSION: Large hiatal hernia containing much of the stomach and midportion of the transverse colon. Bibasilar atelectasis, left greater than right. No acute cardiopulmonary disease. Electronically Signed   By: Charlett Nose M.D.   On: 04/23/2017 11:55   Ct Venogram Head  Result Date: 04/23/2017 CLINICAL DATA:  Followup complicated mastoiditis on the left. Non pyogenic thrombosis of intracranial venous sinus. EXAM: CT VENOGRAM HEAD TECHNIQUE: CT venography is performed. CONTRAST:  75mL OMNIPAQUE IOHEXOL 300 MG/ML  SOLN COMPARISON:  01/04/2017 FINDINGS: Superior sagittal sinus is patent. Right transverse sinus and jugular vein are patent. Small amount of peripheral flow in the left transverse sinus, with persistent partial thrombosis of the left transverse sinus, sigmoid sinus and jugular vein. No sign of progressive brain pathology. No evidence of subdural empyema. Ventricular size is stable. 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 There may be a small amount of peripheral recanalized flow. Electronically Signed   By: Scherrie Bateman.D.  On: 04/23/2017 12:42    Assessment/Plan  Gastroesophageal reflux disease without esophagitis  Hypogonadism in male  Scoliosis, unspecified scoliosis type, unspecified spinal region   Above listed conditions stable  Continue current medication regimen  Continue to assist with ADLS, mobility, etc as needed  Safety precautions  Fall precautions  Seizure precautions  Monitor for worsening reflux  Monitor/ assess for pain  Family/ staff Communication:   Total Time:  Documentation:  Face to Face:  Family/Phone:   Labs/tests ordered:  Recent labs reviewed- stable. Not due  Medication list reviewed and assessed for continued appropriateness. Monthly medication orders reviewed and signed.  Brynda Rim, NP-C Geriatrics [facility] [phone] [address], [place] [address] Cell Phone (Mon-Fri 8am-5pm):  [phone] On Call:  [phone] & follow prompts after 5pm & weekends Office Phone:  [phone] Office Fax:  [phone]

## 2017-05-11 NOTE — Assessment & Plan Note (Signed)
Stable. No current treatment or follow up.

## 2017-05-11 NOTE — Assessment & Plan Note (Signed)
Stable. No c/o recent exacerbations. Symptoms managed with Zantac 75 mg po BID.

## 2017-05-11 NOTE — Assessment & Plan Note (Signed)
Stable. No recent c/o back pain. Any pain or discomfort controlled with Tylenol 975 mg po Q 8 hours prn.

## 2017-05-15 ENCOUNTER — Encounter
Admission: RE | Admit: 2017-05-15 | Discharge: 2017-05-15 | Disposition: A | Discharge status: Home / Self Care | Payer: Medicare Other | Source: Ambulatory Visit | Attending: Internal Medicine | Admitting: Internal Medicine

## 2017-05-28 ENCOUNTER — Encounter: Payer: Self-pay | Admitting: Gerontology

## 2017-05-28 ENCOUNTER — Non-Acute Institutional Stay (SKILLED_NURSING_FACILITY): Payer: Medicare Other | Admitting: Gerontology

## 2017-05-28 DIAGNOSIS — M810 Age-related osteoporosis without current pathological fracture: Secondary | ICD-10-CM

## 2017-05-28 DIAGNOSIS — M9979 Connective tissue and disc stenosis of intervertebral foramina of abdomen and other regions: Secondary | ICD-10-CM

## 2017-05-28 DIAGNOSIS — G4733 Obstructive sleep apnea (adult) (pediatric): Secondary | ICD-10-CM | POA: Diagnosis not present

## 2017-05-28 NOTE — Assessment & Plan Note (Signed)
Stable. No use of CPAP at night. O2 sats stable

## 2017-05-28 NOTE — Assessment & Plan Note (Signed)
Stable. No current fractures. Pt continues with PT/OT for now. Pt is on Oscal and Fosamax

## 2017-05-28 NOTE — Assessment & Plan Note (Signed)
Stable. Pt is mobile in wheelchair for longer distances, walker with shorter distances. On Neurontin 800 mg TID and Tylenol 975 mg po TID prn for pain.

## 2017-05-28 NOTE — Progress Notes (Signed)
Location:   [facility] Room Number: [phone] Place of Service:  SNF [id number]) Provider:  Lorenso Quarry, NP-C  Orene Desanctis, MD  Patient Care Team: Orene Desanctis, MD as PCP - General (Pediatrics) Verner Mould, MD (Family Medicine)  Extended Emergency Contact Information Primary Emergency Contact: [facility] Address: [address]          [place], [place] [address] Darden Amber of [place] Home Phone: [phone] Mobile Phone: [phone] Relation: Daughter Secondary Emergency Contact: Forde Radon Address: [address]          [place], [place] [address] Darden Amber of [place] Home Phone: [phone] Relation: Spouse  Code Status:  DNR Goals of care: Advanced Directive information Advanced Directives 05/28/2017  Does Patient Have a Medical Advance Directive? Yes  Type of Advance Directive Out of facility DNR (pink MOST or yellow form)  Does patient want to make changes to medical advance directive? No - Patient declined  Copy of Healthcare Power of Attorney in Chart? -  Would patient like information on creating a medical advance directive? -     Chief Complaint  Patient presents with  . Medical Management of Chronic Issues    Routine Visit    HPI:  Pt is a 82 y.o. male seen today for medical management of chronic diseases.    Obstructive apnea Stable. No use of CPAP at night. O2 sats stable  Osteoporosis without current pathological fracture, unspecified osteoporosis type Stable. No current fractures. Pt continues with PT/OT for now. Pt is on Oscal and Fosamax  Narrowing of intervertebral disc space Stable. Pt is mobile in wheelchair for longer distances, walker with shorter distances. On Neurontin 800 mg TID and Tylenol 975 mg po TID prn for pain.    Past Medical History:  Diagnosis Date  . Abdominal pain    other specified site  . Abnormal gait   . Advance directive in chart 10/12/2015  . Anemia   . Arthritis   . BPH (benign prostatic  hyperplasia)   . Cataract   . Cervical spondylosis without myelopathy   . Cognitive dysfunction   . DDD (degenerative disc disease), cervical 06/29/2013  . Degeneration of cervical intervertebral disc   . Degeneration of lumbar intervertebral disc   . Depression   . Excessive daytime sleepiness   . Generalized osteoarthritis of multiple sites 11/19/2013  . GERD (gastroesophageal reflux disease)   . Hallux varus, acquired   . Hiatal hernia   . Hyperlipidemia   . Hypertension   . Hypogonadism in male   . Impaired functional mobility, balance, gait, and endurance 11/14/2015   uses motorized wheelchair  . Insomnia 06/29/2013  . Low back pain   . Lumbosacral spondylosis without myelopathy   . Memory loss   . Mild cognitive impairment   . Neurogenic bladder   . OP (osteoporosis)   . Organic sleep apnea   . Osteoporosis 03/15/2004  . Peripheral neuropathy   . Radial nerve palsy   . Scoliosis 06/29/2013  . Seizure (HCC) 07/31/2016  . Sleep apnea    CPAP to bring DOS sleep study done in [place]  . Spinal compression fracture (HCC)   . Spinal stenosis   . Spondylolisthesis, acquired   . Urinary incontinence, stress, male 08/10/2015  . Urinary urgency   . Uses hearing aid 10/12/2015  . Venous stasis   . Venous stasis dermatitis of both lower extremities 06/29/2013  . Vision abnormalities   . Vitamin B12 deficiency 09/27/2016  . Wrist  drop, bilateral    Past Surgical History:  Procedure Laterality Date  . CARPAL TUNNEL RELEASE Left 03/16/2016   Dr. Kennedy Bucker  . CERVICAL SPINE SURGERY    . CORNEAL EYE SURGERY Right 09/08/2014   PTK (Dr. Chales Abrahams)  . HEMORRHOID SURGERY    . HERNIA REPAIR     left side  . INTRATHECAL PUMP IMPLANTATION  04/28/2014   Procedure: REVISION INTRATHECAL CATH; Surgeon: Burman Blacksmith, MD; Location: [facility] OR; Service: Anes/ Pain Mgmt; Laterality: N/A;  . JOINT REPLACEMENT Bilateral    bilateral knee replacements  . LUMBAR SPINE SURGERY     . PAIN PUMP IMPLANTATION     multiple procedures for removal and insertion this is his 3rd pain pump  . PAIN PUMP REMOVAL     multiple procedures for removal and insertion this is his 3rd pain pump  . Proleive System Treatment  2006  . ROTATOR CUFF REPAIR Right    x2  . SPINAL CORD STIMULATOR IMPLANT     x2  . THORACIC SPINE SURGERY    . TONSILLECTOMY      Allergies  Allergen Reactions  . Amitriptyline Rash  . Nortriptyline Other (See Comments) and Rash    GI  upset    Allergies as of 05/28/2017      Reactions   Amitriptyline Rash   Nortriptyline Other (See Comments), Rash   GI  upset      Medication List        Accurate as of 05/28/17  3:53 PM. Always use your most recent med list.          acetaminophen 325 MG tablet Commonly known as:  TYLENOL Take 975 mg by mouth every 8 (eight) hours as needed.   alendronate 70 MG tablet Commonly known as:  FOSAMAX Take 70 mg by mouth once a week. On Thursday   alum & mag hydroxide-simeth 400-400-40 MG/5ML suspension Commonly known as:  MAALOX PLUS Take 30 mLs by mouth every 4 (four) hours as needed.   ammonium lactate 12 % lotion Commonly known as:  LAC-HYDRIN Apply 1 application topically 2 (two) times daily.   ARTIFICIAL TEARS 1-0.3 % Soln Generic drug:  Propylene Glycol-Glycerin Place 1 drop into both eyes 2 (two) times daily as needed.   calcium carbonate 1500 (600 Ca) MG Tabs tablet Commonly known as:  OSCAL Take 600 mg of elemental calcium by mouth daily.   DERMACLOUD EX Apply liberal amount topically  to area of skin irritation prn. OK to leave at bedside.   docusate sodium 100 MG capsule Commonly known as:  COLACE Take 100 mg by mouth daily.   DULoxetine 30 MG capsule Commonly known as:  CYMBALTA Take 30 mg by mouth at bedtime.   eucerin cream Wash feet with soap and water, dry. Apply Eucerin cream to dry, scaly skin daily, prn   gabapentin 800 MG tablet Commonly known as:  NEURONTIN Take 800  mg by mouth 3 (three) times daily.   levETIRAcetam 750 MG tablet Commonly known as:  KEPPRA Take 750 mg by mouth 2 (two) times daily.   mirabegron ER 50 MG Tb24 tablet Commonly known as:  MYRBETRIQ Take 1 tablet (50 mg total) by mouth daily.   MOVANTIK 12.5 MG Tabs tablet Generic drug:  naloxegol oxalate Take 12.5 mg by mouth every morning.   polyethylene glycol packet Commonly known as:  MIRALAX / GLYCOLAX Take 17 g by mouth daily.   ranitidine 75 MG tablet Commonly known as:  ZANTAC  Take 75 mg by mouth 2 (two) times daily.   rivaroxaban 20 MG Tabs tablet Commonly known as:  XARELTO Take 20 mg by mouth daily with supper.   sennosides-docusate sodium 8.6-50 MG tablet Commonly known as:  SENOKOT-S Take 2 tablets by mouth 2 (two) times daily.   tamsulosin 0.4 MG Caps capsule Commonly known as:  FLOMAX Take 0.4 mg by mouth daily.   vitamin C 500 MG tablet Commonly known as:  ASCORBIC ACID Take 500 mg by mouth 2 (two) times daily.       Review of Systems  Constitutional: Negative for activity change, appetite change, chills, diaphoresis and fever.  HENT: Negative for congestion, mouth sores, nosebleeds, postnasal drip, sneezing, sore throat, trouble swallowing and voice change.   Respiratory: Negative for apnea, cough, choking, chest tightness, shortness of breath and wheezing.   Cardiovascular: Negative for chest pain, palpitations and leg swelling.  Gastrointestinal: Negative for abdominal distention, abdominal pain, constipation, diarrhea and nausea.  Genitourinary: Negative for difficulty urinating, dysuria, frequency and urgency.  Musculoskeletal: Positive for arthralgias (typical arthritis), back pain and gait problem. Negative for myalgias.  Skin: Negative for color change, pallor, rash and wound.  Neurological: Positive for weakness. Negative for dizziness, tremors, syncope, speech difficulty, numbness and headaches.  Psychiatric/Behavioral: Negative for  agitation and behavioral problems.  All other systems reviewed and are negative.   Immunization History  Administered Date(s) Administered  . Influenza Split 09/27/2012  . Influenza, High Dose Seasonal PF 10/04/2016  . Influenza-Unspecified 10/05/2013, 10/04/2014, 09/09/2015, 10/04/2016  . Pneumococcal Conjugate-13 07/29/2013  . Tdap 05/31/2008  . Zoster 01/18/2006  . Zoster Recombinat (Shingrix) 05/30/2016, 10/19/2016   Pertinent  Health Maintenance Due  Topic Date Due  . PNA vac Low Risk Adult (2 of 2 - PPSV23) 07/30/2014  . INFLUENZA VACCINE  08/15/2017   No flowsheet data found. Functional Status Survey:    Vitals:   05/28/17 1544  BP: 134/81  Pulse: 78  Resp: 18  Temp: 98.2 F (36.8 C)  TempSrc: Oral  SpO2: 98%  Weight: 179 lb (81.2 kg)  Height:  (1.753 m)   Body mass index is 26.43 kg/m. Physical Exam  Constitutional: He is oriented to person, place, and time. Vital signs are normal. He appears well-developed and well-nourished. He is active and cooperative. He does not appear ill. No distress.  HENT:  Head: Normocephalic and atraumatic.  Mouth/Throat: Uvula is midline, oropharynx is clear and moist and mucous membranes are normal. Mucous membranes are not pale, not dry and not cyanotic.  Eyes: Pupils are equal, round, and reactive to light. Conjunctivae, EOM and lids are normal.  Neck: Trachea normal, normal range of motion and full passive range of motion without pain. Neck supple. No JVD present. No tracheal deviation, no edema and no erythema present. No thyromegaly present.  Cardiovascular: Normal rate, normal heart sounds, intact distal pulses and normal pulses. An irregular rhythm present. Exam reveals no gallop, no distant heart sounds and no friction rub.  No murmur heard. Pulses:      Dorsalis pedis pulses are 2+ on the right side, and 2+ on the left side.  No edema  Pulmonary/Chest: Effort normal and breath sounds normal. No accessory muscle  usage. No respiratory distress. He has no decreased breath sounds. He has no wheezes. He has no rhonchi. He has no rales. He exhibits no tenderness.  Abdominal: Soft. Normal appearance and bowel sounds are normal. He exhibits no distension and no ascites. There is no tenderness.  Musculoskeletal: Normal range of motion. He exhibits no edema or tenderness.  Expected osteoarthritis, stiffness; Bilateral Calves soft, supple. Negative Homan's Sign. B- pedal pulses equal; generalized weakness, mobile in wheelchair  Neurological: He is alert and oriented to person, place, and time. He has normal strength. He exhibits abnormal muscle tone. Coordination and gait abnormal.  Skin: Skin is warm, dry and intact. He is not diaphoretic. No cyanosis. No pallor. Nails show no clubbing.  Psychiatric: He has a normal mood and affect. His speech is normal and behavior is normal. Judgment and thought content normal. Cognition and memory are normal.  Nursing note and vitals reviewed.   Labs reviewed: Recent Labs    01/03/17 0000 01/03/17 0537 03/18/17 0505  NA 127* 134* 137  K 3.3* 3.8 4.1  CL 93* 92* 100*  CO2 31 32 29  GLUCOSE 151* 138* 101*  BUN 18 18 23*  CREATININE 0.76 0.69 0.67  CALCIUM 7.6* 8.4* 8.9  MG 1.8 1.9 2.2   Recent Labs    01/01/17 1239 03/18/17 0505  AST 38 23  ALT 35 19  ALKPHOS 110 75  BILITOT 0.5 0.8  PROT 6.9 6.5  ALBUMIN 3.1* 3.4*   Recent Labs    01/02/17 0535 01/04/17 0055 03/18/17 0505  WBC 11.7* 9.0 4.7  NEUTROABS  --   --  2.8  HGB 12.2* 12.1* 12.7*  HCT 35.1* 36.2* 38.2*  MCV 92.6 93.2 93.3  PLT 280 295 179   Lab Results  Component Value Date   TSH 1.844 03/18/2017   Lab Results  Component Value Date   HGBA1C 6.2 (H) 01/02/2017   Lab Results  Component Value Date   CHOL 97 01/03/2017   HDL 44 01/03/2017   LDLCALC 46 01/03/2017   TRIG 33 01/03/2017   CHOLHDL 2.2 01/03/2017    Significant Diagnostic Results in last 30 days:  No results  found.  Assessment/Plan  Obstructive apnea  Osteoporosis without current pathological fracture, unspecified osteoporosis type  Narrowing of intervertebral disc space  Family/ staff Communication:   Total Time:  Documentation:  Face to Face:  Family/Phone:   Labs/tests ordered:  Recent labs reviewed- stable  Medication list reviewed and assessed for continued appropriateness. Monthly medication orders reviewed and signed.  Brynda Rim, NP-C Geriatrics [facility] [phone] [address], [place] [address] Cell Phone (Mon-Fri 8am-5pm):  [phone] On Call:  [phone] & follow prompts after 5pm & weekends Office Phone:  [phone] Office Fax:  [phone]

## 2017-06-15 ENCOUNTER — Encounter
Admission: RE | Admit: 2017-06-15 | Discharge: 2017-06-15 | Disposition: A | Discharge status: Home / Self Care | Payer: Medicare Other | Source: Ambulatory Visit | Attending: Internal Medicine | Admitting: Internal Medicine

## 2017-06-27 DIAGNOSIS — Z978 Presence of other specified devices: Secondary | ICD-10-CM | POA: Insufficient documentation

## 2017-07-03 ENCOUNTER — Encounter: Payer: Self-pay | Admitting: Gerontology

## 2017-07-03 NOTE — Progress Notes (Signed)
Patient not available   This encounter was created in error - please disregard.

## 2017-07-15 ENCOUNTER — Encounter
Admission: RE | Admit: 2017-07-15 | Discharge: 2017-07-15 | Disposition: A | Discharge status: Home / Self Care | Payer: Medicare Other | Source: Ambulatory Visit | Attending: Internal Medicine | Admitting: Internal Medicine

## 2017-07-16 ENCOUNTER — Non-Acute Institutional Stay (SKILLED_NURSING_FACILITY): Payer: Medicare Other | Admitting: Adult Health

## 2017-07-16 ENCOUNTER — Encounter: Payer: Self-pay | Admitting: Adult Health

## 2017-07-16 DIAGNOSIS — M779 Enthesopathy, unspecified: Secondary | ICD-10-CM

## 2017-07-16 NOTE — Progress Notes (Addendum)
Location:  [facility] Room Number: 325A Place of Service:  SNF ([phone]) Provider:  Kenard GowerMedina-Vargas, Monina, NP  Patient Care Team: Orene DesanctisBehling, Karen, MD as PCP - General (Pediatrics) Verner MouldFowler, Vickie A, MD (Family Medicine)  Extended Emergency Contact Information Primary Emergency Contact: [facility] Address: [address]          [place], [place] [address] [place] Home Phone: [phone] Mobile Phone: [phone] Relation: Daughter Secondary Emergency Contact: Forde RadonBurns,Sara Address: [address]          [place], [place] [address] [place] Home Phone: [phone] Relation: Spouse  Code Status:  DNR  Goals of care: Advanced Directive information Advanced Directives 07/16/2017  Does Patient Have a Medical Advance Directive? Yes  Type of Advance Directive Out of facility DNR (pink MOST or yellow form)  Does patient want to make changes to medical advance directive? No - Patient declined  Copy of Healthcare Power of Attorney in Chart? -  Would patient like information on creating a medical advance directive? -     Chief Complaint  Patient presents with  . Acute Visit    HPI:  Pt is an 82 y.o. male seen today for an acute visit for tendonitis of bilateral wrists and thumbs. He complained that whenever he moves them, they hurt  No erythema nor edema noted. He was noted to have bilateral wrist splints. He is requesting a new one since the current ones are already loose. He has a PMH of obstructive apnea, osteoporosis, and narrowing of intervertebral disc space.     Past Medical History:  Diagnosis Date  . Abdominal pain    other specified site  . Abnormal gait   . Advance directive in chart 10/12/2015  . Anemia   . Arthritis   . BPH (benign prostatic hyperplasia)   . Cataract   . Cervical spondylosis without myelopathy   . Cognitive dysfunction   . DDD (degenerative disc disease), cervical 06/29/2013  . Degeneration of cervical  intervertebral disc   . Degeneration of lumbar intervertebral disc   . Depression   . Excessive daytime sleepiness   . Generalized osteoarthritis of multiple sites 11/19/2013  . GERD (gastroesophageal reflux disease)   . Hallux varus, acquired   . Hiatal hernia   . Hyperlipidemia   . Hypertension   . Hypogonadism in male   . Impaired functional mobility, balance, gait, and endurance 11/14/2015   uses motorized wheelchair  . Insomnia 06/29/2013  . Low back pain   . Lumbosacral spondylosis without myelopathy   . Memory loss   . Mild cognitive impairment   . Neurogenic bladder   . OP (osteoporosis)   . Organic sleep apnea   . Osteoporosis 03/15/2004  . Peripheral neuropathy   . Radial nerve palsy   . Scoliosis 06/29/2013  . Seizure (HCC) 07/31/2016  . Sleep apnea    CPAP to bring DOS sleep study done in [place]  . Spinal compression fracture (HCC)   . Spinal stenosis   . Spondylolisthesis, acquired   . Urinary incontinence, stress, male 08/10/2015  . Urinary urgency   . Uses hearing aid 10/12/2015  . Venous stasis   . Venous stasis dermatitis of both lower extremities 06/29/2013  . Vision abnormalities   . Vitamin B12 deficiency 09/27/2016  . Wrist drop, bilateral    Past Surgical History:  Procedure Laterality Date  . CARPAL TUNNEL RELEASE Left 03/16/2016   Dr. Kennedy BuckerMichael Menz  . CERVICAL SPINE SURGERY    .  CORNEAL EYE SURGERY Right 09/08/2014   PTK (Dr. Chales Abrahams)  . HEMORRHOID SURGERY    . HERNIA REPAIR     left side  . INTRATHECAL PUMP IMPLANTATION  04/28/2014   Procedure: REVISION INTRATHECAL CATH; Surgeon: Burman Blacksmith, MD; Location: [facility] OR; Service: Anes/ Pain Mgmt; Laterality: N/A;  . JOINT REPLACEMENT Bilateral    bilateral knee replacements  . LUMBAR SPINE SURGERY    . PAIN PUMP IMPLANTATION     multiple procedures for removal and insertion this is his 3rd pain pump  . PAIN PUMP REMOVAL     multiple procedures for removal and insertion this is  his 3rd pain pump  . Proleive System Treatment  2006  . ROTATOR CUFF REPAIR Right    x2  . SPINAL CORD STIMULATOR IMPLANT     x2  . THORACIC SPINE SURGERY    . TONSILLECTOMY      Allergies  Allergen Reactions  . Amitriptyline Rash  . Nortriptyline Other (See Comments) and Rash    GI  upset    Outpatient Encounter Medications as of 07/16/2017  Medication Sig  . acetaminophen (TYLENOL) 325 MG tablet Take 975 mg by mouth every 8 (eight) hours as needed.  [name] Kitchen alendronate (FOSAMAX) 70 MG tablet Take 70 mg by mouth once a week. On Thursday  . alum & mag hydroxide-simeth (MAALOX PLUS) 400-400-40 MG/5ML suspension Take 30 mLs by mouth every 4 (four) hours as needed. Also take 15 mls by mouth 3 times daily with meals for Indigestion/ Reflux/ Gas  . ammonium lactate (LAC-HYDRIN) 12 % lotion Apply 1 application topically 2 (two) times daily.  . calcium carbonate (OSCAL) 1500 (600 Ca) MG TABS tablet Take 600 mg of elemental calcium by mouth daily.  [name] Kitchen docusate sodium (COLACE) 100 MG capsule Take 100 mg by mouth daily as needed.   . DULoxetine (CYMBALTA) 30 MG capsule Take 30 mg by mouth at bedtime.  . gabapentin (NEURONTIN) 800 MG tablet Take 800 mg by mouth 3 (three) times daily.  . Infant Care Products [facility] EX) Apply liberal amount topically  to area of skin irritation prn. OK to leave at bedside.  . levETIRAcetam (KEPPRA) 750 MG tablet Take 750 mg by mouth 2 (two) times daily.   . mirabegron ER (MYRBETRIQ) 50 MG TB24 tablet Take 1 tablet (50 mg total) by mouth daily.  . naloxegol oxalate (MOVANTIK) 12.5 MG TABS tablet Take 12.5 mg by mouth every morning.  [name] Kitchen omeprazole (PRILOSEC) 20 MG capsule Take 20 mg by mouth daily.  . polyethylene glycol (MIRALAX / GLYCOLAX) packet Take 17 g by mouth daily as needed.   [name] Kitchen Propylene Glycol-Glycerin (ARTIFICIAL TEARS) 1-0.3 % SOLN Place 1 drop into both eyes 2 (two) times daily as needed.  . ranitidine (ZANTAC) 150 MG tablet Take 150 mg by mouth 2 (two)  times daily.  . rivaroxaban (XARELTO) 20 MG TABS tablet Take 20 mg by mouth daily with supper.  . sennosides-docusate sodium (SENOKOT-S) 8.6-50 MG tablet Take 2 tablets by mouth 2 (two) times daily as needed for constipation.   . Skin Protectants, Misc. (EUCERIN) cream Wash feet with soap and water, dry. Apply Eucerin cream to dry, scaly skin daily, prn  . tamsulosin (FLOMAX) 0.4 MG CAPS capsule Take 0.4 mg by mouth daily.  . vitamin C (ASCORBIC ACID) 500 MG tablet Take 500 mg by mouth 2 (two) times daily.  . [DISCONTINUED] naloxone (NARCAN) nasal spray 4 mg/0.1 mL Place 1 spray into the nose as needed. If  needed may repeat spray in 2-3 minutes   No facility-administered encounter medications on file as of 07/16/2017.     Review of Systems  GENERAL: No change in appetite, no fatigue, no weight changes, no fever, chills or weakness MOUTH and THROAT: Denies oral discomfort, gingival pain or bleeding RESPIRATORY: no cough, SOB, DOE, wheezing, hemoptysis CARDIAC: No chest pain, edema or palpitations GI: No abdominal pain, diarrhea, constipation, heart burn, nausea or vomiting PSYCHIATRIC: Denies feelings of depression or anxiety. No report of hallucinations, insomnia, paranoia, or agitation   Immunization History  Administered Date(s) Administered  . Influenza Split 09/27/2012  . Influenza, High Dose Seasonal PF 10/04/2016  . Influenza-Unspecified 10/05/2013, 10/04/2014, 09/09/2015, 10/04/2016  . Pneumococcal Conjugate-13 07/29/2013  . Tdap 05/31/2008  . Zoster 01/18/2006  . Zoster Recombinat (Shingrix) 05/30/2016, 10/19/2016   Pertinent  Health Maintenance Due  Topic Date Due  . PNA vac Low Risk Adult (2 of 2 - PPSV23) 07/30/2014  . INFLUENZA VACCINE  08/15/2017    Vitals:   07/16/17 1404  BP: 126/67  Pulse: 78  Resp: 18  Temp: 98.7 F (37.1 C)  TempSrc: Oral  SpO2: 94%  Weight: 187 lb 4.8 oz (85 kg)  Height: 5\' 9"  (1.753 m)   Body mass index is 27.66 kg/m.  Physical  Exam  GENERAL APPEARANCE: Well nourished. In no acute distress. Normal body habitus EARS;  Bilateral hearing aids MOUTH and THROAT: Lips are without lesions. Oral mucosa is moist and without lesions.  RESPIRATORY: Breathing is even & unlabored, BS CTAB CARDIAC: RRR, no murmur,no extra heart sounds, no edema GI: Abdomen soft, normal BS, no masses, no tenderness EXTREMITIES:  No edema PSYCHIATRIC: Alert and oriented X 3. Affect and behavior are appropriate  Labs reviewed: Recent Labs    01/03/17 0000 01/03/17 0537 03/18/17 0505  NA 127* 134* 137  K 3.3* 3.8 4.1  CL 93* 92* 100*  CO2 31 32 29  GLUCOSE 151* 138* 101*  BUN 18 18 23*  CREATININE 0.76 0.69 0.67  CALCIUM 7.6* 8.4* 8.9  MG 1.8 1.9 2.2   Recent Labs    01/01/17 1239 03/18/17 0505  AST 38 23  ALT 35 19  ALKPHOS 110 75  BILITOT 0.5 0.8  PROT 6.9 6.5  ALBUMIN 3.1* 3.4*   Recent Labs    01/02/17 0535 01/04/17 0055 03/18/17 0505  WBC 11.7* 9.0 4.7  NEUTROABS  --   --  2.8  HGB 12.2* 12.1* 12.7*  HCT 35.1* 36.2* 38.2*  MCV 92.6 93.2 93.3  PLT 280 295 179   Lab Results  Component Value Date   TSH 1.844 03/18/2017   Lab Results  Component Value Date   HGBA1C 6.2 (H) 01/02/2017   Lab Results  Component Value Date   CHOL 97 01/03/2017   HDL 44 01/03/2017   LDLCALC 46 01/03/2017   TRIG 33 01/03/2017   CHOLHDL 2.2 01/03/2017    Assessment/Plan  1. Tendinitis - OT to evaluate for new splints, continue Acetaminophen 325 mg PRN, has abdominal pain pump and follows up @ [facility]    Family/ staff Communication: Discussed plan of care with patient.  Labs/tests ordered:  None  Goals of care:   Short-term care.   Kenard Gower, NP [facility] [phone] (Monday-Friday 8:00 a.m. - 5:00 p.m.) [phone] (after hours)

## 2017-08-06 ENCOUNTER — Non-Acute Institutional Stay (SKILLED_NURSING_FACILITY): Payer: Medicare Other | Admitting: Adult Health

## 2017-08-06 ENCOUNTER — Encounter: Payer: Self-pay | Admitting: Adult Health

## 2017-08-06 DIAGNOSIS — K5903 Drug induced constipation: Secondary | ICD-10-CM

## 2017-08-06 DIAGNOSIS — N3289 Other specified disorders of bladder: Secondary | ICD-10-CM | POA: Diagnosis not present

## 2017-08-06 DIAGNOSIS — R569 Unspecified convulsions: Secondary | ICD-10-CM | POA: Diagnosis not present

## 2017-08-06 DIAGNOSIS — T402X5A Adverse effect of other opioids, initial encounter: Secondary | ICD-10-CM

## 2017-08-06 DIAGNOSIS — N138 Other obstructive and reflux uropathy: Secondary | ICD-10-CM

## 2017-08-06 DIAGNOSIS — K219 Gastro-esophageal reflux disease without esophagitis: Secondary | ICD-10-CM

## 2017-08-06 DIAGNOSIS — N401 Enlarged prostate with lower urinary tract symptoms: Secondary | ICD-10-CM

## 2017-08-06 NOTE — Progress Notes (Signed)
Location:  [facility] Room Number: 326A Place of Service:  SNF ([phone]) Provider:  Kenard GowerMedina-Vargas, Monina, NP  Patient Care Team: Orene DesanctisBehling, Karen, MD as PCP - General (Pediatrics) Verner MouldFowler, Vickie A, MD (Family Medicine)  Extended Emergency Contact Information Primary Emergency Contact: [facility] Address: [address]          [place], [place] [address] [place] Home Phone: [phone] Mobile Phone: [phone] Relation: Daughter Secondary Emergency Contact: Forde RadonBurns,Sara Address: [address]          [place], [place] [address] [place] Home Phone: [phone] Relation: Spouse  Code Status:  DNR  Goals of care: Advanced Directive information Advanced Directives 08/06/2017  Does Patient Have a Medical Advance Directive? Yes  Type of Advance Directive Out of facility DNR (pink MOST or yellow form)  Does patient want to make changes to medical advance directive? No - Patient declined  Copy of Healthcare Power of Attorney in Chart? -  Would patient like information on creating a medical advance directive? -  Pre-existing out of facility DNR order (yellow form or pink MOST form) Yellow form placed in chart (order not valid for inpatient use)     Chief Complaint  Patient presents with  . Medical Management of Chronic Issues    Routine Visit    HPI:  Pt is an 82 y.o. male seen today for medical management of chronic diseases.  He is a long-term care resident of [facility].  He has a PMH of obstructive apnea, osteoporosis, and narrowing of the intervertebral disc space. He was seen in the room today. He was wearing bilateral wrist splints.  Past Medical History:  Diagnosis Date  . Abdominal pain    other specified site  . Abnormal gait   . Advance directive in chart 10/12/2015  . Anemia   . Arthritis   . BPH (benign prostatic hyperplasia)   . Cataract   . Cervical spondylosis without myelopathy   . Cognitive dysfunction     . DDD (degenerative disc disease), cervical 06/29/2013  . Degeneration of cervical intervertebral disc   . Degeneration of lumbar intervertebral disc   . Depression   . Excessive daytime sleepiness   . Generalized osteoarthritis of multiple sites 11/19/2013  . GERD (gastroesophageal reflux disease)   . Hallux varus, acquired   . Hiatal hernia   . Hyperlipidemia   . Hypertension   . Hypogonadism in male   . Impaired functional mobility, balance, gait, and endurance 11/14/2015   uses motorized wheelchair  . Insomnia 06/29/2013  . Low back pain   . Lumbosacral spondylosis without myelopathy   . Memory loss   . Mild cognitive impairment   . Neurogenic bladder   . OP (osteoporosis)   . Organic sleep apnea   . Osteoporosis 03/15/2004  . Peripheral neuropathy   . Radial nerve palsy   . Scoliosis 06/29/2013  . Seizure (HCC) 07/31/2016  . Sleep apnea    CPAP to bring DOS sleep study done in [place]  . Spinal compression fracture (HCC)   . Spinal stenosis   . Spondylolisthesis, acquired   . Urinary incontinence, stress, male 08/10/2015  . Urinary urgency   . Uses hearing aid 10/12/2015  . Venous stasis   . Venous stasis dermatitis of both lower extremities 06/29/2013  . Vision abnormalities   . Vitamin B12 deficiency 09/27/2016  . Wrist drop, bilateral    Past Surgical History:  Procedure Laterality Date  . CARPAL  TUNNEL RELEASE Left 03/16/2016   Dr. Kennedy Bucker  . CERVICAL SPINE SURGERY    . CORNEAL EYE SURGERY Right 09/08/2014   PTK (Dr. Chales Abrahams)  . HEMORRHOID SURGERY    . HERNIA REPAIR     left side  . INTRATHECAL PUMP IMPLANTATION  04/28/2014   Procedure: REVISION INTRATHECAL CATH; Surgeon: Burman Blacksmith, MD; Location: [facility] OR; Service: Anes/ Pain Mgmt; Laterality: N/A;  . JOINT REPLACEMENT Bilateral    bilateral knee replacements  . LUMBAR SPINE SURGERY    . PAIN PUMP IMPLANTATION     multiple procedures for removal and insertion this is his 3rd pain  pump  . PAIN PUMP REMOVAL     multiple procedures for removal and insertion this is his 3rd pain pump  . Proleive System Treatment  2006  . ROTATOR CUFF REPAIR Right    x2  . SPINAL CORD STIMULATOR IMPLANT     x2  . THORACIC SPINE SURGERY    . TONSILLECTOMY      Allergies  Allergen Reactions  . Amitriptyline Rash  . Nortriptyline Other (See Comments) and Rash    GI  upset    Outpatient Encounter Medications as of 08/06/2017  Medication Sig  . acetaminophen (TYLENOL) 325 MG tablet Take 975 mg by mouth every 8 (eight) hours as needed.  [name] Kitchen alendronate (FOSAMAX) 70 MG tablet Take 70 mg by mouth once a week. On Thursday  . alum & mag hydroxide-simeth (MAALOX PLUS) 400-400-40 MG/5ML suspension Take 30 mLs by mouth every 4 (four) hours as needed. Also take 15 mls by mouth 3 times daily with meals for Indigestion/ Reflux/ Gas  . ammonium lactate (LAC-HYDRIN) 12 % lotion Apply 1 application topically 2 (two) times daily.  . calcium carbonate (OSCAL) 1500 (600 Ca) MG TABS tablet Take 600 mg of elemental calcium by mouth daily.  [name] Kitchen docusate sodium (COLACE) 100 MG capsule Take 100 mg by mouth daily as needed.   . DULoxetine (CYMBALTA) 30 MG capsule Take 30 mg by mouth at bedtime.  . gabapentin (NEURONTIN) 800 MG tablet Take 800 mg by mouth 3 (three) times daily.  . Infant Care Products [facility] EX) Apply liberal amount topically  to area of skin irritation prn. OK to leave at bedside.  . levETIRAcetam (KEPPRA) 750 MG tablet Take 750 mg by mouth 2 (two) times daily.   . mirabegron ER (MYRBETRIQ) 50 MG TB24 tablet Take 1 tablet (50 mg total) by mouth daily.  . naloxegol oxalate (MOVANTIK) 12.5 MG TABS tablet Take 12.5 mg by mouth every morning.  [name] Kitchen omeprazole (PRILOSEC) 20 MG capsule Take 20 mg by mouth daily.  . polyethylene glycol (MIRALAX / GLYCOLAX) packet Take 17 g by mouth daily as needed.   [name] Kitchen Propylene Glycol-Glycerin (ARTIFICIAL TEARS) 1-0.3 % SOLN Place 1 drop into both eyes 2 (two)  times daily as needed.  . ranitidine (ZANTAC) 150 MG tablet Take 150 mg by mouth 2 (two) times daily.  . rivaroxaban (XARELTO) 20 MG TABS tablet Take 20 mg by mouth daily with supper.  . sennosides-docusate sodium (SENOKOT-S) 8.6-50 MG tablet Take 2 tablets by mouth 2 (two) times daily as needed for constipation.   . Skin Protectants, Misc. (EUCERIN) cream Wash feet with soap and water, dry. Apply Eucerin cream to dry, scaly skin daily, prn  . tamsulosin (FLOMAX) 0.4 MG CAPS capsule Take 0.4 mg by mouth daily.  [name] Kitchen UNABLE TO FIND Regular diet. Aspiration precautions  . vitamin C (ASCORBIC ACID) 500 MG tablet Take  500 mg by mouth 2 (two) times daily.   No facility-administered encounter medications on file as of 08/06/2017.     Review of Systems  GENERAL: No change in appetite, no fatigue, no weight changes, no fever, chills or weakness MOUTH and THROAT: Denies oral discomfort, gingival pain or bleeding RESPIRATORY: no cough, SOB, DOE, wheezing, hemoptysis CARDIAC: No chest pain, or palpitations GI: No abdominal pain, diarrhea, constipation, heart burn, nausea or vomiting GU: Denies dysuria, frequency, hematuria, incontinence, or discharge PSYCHIATRIC: Denies feelings of depression or anxiety. No report of hallucinations, insomnia, paranoia, or agitation    Immunization History  Administered Date(s) Administered  . Influenza Split 09/27/2012  . Influenza, High Dose Seasonal PF 10/04/2016  . Influenza-Unspecified 10/05/2013, 10/04/2014, 09/09/2015, 10/04/2016  . PPD Test 03/29/2017  . Pneumococcal Conjugate-13 07/29/2013  . Tdap 05/31/2008  . Zoster 01/18/2006  . Zoster Recombinat (Shingrix) 05/30/2016, 10/19/2016   Pertinent  Health Maintenance Due  Topic Date Due  . PNA vac Low Risk Adult (2 of 2 - PPSV23) 07/30/2014  . INFLUENZA VACCINE  08/15/2017     Vitals:   08/06/17 1106  BP: 110/64  Pulse: 66  Resp: 18  Temp: 98 F (36.7 C)  TempSrc: Oral  SpO2: 98%  Weight: 187  lb 4.8 oz (85 kg)  Height: 5\' 9"  (1.753 m)   Body mass index is 27.66 kg/m.  Physical Exam  GENERAL APPEARANCE: Well nourished. In no acute distress. Normal body habitus SKIN:  Skin is warm and dry.  MOUTH and THROAT: Lips are without lesions. Oral mucosa is moist and without lesions. Tongue is normal in shape, size, and color and without lesions RESPIRATORY: Breathing is even & unlabored, BS CTAB CARDIAC: RRR, no murmur,no extra heart sounds, BLE trace edema GI: has pain pump on RUQ EXTREMITIES:  Able to move X 4 extremities, wears bilateral wrist splints NEUROLOGICAL: There is no tremor. Speech is clear PSYCHIATRIC: Alert and oriented X 3. Affect and behavior are appropriate   Labs reviewed: Recent Labs    01/03/17 0000 01/03/17 0537 03/18/17 0505  NA 127* 134* 137  K 3.3* 3.8 4.1  CL 93* 92* 100*  CO2 31 32 29  GLUCOSE 151* 138* 101*  BUN 18 18 23*  CREATININE 0.76 0.69 0.67  CALCIUM 7.6* 8.4* 8.9  MG 1.8 1.9 2.2   Recent Labs    01/01/17 1239 03/18/17 0505  AST 38 23  ALT 35 19  ALKPHOS 110 75  BILITOT 0.5 0.8  PROT 6.9 6.5  ALBUMIN 3.1* 3.4*   Recent Labs    01/02/17 0535 01/04/17 0055 03/18/17 0505  WBC 11.7* 9.0 4.7  NEUTROABS  --   --  2.8  HGB 12.2* 12.1* 12.7*  HCT 35.1* 36.2* 38.2*  MCV 92.6 93.2 93.3  PLT 280 295 179   Lab Results  Component Value Date   TSH 1.844 03/18/2017   Lab Results  Component Value Date   HGBA1C 6.2 (H) 01/02/2017   Lab Results  Component Value Date   CHOL 97 01/03/2017   HDL 44 01/03/2017   LDLCALC 46 01/03/2017   TRIG 33 01/03/2017   CHOLHDL 2.2 01/03/2017    Assessment/Plan  1. Seizure (HCC) - no recent seizures, continue Keppra 750 mg   2. Therapeutic opioid induced constipation - denies constipation, continue Movantik 12.5 mg Q AM, Miralax 17 gm  Q D PRN   3. Bladder spasm - stable, continue Myrbetriq  ER 24 HR   4. BPH with obstruction/lower urinary  tract symptoms - continue Tamsulosin 0.4  mg 1 capsule daily   5. Gastroesophageal reflux disease, esophagitis presence not specified - stable, continue Omeprazole DR 20 mg 1 capsule daily, and Ranitidine 150 mg BID    Family/ staff Communication: Discussed plan of care with resident.  Labs/tests ordered:  BMP and CBC  Goals of care:   Long-term care.   Kenard Gower, NP [facility] and Adult Medicine [phone] (Monday-Friday 8:00 a.m. - 5:00 p.m.) [phone] (after hours)

## 2017-08-09 ENCOUNTER — Other Ambulatory Visit: Payer: Self-pay | Admitting: Neurology

## 2017-08-09 DIAGNOSIS — G062 Extradural and subdural abscess, unspecified: Secondary | ICD-10-CM

## 2017-08-09 DIAGNOSIS — G08 Intracranial and intraspinal phlebitis and thrombophlebitis: Secondary | ICD-10-CM

## 2017-08-15 ENCOUNTER — Encounter
Admission: RE | Admit: 2017-08-15 | Discharge: 2017-08-15 | Disposition: A | Discharge status: Home / Self Care | Payer: Medicare Other | Source: Ambulatory Visit | Attending: Internal Medicine | Admitting: Internal Medicine

## 2017-08-20 ENCOUNTER — Ambulatory Visit
Admission: RE | Admit: 2017-08-20 | Discharge: 2017-08-20 | Disposition: A | Discharge status: Home / Self Care | Payer: Medicare Other | Source: Ambulatory Visit | Attending: Neurology | Admitting: Neurology

## 2017-08-20 DIAGNOSIS — I82C19 Acute embolism and thrombosis of unspecified internal jugular vein: Secondary | ICD-10-CM | POA: Diagnosis not present

## 2017-08-20 DIAGNOSIS — G062 Extradural and subdural abscess, unspecified: Secondary | ICD-10-CM | POA: Insufficient documentation

## 2017-08-20 DIAGNOSIS — J3489 Other specified disorders of nose and nasal sinuses: Secondary | ICD-10-CM | POA: Insufficient documentation

## 2017-08-20 DIAGNOSIS — G08 Intracranial and intraspinal phlebitis and thrombophlebitis: Secondary | ICD-10-CM | POA: Diagnosis not present

## 2017-08-20 LAB — POCT I-STAT CREATININE: CREATININE: 1 mg/dL (ref 0.61–1.24)

## 2017-08-20 MED ORDER — IOHEXOL 300 MG/ML  SOLN
75.0000 mL | Freq: Once | INTRAMUSCULAR | Status: AC | PRN
  Administered 2017-08-20: 75 mL via INTRAVENOUS

## 2017-09-12 ENCOUNTER — Non-Acute Institutional Stay (SKILLED_NURSING_FACILITY): Payer: Medicare Other | Admitting: Adult Health

## 2017-09-12 ENCOUNTER — Encounter: Payer: Self-pay | Admitting: Adult Health

## 2017-09-12 DIAGNOSIS — G894 Chronic pain syndrome: Secondary | ICD-10-CM

## 2017-09-12 DIAGNOSIS — G08 Intracranial and intraspinal phlebitis and thrombophlebitis: Secondary | ICD-10-CM | POA: Diagnosis not present

## 2017-09-12 DIAGNOSIS — R569 Unspecified convulsions: Secondary | ICD-10-CM

## 2017-09-12 DIAGNOSIS — K219 Gastro-esophageal reflux disease without esophagitis: Secondary | ICD-10-CM

## 2017-09-12 DIAGNOSIS — N401 Enlarged prostate with lower urinary tract symptoms: Secondary | ICD-10-CM | POA: Diagnosis not present

## 2017-09-12 DIAGNOSIS — R3915 Urgency of urination: Secondary | ICD-10-CM

## 2017-09-12 DIAGNOSIS — T402X5A Adverse effect of other opioids, initial encounter: Secondary | ICD-10-CM

## 2017-09-12 DIAGNOSIS — K5903 Drug induced constipation: Secondary | ICD-10-CM

## 2017-09-12 DIAGNOSIS — N138 Other obstructive and reflux uropathy: Secondary | ICD-10-CM

## 2017-09-12 NOTE — Progress Notes (Signed)
Location:  [facility] Room Number:   Place of Service:  SNF ([phone]) Provider:  Kenard Gower, NP  Patient Care Team: Orene Desanctis, MD as PCP - General (Pediatrics) Verner Mould, MD (Family Medicine)  Extended Emergency Contact Information Primary Emergency Contact: [facility] Address: [address]          [place], [place] [address] [place] Home Phone: [phone] Mobile Phone: [phone] Relation: Daughter Secondary Emergency Contact: Forde Radon Address: [address]          [place], [place] [address] [place] Home Phone: [phone] Relation: Spouse  Code Status:  DNR   Goals of care: Advanced Directive information Advanced Directives 09/12/2017  Does Patient Have a Medical Advance Directive? Yes  Type of Advance Directive Out of facility DNR (pink MOST or yellow form)  Does patient want to make changes to medical advance directive? No - Patient declined  Copy of Healthcare Power of Attorney in Chart? -  Would patient like information on creating a medical advance directive? -  Pre-existing out of facility DNR order (yellow form or pink MOST form) Yellow form placed in chart (order not valid for inpatient use)     Chief Complaint  Patient presents with  . Medical Management of Chronic Issues    Routine Visit    HPI:  Pt is a 82 y.o. male seen today for medical management of chronic diseases. 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 He was seen in the room today. He was noted not wearing his wrist splints since his wrists "feel better".   Past Medical History:  Diagnosis Date  . Abdominal pain    other specified site  . Abnormal gait   . Advance directive in chart 10/12/2015  . Anemia   . Arthritis   . BPH (benign prostatic hyperplasia)   . Cataract   . Cervical spondylosis without myelopathy   . Cognitive dysfunction   .  DDD (degenerative disc disease), cervical 06/29/2013  . Degeneration of cervical intervertebral disc   . Degeneration of lumbar intervertebral disc   . Depression   . Excessive daytime sleepiness   . Generalized osteoarthritis of multiple sites 11/19/2013  . GERD (gastroesophageal reflux disease)   . Hallux varus, acquired   . Hiatal hernia   . Hyperlipidemia   . Hypertension   . Hypogonadism in male   . Impaired functional mobility, balance, gait, and endurance 11/14/2015   uses motorized wheelchair  . Insomnia 06/29/2013  . Low back pain   . Lumbosacral spondylosis without myelopathy   . Memory loss   . Mild cognitive impairment   . Neurogenic bladder   . OP (osteoporosis)   . Organic sleep apnea   . Osteoporosis 03/15/2004  . Peripheral neuropathy   . Radial nerve palsy   . Scoliosis 06/29/2013  . Seizure (HCC) 07/31/2016  . Sleep apnea    CPAP to bring DOS sleep study done in [place]  . Spinal compression fracture (HCC)   . Spinal stenosis   . Spondylolisthesis, acquired   . Urinary incontinence, stress, male 08/10/2015  . Urinary urgency   . Uses hearing aid 10/12/2015  . Venous stasis   . Venous stasis dermatitis of both lower extremities 06/29/2013  . Vision abnormalities   . Vitamin B12 deficiency 09/27/2016  . Wrist drop, bilateral    Past Surgical History:  Procedure Laterality Date  . CARPAL TUNNEL  RELEASE Left 03/16/2016   Dr. Kennedy Bucker  . CERVICAL SPINE SURGERY    . CORNEAL EYE SURGERY Right 09/08/2014   PTK (Dr. Chales Abrahams)  . HEMORRHOID SURGERY    . HERNIA REPAIR     left side  . INTRATHECAL PUMP IMPLANTATION  04/28/2014   Procedure: REVISION INTRATHECAL CATH; Surgeon: Burman Blacksmith, MD; Location: [facility] OR; Service: Anes/ Pain Mgmt; Laterality: N/A;  . JOINT REPLACEMENT Bilateral    bilateral knee replacements  . LUMBAR SPINE SURGERY    . PAIN PUMP IMPLANTATION     multiple procedures for removal and insertion this is his 3rd pain  pump  . PAIN PUMP REMOVAL     multiple procedures for removal and insertion this is his 3rd pain pump  . Proleive System Treatment  2006  . ROTATOR CUFF REPAIR Right    x2  . SPINAL CORD STIMULATOR IMPLANT     x2  . THORACIC SPINE SURGERY    . TONSILLECTOMY      Allergies  Allergen Reactions  . Amitriptyline Rash  . Nortriptyline Other (See Comments) and Rash    GI  upset    Outpatient Encounter Medications as of 09/12/2017  Medication Sig  . acetaminophen (TYLENOL) 325 MG tablet Take 975 mg by mouth every 8 (eight) hours as needed.  [name] Kitchen alendronate (FOSAMAX) 70 MG tablet Take 70 mg by mouth once a week. On Thursday  . alum & mag hydroxide-simeth (MAALOX PLUS) 400-400-40 MG/5ML suspension Take 15 mLs by mouth 3 (three) times daily with meals. Also take 15 mls by mouth 3 times daily with meals for Indigestion/ Reflux/ Gas  . ammonium lactate (LAC-HYDRIN) 12 % lotion Apply 1 application topically 2 (two) times daily.   . calcium carbonate (OSCAL) 1500 (600 Ca) MG TABS tablet Take 600 mg of elemental calcium by mouth daily.  [name] Kitchen docusate sodium (COLACE) 100 MG capsule Take 100 mg by mouth daily as needed.   . DULoxetine (CYMBALTA) 30 MG capsule Take 30 mg by mouth at bedtime.  . gabapentin (NEURONTIN) 800 MG tablet Take 800 mg by mouth 3 (three) times daily.  . Infant Care Products [facility] EX) Apply liberal amount topically  to area of skin irritation prn. OK to leave at bedside.  . levETIRAcetam (KEPPRA) 750 MG tablet Take 750 mg by mouth at bedtime. Monitor for sedation  . levETIRAcetam (KEPPRA) 750 MG tablet Take 750 mg by mouth daily. Monitor for sedation  . Menthol, Topical Analgesic, (BIOFREEZE) 4 % GEL Apply topically topically to  bilateral shoulders twice daily for joint pain  . mirabegron ER (MYRBETRIQ) 50 MG TB24 tablet Take 1 tablet (50 mg total) by mouth daily.  . naloxegol oxalate (MOVANTIK) 12.5 MG TABS tablet Take 12.5 mg by mouth every morning.  [name] Kitchen omeprazole (PRILOSEC)  20 MG capsule Take 20 mg by mouth daily.  . Ostomy Supplies (SECURI-T NO STING WIPE) MISC Apply topically to  left foot 2nd and 3rd toes apply skin prep on dressing to affected areas twice daily. Allow to dry before donning socks  . polyethylene glycol (MIRALAX / GLYCOLAX) packet Take 17 g by mouth daily as needed.   [name] Kitchen Propylene Glycol-Glycerin (ARTIFICIAL TEARS) 1-0.3 % SOLN Place 1 drop into both eyes 2 (two) times daily as needed.  . ranitidine (ZANTAC) 150 MG tablet Take 150 mg by mouth 2 (two) times daily.  . rivaroxaban (XARELTO) 20 MG TABS tablet Take 20 mg by mouth daily with supper.  . sennosides-docusate sodium (SENOKOT-S) 8.6-50  MG tablet Take 2 tablets by mouth 2 (two) times daily as needed for constipation.   . Skin Protectants, Misc. (EUCERIN) cream Wash feet with soap and water, dry. Apply Eucerin cream to dry, scaly skin daily, prn  . tamsulosin (FLOMAX) 0.4 MG CAPS capsule Take 0.4 mg by mouth daily.  [name] Kitchen. UNABLE TO FIND Regular diet. Aspiration precautions  . vitamin C (ASCORBIC ACID) 500 MG tablet Take 500 mg by mouth 2 (two) times daily.   No facility-administered encounter medications on file as of 09/12/2017.     Review of Systems  GENERAL: No change in appetite, no fatigue, no weight changes, no fever, chills or weakness MOUTH and THROAT: Denies oral discomfort, gingival pain or bleeding RESPIRATORY: no cough, SOB, DOE, wheezing, hemoptysis CARDIAC: No chest pain, edema or palpitations GI: No abdominal pain, diarrhea, constipation, heart burn, nausea or vomiting GU: Denies dysuria, frequency, hematuria, incontinence, or discharge PSYCHIATRIC: Denies feelings of depression or anxiety. No report of hallucinations, insomnia, paranoia, or agitation   Immunization History  Administered Date(s) Administered  . Influenza Split 09/27/2012  . Influenza, High Dose Seasonal PF 10/04/2016  . Influenza-Unspecified 10/05/2013, 10/04/2014, 09/09/2015, 10/04/2016  . PPD Test  03/29/2017  . Pneumococcal Conjugate-13 07/29/2013  . Tdap 05/31/2008  . Zoster 01/18/2006  . Zoster Recombinat (Shingrix) 05/30/2016, 10/19/2016   Pertinent  Health Maintenance Due  Topic Date Due  . PNA vac Low Risk Adult (2 of 2 - PPSV23) 07/30/2014  . INFLUENZA VACCINE  08/15/2017      Vitals:   09/12/17 1037  BP: 124/82  Pulse: 78  Resp: 20  Temp: (!) 97.4 F (36.3 C)  TempSrc: Oral  SpO2: 98%  Weight: 181 lb (82.1 kg)  Height: 5\' 9"  (1.753 m)   Body mass index is 26.73 kg/m.  Physical Exam  GENERAL APPEARANCE: Well nourished. In no acute distress. Normal body habitus SKIN:  Skin is warm and dry.  MOUTH and THROAT: Lips are without lesions. Oral mucosa is moist and without lesions.  RESPIRATORY: Breathing is even & unlabored, BS CTAB CARDIAC: RRR, no murmur,no extra heart sounds, no edema GI: Abdomen soft, normal BS, no masse, RLQ with pain pump EXTREMITIES:  Able to move X 4 extremities PSYCHIATRIC: Alert and oriented X 3. Affect and behavior are appropriate   Labs reviewed: Recent Labs    01/03/17 0000 01/03/17 0537 03/18/17 0505 08/20/17 1414  NA 127* 134* 137  --   K 3.3* 3.8 4.1  --   CL 93* 92* 100*  --   CO2 31 32 29  --   GLUCOSE 151* 138* 101*  --   BUN 18 18 23*  --   CREATININE 0.76 0.69 0.67 1.00  CALCIUM 7.6* 8.4* 8.9  --   MG 1.8 1.9 2.2  --    Recent Labs    01/01/17 1239 03/18/17 0505  AST 38 23  ALT 35 19  ALKPHOS 110 75  BILITOT 0.5 0.8  PROT 6.9 6.5  ALBUMIN 3.1* 3.4*   Recent Labs    01/02/17 0535 01/04/17 0055 03/18/17 0505  WBC 11.7* 9.0 4.7  NEUTROABS  --   --  2.8  HGB 12.2* 12.1* 12.7*  HCT 35.1* 36.2* 38.2*  MCV 92.6 93.2 93.3  PLT 280 295 179   Lab Results  Component Value Date   TSH 1.844 03/18/2017   Lab Results  Component Value Date   HGBA1C 6.2 (H) 01/02/2017   Lab Results  Component Value Date   CHOL  97 01/03/2017   HDL 44 01/03/2017   LDLCALC 46 01/03/2017   TRIG 33 01/03/2017   CHOLHDL  2.2 01/03/2017    Significant Diagnostic Results in last 30 days:  Ct Venogram Head  Result Date: 08/20/2017 CLINICAL DATA:  Dural venous sinus thrombosis.  Subdural empyema. EXAM: CT VENOGRAM HEAD TECHNIQUE: Pre and post contrast scanning was performed for evaluation of the venous sinuses and other intracranial structures. CONTRAST:  75mL OMNIPAQUE IOHEXOL 300 MG/ML  SOLN COMPARISON:  04/23/2017.  01/04/2017. FINDINGS: As seen previously, the superior sagittal sinus is patent. Right transverse sinus and jugular vein are patent. 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 Generalized brain atrophy as seen previously with chronic small-vessel ischemic changes of the white matter. Ventricular size is stable. No extra-axial collection. Changes of chronic treated left mastoiditis are unchanged from the prior study. IMPRESSION: Stable CT venography. 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 Other intracranial venous structures appear patent and normal. Electronically Signed   By: Paulina Fusi M.D.   On: 08/20/2017 16:17    Assessment/Plan  1. Cerebral venous thrombosis - on Xarelto 20 mg 1 tab daily   2. Seizure (HCC) - follows up with neurology, Dr. Baldo Daub, continue Keppra 750 mg 1 tab in the morning and 2 tabs at at bedtime   3. Chronic pain syndrome - pain is well-controlled, has pain pain pump on his RLQ, continue gabapentin 800 mg 1 tab 3 times a day, duloxetine3 0 mg 1 tab daily at bedtime   4. BPH with obstruction/lower urinary tract symptoms - continue Flomax 0.4 mg 1 capsule daily   5. Urinary urgency - stable, continue Myrbetriq ER 24-hour 50 mg 1 tab daily   6. Constipation due to opioid therapy - continue Movantik 2.5 mg 1 tab every morning, miraLAX 17 g daily when necessary and Senna plus 8.6-50 mg 2 tabs twice a day when necessary   7. Gastroesophageal reflux disease,  esophagitis presence not specified - stable, continue omeprazole 20 mg 1 capsule daily and Ranitidine 150 mg 1 tablet twice a day     Family/ staff Communication: Discussed plan of care with resident  Labs/tests ordered:  None  Goals of care:   Long term care   Kenard Gower, NP [facility] and Adult Medicine [phone] (Monday-Friday 8:00 a.m. - 5:00 p.m.) [phone] (after hours)

## 2017-09-15 ENCOUNTER — Encounter
Admission: RE | Admit: 2017-09-15 | Discharge: 2017-09-15 | Disposition: A | Discharge status: Home / Self Care | Payer: Medicare Other | Source: Ambulatory Visit | Attending: Internal Medicine | Admitting: Internal Medicine

## 2017-09-17 ENCOUNTER — Other Ambulatory Visit
Admission: RE | Admit: 2017-09-17 | Discharge: 2017-09-17 | Disposition: A | Discharge status: Home / Self Care | Payer: Medicare Other | Source: Ambulatory Visit | Attending: Gerontology | Admitting: Gerontology

## 2017-09-17 DIAGNOSIS — H7012 Chronic mastoiditis, left ear: Secondary | ICD-10-CM | POA: Insufficient documentation

## 2017-09-17 LAB — CBC WITH DIFFERENTIAL/PLATELET
BASOS ABS: 0 10*3/uL (ref 0–0.1)
BASOS PCT: 1 %
EOS ABS: 0.1 10*3/uL (ref 0–0.7)
EOS PCT: 4 %
HCT: 34.1 % — ABNORMAL LOW (ref 40.0–52.0)
Hemoglobin: 11.6 g/dL — ABNORMAL LOW (ref 13.0–18.0)
LYMPHS PCT: 36 %
Lymphs Abs: 1.1 10*3/uL (ref 1.0–3.6)
MCH: 31.2 pg (ref 26.0–34.0)
MCHC: 34.1 g/dL (ref 32.0–36.0)
MCV: 91.5 fL (ref 80.0–100.0)
Monocytes Absolute: 0.4 10*3/uL (ref 0.2–1.0)
Monocytes Relative: 14 %
NEUTROS ABS: 1.5 10*3/uL (ref 1.4–6.5)
Neutrophils Relative %: 45 %
PLATELETS: 133 10*3/uL — AB (ref 150–440)
RBC: 3.72 MIL/uL — ABNORMAL LOW (ref 4.40–5.90)
RDW: 14.5 % (ref 11.5–14.5)
WBC: 3.2 10*3/uL — ABNORMAL LOW (ref 3.8–10.6)

## 2017-10-03 ENCOUNTER — Non-Acute Institutional Stay (SKILLED_NURSING_FACILITY): Payer: Medicare Other | Admitting: Adult Health

## 2017-10-03 ENCOUNTER — Encounter: Payer: Self-pay | Admitting: Adult Health

## 2017-10-03 DIAGNOSIS — N401 Enlarged prostate with lower urinary tract symptoms: Secondary | ICD-10-CM

## 2017-10-03 DIAGNOSIS — N138 Other obstructive and reflux uropathy: Secondary | ICD-10-CM | POA: Diagnosis not present

## 2017-10-03 DIAGNOSIS — K219 Gastro-esophageal reflux disease without esophagitis: Secondary | ICD-10-CM | POA: Diagnosis not present

## 2017-10-03 DIAGNOSIS — I639 Cerebral infarction, unspecified: Secondary | ICD-10-CM | POA: Diagnosis not present

## 2017-10-03 NOTE — Progress Notes (Signed)
Location:   [facility] Room Number: 325A Place of Service:  SNF (31)   CODE STATUS: DNR  Allergies  Allergen Reactions  . Amitriptyline Rash  . Nortriptyline Other (See Comments) and Rash    GI  upset    Chief Complaint  Patient presents with  . Acute Visit    Frequent voiding at night    HPI:    Past Medical History:  Diagnosis Date  . Abdominal pain    other specified site  . Abnormal gait   . Advance directive in chart 10/12/2015  . Anemia   . Arthritis   . BPH (benign prostatic hyperplasia)   . Cataract   . Cervical spondylosis without myelopathy   . Cognitive dysfunction   . DDD (degenerative disc disease), cervical 06/29/2013  . Degeneration of cervical intervertebral disc   . Degeneration of lumbar intervertebral disc   . Depression   . Excessive daytime sleepiness   . Generalized osteoarthritis of multiple sites 11/19/2013  . GERD (gastroesophageal reflux disease)   . Hallux varus, acquired   . Hiatal hernia   . Hyperlipidemia   . Hypertension   . Hypogonadism in male   . Impaired functional mobility, balance, gait, and endurance 11/14/2015   uses motorized wheelchair  . Insomnia 06/29/2013  . Low back pain   . Lumbosacral spondylosis without myelopathy   . Memory loss   . Mild cognitive impairment   . Neurogenic bladder   . OP (osteoporosis)   . Organic sleep apnea   . Osteoporosis 03/15/2004  . Peripheral neuropathy   . Radial nerve palsy   . Scoliosis 06/29/2013  . Seizure (HCC) 07/31/2016  . Sleep apnea    CPAP to bring DOS sleep study done in [place]  . Spinal compression fracture (HCC)   . Spinal stenosis   . Spondylolisthesis, acquired   . Urinary incontinence, stress, male 08/10/2015  . Urinary urgency   . Uses hearing aid 10/12/2015  . Venous stasis   . Venous stasis dermatitis of both lower extremities 06/29/2013  . Vision abnormalities   . Vitamin B12 deficiency 09/27/2016  . Wrist drop,  bilateral     Past Surgical History:  Procedure Laterality Date  . CARPAL TUNNEL RELEASE Left 03/16/2016   Dr. Kennedy Bucker  . CERVICAL SPINE SURGERY    . CORNEAL EYE SURGERY Right 09/08/2014   PTK (Dr. Chales Abrahams)  . HEMORRHOID SURGERY    . HERNIA REPAIR     left side  . INTRATHECAL PUMP IMPLANTATION  04/28/2014   Procedure: REVISION INTRATHECAL CATH; Surgeon: Burman Blacksmith, MD; Location: [facility] OR; Service: Anes/ Pain Mgmt; Laterality: N/A;  . JOINT REPLACEMENT Bilateral    bilateral knee replacements  . LUMBAR SPINE SURGERY    . PAIN PUMP IMPLANTATION     multiple procedures for removal and insertion this is his 3rd pain pump  . PAIN PUMP REMOVAL     multiple procedures for removal and insertion this is his 3rd pain pump  . Proleive System Treatment  2006  . ROTATOR CUFF REPAIR Right    x2  . SPINAL CORD STIMULATOR IMPLANT     x2  . THORACIC SPINE SURGERY    . TONSILLECTOMY      Social History   Socioeconomic History  . Marital status: Married    Spouse name: Not on file  . Number of children: Not on file  . Years of education: Not on file  . Highest  education level: Not on file  Occupational History  . Not on file  Social Needs  . Financial resource strain: Not on file  . Food insecurity:    Worry: Not on file    Inability: Not on file  . Transportation needs:    Medical: Not on file    Non-medical: Not on file  Tobacco Use  . Smoking status: Never Smoker  . Smokeless tobacco: Never Used  Substance and Sexual Activity  . Alcohol use: No    Alcohol/week: 0.0 standard drinks    Comment: history of alcohol addiction, attends regular AA meetings  . Drug use: No  . Sexual activity: Not on file  Lifestyle  . Physical activity:    Days per week: Not on file    Minutes per session: Not on file  . Stress: Not on file  Relationships  . Social connections:    Talks on phone: Not on file    Gets together: Not on file    Attends religious service: Not on  file    Active member of club or organization: Not on file    Attends meetings of clubs or organizations: Not on file    Relationship status: Not on file  . Intimate partner violence:    Fear of current or ex partner: Not on file    Emotionally abused: Not on file    Physically abused: Not on file    Forced sexual activity: Not on file  Other Topics Concern  . Not on file  Social History Narrative  . Not on file   Family History  Problem Relation Age of Onset  . Stroke Father   . Alcohol abuse Father   . Diabetes type II Father   . Heart disease Father        pacemaker  . Diabetes type II Brother   . Heart disease Brother        pacemaker  . Stroke Mother   . COPD Mother   . Alcohol abuse Brother   . Arthritis Sister   . Glaucoma Paternal Uncle   . Sleep apnea Son   . Kidney disease Neg Hx   . Prostate cancer Neg Hx   . Macular degeneration Neg Hx       VITAL SIGNS BP 132/80   Pulse 76   Temp (!) 97.2 F (36.2 C) (Oral)   Resp 20   Ht 5\' 9"  (1.753 m)   Wt 184 lb 9.6 oz (83.7 kg)   SpO2 97%   BMI 27.26 kg/m   Outpatient Encounter Medications as of 10/03/2017  Medication Sig  . acetaminophen (TYLENOL) 325 MG tablet Take 975 mg by mouth every 8 (eight) hours as needed.  [name] Kitchen alendronate (FOSAMAX) 70 MG tablet Take 70 mg by mouth once a week. On Thursday  . alum & mag hydroxide-simeth (MAALOX PLUS) 400-400-40 MG/5ML suspension Take 15 mLs by mouth 3 (three) times daily with meals. Also take 15 mls by mouth 3 times daily with meals for Indigestion/ Reflux/ Gas  . ammonium lactate (LAC-HYDRIN) 12 % lotion Apply 1 application topically 2 (two) times daily.   . calcium carbonate (OSCAL) 1500 (600 Ca) MG TABS tablet Take 600 mg of elemental calcium by mouth daily.  [name] Kitchen docusate sodium (COLACE) 100 MG capsule Take 100 mg by mouth daily as needed.   . DULoxetine (CYMBALTA) 30 MG capsule Take 30 mg by mouth at bedtime.  . gabapentin (NEURONTIN) 800 MG tablet Take 800 mg by  mouth 3 (three) times daily.  . Infant Care Products [facility] EX) Apply liberal amount topically  to area of skin irritation prn. OK to leave at bedside.  . levETIRAcetam (KEPPRA) 750 MG tablet Take 750 mg by mouth at bedtime. Monitor for sedation  . levETIRAcetam (KEPPRA) 750 MG tablet Take 750 mg by mouth daily. Monitor for sedation  . Menthol, Topical Analgesic, (BIOFREEZE) 4 % GEL Apply topically topically to  bilateral shoulders twice daily for joint pain  . mirabegron ER (MYRBETRIQ) 50 MG TB24 tablet Take 1 tablet (50 mg total) by mouth daily.  . naloxegol oxalate (MOVANTIK) 12.5 MG TABS tablet Take 12.5 mg by mouth every morning.  [name] Kitchen omeprazole (PRILOSEC) 20 MG capsule Take 20 mg by mouth daily.  . Ostomy Supplies (SECURI-T NO STING WIPE) MISC Apply topically to  left foot 2nd and 3rd toes apply skin prep on dressing to affected areas twice daily. Allow to dry before donning socks  . polyethylene glycol (MIRALAX / GLYCOLAX) packet Take 17 g by mouth daily as needed.   [name] Kitchen Propylene Glycol-Glycerin (ARTIFICIAL TEARS) 1-0.3 % SOLN Place 1 drop into both eyes 2 (two) times daily as needed.  . ranitidine (ZANTAC) 150 MG tablet Take 150 mg by mouth 2 (two) times daily.  . rivaroxaban (XARELTO) 20 MG TABS tablet Take 20 mg by mouth daily with supper.  . sennosides-docusate sodium (SENOKOT-S) 8.6-50 MG tablet Take 2 tablets by mouth 2 (two) times daily as needed for constipation.   . Skin Protectants, Misc. (EUCERIN) cream Wash feet with soap and water, dry. Apply Eucerin cream to dry, scaly skin daily, prn  . tamsulosin (FLOMAX) 0.4 MG CAPS capsule Take 0.4 mg by mouth daily.  [name] Kitchen UNABLE TO FIND Regular diet. Aspiration precautions  . vitamin C (ASCORBIC ACID) 500 MG tablet Take 500 mg by mouth 2 (two) times daily.   No facility-administered encounter medications on file as of 10/03/2017.      SIGNIFICANT DIAGNOSTIC EXAMS  TODAY:   03-18-17: wbc 4.7; hgb 12.7; hct 38.2; mcv 93.3; plt 179;  glucose 101; bun 23; creat 0.67; k+ 4.1; na++ 137; ca 8.9 liver normal albumin 3.4  Vit B 12: 758; vit D 34.1;  tsh 1.844;  09-17-17: wbc 3.2; hgb 11.6; hct 34.1; mcv 91.;5 plt 133   Review of Systems  Constitutional: Negative for malaise/fatigue.  Respiratory: Negative for cough and shortness of breath.   Cardiovascular: Negative for chest pain, palpitations and leg swelling.  Gastrointestinal: Negative for abdominal pain, constipation and heartburn.  Genitourinary: Positive for frequency and urgency.       Up all night urinating.   Musculoskeletal: Negative for back pain, joint pain and myalgias.  Skin: Negative.   Neurological: Negative for dizziness.  Psychiatric/Behavioral: The patient is not nervous/anxious.    Physical Exam  Constitutional: He is oriented to person, place, and time. He appears well-developed and well-nourished. No distress.  Neck: No thyromegaly present.  Cardiovascular: Normal rate, regular rhythm, normal heart sounds and intact distal pulses.  Pulmonary/Chest: Effort normal and breath sounds normal. No respiratory distress.  Abdominal: Soft. Bowel sounds are normal. He exhibits no distension. There is no tenderness.  Pain pump right lower quad   Musculoskeletal: Normal range of motion. He exhibits no edema.  Lymphadenopathy:    He has no cervical adenopathy.  Neurological: He is alert and oriented to person, place, and time.  Skin: Skin is warm and dry. He is not diaphoretic.  Psychiatric: He has a normal mood  and affect.      ASSESSMENT/ PLAN:  TODAY:   1. gerd without esophagitis: is stable will continue zantac 150 mg twice daily and prilosec 20 mg daily   2. 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 BPH with obstruction/lower urinary tract symptoms: is worse will continue flomax 0.4 mg daily and myrbetriq 50 mg daily  Will get UA/C&S and will monitor   MD is aware of resident's narcotic use and is in  agreement with current plan of care. We will attempt to wean resident as apropriate   Synthia Innocenteborah Matthew Pais NP [facility]  Contact [phone] Monday through Friday 8am- 5pm  After hours call [phone]

## 2017-10-04 ENCOUNTER — Other Ambulatory Visit
Admission: RE | Admit: 2017-10-04 | Discharge: 2017-10-04 | Disposition: A | Discharge status: Home / Self Care | Payer: Medicare Other | Source: Ambulatory Visit | Attending: Adult Health | Admitting: Adult Health

## 2017-10-04 DIAGNOSIS — M7918 Myalgia, other site: Secondary | ICD-10-CM | POA: Diagnosis present

## 2017-10-04 LAB — URINALYSIS, COMPLETE (UACMP) WITH MICROSCOPIC
Bacteria, UA: NONE SEEN
Bilirubin Urine: NEGATIVE
GLUCOSE, UA: NEGATIVE mg/dL
Hgb urine dipstick: NEGATIVE
Ketones, ur: NEGATIVE mg/dL
Leukocytes, UA: NEGATIVE
Nitrite: NEGATIVE
PROTEIN: NEGATIVE mg/dL
SPECIFIC GRAVITY, URINE: 1.012 (ref 1.005–1.030)
SQUAMOUS EPITHELIAL / LPF: NONE SEEN (ref 0–5)
pH: 7 (ref 5.0–8.0)

## 2017-10-05 LAB — URINE CULTURE: CULTURE: NO GROWTH

## 2017-10-14 ENCOUNTER — Encounter: Payer: Self-pay | Admitting: Adult Health

## 2017-10-14 ENCOUNTER — Non-Acute Institutional Stay (SKILLED_NURSING_FACILITY): Payer: Medicare Other | Admitting: Adult Health

## 2017-10-14 DIAGNOSIS — K5903 Drug induced constipation: Secondary | ICD-10-CM | POA: Diagnosis not present

## 2017-10-14 DIAGNOSIS — I639 Cerebral infarction, unspecified: Secondary | ICD-10-CM | POA: Diagnosis not present

## 2017-10-14 DIAGNOSIS — F418 Other specified anxiety disorders: Secondary | ICD-10-CM

## 2017-10-14 DIAGNOSIS — M5137 Other intervertebral disc degeneration, lumbosacral region: Secondary | ICD-10-CM | POA: Diagnosis not present

## 2017-10-14 DIAGNOSIS — G894 Chronic pain syndrome: Secondary | ICD-10-CM

## 2017-10-14 DIAGNOSIS — N138 Other obstructive and reflux uropathy: Secondary | ICD-10-CM

## 2017-10-14 DIAGNOSIS — R569 Unspecified convulsions: Secondary | ICD-10-CM | POA: Diagnosis not present

## 2017-10-14 DIAGNOSIS — N401 Enlarged prostate with lower urinary tract symptoms: Secondary | ICD-10-CM

## 2017-10-14 DIAGNOSIS — T402X5A Adverse effect of other opioids, initial encounter: Secondary | ICD-10-CM

## 2017-10-14 DIAGNOSIS — M810 Age-related osteoporosis without current pathological fracture: Secondary | ICD-10-CM

## 2017-10-14 DIAGNOSIS — M47816 Spondylosis without myelopathy or radiculopathy, lumbar region: Secondary | ICD-10-CM

## 2017-10-14 NOTE — Progress Notes (Addendum)
Location:   [facility] Room Number: 325 A Place of Service:  SNF (31)   CODE STATUS: DNR  Allergies  Allergen Reactions  . Amitriptyline Rash  . Nortriptyline Other (See Comments) and Rash    GI  upset    Chief Complaint  Patient presents with  . Medical Management of Chronic Issues    Seizure; cva; constipation.     HPI:  He is a 82 year old long term resident of this facility being seen for the management of his chronic illnesses: seizure; cva; constipation. He denies any changes in appetite; denies any constipation and denies any uncontrolled pain.   Past Medical History:  Diagnosis Date  . Abdominal pain    other specified site  . Abnormal gait   . Advance directive in chart 10/12/2015  . Anemia   . Arthritis   . BPH (benign prostatic hyperplasia)   . Cataract   . Cervical spondylosis without myelopathy   . Cognitive dysfunction   . DDD (degenerative disc disease), cervical 06/29/2013  . Degeneration of cervical intervertebral disc   . Degeneration of lumbar intervertebral disc   . Depression   . Excessive daytime sleepiness   . Generalized osteoarthritis of multiple sites 11/19/2013  . GERD (gastroesophageal reflux disease)   . Hallux varus, acquired   . Hiatal hernia   . Hyperlipidemia   . Hypertension   . Hypogonadism in male   . Impaired functional mobility, balance, gait, and endurance 11/14/2015   uses motorized wheelchair  . Insomnia 06/29/2013  . Low back pain   . Lumbosacral spondylosis without myelopathy   . Memory loss   . Mild cognitive impairment   . Neurogenic bladder   . OP (osteoporosis)   . Organic sleep apnea   . Osteoporosis 03/15/2004  . Peripheral neuropathy   . Radial nerve palsy   . Scoliosis 06/29/2013  . Seizure (HCC) 07/31/2016  . Sleep apnea    CPAP to bring DOS sleep study done in [place]  . Spinal compression fracture (HCC)   . Spinal stenosis   . Spondylolisthesis, acquired   .  Urinary incontinence, stress, male 08/10/2015  . Urinary urgency   . Uses hearing aid 10/12/2015  . Venous stasis   . Venous stasis dermatitis of both lower extremities 06/29/2013  . Vision abnormalities   . Vitamin B12 deficiency 09/27/2016  . Wrist drop, bilateral     Past Surgical History:  Procedure Laterality Date  . CARPAL TUNNEL RELEASE Left 03/16/2016   Dr. Kennedy Bucker  . CERVICAL SPINE SURGERY    . CORNEAL EYE SURGERY Right 09/08/2014   PTK (Dr. Chales Abrahams)  . HEMORRHOID SURGERY    . HERNIA REPAIR     left side  . INTRATHECAL PUMP IMPLANTATION  04/28/2014   Procedure: REVISION INTRATHECAL CATH; Surgeon: Burman Blacksmith, MD; Location: [facility] OR; Service: Anes/ Pain Mgmt; Laterality: N/A;  . JOINT REPLACEMENT Bilateral    bilateral knee replacements  . LUMBAR SPINE SURGERY    . PAIN PUMP IMPLANTATION     multiple procedures for removal and insertion this is his 3rd pain pump  . PAIN PUMP REMOVAL     multiple procedures for removal and insertion this is his 3rd pain pump  . Proleive System Treatment  2006  . ROTATOR CUFF REPAIR Right    x2  . SPINAL CORD STIMULATOR IMPLANT     x2  . THORACIC SPINE SURGERY    . TONSILLECTOMY  Social History   Socioeconomic History  . Marital status: Married    Spouse name: Not on file  . Number of children: Not on file  . Years of education: Not on file  . Highest education level: Not on file  Occupational History  . Not on file  Social Needs  . Financial resource strain: Not on file  . Food insecurity:    Worry: Not on file    Inability: Not on file  . Transportation needs:    Medical: Not on file    Non-medical: Not on file  Tobacco Use  . Smoking status: Never Smoker  . Smokeless tobacco: Never Used  Substance and Sexual Activity  . Alcohol use: No    Alcohol/week: 0.0 standard drinks    Comment: history of alcohol addiction, attends regular AA meetings  . Drug use: No  . Sexual activity: Not on file    Lifestyle  . Physical activity:    Days per week: Not on file    Minutes per session: Not on file  . Stress: Not on file  Relationships  . Social connections:    Talks on phone: Not on file    Gets together: Not on file    Attends religious service: Not on file    Active member of club or organization: Not on file    Attends meetings of clubs or organizations: Not on file    Relationship status: Not on file  . Intimate partner violence:    Fear of current or ex partner: Not on file    Emotionally abused: Not on file    Physically abused: Not on file    Forced sexual activity: Not on file  Other Topics Concern  . Not on file  Social History Narrative  . Not on file   Family History  Problem Relation Age of Onset  . Stroke Father   . Alcohol abuse Father   . Diabetes type II Father   . Heart disease Father        pacemaker  . Diabetes type II Brother   . Heart disease Brother        pacemaker  . Stroke Mother   . COPD Mother   . Alcohol abuse Brother   . Arthritis Sister   . Glaucoma Paternal Uncle   . Sleep apnea Son   . Kidney disease Neg Hx   . Prostate cancer Neg Hx   . Macular degeneration Neg Hx       VITAL SIGNS BP (!) 132/59   Pulse 64   Temp (!) 97.5 F (36.4 C)   Resp 18   Ht 5\' 9"  (1.753 m)   Wt 184 lb 9.6 oz (83.7 kg)   SpO2 100%   BMI 27.26 kg/m   Outpatient Encounter Medications as of 10/14/2017  Medication Sig  . acetaminophen (TYLENOL) 325 MG tablet Take 975 mg by mouth every 8 (eight) hours as needed.  [name] Kitchen alendronate (FOSAMAX) 70 MG tablet Take 70 mg by mouth once a week. On Thursday  . alum & mag hydroxide-simeth (MAALOX PLUS) 400-400-40 MG/5ML suspension Take 15 mLs by mouth 3 (three) times daily with meals. Also take 15 mls by mouth 3 times daily with meals for Indigestion/ Reflux/ Gas  . ammonium lactate (LAC-HYDRIN) 12 % lotion Apply 1 application topically 2 (two) times daily.   . calcium carbonate (OSCAL) 1500 (600 Ca) MG TABS  tablet Take 600 mg of elemental calcium by mouth daily.  [name] Kitchen docusate  sodium (COLACE) 100 MG capsule Take 100 mg by mouth daily as needed.   . DULoxetine (CYMBALTA) 30 MG capsule Take 30 mg by mouth at bedtime.  . gabapentin (NEURONTIN) 800 MG tablet Take 800 mg by mouth 3 (three) times daily.  . Infant Care Products [facility] EX) Apply liberal amount topically  to area of skin irritation prn. OK to leave at bedside.  . levETIRAcetam (KEPPRA) 750 MG tablet Take 750 mg by mouth at bedtime. Monitor for sedation  . levETIRAcetam (KEPPRA) 750 MG tablet Take 750 mg by mouth daily. Monitor for sedation  . Menthol, Topical Analgesic, (BIOFREEZE) 4 % GEL Apply topically topically to  bilateral shoulders twice daily for joint pain  . mirabegron ER (MYRBETRIQ) 50 MG TB24 tablet Take 1 tablet (50 mg total) by mouth daily.  . naloxegol oxalate (MOVANTIK) 12.5 MG TABS tablet Take 12.5 mg by mouth every morning.  [name] Kitchen omeprazole (PRILOSEC) 20 MG capsule Take 20 mg by mouth daily.  . Ostomy Supplies (SECURI-T NO STING WIPE) MISC Apply topically to  left foot 2nd and 3rd toes apply skin prep on dressing to affected areas twice daily. Allow to dry before donning socks  . polyethylene glycol (MIRALAX / GLYCOLAX) packet Take 17 g by mouth daily as needed.   [name] Kitchen Propylene Glycol-Glycerin (ARTIFICIAL TEARS) 1-0.3 % SOLN Place 1 drop into both eyes 2 (two) times daily as needed.  . ranitidine (ZANTAC) 150 MG tablet Take 150 mg by mouth 2 (two) times daily.  . rivaroxaban (XARELTO) 20 MG TABS tablet Take 20 mg by mouth daily with supper.  . sennosides-docusate sodium (SENOKOT-S) 8.6-50 MG tablet Take 2 tablets by mouth 2 (two) times daily as needed for constipation.   . Skin Protectants, Misc. (EUCERIN) cream Wash feet with soap and water, dry. Apply Eucerin cream to dry, scaly skin daily, prn  . tamsulosin (FLOMAX) 0.4 MG CAPS capsule Take 0.4 mg by mouth daily.  [name] Kitchen UNABLE TO FIND Regular diet. Aspiration precautions  .  vitamin C (ASCORBIC ACID) 500 MG tablet Take 500 mg by mouth 2 (two) times daily.   No facility-administered encounter medications on file as of 10/14/2017.      SIGNIFICANT DIAGNOSTIC EXAMS  PREVIOUS:   03-18-17: wbc 4.7; hgb 12.7; hct 38.2; mcv 93.3; plt 179; glucose 101; bun 23; creat 0.67; k+ 4.1; na++ 137; ca 8.9 liver normal albumin 3.4  Vit B 12: 758; vit D 34.1;  tsh 1.844;  09-17-17: wbc 3.2; hgb 11.6; hct 34.1; mcv 91.;5 plt 133   TODAY:   10-04-17: urine culture: no growth   Review of Systems  Constitutional: Negative for malaise/fatigue.  Respiratory: Negative for cough and shortness of breath.   Cardiovascular: Negative for chest pain, palpitations and leg swelling.  Gastrointestinal: Negative for abdominal pain, constipation and heartburn.  Genitourinary:       Has nocturia   Musculoskeletal: Negative for back pain, joint pain and myalgias.  Skin: Negative.   Neurological: Negative for dizziness.  Psychiatric/Behavioral: The patient is not nervous/anxious.    Physical Exam  Constitutional: He is oriented to person, place, and time. He appears well-developed and well-nourished. No distress.  Neck: No thyromegaly present.  Cardiovascular: Normal rate, regular rhythm, normal heart sounds and intact distal pulses.  Pulmonary/Chest: Effort normal and breath sounds normal. No respiratory distress.  Abdominal: Soft. Bowel sounds are normal. He exhibits no distension. There is no tenderness.  Musculoskeletal: Normal range of motion. He exhibits no edema.  Has scoliosis and kyphoscoliosis  Lymphadenopathy:    He has no cervical adenopathy.  Neurological: He is alert and oriented to person, place, and time.  Pain pump right lower quad   Skin: Skin is warm and dry. He is not diaphoretic.  Psychiatric: He has a normal mood and affect.     ASSESSMENT/ PLAN:  TODAY:   1. gerd without esophagitis: is stable will continue zantac 150 mg twice daily and prilosec 20 mg daily     2. 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 BPH with obstruction/lower urinary tract symptoms: is stable  will continue flomax 0.4 mg daily and myrbetriq 50 mg daily    4. Seizure: no reports of seizure activity present: will continue keppra 750 mg twice daily   5. Constipation due to opioid therapy: is stable will continue movantic 12.5 mg daily has prn colace; miralax and senna s  6.  DDD lumbosacral/degenerative arthritis of lumbar spine/ chronic pain syndrome: has pain pump: is stable will continue neuronton 800 mg three times daily biofreeze to shoulders twice daily   8. Osteoporosis: is stable will continue fosamax 70 mg weekly is taking calcium supplements  9. Depression with anxiety: is emotionally stable will continue cymbalta 30 mg daily   Will check cmp; tsh vit b12; vit d ;mag   MD is aware of resident's narcotic use and is in agreement with current plan of care. We will attempt to wean resident as apropriate   Synthia Innocent NP [facility]  Contact [phone] Monday through Friday 8am- 5pm  After hours call [phone]

## 2017-10-15 ENCOUNTER — Encounter
Admission: RE | Admit: 2017-10-15 | Discharge: 2017-10-15 | Disposition: A | Discharge status: Home / Self Care | Payer: Medicare Other | Source: Ambulatory Visit | Attending: Internal Medicine | Admitting: Internal Medicine

## 2017-10-15 ENCOUNTER — Other Ambulatory Visit
Admission: RE | Admit: 2017-10-15 | Discharge: 2017-10-15 | Disposition: A | Discharge status: Home / Self Care | Payer: Medicare Other | Source: Ambulatory Visit | Attending: Adult Health | Admitting: Adult Health

## 2017-10-15 DIAGNOSIS — G40309 Generalized idiopathic epilepsy and epileptic syndromes, not intractable, without status epilepticus: Secondary | ICD-10-CM | POA: Diagnosis present

## 2017-10-15 LAB — VITAMIN B12: Vitamin B-12: 440 pg/mL (ref 180–914)

## 2017-10-15 LAB — COMPREHENSIVE METABOLIC PANEL
ALT: 19 U/L (ref 0–44)
AST: 30 U/L (ref 15–41)
Albumin: 3.6 g/dL (ref 3.5–5.0)
Alkaline Phosphatase: 83 U/L (ref 38–126)
Anion gap: 7 (ref 5–15)
BILIRUBIN TOTAL: 1 mg/dL (ref 0.3–1.2)
BUN: 24 mg/dL — AB (ref 8–23)
CHLORIDE: 101 mmol/L (ref 98–111)
CO2: 32 mmol/L (ref 22–32)
CREATININE: 0.91 mg/dL (ref 0.61–1.24)
Calcium: 9 mg/dL (ref 8.9–10.3)
GFR calc Af Amer: >60 mL/min (ref >60)
GLUCOSE: 103 mg/dL — AB (ref 70–99)
POTASSIUM: 4.2 mmol/L (ref 3.5–5.1)
SODIUM: 140 mmol/L (ref 135–145)
TOTAL PROTEIN: 6.2 g/dL — AB (ref 6.5–8.1)

## 2017-10-15 LAB — MAGNESIUM: MAGNESIUM: 2.4 mg/dL (ref 1.7–2.4)

## 2017-10-15 LAB — TSH: TSH: 2.87 u[IU]/mL (ref 0.350–4.500)

## 2017-10-16 LAB — VITAMIN D 25 HYDROXY (VIT D DEFICIENCY, FRACTURES): VIT D 25 HYDROXY: 36 ng/mL (ref 30.0–100.0)

## 2017-10-21 ENCOUNTER — Other Ambulatory Visit
Admission: RE | Admit: 2017-10-21 | Discharge: 2017-10-21 | Disposition: A | Discharge status: Home / Self Care | Payer: Medicare Other | Source: Ambulatory Visit | Attending: Internal Medicine | Admitting: Internal Medicine

## 2017-10-21 DIAGNOSIS — G40909 Epilepsy, unspecified, not intractable, without status epilepticus: Secondary | ICD-10-CM | POA: Diagnosis present

## 2017-10-22 LAB — LEVETIRACETAM LEVEL: LEVETIRACETAM: 25.8 ug/mL (ref 10.0–40.0)

## 2017-10-26 DIAGNOSIS — G894 Chronic pain syndrome: Secondary | ICD-10-CM | POA: Insufficient documentation

## 2017-11-15 ENCOUNTER — Encounter
Admission: RE | Admit: 2017-11-15 | Discharge: 2017-11-15 | Disposition: A | Discharge status: Home / Self Care | Payer: Medicare Other | Source: Ambulatory Visit | Attending: Internal Medicine | Admitting: Internal Medicine

## 2017-11-18 ENCOUNTER — Encounter: Payer: Self-pay | Admitting: Adult Health

## 2017-11-18 ENCOUNTER — Non-Acute Institutional Stay (SKILLED_NURSING_FACILITY): Payer: Medicare Other | Admitting: Adult Health

## 2017-11-18 DIAGNOSIS — M810 Age-related osteoporosis without current pathological fracture: Secondary | ICD-10-CM | POA: Diagnosis not present

## 2017-11-18 DIAGNOSIS — M47816 Spondylosis without myelopathy or radiculopathy, lumbar region: Secondary | ICD-10-CM | POA: Diagnosis not present

## 2017-11-18 DIAGNOSIS — M5137 Other intervertebral disc degeneration, lumbosacral region: Secondary | ICD-10-CM | POA: Diagnosis not present

## 2017-11-18 DIAGNOSIS — K5903 Drug induced constipation: Secondary | ICD-10-CM

## 2017-11-18 DIAGNOSIS — R569 Unspecified convulsions: Secondary | ICD-10-CM

## 2017-11-18 DIAGNOSIS — M51379 Other intervertebral disc degeneration, lumbosacral region without mention of lumbar back pain or lower extremity pain: Secondary | ICD-10-CM

## 2017-11-18 DIAGNOSIS — T402X5A Adverse effect of other opioids, initial encounter: Secondary | ICD-10-CM

## 2017-11-18 NOTE — Progress Notes (Signed)
Location:   [facility] Room Number: 325 A Place of Service:  SNF (31)   CODE STATUS: DNR  Allergies  Allergen Reactions  . Amitriptyline Rash  . Nortriptyline Other (See Comments) and Rash    GI  upset    Chief Complaint  Patient presents with  . Medical Management of Chronic Issues    Seizure; DDD (degenerative disc disease), lumbosacral; osteoarthritis of lumbar spine, unspecified spinal osteoarthritis complication status; osteoporosis without current pathological fracture, unspecified osteoporosis type; constipation due to opioid therapy.     HPI:  He is a 82 year old long term resident of this facility being seen for the management of his chronic illnesses: seizure; DDD; osteoarthritis; osteoporosis; constipation. He denies any seizure activity; no uncontrolled pain; no changes in appetite; no insomnia.   Past Medical History:  Diagnosis Date  . Abdominal pain    other specified site  . Abnormal gait   . Advance directive in chart 10/12/2015  . Anemia   . Arthritis   . BPH (benign prostatic hyperplasia)   . Cataract   . Cervical spondylosis without myelopathy   . Cognitive dysfunction   . DDD (degenerative disc disease), cervical 06/29/2013  . Degeneration of cervical intervertebral disc   . Degeneration of lumbar intervertebral disc   . Depression   . Excessive daytime sleepiness   . Generalized osteoarthritis of multiple sites 11/19/2013  . GERD (gastroesophageal reflux disease)   . Hallux varus, acquired   . Hiatal hernia   . Hyperlipidemia   . Hypertension   . Hypogonadism in male   . Impaired functional mobility, balance, gait, and endurance 11/14/2015   uses motorized wheelchair  . Insomnia 06/29/2013  . Low back pain   . Lumbosacral spondylosis without myelopathy   . Memory loss   . Mild cognitive impairment   . Neurogenic bladder   . OP (osteoporosis)   . Organic sleep apnea   . Osteoporosis 03/15/2004  . Peripheral  neuropathy   . Radial nerve palsy   . Scoliosis 06/29/2013  . Seizure (HCC) 07/31/2016  . Sleep apnea    CPAP to bring DOS sleep study done in [place]  . Spinal compression fracture (HCC)   . Spinal stenosis   . Spondylolisthesis, acquired   . Urinary incontinence, stress, male 08/10/2015  . Urinary urgency   . Uses hearing aid 10/12/2015  . Venous stasis   . Venous stasis dermatitis of both lower extremities 06/29/2013  . Vision abnormalities   . Vitamin B12 deficiency 09/27/2016  . Wrist drop, bilateral     Past Surgical History:  Procedure Laterality Date  . CARPAL TUNNEL RELEASE Left 03/16/2016   Dr. Kennedy Bucker  . CERVICAL SPINE SURGERY    . CORNEAL EYE SURGERY Right 09/08/2014   PTK (Dr. Chales Abrahams)  . HEMORRHOID SURGERY    . HERNIA REPAIR     left side  . INTRATHECAL PUMP IMPLANTATION  04/28/2014   Procedure: REVISION INTRATHECAL CATH; Surgeon: Burman Blacksmith, MD; Location: [facility] OR; Service: Anes/ Pain Mgmt; Laterality: N/A;  . JOINT REPLACEMENT Bilateral    bilateral knee replacements  . LUMBAR SPINE SURGERY    . PAIN PUMP IMPLANTATION     multiple procedures for removal and insertion this is his 3rd pain pump  . PAIN PUMP REMOVAL     multiple procedures for removal and insertion this is his 3rd pain pump  . Proleive System Treatment  2006  . ROTATOR CUFF REPAIR Right  x2  . SPINAL CORD STIMULATOR IMPLANT     x2  . THORACIC SPINE SURGERY    . TONSILLECTOMY      Social History   Socioeconomic History  . Marital status: Married    Spouse name: Not on file  . Number of children: Not on file  . Years of education: Not on file  . Highest education level: Not on file  Occupational History  . Not on file  Social Needs  . Financial resource strain: Not on file  . Food insecurity:    Worry: Not on file    Inability: Not on file  . Transportation needs:    Medical: Not on file    Non-medical: Not on file  Tobacco Use  . Smoking status: Never  Smoker  . Smokeless tobacco: Never Used  Substance and Sexual Activity  . Alcohol use: No    Alcohol/week: 0.0 standard drinks    Comment: history of alcohol addiction, attends regular AA meetings  . Drug use: No  . Sexual activity: Not on file  Lifestyle  . Physical activity:    Days per week: Not on file    Minutes per session: Not on file  . Stress: Not on file  Relationships  . Social connections:    Talks on phone: Not on file    Gets together: Not on file    Attends religious service: Not on file    Active member of club or organization: Not on file    Attends meetings of clubs or organizations: Not on file    Relationship status: Not on file  . Intimate partner violence:    Fear of current or ex partner: Not on file    Emotionally abused: Not on file    Physically abused: Not on file    Forced sexual activity: Not on file  Other Topics Concern  . Not on file  Social History Narrative  . Not on file   Family History  Problem Relation Age of Onset  . Stroke Father   . Alcohol abuse Father   . Diabetes type II Father   . Heart disease Father        pacemaker  . Diabetes type II Brother   . Heart disease Brother        pacemaker  . Stroke Mother   . COPD Mother   . Alcohol abuse Brother   . Arthritis Sister   . Glaucoma Paternal Uncle   . Sleep apnea Son   . Kidney disease Neg Hx   . Prostate cancer Neg Hx   . Macular degeneration Neg Hx       VITAL SIGNS BP 133/76   Pulse 73   Temp (!) 97.5 F (36.4 C)   Resp 18   Ht 5\' 9"  (1.753 m)   Wt 190 lb 3.2 oz (86.3 kg)   SpO2 98%   BMI 28.09 kg/m   Outpatient Encounter Medications as of 11/18/2017  Medication Sig  . acetaminophen (TYLENOL) 325 MG tablet Take 975 mg by mouth every 8 (eight) hours as needed.  [name] Kitchen alendronate (FOSAMAX) 70 MG tablet Take 70 mg by mouth once a week. On Thursday  . alum & mag hydroxide-simeth (MAALOX PLUS) 400-400-40 MG/5ML suspension Take 15 mLs by mouth 3 (three) times daily  with meals. Also take 15 mls by mouth 3 times daily with meals for Indigestion/ Reflux/ Gas  . ammonium lactate (LAC-HYDRIN) 12 % lotion Apply 1 application topically 2 (two) times  daily.   . calcium carbonate (OSCAL) 1500 (600 Ca) MG TABS tablet Take 600 mg of elemental calcium by mouth daily.  [name] Kitchen docusate sodium (COLACE) 100 MG capsule Take 100 mg by mouth daily as needed.   . DULoxetine (CYMBALTA) 30 MG capsule Take 30 mg by mouth at bedtime.  . gabapentin (NEURONTIN) 800 MG tablet Take 800 mg by mouth 3 (three) times daily.  . Infant Care Products [facility] EX) Apply liberal amount topically  to area of skin irritation prn. OK to leave at bedside.  . levETIRAcetam (KEPPRA) 750 MG tablet Take 1,500 mg by mouth at bedtime. Monitor for sedation  . levETIRAcetam (KEPPRA) 750 MG tablet Take 750 mg by mouth daily. Monitor for sedation  . Menthol, Topical Analgesic, (BIOFREEZE) 4 % GEL Apply topically topically to  bilateral shoulders twice daily for joint pain  . mirabegron ER (MYRBETRIQ) 50 MG TB24 tablet Take 1 tablet (50 mg total) by mouth daily.  . naloxegol oxalate (MOVANTIK) 12.5 MG TABS tablet Take 12.5 mg by mouth every morning.  [name] Kitchen omeprazole (PRILOSEC) 20 MG capsule Take 20 mg by mouth daily.  . Ostomy Supplies (SECURI-T NO STING WIPE) MISC Apply topically to  left foot 2nd and 3rd toes apply skin prep on dressing to affected areas twice daily. Allow to dry before donning socks  . polyethylene glycol (MIRALAX / GLYCOLAX) packet Take 17 g by mouth daily as needed.   [name] Kitchen Propylene Glycol-Glycerin (ARTIFICIAL TEARS) 1-0.3 % SOLN Place 1 drop into both eyes 2 (two) times daily as needed.  . ranitidine (ZANTAC) 150 MG tablet Take 150 mg by mouth 2 (two) times daily.  . rivaroxaban (XARELTO) 20 MG TABS tablet Take 20 mg by mouth daily with supper.  . sennosides-docusate sodium (SENOKOT-S) 8.6-50 MG tablet Take 2 tablets by mouth 2 (two) times daily as needed for constipation.   . Skin  Protectants, Misc. (EUCERIN) cream Wash feet with soap and water, dry. Apply Eucerin cream to dry, scaly skin daily, prn  . tamsulosin (FLOMAX) 0.4 MG CAPS capsule Take 0.4 mg by mouth daily.  [name] Kitchen UNABLE TO FIND Regular diet. Aspiration precautions  . vitamin C (ASCORBIC ACID) 500 MG tablet Take 500 mg by mouth 2 (two) times daily.   No facility-administered encounter medications on file as of 11/18/2017.      SIGNIFICANT DIAGNOSTIC EXAMS  PREVIOUS:   03-18-17: wbc 4.7; hgb 12.7; hct 38.2; mcv 93.3; plt 179; glucose 101; bun 23; creat 0.67; k+ 4.1; na++ 137; ca 8.9 liver normal albumin 3.4  Vit B 12: 758; vit D 34.1;  tsh 1.844;  09-17-17: wbc 3.2; hgb 11.6; hct 34.1; mcv 91.;5 plt 133  10-04-17: urine culture: no growth   TODAY:   10-15-17: glucose 103; bun 24; creat 0.91; k+ 4.2 na++ 140; liver normal albumin 3.6 tsh 2.870; vit B 12: 440; vit D 36.0; keppra 25.8   Review of Systems  Constitutional: Negative for malaise/fatigue.  Respiratory: Negative for cough and shortness of breath.   Cardiovascular: Negative for chest pain, palpitations and leg swelling.  Gastrointestinal: Negative for abdominal pain, constipation and heartburn.  Musculoskeletal: Negative for back pain, joint pain and myalgias.  Skin: Negative.   Neurological: Negative for dizziness.  Psychiatric/Behavioral: The patient is not nervous/anxious.      Physical Exam  Constitutional: He is oriented to person, place, and time. He appears well-developed and well-nourished. No distress.  Neck: No thyromegaly present.  Cardiovascular: Normal rate, regular rhythm, normal heart sounds and intact  distal pulses.  Pulmonary/Chest: Effort normal and breath sounds normal. No respiratory distress.  Abdominal: Soft. Bowel sounds are normal. He exhibits no distension. There is no tenderness.  Musculoskeletal: He exhibits no edema.  Is able to move all extremities  Has scoliosis and kyphoscoliosis    Lymphadenopathy:    He has no  cervical adenopathy.  Neurological: He is alert and oriented to person, place, and time.  Skin: Skin is warm and dry. He is not diaphoretic.  Psychiatric: He has a normal mood and affect.     ASSESSMENT/ PLAN:  TODAY:   1. Seizure: no reports of seizure activity present: will continue keppra 750 mg in the AM and 1500 mg in the PM   2. Constipation due to opioid therapy: is stable will continue movantic 12.5 mg daily has prn colace; miralax and senna s  3.  DDD lumbosacral/degenerative arthritis of lumbar spine/ chronic pain syndrome: has pain pump: is stable will continue neuronton 800 mg three times daily biofreeze to shoulders twice daily   4. Osteoporosis: is stable will continue fosamax 70 mg weekly is taking calcium supplements  PREVIOUS   5. Depression with anxiety: is emotionally stable will continue cymbalta 30 mg daily   6. gerd without esophagitis: is stable will continue zantac 150 mg twice daily and prilosec 20 mg daily   7. Cerebellar stroke: is neurologically stable will continue xarelto 20 mg daily   8. BPH with obstruction/lower urinary tract symptoms: is stable  will continue flomax 0.4 mg daily and myrbetriq 50 mg daily    Will check cbc; cmp; hgb a1c lipids; urine micro albumin    MD is aware of resident's narcotic use and is in agreement with current plan of care. We will attempt to wean resident as apropriate   Synthia Innocent NP [facility]  Contact [phone] Monday through Friday 8am- 5pm  After hours call [phone]

## 2017-11-19 ENCOUNTER — Other Ambulatory Visit
Admission: RE | Admit: 2017-11-19 | Discharge: 2017-11-19 | Disposition: A | Discharge status: Home / Self Care | Payer: Medicare Other | Source: Ambulatory Visit | Attending: Adult Health | Admitting: Adult Health

## 2017-11-19 DIAGNOSIS — G40309 Generalized idiopathic epilepsy and epileptic syndromes, not intractable, without status epilepticus: Secondary | ICD-10-CM | POA: Diagnosis present

## 2017-11-19 LAB — LIPID PANEL
Cholesterol: 158 mg/dL (ref 0–200)
HDL: 61 mg/dL (ref >40)
LDL Cholesterol: 88 mg/dL (ref 0–99)
Total CHOL/HDL Ratio: 2.6 RATIO
Triglycerides: 47 mg/dL (ref <150)
VLDL: 9 mg/dL (ref 0–40)

## 2017-11-19 LAB — HEMOGLOBIN A1C
Hgb A1c MFr Bld: 6.1 % — ABNORMAL HIGH (ref 4.8–5.6)
Mean Plasma Glucose: 128.37 mg/dL

## 2017-11-19 LAB — CBC
HCT: 37.8 % — ABNORMAL LOW (ref 39.0–52.0)
Hemoglobin: 12.3 g/dL — ABNORMAL LOW (ref 13.0–17.0)
MCH: 30.5 pg (ref 26.0–34.0)
MCHC: 32.5 g/dL (ref 30.0–36.0)
MCV: 93.8 fL (ref 80.0–100.0)
PLATELETS: 135 10*3/uL — AB (ref 150–400)
RBC: 4.03 MIL/uL — AB (ref 4.22–5.81)
RDW: 13.4 % (ref 11.5–15.5)
WBC: 3.7 10*3/uL — ABNORMAL LOW (ref 4.0–10.5)
nRBC: 0 % (ref 0.0–0.2)

## 2017-11-19 LAB — COMPREHENSIVE METABOLIC PANEL
ALBUMIN: 3.6 g/dL (ref 3.5–5.0)
ALK PHOS: 84 U/L (ref 38–126)
ALT: 20 U/L (ref 0–44)
AST: 21 U/L (ref 15–41)
Anion gap: 7 (ref 5–15)
BILIRUBIN TOTAL: 1 mg/dL (ref 0.3–1.2)
BUN: 24 mg/dL — AB (ref 8–23)
CALCIUM: 9 mg/dL (ref 8.9–10.3)
CO2: 32 mmol/L (ref 22–32)
Chloride: 101 mmol/L (ref 98–111)
Creatinine, Ser: 0.91 mg/dL (ref 0.61–1.24)
GFR calc Af Amer: >60 mL/min (ref >60)
GFR calc non Af Amer: >60 mL/min (ref >60)
GLUCOSE: 105 mg/dL — AB (ref 70–99)
Potassium: 4.2 mmol/L (ref 3.5–5.1)
SODIUM: 140 mmol/L (ref 135–145)
Total Protein: 6.3 g/dL — ABNORMAL LOW (ref 6.5–8.1)

## 2017-11-20 LAB — MICROALBUMIN, URINE

## 2017-11-24 IMAGING — DX DG KNEE COMPLETE 4+V*R*
5 series · 5 of 5 positions shown · non-contrast
Comparison: None.

CLINICAL DATA: Pain

EXAM:
RIGHT KNEE - COMPLETE 4+ VIEW

[knee ap]
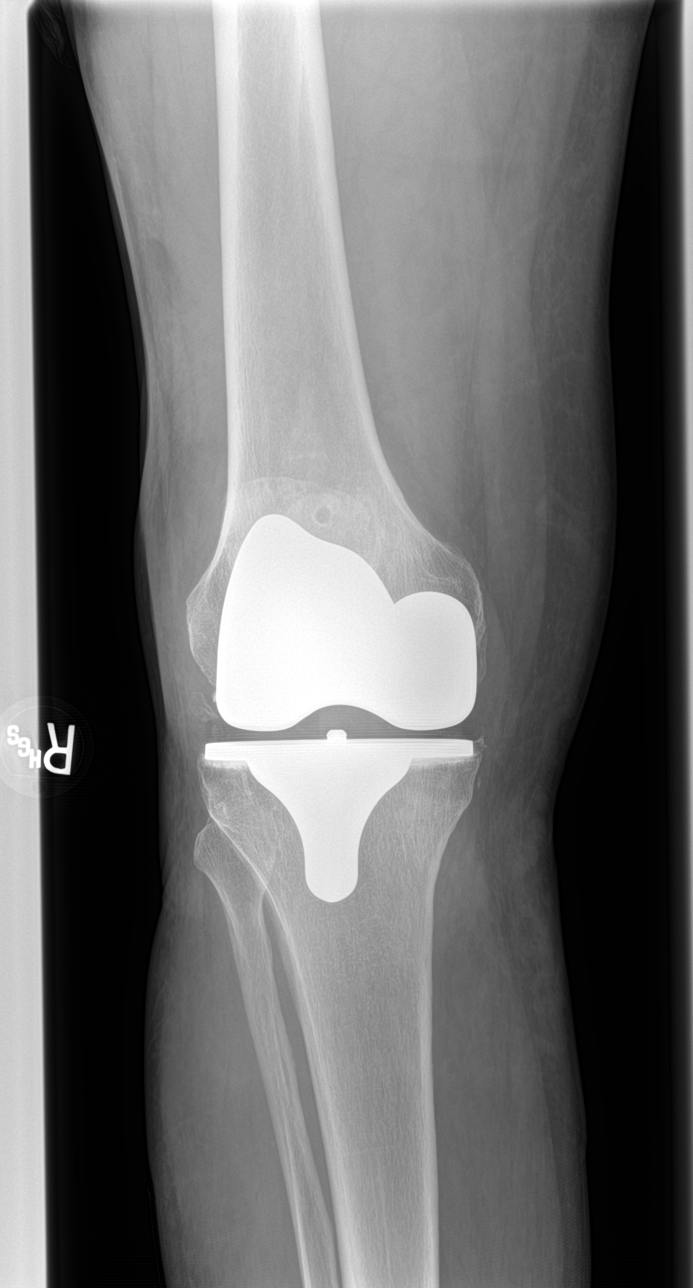

[knee lat]
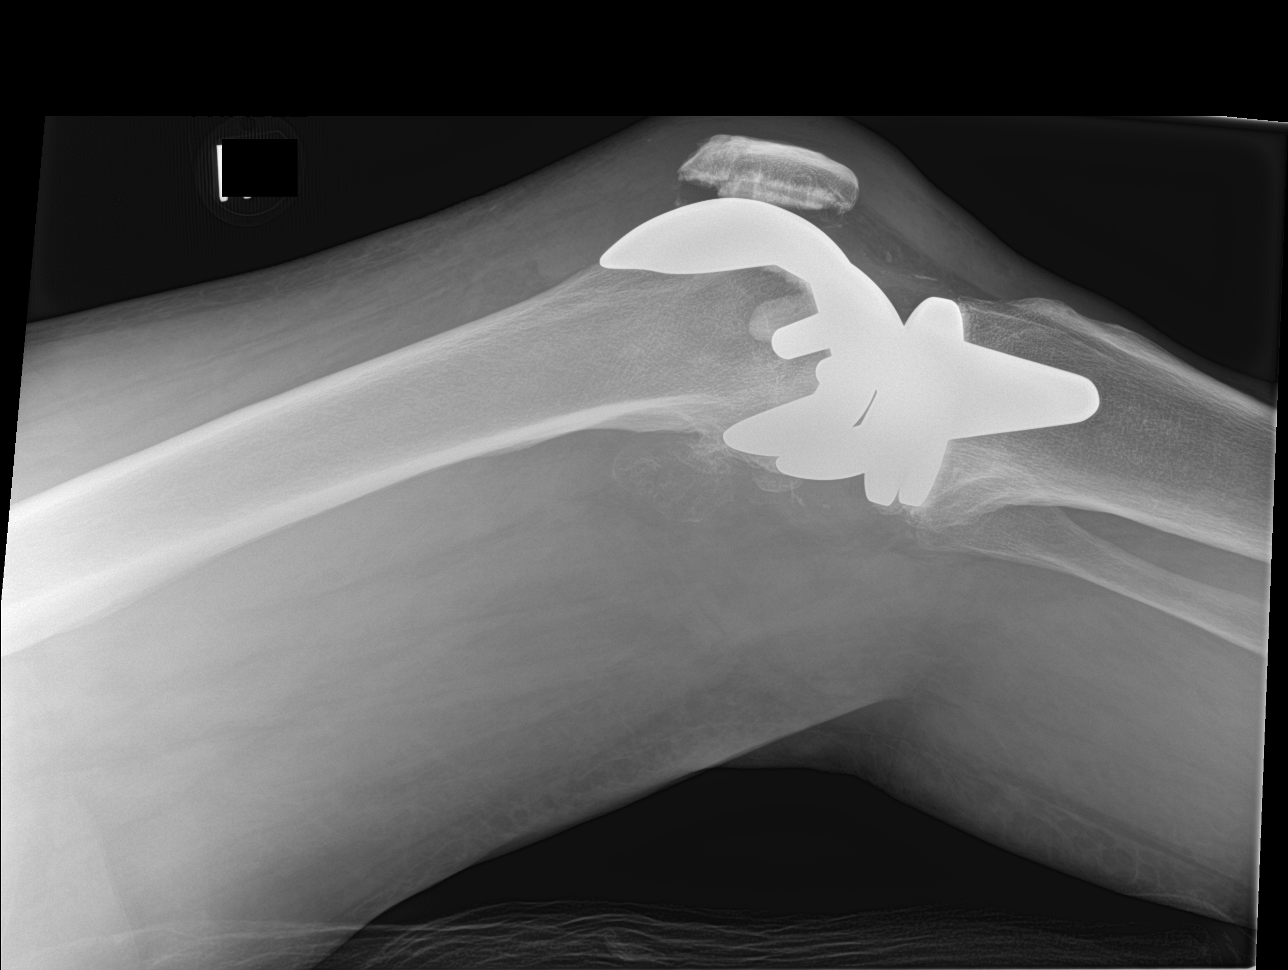

[patella skyline]
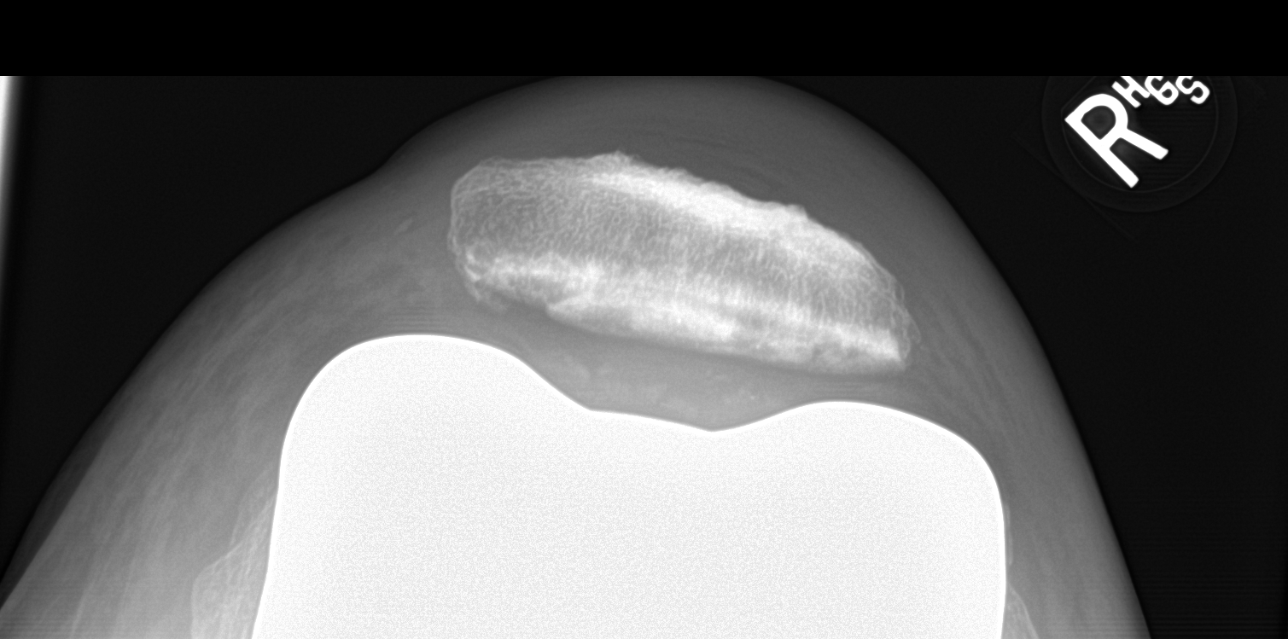

[knee obl (1 of 2)]
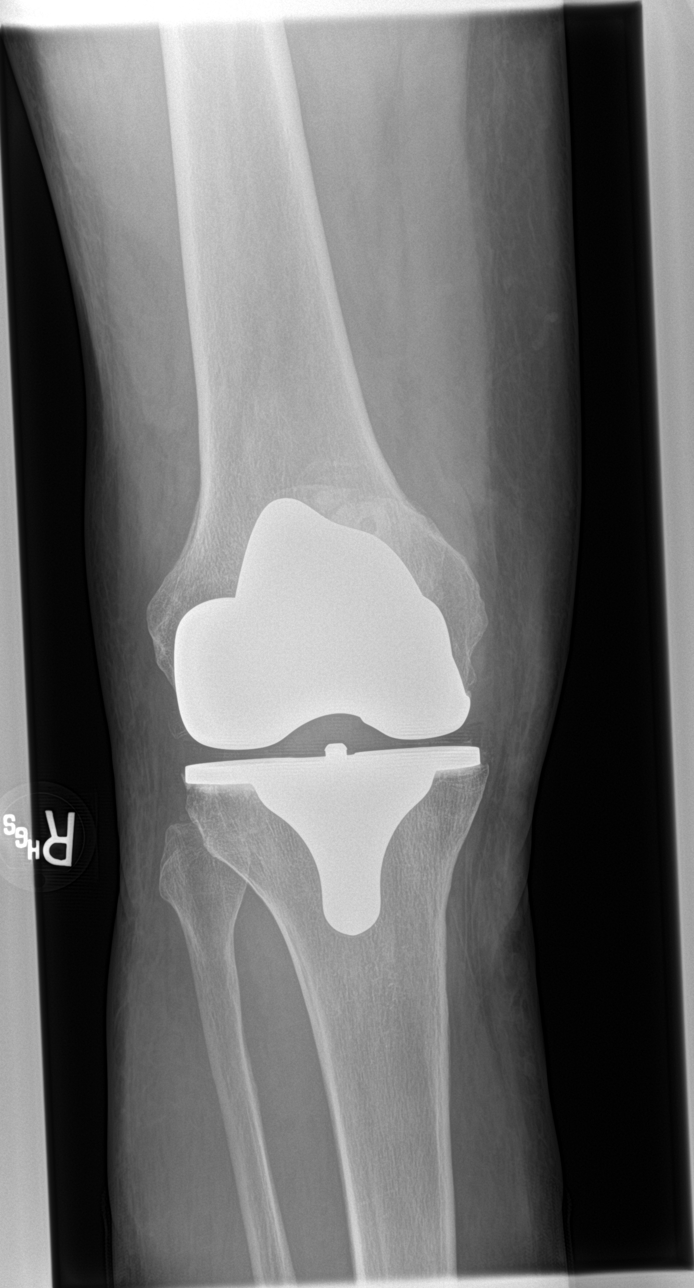

[knee obl (2 of 2)]
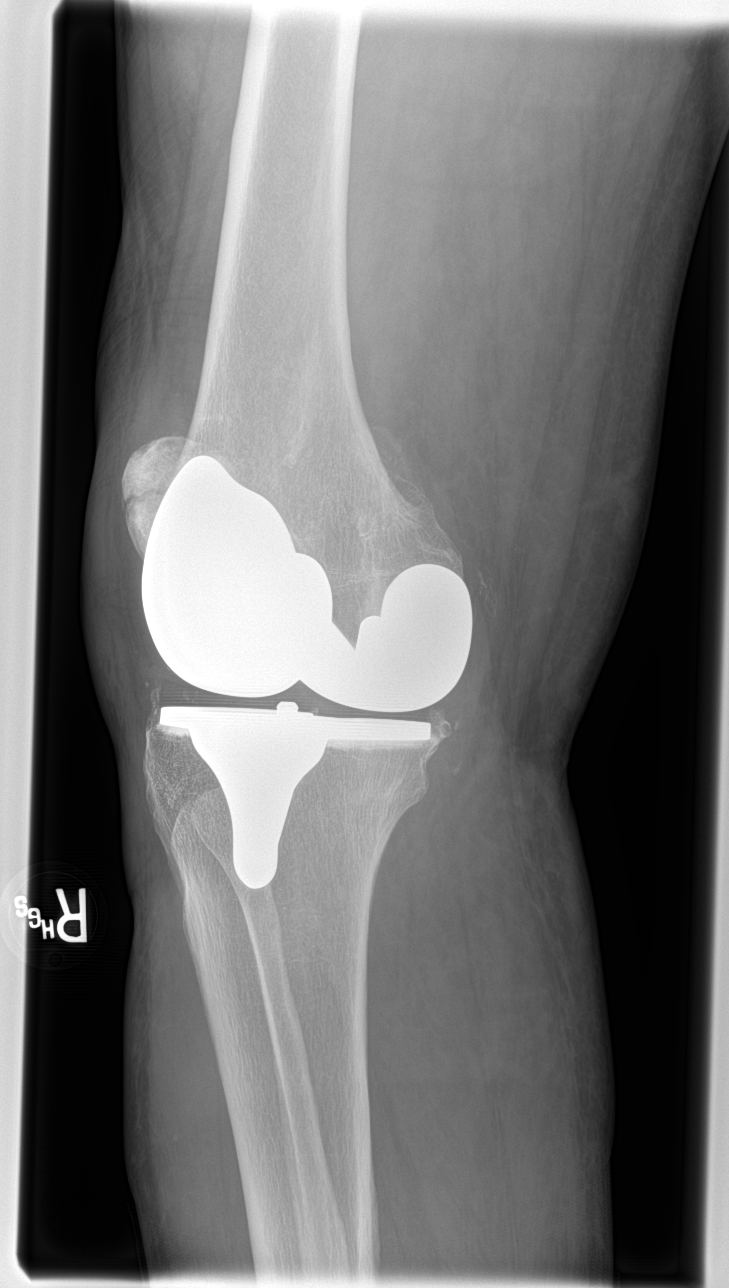

[5 of 5 positions shown; findings below may reference images not displayed]

FINDINGS: Frontal, lateral, bilateral oblique, and sunrise patellar images
were obtained. The patient has had total knee replacement with
prosthetic components appearing well-seated. There is evidence of a
fracture of the junction of the proximal and mid thirds of the
patella, best appreciated on an oblique view. Alignment is
essentially anatomic in this area. No other fracture evident. No
dislocation. There is a small joint effusion.
IMPRESSION: Age uncertain patellar fracture with alignment anatomic. Small joint
effusion. Status post total knee replacement. No dislocation.

## 2017-12-02 ENCOUNTER — Encounter: Payer: Self-pay | Admitting: Adult Health

## 2017-12-02 NOTE — Progress Notes (Signed)
Location:   [facility] Room Number: 325 A Place of Service:  SNF (31)   CODE STATUS: DNR  Allergies  Allergen Reactions  . Amitriptyline Rash  . Nortriptyline Other (See Comments) and Rash    GI  upset    Chief Complaint  Patient presents with  . Acute Visit    Toenails    HPI:    Past Medical History:  Diagnosis Date  . Abdominal pain    other specified site  . Abnormal gait   . Advance directive in chart 10/12/2015  . Anemia   . Arthritis   . BPH (benign prostatic hyperplasia)   . Cataract   . Cervical spondylosis without myelopathy   . Cognitive dysfunction   . DDD (degenerative disc disease), cervical 06/29/2013  . Degeneration of cervical intervertebral disc   . Degeneration of lumbar intervertebral disc   . Depression   . Excessive daytime sleepiness   . Generalized osteoarthritis of multiple sites 11/19/2013  . GERD (gastroesophageal reflux disease)   . Hallux varus, acquired   . Hiatal hernia   . Hyperlipidemia   . Hypertension   . Hypogonadism in male   . Impaired functional mobility, balance, gait, and endurance 11/14/2015   uses motorized wheelchair  . Insomnia 06/29/2013  . Low back pain   . Lumbosacral spondylosis without myelopathy   . Memory loss   . Mild cognitive impairment   . Neurogenic bladder   . OP (osteoporosis)   . Organic sleep apnea   . Osteoporosis 03/15/2004  . Peripheral neuropathy   . Radial nerve palsy   . Scoliosis 06/29/2013  . Seizure (HCC) 07/31/2016  . Sleep apnea    CPAP to bring DOS sleep study done in [place]  . Spinal compression fracture (HCC)   . Spinal stenosis   . Spondylolisthesis, acquired   . Urinary incontinence, stress, male 08/10/2015  . Urinary urgency   . Uses hearing aid 10/12/2015  . Venous stasis   . Venous stasis dermatitis of both lower extremities 06/29/2013  . Vision abnormalities   . Vitamin B12 deficiency 09/27/2016  . Wrist drop, bilateral      Past Surgical History:  Procedure Laterality Date  . CARPAL TUNNEL RELEASE Left 03/16/2016   Dr. Kennedy Bucker  . CERVICAL SPINE SURGERY    . CORNEAL EYE SURGERY Right 09/08/2014   PTK (Dr. Chales Abrahams)  . HEMORRHOID SURGERY    . HERNIA REPAIR     left side  . INTRATHECAL PUMP IMPLANTATION  04/28/2014   Procedure: REVISION INTRATHECAL CATH; Surgeon: Burman Blacksmith, MD; Location: [facility] OR; Service: Anes/ Pain Mgmt; Laterality: N/A;  . JOINT REPLACEMENT Bilateral    bilateral knee replacements  . LUMBAR SPINE SURGERY    . PAIN PUMP IMPLANTATION     multiple procedures for removal and insertion this is his 3rd pain pump  . PAIN PUMP REMOVAL     multiple procedures for removal and insertion this is his 3rd pain pump  . Proleive System Treatment  2006  . ROTATOR CUFF REPAIR Right    x2  . SPINAL CORD STIMULATOR IMPLANT     x2  . THORACIC SPINE SURGERY    . TONSILLECTOMY      Social History   Socioeconomic History  . Marital status: Married    Spouse name: Not on file  . Number of children: Not on file  . Years of education: Not on file  . Highest education level: Not  on file  Occupational History  . Not on file  Social Needs  . Financial resource strain: Not on file  . Food insecurity:    Worry: Not on file    Inability: Not on file  . Transportation needs:    Medical: Not on file    Non-medical: Not on file  Tobacco Use  . Smoking status: Never Smoker  . Smokeless tobacco: Never Used  Substance and Sexual Activity  . Alcohol use: No    Alcohol/week: 0.0 standard drinks    Comment: history of alcohol addiction, attends regular AA meetings  . Drug use: No  . Sexual activity: Not on file  Lifestyle  . Physical activity:    Days per week: Not on file    Minutes per session: Not on file  . Stress: Not on file  Relationships  . Social connections:    Talks on phone: Not on file    Gets together: Not on file    Attends religious service: Not on file     Active member of club or organization: Not on file    Attends meetings of clubs or organizations: Not on file    Relationship status: Not on file  . Intimate partner violence:    Fear of current or ex partner: Not on file    Emotionally abused: Not on file    Physically abused: Not on file    Forced sexual activity: Not on file  Other Topics Concern  . Not on file  Social History Narrative  . Not on file   Family History  Problem Relation Age of Onset  . Stroke Father   . Alcohol abuse Father   . Diabetes type II Father   . Heart disease Father        pacemaker  . Diabetes type II Brother   . Heart disease Brother        pacemaker  . Stroke Mother   . COPD Mother   . Alcohol abuse Brother   . Arthritis Sister   . Glaucoma Paternal Uncle   . Sleep apnea Son   . Kidney disease Neg Hx   . Prostate cancer Neg Hx   . Macular degeneration Neg Hx       VITAL SIGNS BP 122/69   Pulse 61   Temp 97.6 F (36.4 C)   Resp 18   Ht 5\' 9"  (1.753 m)   Wt 190 lb 0.3 oz (86.2 kg)   SpO2 98%   BMI 28.06 kg/m   Outpatient Encounter Medications as of 12/02/2017  Medication Sig  . acetaminophen (TYLENOL) 325 MG tablet Take 975 mg by mouth every 8 (eight) hours as needed.  [name] Kitchen. alendronate (FOSAMAX) 70 MG tablet Take 70 mg by mouth once a week. On Thursday  . alum & mag hydroxide-simeth (MAALOX PLUS) 400-400-40 MG/5ML suspension Take 15 mLs by mouth 3 (three) times daily with meals. Also take 15 mls by mouth 3 times daily with meals for Indigestion/ Reflux/ Gas  . ammonium lactate (LAC-HYDRIN) 12 % lotion Apply 1 application topically 2 (two) times daily.   . calcium carbonate (OSCAL) 1500 (600 Ca) MG TABS tablet Take 600 mg of elemental calcium by mouth daily.  [name] Kitchen. docusate sodium (COLACE) 100 MG capsule Take 100 mg by mouth daily as needed.   . DULoxetine (CYMBALTA) 30 MG capsule Take 30 mg by mouth at bedtime.  . gabapentin (NEURONTIN) 800 MG tablet Take 800 mg by mouth 3 (three) times  daily.  .[name] Kitchen  Infant Care Products [facility] EX) Apply liberal amount topically  to area of skin irritation prn. OK to leave at bedside.  . levETIRAcetam (KEPPRA) 750 MG tablet Take 1,500 mg by mouth at bedtime. Monitor for sedation  . levETIRAcetam (KEPPRA) 750 MG tablet Take 750 mg by mouth daily. Monitor for sedation  . Menthol, Topical Analgesic, (BIOFREEZE) 4 % GEL Apply topically topically to  bilateral shoulders twice daily for joint pain  . mirabegron ER (MYRBETRIQ) 50 MG TB24 tablet Take 1 tablet (50 mg total) by mouth daily.  . naloxegol oxalate (MOVANTIK) 12.5 MG TABS tablet Take 12.5 mg by mouth every morning.  [name] Kitchen omeprazole (PRILOSEC) 20 MG capsule Take 20 mg by mouth daily.  . Ostomy Supplies (SECURI-T NO STING WIPE) MISC Apply topically to  left foot 2nd and 3rd toes apply skin prep on dressing to affected areas twice daily. Allow to dry before donning socks  . polyethylene glycol (MIRALAX / GLYCOLAX) packet Take 17 g by mouth daily as needed.   [name] Kitchen Propylene Glycol-Glycerin (ARTIFICIAL TEARS) 1-0.3 % SOLN Place 1 drop into both eyes 2 (two) times daily as needed.  . ranitidine (ZANTAC) 150 MG tablet Take 150 mg by mouth 2 (two) times daily.  . rivaroxaban (XARELTO) 20 MG TABS tablet Take 20 mg by mouth daily with supper.  . sennosides-docusate sodium (SENOKOT-S) 8.6-50 MG tablet Take 2 tablets by mouth 2 (two) times daily as needed for constipation.   . Skin Protectants, Misc. (EUCERIN) cream Wash feet with soap and water, dry. Apply Eucerin cream to dry, scaly skin daily, prn  . tamsulosin (FLOMAX) 0.4 MG CAPS capsule Take 0.4 mg by mouth daily.  [name] Kitchen UNABLE TO FIND Regular diet. Aspiration precautions  . vitamin C (ASCORBIC ACID) 500 MG tablet Take 500 mg by mouth 2 (two) times daily.   No facility-administered encounter medications on file as of 12/02/2017.      SIGNIFICANT DIAGNOSTIC EXAMS  PREVIOUS:   03-18-17: wbc 4.7; hgb 12.7; hct 38.2; mcv 93.3; plt 179; glucose 101; bun  23; creat 0.67; k+ 4.1; na++ 137; ca 8.9 liver normal albumin 3.4  Vit B 12: 758; vit D 34.1;  tsh 1.844;  09-17-17: wbc 3.2; hgb 11.6; hct 34.1; mcv 91.;5 plt 133  10-04-17: urine culture: no growth   TODAY:   10-15-17: glucose 103; bun 24; creat 0.91; k+ 4.2 na++ 140; liver normal albumin 3.6 tsh 2.870; vit B 12: 440; vit D 36.0; keppra 25.8   Review of Systems  Constitutional: Negative for malaise/fatigue.  Respiratory: Negative for cough and shortness of breath.   Cardiovascular: Negative for chest pain, palpitations and leg swelling.  Gastrointestinal: Negative for abdominal pain, constipation and heartburn.  Musculoskeletal: Negative for back pain, joint pain and myalgias.  Skin: Negative.   Neurological: Negative for dizziness.  Psychiatric/Behavioral: The patient is not nervous/anxious.      Physical Exam  Constitutional: He is oriented to person, place, and time. He appears well-developed and well-nourished. No distress.  Neck: No thyromegaly present.  Cardiovascular: Normal rate, regular rhythm, normal heart sounds and intact distal pulses.  Pulmonary/Chest: Effort normal and breath sounds normal. No respiratory distress.  Abdominal: Soft. Bowel sounds are normal. He exhibits no distension. There is no tenderness.  Musculoskeletal: He exhibits no edema.  Is able to move all extremities  Has scoliosis and kyphoscoliosis    Lymphadenopathy:    He has no cervical adenopathy.  Neurological: He is alert and oriented to person, place, and time.  Skin: Skin is warm and dry. He is not diaphoretic.  Psychiatric: He has a normal mood and affect.     ASSESSMENT/ PLAN:  TODAY:   1. Seizure: no reports of seizure activity present: will continue keppra 750 mg in the AM and 1500 mg in the PM   2. Constipation due to opioid therapy: is stable will continue movantic 12.5 mg daily has prn colace; miralax and senna s  3.  DDD lumbosacral/degenerative arthritis of lumbar spine/ chronic  pain syndrome: has pain pump: is stable will continue neuronton 800 mg three times daily biofreeze to shoulders twice daily   4. Osteoporosis: is stable will continue fosamax 70 mg weekly is taking calcium supplements  PREVIOUS   5. Depression with anxiety: is emotionally stable will continue cymbalta 30 mg daily   6. gerd without esophagitis: is stable will continue zantac 150 mg twice daily and prilosec 20 mg daily   7. Cerebellar stroke: is neurologically stable will continue xarelto 20 mg daily   8. BPH with obstruction/lower urinary tract symptoms: is stable  will continue flomax 0.4 mg daily and myrbetriq 50 mg daily    Will check cbc; cmp; hgb a1c lipids; urine micro albumin   MD is aware of resident's narcotic use and is in agreement with current plan of care. We will attempt to wean resident as apropriate   Synthia Innocent NP [facility]  Contact [phone] Monday through Friday 8am- 5pm  After hours call [phone]

## 2017-12-04 ENCOUNTER — Encounter: Payer: Self-pay | Admitting: Adult Health

## 2017-12-04 NOTE — Progress Notes (Signed)
Entered in error

## 2017-12-07 NOTE — Progress Notes (Signed)
This encounter was created in error - please disregard.

## 2017-12-15 ENCOUNTER — Encounter
Admission: RE | Admit: 2017-12-15 | Discharge: 2017-12-15 | Disposition: A | Discharge status: Home / Self Care | Payer: Medicare Other | Source: Ambulatory Visit | Attending: Internal Medicine | Admitting: Internal Medicine

## 2017-12-17 ENCOUNTER — Non-Acute Institutional Stay (SKILLED_NURSING_FACILITY): Payer: Medicare Other | Admitting: Adult Health

## 2017-12-17 DIAGNOSIS — K219 Gastro-esophageal reflux disease without esophagitis: Secondary | ICD-10-CM | POA: Diagnosis not present

## 2017-12-17 DIAGNOSIS — F418 Other specified anxiety disorders: Secondary | ICD-10-CM | POA: Diagnosis not present

## 2017-12-17 DIAGNOSIS — N401 Enlarged prostate with lower urinary tract symptoms: Secondary | ICD-10-CM | POA: Diagnosis not present

## 2017-12-17 DIAGNOSIS — I639 Cerebral infarction, unspecified: Secondary | ICD-10-CM

## 2017-12-17 DIAGNOSIS — N138 Other obstructive and reflux uropathy: Secondary | ICD-10-CM

## 2017-12-17 NOTE — Progress Notes (Signed)
Location:    [facility] Room Number: 325 Place of Service:  SNF (31)   CODE STATUS: dnr  Allergies  Allergen Reactions  . Amitriptyline Rash  . Nortriptyline Other (See Comments) and Rash    GI  upset    Chief Complaint  Patient presents with  . Medical Management of Chronic Issues    Cerebellar stroke; gerd without esophagitis; BPH with obstruction/lower urinary tract symptoms; depression with anxiety.      HPI:  He is a 82 year old long term resident of this facility being seen for the management of his chronic illnesses: stroke; gerd; bph; depression with anxiety. He denies any difficulty with urination or frequency. He denies any heart burn; no reports of uncontrolled pain.   Past Medical History:  Diagnosis Date  . Abdominal pain    other specified site  . Abnormal gait   . Advance directive in chart 10/12/2015  . Anemia   . Arthritis   . BPH (benign prostatic hyperplasia)   . Cataract   . Cervical spondylosis without myelopathy   . Cognitive dysfunction   . DDD (degenerative disc disease), cervical 06/29/2013  . Degeneration of cervical intervertebral disc   . Degeneration of lumbar intervertebral disc   . Depression   . Excessive daytime sleepiness   . Generalized osteoarthritis of multiple sites 11/19/2013  . GERD (gastroesophageal reflux disease)   . Hallux varus, acquired   . Hiatal hernia   . Hyperlipidemia   . Hypertension   . Hypogonadism in male   . Impaired functional mobility, balance, gait, and endurance 11/14/2015   uses motorized wheelchair  . Insomnia 06/29/2013  . Low back pain   . Lumbosacral spondylosis without myelopathy   . Memory loss   . Mild cognitive impairment   . Neurogenic bladder   . OP (osteoporosis)   . Organic sleep apnea   . Osteoporosis 03/15/2004  . Peripheral neuropathy   . Radial nerve palsy   . Scoliosis 06/29/2013  . Seizure (HCC) 07/31/2016  . Sleep apnea    CPAP to bring DOS sleep study done  in [place]  . Spinal compression fracture (HCC)   . Spinal stenosis   . Spondylolisthesis, acquired   . Urinary incontinence, stress, male 08/10/2015  . Urinary urgency   . Uses hearing aid 10/12/2015  . Venous stasis   . Venous stasis dermatitis of both lower extremities 06/29/2013  . Vision abnormalities   . Vitamin B12 deficiency 09/27/2016  . Wrist drop, bilateral     Past Surgical History:  Procedure Laterality Date  . CARPAL TUNNEL RELEASE Left 03/16/2016   Dr. Kennedy BuckerMichael Menz  . CERVICAL SPINE SURGERY    . CORNEAL EYE SURGERY Right 09/08/2014   PTK (Dr. Chales AbrahamsGupta)  . HEMORRHOID SURGERY    . HERNIA REPAIR     left side  . INTRATHECAL PUMP IMPLANTATION  04/28/2014   Procedure: REVISION INTRATHECAL CATH; Surgeon: Burman Blacksmithichard Levi Boortz-Marx, MD; Location: [facility] OR; Service: Anes/ Pain Mgmt; Laterality: N/A;  . JOINT REPLACEMENT Bilateral    bilateral knee replacements  . LUMBAR SPINE SURGERY    . PAIN PUMP IMPLANTATION     multiple procedures for removal and insertion this is his 3rd pain pump  . PAIN PUMP REMOVAL     multiple procedures for removal and insertion this is his 3rd pain pump  . Proleive System Treatment  2006  . ROTATOR CUFF REPAIR Right    x2  . SPINAL CORD STIMULATOR  IMPLANT     x2  . THORACIC SPINE SURGERY    . TONSILLECTOMY      Social History   Socioeconomic History  . Marital status: Married    Spouse name: Not on file  . Number of children: Not on file  . Years of education: Not on file  . Highest education level: Not on file  Occupational History  . Not on file  Social Needs  . Financial resource strain: Not on file  . Food insecurity:    Worry: Not on file    Inability: Not on file  . Transportation needs:    Medical: Not on file    Non-medical: Not on file  Tobacco Use  . Smoking status: Never Smoker  . Smokeless tobacco: Never Used  Substance and Sexual Activity  . Alcohol use: No    Alcohol/week: 0.0 standard drinks    Comment:  history of alcohol addiction, attends regular AA meetings  . Drug use: No  . Sexual activity: Not on file  Lifestyle  . Physical activity:    Days per week: Not on file    Minutes per session: Not on file  . Stress: Not on file  Relationships  . Social connections:    Talks on phone: Not on file    Gets together: Not on file    Attends religious service: Not on file    Active member of club or organization: Not on file    Attends meetings of clubs or organizations: Not on file    Relationship status: Not on file  . Intimate partner violence:    Fear of current or ex partner: Not on file    Emotionally abused: Not on file    Physically abused: Not on file    Forced sexual activity: Not on file  Other Topics Concern  . Not on file  Social History Narrative  . Not on file   Family History  Problem Relation Age of Onset  . Stroke Father   . Alcohol abuse Father   . Diabetes type II Father   . Heart disease Father        pacemaker  . Diabetes type II Brother   . Heart disease Brother        pacemaker  . Stroke Mother   . COPD Mother   . Alcohol abuse Brother   . Arthritis Sister   . Glaucoma Paternal Uncle   . Sleep apnea Son   . Kidney disease Neg Hx   . Prostate cancer Neg Hx   . Macular degeneration Neg Hx       VITAL SIGNS BP 103/80   Pulse 70   Temp 98.2 F (36.8 C)   Resp 18   Ht 5\' 9"  (1.753 m)   Wt 190 lb 3.2 oz (86.3 kg)   SpO2 97%   BMI 28.09 kg/m   Outpatient Encounter Medications as of 12/17/2017  Medication Sig  . acetaminophen (TYLENOL) 325 MG tablet Take 975 mg by mouth every 8 (eight) hours as needed.  [name] Kitchen alendronate (FOSAMAX) 70 MG tablet Take 70 mg by mouth once a week. On Thursday  . alum & mag hydroxide-simeth (MAALOX PLUS) 400-400-40 MG/5ML suspension Take 15 mLs by mouth 3 (three) times daily with meals. Also take 15 mls by mouth 3 times daily with meals for Indigestion/ Reflux/ Gas  . ammonium lactate (LAC-HYDRIN) 12 % lotion Apply 1  application topically 2 (two) times daily.   . calcium carbonate (OSCAL)  1500 (600 Ca) MG TABS tablet Take 600 mg of elemental calcium by mouth daily.  [name] Kitchen docusate sodium (COLACE) 100 MG capsule Take 100 mg by mouth daily as needed.   . DULoxetine (CYMBALTA) 30 MG capsule Take 30 mg by mouth at bedtime.  . gabapentin (NEURONTIN) 800 MG tablet Take 800 mg by mouth 3 (three) times daily.  . Infant Care Products [facility] EX) Apply liberal amount topically  to area of skin irritation prn. OK to leave at bedside.  . Menthol, Topical Analgesic, (BIOFREEZE) 4 % GEL Apply topically topically to  bilateral shoulders twice daily for joint pain  . mirabegron ER (MYRBETRIQ) 50 MG TB24 tablet Take 1 tablet (50 mg total) by mouth daily.  . naloxegol oxalate (MOVANTIK) 12.5 MG TABS tablet Take 12.5 mg by mouth every morning.  [name] Kitchen omeprazole (PRILOSEC) 20 MG capsule Take 20 mg by mouth daily.  . Ostomy Supplies (SECURI-T NO STING WIPE) MISC Apply topically to  left foot 2nd and 3rd toes apply skin prep on dressing to affected areas twice daily. Allow to dry before donning socks  . polyethylene glycol (MIRALAX / GLYCOLAX) packet Take 17 g by mouth daily as needed.   [name] Kitchen Propylene Glycol-Glycerin (ARTIFICIAL TEARS) 1-0.3 % SOLN Place 1 drop into both eyes 2 (two) times daily as needed.  . ranitidine (ZANTAC) 150 MG tablet Take 150 mg by mouth 2 (two) times daily.  . rivaroxaban (XARELTO) 20 MG TABS tablet Take 20 mg by mouth daily with supper.  . sennosides-docusate sodium (SENOKOT-S) 8.6-50 MG tablet Take 2 tablets by mouth 2 (two) times daily as needed for constipation.   . Skin Protectants, Misc. (EUCERIN) cream Wash feet with soap and water, dry. Apply Eucerin cream to dry, scaly skin daily, prn  . tamsulosin (FLOMAX) 0.4 MG CAPS capsule Take 0.4 mg by mouth daily.  [name] Kitchen UNABLE TO FIND Regular diet. Aspiration precautions  . vitamin C (ASCORBIC ACID) 500 MG tablet Take 500 mg by mouth 2 (two) times daily.  .  [DISCONTINUED] levETIRAcetam (KEPPRA) 750 MG tablet Take 1,500 mg by mouth at bedtime. Monitor for sedation  . [DISCONTINUED] levETIRAcetam (KEPPRA) 750 MG tablet Take 750 mg by mouth daily. Monitor for sedation   No facility-administered encounter medications on file as of 12/17/2017.      SIGNIFICANT DIAGNOSTIC EXAMS   PREVIOUS:   03-18-17: wbc 4.7; hgb 12.7; hct 38.2; mcv 93.3; plt 179; glucose 101; bun 23; creat 0.67; k+ 4.1; na++ 137; ca 8.9 liver normal albumin 3.4  Vit B 12: 758; vit D 34.1;  tsh 1.844;  09-17-17: wbc 3.2; hgb 11.6; hct 34.1; mcv 91.;5 plt 133  10-04-17: urine culture: no growth  10-15-17: glucose 103; bun 24; creat 0.91; k+ 4.2 na++ 140; liver normal albumin 3.6 tsh 2.870; vit B 12: 440; vit D 36.0;  10-21-17: keppra 25.8  TODAY:   11-19-17: wbc 3.7;hgb 12.3; hct 37.8; mcv 93.8; plt 135; glucose 105; bun 24; creat 0.91; k+ 4.2; na++ 140; ca 9.0; liver normal albumin 3.6; hgb a1c 6.1; chol 158; ldl 88; trig 47; hdl 61  Urine for micro-albumin : <3.0    Review of Systems  Constitutional: Negative for malaise/fatigue.  Respiratory: Negative for cough and shortness of breath.   Cardiovascular: Negative for chest pain, palpitations and leg swelling.  Gastrointestinal: Negative for abdominal pain, constipation and heartburn.  Musculoskeletal: Positive for back pain, joint pain and myalgias.       He has chronic pain which is being managed  Skin: Negative.   Neurological: Negative for dizziness.  Psychiatric/Behavioral: The patient is not nervous/anxious.       Physical Exam  Constitutional: He is oriented to person, place, and time. He appears well-developed and well-nourished. No distress.  Neck: No thyromegaly present.  Cardiovascular: Normal rate, regular rhythm, normal heart sounds and intact distal pulses.  Pulmonary/Chest: Effort normal and breath sounds normal. No respiratory distress.  Abdominal: Soft. Bowel sounds are normal. He exhibits no distension.  There is no tenderness.  Musculoskeletal: He exhibits no edema.  Is able to move all extremities Has scoliosis and kyphosis   Lymphadenopathy:    He has no cervical adenopathy.  Neurological: He is alert and oriented to person, place, and time.  Skin: Skin is warm and dry. He is not diaphoretic.  Psychiatric: He has a normal mood and affect.    ASSESSMENT/ PLAN:  TODAY:   1. Depression with anxiety: is emotionally stable will continue cymbalta 30 mg daily   2. gerd without esophagitis: is stable will continue zantac 150 mg twice daily and prilosec 20 mg daily   3. Cerebellar stroke: is neurologically stable will continue xarelto 20 mg daily   4. BPH with obstruction/lower urinary tract symptoms: is stable  will continue flomax 0.4 mg daily and myrbetriq 50 mg daily    PREVIOUS    5. Seizure: no reports of seizure activity present: his keppra was stopped on 12-05-17; will monitor   6. Constipation due to opioid therapy: is stable will continue movantic 12.5 mg daily has prn colace miralax and senna s 2 tabs twice daily as needed   7.  DDD lumbosacral/degenerative arthritis of lumbar spine/ chronic pain syndrome: has pain pump: is stable will continue neuronton 800 mg three times daily biofreeze to shoulders twice daily   8. Osteoporosis: is stable will continue fosamax 70 mg weekly is taking calcium supplements     MD is aware of resident's narcotic use and is in agreement with current plan of care. We will attempt to wean resident as apropriate   Synthia Innocent NP [facility] Medicine  Contact [phone] Monday through Friday 8am- 5pm  After hours call [phone]

## 2017-12-18 ENCOUNTER — Encounter: Payer: Self-pay | Admitting: Adult Health

## 2017-12-18 ENCOUNTER — Non-Acute Institutional Stay (SKILLED_NURSING_FACILITY): Payer: Medicare Other | Admitting: Adult Health

## 2017-12-18 DIAGNOSIS — M47816 Spondylosis without myelopathy or radiculopathy, lumbar region: Secondary | ICD-10-CM

## 2017-12-18 DIAGNOSIS — M47812 Spondylosis without myelopathy or radiculopathy, cervical region: Secondary | ICD-10-CM

## 2017-12-18 DIAGNOSIS — G894 Chronic pain syndrome: Secondary | ICD-10-CM | POA: Diagnosis not present

## 2017-12-18 NOTE — Progress Notes (Signed)
Location:   [facility] Room Number: 325 A Place of Service:  SNF (31)   CODE STATUS: DNR  Allergies  Allergen Reactions  . Amitriptyline Rash  . Nortriptyline Other (See Comments) and Rash    GI  upset    Chief Complaint  Patient presents with  . Acute Visit    Care Plan Meeting    HPI:  We have come together for his routine care plan meeting. He and his family are present. He has been deemed not safe to have his motorized scooter. He is not happy about this. He has been slowly gaining weight over time and is up 30 pounds since his admission. He does have chronic pain; which is being adequately managed. He denies that the pain is interfering with his life. He denies any difficulty with anxiety or insomnia. His keppra was stopped on 12-05-17.    Past Medical History:  Diagnosis Date  . Abdominal pain    other specified site  . Abnormal gait   . Advance directive in chart 10/12/2015  . Anemia   . Arthritis   . BPH (benign prostatic hyperplasia)   . Cataract   . Cervical spondylosis without myelopathy   . Cognitive dysfunction   . DDD (degenerative disc disease), cervical 06/29/2013  . Degeneration of cervical intervertebral disc   . Degeneration of lumbar intervertebral disc   . Depression   . Excessive daytime sleepiness   . Generalized osteoarthritis of multiple sites 11/19/2013  . GERD (gastroesophageal reflux disease)   . Hallux varus, acquired   . Hiatal hernia   . Hyperlipidemia   . Hypertension   . Hypogonadism in male   . Impaired functional mobility, balance, gait, and endurance 11/14/2015   uses motorized wheelchair  . Insomnia 06/29/2013  . Low back pain   . Lumbosacral spondylosis without myelopathy   . Memory loss   . Mild cognitive impairment   . Neurogenic bladder   . OP (osteoporosis)   . Organic sleep apnea   . Osteoporosis 03/15/2004  . Peripheral neuropathy   . Radial nerve palsy   . Scoliosis 06/29/2013  .  Seizure (HCC) 07/31/2016  . Sleep apnea    CPAP to bring DOS sleep study done in [place]  . Spinal compression fracture (HCC)   . Spinal stenosis   . Spondylolisthesis, acquired   . Urinary incontinence, stress, male 08/10/2015  . Urinary urgency   . Uses hearing aid 10/12/2015  . Venous stasis   . Venous stasis dermatitis of both lower extremities 06/29/2013  . Vision abnormalities   . Vitamin B12 deficiency 09/27/2016  . Wrist drop, bilateral     Past Surgical History:  Procedure Laterality Date  . CARPAL TUNNEL RELEASE Left 03/16/2016   Dr. Kennedy Bucker  . CERVICAL SPINE SURGERY    . CORNEAL EYE SURGERY Right 09/08/2014   PTK (Dr. Chales Abrahams)  . HEMORRHOID SURGERY    . HERNIA REPAIR     left side  . INTRATHECAL PUMP IMPLANTATION  04/28/2014   Procedure: REVISION INTRATHECAL CATH; Surgeon: Burman Blacksmith, MD; Location: [facility] OR; Service: Anes/ Pain Mgmt; Laterality: N/A;  . JOINT REPLACEMENT Bilateral    bilateral knee replacements  . LUMBAR SPINE SURGERY    . PAIN PUMP IMPLANTATION     multiple procedures for removal and insertion this is his 3rd pain pump  . PAIN PUMP REMOVAL     multiple procedures for removal and insertion this is his 3rd  pain pump  . Proleive System Treatment  2006  . ROTATOR CUFF REPAIR Right    x2  . SPINAL CORD STIMULATOR IMPLANT     x2  . THORACIC SPINE SURGERY    . TONSILLECTOMY      Social History   Socioeconomic History  . Marital status: Married    Spouse name: Not on file  . Number of children: Not on file  . Years of education: Not on file  . Highest education level: Not on file  Occupational History  . Not on file  Social Needs  . Financial resource strain: Not on file  . Food insecurity:    Worry: Not on file    Inability: Not on file  . Transportation needs:    Medical: Not on file    Non-medical: Not on file  Tobacco Use  . Smoking status: Never Smoker  . Smokeless tobacco: Never Used  Substance and Sexual  Activity  . Alcohol use: No    Alcohol/week: 0.0 standard drinks    Comment: history of alcohol addiction, attends regular AA meetings  . Drug use: No  . Sexual activity: Not on file  Lifestyle  . Physical activity:    Days per week: Not on file    Minutes per session: Not on file  . Stress: Not on file  Relationships  . Social connections:    Talks on phone: Not on file    Gets together: Not on file    Attends religious service: Not on file    Active member of club or organization: Not on file    Attends meetings of clubs or organizations: Not on file    Relationship status: Not on file  . Intimate partner violence:    Fear of current or ex partner: Not on file    Emotionally abused: Not on file    Physically abused: Not on file    Forced sexual activity: Not on file  Other Topics Concern  . Not on file  Social History Narrative  . Not on file   Family History  Problem Relation Age of Onset  . Stroke Father   . Alcohol abuse Father   . Diabetes type II Father   . Heart disease Father        pacemaker  . Diabetes type II Brother   . Heart disease Brother        pacemaker  . Stroke Mother   . COPD Mother   . Alcohol abuse Brother   . Arthritis Sister   . Glaucoma Paternal Uncle   . Sleep apnea Son   . Kidney disease Neg Hx   . Prostate cancer Neg Hx   . Macular degeneration Neg Hx       VITAL SIGNS BP 103/80   Pulse 70   Temp 98.2 F (36.8 C)   Resp 20   Ht 5\' 9"  (1.753 m)   Wt 190 lb 3.2 oz (86.3 kg)   SpO2 97%   BMI 28.09 kg/m   Outpatient Encounter Medications as of 12/18/2017  Medication Sig  . acetaminophen (TYLENOL) 325 MG tablet Take 975 mg by mouth every 8 (eight) hours as needed.  [name] Kitchen alendronate (FOSAMAX) 70 MG tablet Take 70 mg by mouth once a week. On Thursday  . alum & mag hydroxide-simeth (MAALOX PLUS) 400-400-40 MG/5ML suspension Give 30 ml by mouth daily with meals as needed  . ammonium lactate (LAC-HYDRIN) 12 % lotion Apply 1  application topically 2 (two)  times daily.   . calcium-vitamin D (OSCAL WITH D) 500-200 MG-UNIT tablet Take 1 tablet by mouth 2 (two) times daily.  . carbamide peroxide (DEBROX) 6.5 % OTIC solution 5 drops 2 (two) times daily.  . DULoxetine (CYMBALTA) 30 MG capsule Take 30 mg by mouth at bedtime.  . gabapentin (NEURONTIN) 800 MG tablet Take 800 mg by mouth 3 (three) times daily.  . mirabegron ER (MYRBETRIQ) 50 MG TB24 tablet Take 50 mg by mouth at bedtime.  . naloxegol oxalate (MOVANTIK) 12.5 MG TABS tablet Take 12.5 mg by mouth every morning.  [name] Kitchen. omeprazole (PRILOSEC) 20 MG capsule Take 20 mg by mouth daily.  . polyethylene glycol (MIRALAX / GLYCOLAX) packet Take 17 g by mouth daily as needed.   [name] Kitchen. Propylene Glycol-Glycerin (ARTIFICIAL TEARS) 1-0.3 % SOLN Place 1 drop into both eyes 2 (two) times daily as needed.  . ranitidine (ZANTAC) 150 MG tablet Take 150 mg by mouth 2 (two) times daily.  . rivaroxaban (XARELTO) 20 MG TABS tablet Take 20 mg by mouth daily with supper.  . Skin Protectants, Misc. (EUCERIN) cream Wash feet with soap and water, dry. Apply Eucerin cream to dry, scaly skin daily, prn  . tamsulosin (FLOMAX) 0.4 MG CAPS capsule Take 0.4 mg by mouth daily.  [name] Kitchen. UNABLE TO FIND Regular diet. Aspiration precautions  . vitamin C (ASCORBIC ACID) 500 MG tablet Take 500 mg by mouth daily.   . [DISCONTINUED] calcium carbonate (OSCAL) 1500 (600 Ca) MG TABS tablet Take 600 mg of elemental calcium by mouth daily.  . [DISCONTINUED] docusate sodium (COLACE) 100 MG capsule Take 100 mg by mouth daily as needed.   . [DISCONTINUED] Infant Care Products [facility]DERMACLOU[facility] EX) Apply liberal amount topically  to area of skin irritation prn. OK to leave at bedside.  . [DISCONTINUED] Menthol, Topical Analgesic, (BIOFREEZE) 4 % GEL Apply topically topically to  bilateral shoulders twice daily for joint pain  . [DISCONTINUED] mirabegron ER (MYRBETRIQ) 50 MG TB24 tablet Take 1 tablet (50 mg total) by mouth daily.  (Patient not taking: Reported on 12/18/2017)  . [DISCONTINUED] Ostomy Supplies (SECURI-T NO STING WIPE) MISC Apply topically to  left foot 2nd and 3rd toes apply skin prep on dressing to affected areas twice daily. Allow to dry before donning socks  . [DISCONTINUED] sennosides-docusate sodium (SENOKOT-S) 8.6-50 MG tablet Take 2 tablets by mouth 2 (two) times daily as needed for constipation.    No facility-administered encounter medications on file as of 12/18/2017.      SIGNIFICANT DIAGNOSTIC EXAMS   PREVIOUS:   03-18-17: wbc 4.7; hgb 12.7; hct 38.2; mcv 93.3; plt 179; glucose 101; bun 23; creat 0.67; k+ 4.1; na++ 137; ca 8.9 liver normal albumin 3.4  Vit B 12: 758; vit D 34.1;  tsh 1.844;  09-17-17: wbc 3.2; hgb 11.6; hct 34.1; mcv 91.;5 plt 133  10-04-17: urine culture: no growth  10-15-17: glucose 103; bun 24; creat 0.91; k+ 4.2 na++ 140; liver normal albumin 3.6 tsh 2.870; vit B 12: 440; vit D 36.0;  10-21-17: keppra 25.8 11-19-17: wbc 3.7;hgb 12.3; hct 37.8; mcv 93.8; plt 135; glucose 105; bun 24; creat 0.91; k+ 4.2; na++ 140; ca 9.0; liver normal albumin 3.6; hgb a1c 6.1; chol 158; ldl 88; trig 47; hdl 61  Urine for micro-albumin : <3.0   NO NEW LABS.    Review of Systems  Constitutional: Negative for malaise/fatigue.  Respiratory: Negative for cough and shortness of breath.   Cardiovascular: Negative for chest pain, palpitations and  leg swelling.  Gastrointestinal: Negative for abdominal pain, constipation and heartburn.  Musculoskeletal: Positive for back pain, joint pain and myalgias.       His chronic pain is being managed   Skin: Negative.   Neurological: Negative for dizziness.  Psychiatric/Behavioral: The patient is not nervous/anxious.      Physical Exam  Constitutional: He is oriented to person, place, and time. He appears well-developed and well-nourished. No distress.  Neck: No thyromegaly present.  Cardiovascular: Normal rate, regular rhythm, normal heart sounds and  intact distal pulses.  Pulmonary/Chest: Effort normal and breath sounds normal. No respiratory distress.  Abdominal: Soft. Bowel sounds are normal. He exhibits no distension. There is no tenderness.  Musculoskeletal: He exhibits no edema.  Is able to move all extremities Has scoliosis and kyphosis   Uses wheelchair   Lymphadenopathy:    He has no cervical adenopathy.  Neurological: He is alert and oriented to person, place, and time.  Skin: Skin is warm and dry. He is not diaphoretic.  Psychiatric: He has a normal mood and affect.     ASSESSMENT/ PLAN:  TODAY;   1. Cervical spondylosis without myelopathy 2. Degenerative arthritis of lumbar spine  3.  Chronic pain syndrome  He is not able to use his scooter Will continue current plan of care Will not make changes in his medication regimen.         MD is aware of resident's narcotic use and is in agreement with current plan of care. We will attempt to wean resident as apropriate   Synthia Innocent NP [facility] Adult Medicine  Contact [phone] Monday through Friday 8am- 5pm  After hours call [phone]

## 2018-01-17 ENCOUNTER — Encounter: Payer: Self-pay | Admitting: Adult Health

## 2018-01-17 ENCOUNTER — Non-Acute Institutional Stay (SKILLED_NURSING_FACILITY): Payer: Medicare HMO | Admitting: Adult Health

## 2018-01-17 DIAGNOSIS — M5137 Other intervertebral disc degeneration, lumbosacral region: Secondary | ICD-10-CM | POA: Diagnosis not present

## 2018-01-17 DIAGNOSIS — M47896 Other spondylosis, lumbar region: Secondary | ICD-10-CM | POA: Diagnosis not present

## 2018-01-17 DIAGNOSIS — M810 Age-related osteoporosis without current pathological fracture: Secondary | ICD-10-CM

## 2018-01-17 DIAGNOSIS — T402X5A Adverse effect of other opioids, initial encounter: Secondary | ICD-10-CM

## 2018-01-17 DIAGNOSIS — G894 Chronic pain syndrome: Secondary | ICD-10-CM

## 2018-01-17 DIAGNOSIS — K5903 Drug induced constipation: Secondary | ICD-10-CM | POA: Diagnosis not present

## 2018-01-17 NOTE — Progress Notes (Signed)
Location:   [facility] Room Number: 325 A Place of Service:  SNF (31)   CODE STATUS: DNR  Allergies  Allergen Reactions  . Amitriptyline Rash  . Nortriptyline Other (See Comments) and Rash    GI  upset    Chief Complaint  Patient presents with  . Medical Management of Chronic Issues    Constipation due to opioid therapy; DDD lumbosacral; other osteoarthritis of lumbar region; chronic pain syndrome; age related osteoporosis without current pathological fracture.     HPI:  He is a 83 year old long term resident of this facility being seen for the management of his chronic illnesses: constipation; DDD; chronic pain; osteoporosis. He denies any constipation; no uncontrolled back or muscle pain. No changes in appetite; no insomnia.   Past Medical History:  Diagnosis Date  . Abdominal pain    other specified site  . Abnormal gait   . Advance directive in chart 10/12/2015  . Anemia   . Arthritis   . BPH (benign prostatic hyperplasia)   . Cataract   . Cervical spondylosis without myelopathy   . Cognitive dysfunction   . DDD (degenerative disc disease), cervical 06/29/2013  . Degeneration of cervical intervertebral disc   . Degeneration of lumbar intervertebral disc   . Depression   . Excessive daytime sleepiness   . Generalized osteoarthritis of multiple sites 11/19/2013  . GERD (gastroesophageal reflux disease)   . Hallux varus, acquired   . Hiatal hernia   . Hyperlipidemia   . Hypertension   . Hypogonadism in male   . Impaired functional mobility, balance, gait, and endurance 11/14/2015   uses motorized wheelchair  . Insomnia 06/29/2013  . Low back pain   . Lumbosacral spondylosis without myelopathy   . Memory loss   . Mild cognitive impairment   . Neurogenic bladder   . OP (osteoporosis)   . Organic sleep apnea   . Osteoporosis 03/15/2004  . Peripheral neuropathy   . Radial nerve palsy   . Scoliosis 06/29/2013  . Seizure (HCC)  07/31/2016  . Sleep apnea    CPAP to bring DOS sleep study done in [place]  . Spinal compression fracture (HCC)   . Spinal stenosis   . Spondylolisthesis, acquired   . Urinary incontinence, stress, male 08/10/2015  . Urinary urgency   . Uses hearing aid 10/12/2015  . Venous stasis   . Venous stasis dermatitis of both lower extremities 06/29/2013  . Vision abnormalities   . Vitamin B12 deficiency 09/27/2016  . Wrist drop, bilateral     Past Surgical History:  Procedure Laterality Date  . CARPAL TUNNEL RELEASE Left 03/16/2016   Dr. Kennedy Bucker  . CERVICAL SPINE SURGERY    . CORNEAL EYE SURGERY Right 09/08/2014   PTK (Dr. Chales Abrahams)  . HEMORRHOID SURGERY    . HERNIA REPAIR     left side  . INTRATHECAL PUMP IMPLANTATION  04/28/2014   Procedure: REVISION INTRATHECAL CATH; Surgeon: Burman Blacksmith, MD; Location: [facility] OR; Service: Anes/ Pain Mgmt; Laterality: N/A;  . JOINT REPLACEMENT Bilateral    bilateral knee replacements  . LUMBAR SPINE SURGERY    . PAIN PUMP IMPLANTATION     multiple procedures for removal and insertion this is his 3rd pain pump  . PAIN PUMP REMOVAL     multiple procedures for removal and insertion this is his 3rd pain pump  . Proleive System Treatment  2006  . ROTATOR CUFF REPAIR Right    x2  .  SPINAL CORD STIMULATOR IMPLANT     x2  . THORACIC SPINE SURGERY    . TONSILLECTOMY      Social History   Socioeconomic History  . Marital status: Married    Spouse name: Not on file  . Number of children: Not on file  . Years of education: Not on file  . Highest education level: Not on file  Occupational History  . Not on file  Social Needs  . Financial resource strain: Not on file  . Food insecurity:    Worry: Not on file    Inability: Not on file  . Transportation needs:    Medical: Not on file    Non-medical: Not on file  Tobacco Use  . Smoking status: Never Smoker  . Smokeless tobacco: Never Used  Substance and Sexual Activity  .  Alcohol use: No    Alcohol/week: 0.0 standard drinks    Comment: history of alcohol addiction, attends regular AA meetings  . Drug use: No  . Sexual activity: Not on file  Lifestyle  . Physical activity:    Days per week: Not on file    Minutes per session: Not on file  . Stress: Not on file  Relationships  . Social connections:    Talks on phone: Not on file    Gets together: Not on file    Attends religious service: Not on file    Active member of club or organization: Not on file    Attends meetings of clubs or organizations: Not on file    Relationship status: Not on file  . Intimate partner violence:    Fear of current or ex partner: Not on file    Emotionally abused: Not on file    Physically abused: Not on file    Forced sexual activity: Not on file  Other Topics Concern  . Not on file  Social History Narrative  . Not on file   Family History  Problem Relation Age of Onset  . Stroke Father   . Alcohol abuse Father   . Diabetes type II Father   . Heart disease Father        pacemaker  . Diabetes type II Brother   . Heart disease Brother        pacemaker  . Stroke Mother   . COPD Mother   . Alcohol abuse Brother   . Arthritis Sister   . Glaucoma Paternal Uncle   . Sleep apnea Son   . Kidney disease Neg Hx   . Prostate cancer Neg Hx   . Macular degeneration Neg Hx       VITAL SIGNS BP 119/77   Pulse 67   Temp 97.6 F (36.4 C)   Resp 18   Ht 5\' 9"  (1.753 m)   Wt 188 lb 3.2 oz (85.4 kg)   SpO2 97%   BMI 27.79 kg/m   Outpatient Encounter Medications as of 01/17/2018  Medication Sig  . acetaminophen (TYLENOL) 325 MG tablet Take 975 mg by mouth every 8 (eight) hours as needed.  [name] Kitchen. alendronate (FOSAMAX) 70 MG tablet Take 70 mg by mouth once a week. On Thursday  . alum & mag hydroxide-simeth (MAALOX PLUS) 400-400-40 MG/5ML suspension Give 30 ml by mouth daily with meals as needed  . ammonium lactate (LAC-HYDRIN) 12 % lotion Apply 1 application topically 2  (two) times daily as needed.   . calcium-vitamin D (OSCAL WITH D) 500-200 MG-UNIT tablet Take 1 tablet by mouth 2 (  two) times daily.  . carbamide peroxide (DEBROX) 6.5 % OTIC solution 5 drops 2 (two) times daily.  . DULoxetine (CYMBALTA) 30 MG capsule Take 30 mg by mouth at bedtime.  . gabapentin (NEURONTIN) 800 MG tablet Take 800 mg by mouth 3 (three) times daily.  . Glycerin-Hypromellose-PEG 400 (VISINE TEARS) 0.2-0.2-1 % SOLN Place 1 drop into both eyes 2 (two) times daily.  . mirabegron ER (MYRBETRIQ) 50 MG TB24 tablet Take 50 mg by mouth at bedtime.  . naloxegol oxalate (MOVANTIK) 12.5 MG TABS tablet Take 12.5 mg by mouth every morning.  [name] Kitchen. omeprazole (PRILOSEC) 20 MG capsule Take 20 mg by mouth daily.  . polyethylene glycol (MIRALAX / GLYCOLAX) packet Take 17 g by mouth daily as needed.   . rivaroxaban (XARELTO) 20 MG TABS tablet Take 20 mg by mouth daily with supper.  . Skin Protectants, Misc. (EUCERIN) cream Wash feet with soap and water, dry. Apply Eucerin cream to dry, scaly skin daily, prn  . tamsulosin (FLOMAX) 0.4 MG CAPS capsule Take 0.4 mg by mouth daily.  [name] Kitchen. UNABLE TO FIND Regular diet. Aspiration precautions  . vitamin C (ASCORBIC ACID) 500 MG tablet Take 500 mg by mouth daily.   . [DISCONTINUED] Propylene Glycol-Glycerin (ARTIFICIAL TEARS) 1-0.3 % SOLN Place 1 drop into both eyes 2 (two) times daily as needed.  . [DISCONTINUED] ranitidine (ZANTAC) 150 MG tablet Take 150 mg by mouth 2 (two) times daily.   No facility-administered encounter medications on file as of 01/17/2018.      SIGNIFICANT DIAGNOSTIC EXAMS  PREVIOUS:   03-18-17: wbc 4.7; hgb 12.7; hct 38.2; mcv 93.3; plt 179; glucose 101; bun 23; creat 0.67; k+ 4.1; na++ 137; ca 8.9 liver normal albumin 3.4  Vit B 12: 758; vit D 34.1;  tsh 1.844;  09-17-17: wbc 3.2; hgb 11.6; hct 34.1; mcv 91.;5 plt 133  10-04-17: urine culture: no growth  10-15-17: glucose 103; bun 24; creat 0.91; k+ 4.2 na++ 140; liver normal albumin 3.6 tsh  2.870; vit B 12: 440; vit D 36.0;  10-21-17: keppra 25.8 11-19-17: wbc 3.7;hgb 12.3; hct 37.8; mcv 93.8; plt 135; glucose 105; bun 24; creat 0.91; k+ 4.2; na++ 140; ca 9.0; liver normal albumin 3.6; hgb a1c 6.1; chol 158; ldl 88; trig 47; hdl 61  Urine for micro-albumin : <3.0   NO NEW LABS.    Review of Systems  Constitutional: Negative for malaise/fatigue.  Respiratory: Negative for cough and shortness of breath.   Cardiovascular: Negative for chest pain, palpitations and leg swelling.  Gastrointestinal: Negative for abdominal pain, constipation and heartburn.  Musculoskeletal: Positive for back pain, joint pain and myalgias.       Pain is being managed   Skin: Negative.   Neurological: Negative for dizziness.  Psychiatric/Behavioral: The patient is not nervous/anxious.     Physical Exam Constitutional:      General: He is not in acute distress.    Appearance: He is well-developed. He is not diaphoretic.  Neck:     Thyroid: No thyromegaly.  Cardiovascular:     Rate and Rhythm: Normal rate and regular rhythm.     Pulses: Normal pulses.     Heart sounds: Normal heart sounds.  Pulmonary:     Effort: Pulmonary effort is normal. No respiratory distress.     Breath sounds: Normal breath sounds.  Abdominal:     General: Bowel sounds are normal. There is no distension.     Palpations: Abdomen is soft.     Tenderness:  There is no abdominal tenderness.  Musculoskeletal:     Right lower leg: No edema.     Left lower leg: No edema.     Comments: Is able to move all extremities Has scoliosis and kyphosis   Uses wheelchair    Lymphadenopathy:     Cervical: No cervical adenopathy.  Skin:    General: Skin is warm and dry.  Neurological:     Mental Status: He is alert and oriented to person, place, and time.  Psychiatric:        Mood and Affect: Mood normal.      ASSESSMENT/ PLAN:  TODAY:   1. Seizure: no reports of seizure activity present: his keppra was stopped on  12-05-17; will monitor   2. Constipation due to opioid therapy: is stable will continue movantic 12.5 mg daily has prn colace miralax and senna s 2 tabs twice daily as needed   3.  DDD lumbosacral/degenerative arthritis of lumbar spine/ chronic pain syndrome: has pain pump: is stable will continue neuronton 800 mg three times daily biofreeze to shoulders twice daily   4. Osteoporosis: is stable will continue fosamax 70 mg weekly is taking calcium supplements  PREVIOUS   5. Depression with anxiety: is emotionally stable will continue cymbalta 30 mg daily   6. gerd without esophagitis: is stable will continue prilosec 20 mg daily   7. Cerebellar stroke: is neurologically stable will continue xarelto 20 mg daily   8. BPH with obstruction/lower urinary tract symptoms: is stable  will continue flomax 0.4 mg daily and myrbetriq 50 mg daily     MD is aware of resident's narcotic use and is in agreement with current plan of care. We will attempt to wean resident as apropriate   Synthia Innocent NP [facility]  Contact [phone] Monday through Friday 8am- 5pm  After hours call [phone]

## 2018-02-10 ENCOUNTER — Encounter: Payer: Self-pay | Admitting: Adult Health

## 2018-02-10 NOTE — Progress Notes (Signed)
Entered in error

## 2018-02-12 ENCOUNTER — Non-Acute Institutional Stay (SKILLED_NURSING_FACILITY): Payer: Medicare HMO | Admitting: Adult Health

## 2018-02-12 ENCOUNTER — Encounter: Payer: Self-pay | Admitting: Adult Health

## 2018-02-12 DIAGNOSIS — J00 Acute nasopharyngitis [common cold]: Secondary | ICD-10-CM

## 2018-02-12 NOTE — Progress Notes (Signed)
Location:   [facility] Room Number: 325 A Place of Service:  SNF (31)   CODE STATUS: DNR  Allergies  Allergen Reactions  . Amitriptyline Rash  . Nortriptyline Other (See Comments) and Rash    GI  upset    Chief Complaint  Patient presents with  . Acute Visit    Sore Throat    HPI:  He has had a sore throat for the past 4 days which is getting better. He has left ear pain; no cough; no headaches. There are reports of changes in appetite and no reports of fevers.    Past Medical History:  Diagnosis Date  . Abdominal pain    other specified site  . Abnormal gait   . Advance directive in chart 10/12/2015  . Anemia   . Arthritis   . BPH (benign prostatic hyperplasia)   . Cataract   . Cervical spondylosis without myelopathy   . Cognitive dysfunction   . DDD (degenerative disc disease), cervical 06/29/2013  . Degeneration of cervical intervertebral disc   . Degeneration of lumbar intervertebral disc   . Depression   . Excessive daytime sleepiness   . Generalized osteoarthritis of multiple sites 11/19/2013  . GERD (gastroesophageal reflux disease)   . Hallux varus, acquired   . Hiatal hernia   . Hyperlipidemia   . Hypertension   . Hypogonadism in male   . Impaired functional mobility, balance, gait, and endurance 11/14/2015   uses motorized wheelchair  . Insomnia 06/29/2013  . Low back pain   . Lumbosacral spondylosis without myelopathy   . Memory loss   . Mild cognitive impairment   . Neurogenic bladder   . OP (osteoporosis)   . Organic sleep apnea   . Osteoporosis 03/15/2004  . Peripheral neuropathy   . Radial nerve palsy   . Scoliosis 06/29/2013  . Seizure (HCC) 07/31/2016  . Sleep apnea    CPAP to bring DOS sleep study done in [place]  . Spinal compression fracture (HCC)   . Spinal stenosis   . Spondylolisthesis, acquired   . Urinary incontinence, stress, male 08/10/2015  . Urinary urgency   . Uses hearing aid  10/12/2015  . Venous stasis   . Venous stasis dermatitis of both lower extremities 06/29/2013  . Vision abnormalities   . Vitamin B12 deficiency 09/27/2016  . Wrist drop, bilateral     Past Surgical History:  Procedure Laterality Date  . CARPAL TUNNEL RELEASE Left 03/16/2016   Dr. Kennedy BuckerMichael Menz  . CERVICAL SPINE SURGERY    . CORNEAL EYE SURGERY Right 09/08/2014   PTK (Dr. Chales AbrahamsGupta)  . HEMORRHOID SURGERY    . HERNIA REPAIR     left side  . INTRATHECAL PUMP IMPLANTATION  04/28/2014   Procedure: REVISION INTRATHECAL CATH; Surgeon: Burman Blacksmithichard Levi Boortz-Marx, MD; Location: [facility] OR; Service: Anes/ Pain Mgmt; Laterality: N/A;  . JOINT REPLACEMENT Bilateral    bilateral knee replacements  . LUMBAR SPINE SURGERY    . PAIN PUMP IMPLANTATION     multiple procedures for removal and insertion this is his 3rd pain pump  . PAIN PUMP REMOVAL     multiple procedures for removal and insertion this is his 3rd pain pump  . Proleive System Treatment  2006  . ROTATOR CUFF REPAIR Right    x2  . SPINAL CORD STIMULATOR IMPLANT     x2  . THORACIC SPINE SURGERY    . TONSILLECTOMY      Social History  Socioeconomic History  . Marital status: Married    Spouse name: Not on file  . Number of children: Not on file  . Years of education: Not on file  . Highest education level: Not on file  Occupational History  . Not on file  Social Needs  . Financial resource strain: Not on file  . Food insecurity:    Worry: Not on file    Inability: Not on file  . Transportation needs:    Medical: Not on file    Non-medical: Not on file  Tobacco Use  . Smoking status: Never Smoker  . Smokeless tobacco: Never Used  Substance and Sexual Activity  . Alcohol use: No    Alcohol/week: 0.0 standard drinks    Comment: history of alcohol addiction, attends regular AA meetings  . Drug use: No  . Sexual activity: Not on file  Lifestyle  . Physical activity:    Days per week: Not on file    Minutes per session:  Not on file  . Stress: Not on file  Relationships  . Social connections:    Talks on phone: Not on file    Gets together: Not on file    Attends religious service: Not on file    Active member of club or organization: Not on file    Attends meetings of clubs or organizations: Not on file    Relationship status: Not on file  . Intimate partner violence:    Fear of current or ex partner: Not on file    Emotionally abused: Not on file    Physically abused: Not on file    Forced sexual activity: Not on file  Other Topics Concern  . Not on file  Social History Narrative  . Not on file   Family History  Problem Relation Age of Onset  . Stroke Father   . Alcohol abuse Father   . Diabetes type II Father   . Heart disease Father        pacemaker  . Diabetes type II Brother   . Heart disease Brother        pacemaker  . Stroke Mother   . COPD Mother   . Alcohol abuse Brother   . Arthritis Sister   . Glaucoma Paternal Uncle   . Sleep apnea Son   . Kidney disease Neg Hx   . Prostate cancer Neg Hx   . Macular degeneration Neg Hx       VITAL SIGNS BP 110/70   Pulse 79   Temp (!) 97 F (36.1 C)   Resp 18   Ht 5\' 9"  (1.753 m)   Wt 187 lb 1.6 oz (84.9 kg)   SpO2 100%   BMI 27.63 kg/m   Outpatient Encounter Medications as of 02/12/2018  Medication Sig  . acetaminophen (TYLENOL) 325 MG tablet Take 975 mg by mouth every 8 (eight) hours as needed.  [name] Kitchen alendronate (FOSAMAX) 70 MG tablet Take 70 mg by mouth once a week. On Thursday  . alum & mag hydroxide-simeth (MAALOX PLUS) 400-400-40 MG/5ML suspension Give 30 ml by mouth daily with meals as needed  . ammonium lactate (LAC-HYDRIN) 12 % lotion Apply 1 application topically 2 (two) times daily as needed.   . calcium-vitamin D (OSCAL WITH D) 500-200 MG-UNIT tablet Take 1 tablet by mouth 2 (two) times daily.  . carbamide peroxide (DEBROX) 6.5 % OTIC solution 5 drops 2 (two) times daily.  . DULoxetine (CYMBALTA) 30 MG capsule Take  30  mg by mouth at bedtime.  . ferrous sulfate 325 (65 FE) MG EC tablet Take 325 mg by mouth daily.  [name] Kitchen. gabapentin (NEURONTIN) 800 MG tablet Take 800 mg by mouth 3 (three) times daily.  . Glycerin-Hypromellose-PEG 400 (VISINE TEARS) 0.2-0.2-1 % SOLN Place 1 drop into both eyes 2 (two) times daily.  . mirabegron ER (MYRBETRIQ) 50 MG TB24 tablet Take 50 mg by mouth at bedtime.  . naloxegol oxalate (MOVANTIK) 12.5 MG TABS tablet Take 12.5 mg by mouth every morning.  [name] Kitchen. omeprazole (PRILOSEC) 20 MG capsule Take 20 mg by mouth daily.  . polyethylene glycol (MIRALAX / GLYCOLAX) packet Take 17 g by mouth daily as needed.   . Skin Protectants, Misc. (EUCERIN) cream Wash feet with soap and water, dry. Apply Eucerin cream to dry, scaly skin daily, prn  . tamsulosin (FLOMAX) 0.4 MG CAPS capsule Take 0.4 mg by mouth daily.  [name] Kitchen. UNABLE TO FIND Regular diet. Aspiration precautions  . vitamin C (ASCORBIC ACID) 500 MG tablet Take 500 mg by mouth daily.    No facility-administered encounter medications on file as of 02/12/2018.      SIGNIFICANT DIAGNOSTIC EXAMS  PREVIOUS:   03-18-17: wbc 4.7; hgb 12.7; hct 38.2; mcv 93.3; plt 179; glucose 101; bun 23; creat 0.67; k+ 4.1; na++ 137; ca 8.9 liver normal albumin 3.4  Vit B 12: 758; vit D 34.1;  tsh 1.844;  09-17-17: wbc 3.2; hgb 11.6; hct 34.1; mcv 91.;5 plt 133  10-04-17: urine culture: no growth  10-15-17: glucose 103; bun 24; creat 0.91; k+ 4.2 na++ 140; liver normal albumin 3.6 tsh 2.870; vit B 12: 440; vit D 36.0;  10-21-17: keppra 25.8 11-19-17: wbc 3.7;hgb 12.3; hct 37.8; mcv 93.8; plt 135; glucose 105; bun 24; creat 0.91; k+ 4.2; na++ 140; ca 9.0; liver normal albumin 3.6; hgb a1c 6.1; chol 158; ldl 88; trig 47; hdl 61  Urine for micro-albumin : <3.0   NO NEW LABS.   Review of Systems  Constitutional: Negative for fever and malaise/fatigue.  HENT: Positive for ear pain and sore throat. Negative for congestion.   Eyes: Negative.   Respiratory: Negative for  cough and shortness of breath.   Cardiovascular: Negative for chest pain, palpitations and leg swelling.  Gastrointestinal: Negative for abdominal pain, constipation and heartburn.  Musculoskeletal: Negative for back pain, joint pain and myalgias.  Skin: Negative.   Neurological: Negative for dizziness.  Psychiatric/Behavioral: The patient is not nervous/anxious.     Physical Exam Constitutional:      General: He is not in acute distress.    Appearance: He is well-developed. He is not diaphoretic.  HENT:     Right Ear: Tympanic membrane normal.     Left Ear: Tympanic membrane normal.     Nose: Nose normal.     Mouth/Throat:     Mouth: Mucous membranes are moist.     Pharynx: Oropharynx is clear.  Eyes:     Conjunctiva/sclera: Conjunctivae normal.  Neck:     Musculoskeletal: Neck supple.     Thyroid: No thyromegaly.  Cardiovascular:     Rate and Rhythm: Normal rate and regular rhythm.     Pulses: Normal pulses.     Heart sounds: Normal heart sounds.  Pulmonary:     Effort: Pulmonary effort is normal. No respiratory distress.     Breath sounds: Normal breath sounds.  Abdominal:     General: Bowel sounds are normal. There is no distension.     Palpations: Abdomen is  soft.     Tenderness: There is no abdominal tenderness.  Musculoskeletal:     Right lower leg: No edema.     Left lower leg: No edema.     Comments:  Is able to move all extremities Has scoliosis and kyphosis   Uses wheelchair   Lymphadenopathy:     Cervical: No cervical adenopathy.  Skin:    General: Skin is warm and dry.  Neurological:     Mental Status: He is alert and oriented to person, place, and time.  Psychiatric:        Mood and Affect: Mood normal.     ASSESSMENT/ PLAN:  TODAY:   1. Common cold: is slowly improving; will continue current supportive measures and will monitor    MD is aware of resident's narcotic use and is in agreement with current plan of care. We will attempt to wean  resident as apropriate   Synthia Innocent NP [facility]  Contact [phone] Monday through Friday 8am- 5pm  After hours call [phone]

## 2018-02-15 ENCOUNTER — Encounter
Admission: RE | Admit: 2018-02-15 | Discharge: 2018-02-15 | Disposition: A | Discharge status: Home / Self Care | Payer: Medicare Other | Source: Ambulatory Visit | Attending: Internal Medicine | Admitting: Internal Medicine

## 2018-02-17 ENCOUNTER — Encounter: Payer: Self-pay | Admitting: Adult Health

## 2018-02-17 ENCOUNTER — Non-Acute Institutional Stay (SKILLED_NURSING_FACILITY): Payer: Medicare HMO | Admitting: Adult Health

## 2018-02-17 DIAGNOSIS — I872 Venous insufficiency (chronic) (peripheral): Secondary | ICD-10-CM

## 2018-02-17 DIAGNOSIS — M810 Age-related osteoporosis without current pathological fracture: Secondary | ICD-10-CM | POA: Diagnosis not present

## 2018-02-17 DIAGNOSIS — K5903 Drug induced constipation: Secondary | ICD-10-CM

## 2018-02-17 DIAGNOSIS — I639 Cerebral infarction, unspecified: Secondary | ICD-10-CM

## 2018-02-17 DIAGNOSIS — N138 Other obstructive and reflux uropathy: Secondary | ICD-10-CM

## 2018-02-17 DIAGNOSIS — F418 Other specified anxiety disorders: Secondary | ICD-10-CM

## 2018-02-17 DIAGNOSIS — G4733 Obstructive sleep apnea (adult) (pediatric): Secondary | ICD-10-CM

## 2018-02-17 DIAGNOSIS — K219 Gastro-esophageal reflux disease without esophagitis: Secondary | ICD-10-CM | POA: Diagnosis not present

## 2018-02-17 DIAGNOSIS — T402X5A Adverse effect of other opioids, initial encounter: Secondary | ICD-10-CM

## 2018-02-17 DIAGNOSIS — N401 Enlarged prostate with lower urinary tract symptoms: Secondary | ICD-10-CM

## 2018-02-17 DIAGNOSIS — M4120 Other idiopathic scoliosis, site unspecified: Secondary | ICD-10-CM

## 2018-02-17 DIAGNOSIS — M5137 Other intervertebral disc degeneration, lumbosacral region: Secondary | ICD-10-CM

## 2018-02-17 NOTE — Progress Notes (Signed)
Provider:  Synthia Innocenteborah Green, NP [facility]:  [facility] Room Number: 325 A Place of Service:  SNF (31)   PCP: Orene DesanctisBehling, Karen, MD Patient Care Team: Orene DesanctisBehling, Karen, MD as PCP - General (Pediatrics) Verner MouldFowler, Vickie A, MD (Family Medicine) Chilton SiGreen, Chong Sicilianeborah S, NP as Nurse Practitioner (Geriatric Medicine)  Extended Emergency Contact Information Primary Emergency Contact: [facility],Paige Address: [address]          [place], [place] [address] [place] Home Phone: [phone] Mobile Phone: [phone] Relation: Daughter Secondary Emergency Contact: Forde RadonBurns,Sara Address: [address]          [place], [place] [address] [place] Home Phone: [phone] Relation: Spouse  Code Status: DNR Goals of Care: Advanced Directive information Advanced Directives 02/17/2018  Does Patient Have a Medical Advance Directive? Yes  Type of Advance Directive Out of facility DNR (pink MOST or yellow form)  Does patient want to make changes to medical advance directive? No - Patient declined  Copy of Healthcare Power of Attorney in Chart? -  Would patient like information on creating a medical advance directive? -  Pre-existing out of facility DNR order (yellow form or pink MOST form) Yellow form placed in chart (order not valid for inpatient use)      Allergies  Allergen Reactions  . Amitriptyline Rash  . Nortriptyline Other (See Comments) and Rash    GI  upset     Chief Complaint  Patient presents with  . Annual Exam        HPI: Patient is a 83 y.o. male seen today for an annual comprehensive examination. He has not been hospitalized over the past year. He is followed by pain management ; sleep medicine; and  Neurology.  He continues to have an internal pump for pain management. He requires nominal assistance with his adls including: dressing bathing toileting and transfers.  He is wheelchair bound; is unable to ambulate but a step or two. He denies any  uncontrolled pain; no problems with constipation. No changes in appetite; no significant changes in weight. He continues to be followed for his chronic illnesses including: osteoporosis; gerd; constipation.   Past Medical History:  Diagnosis Date  . Abdominal pain    other specified site  . Abnormal gait   . Advance directive in chart 10/12/2015  . Anemia   . Arthritis   . BPH (benign prostatic hyperplasia)   . Cataract   . Cervical spondylosis without myelopathy   . Cognitive dysfunction   . DDD (degenerative disc disease), cervical 06/29/2013  . Degeneration of cervical intervertebral disc   . Degeneration of lumbar intervertebral disc   . Depression   . Excessive daytime sleepiness   . Generalized osteoarthritis of multiple sites 11/19/2013  . GERD (gastroesophageal reflux disease)   . Hallux varus, acquired   . Hiatal hernia   . Hyperlipidemia   . Hypertension   . Hypogonadism in male   . Impaired functional mobility, balance, gait, and endurance 11/14/2015   uses motorized wheelchair  . Insomnia 06/29/2013  . Low back pain   . Lumbosacral spondylosis without myelopathy   . Memory loss   . Mild cognitive impairment   . Neurogenic bladder   . OP (osteoporosis)   . Organic sleep apnea   . Osteoporosis 03/15/2004  . Peripheral neuropathy   . Radial nerve palsy   . Scoliosis 06/29/2013  . Seizure (HCC) 07/31/2016  . Sleep apnea    CPAP to bring DOS  sleep study done in [place]  . Spinal compression fracture (HCC)   . Spinal stenosis   . Spondylolisthesis, acquired   . Urinary incontinence, stress, male 08/10/2015  . Urinary urgency   . Uses hearing aid 10/12/2015  . Venous stasis   . Venous stasis dermatitis of both lower extremities 06/29/2013  . Vision abnormalities   . Vitamin B12 deficiency 09/27/2016  . Wrist drop, bilateral    Past Surgical History:  Procedure Laterality Date  . CARPAL TUNNEL RELEASE Left 03/16/2016   Dr. Kennedy BuckerMichael Menz  . CERVICAL  SPINE SURGERY    . CORNEAL EYE SURGERY Right 09/08/2014   PTK (Dr. Chales AbrahamsGupta)  . HEMORRHOID SURGERY    . HERNIA REPAIR     left side  . INTRATHECAL PUMP IMPLANTATION  04/28/2014   Procedure: REVISION INTRATHECAL CATH; Surgeon: Burman Blacksmithichard Levi Boortz-Marx, MD; Location: [facility] OR; Service: Anes/ Pain Mgmt; Laterality: N/A;  . JOINT REPLACEMENT Bilateral    bilateral knee replacements  . LUMBAR SPINE SURGERY    . PAIN PUMP IMPLANTATION     multiple procedures for removal and insertion this is his 3rd pain pump  . PAIN PUMP REMOVAL     multiple procedures for removal and insertion this is his 3rd pain pump  . Proleive System Treatment  2006  . ROTATOR CUFF REPAIR Right    x2  . SPINAL CORD STIMULATOR IMPLANT     x2  . THORACIC SPINE SURGERY    . TONSILLECTOMY      reports that he has never smoked. He has never used smokeless tobacco. He reports that he does not drink alcohol or use drugs. Social History   Socioeconomic History  . Marital status: Married    Spouse name: Not on file  . Number of children: Not on file  . Years of education: Not on file  . Highest education level: Not on file  Occupational History  . Not on file  Social Needs  . Financial resource strain: Not on file  . Food insecurity:    Worry: Not on file    Inability: Not on file  . Transportation needs:    Medical: Not on file    Non-medical: Not on file  Tobacco Use  . Smoking status: Never Smoker  . Smokeless tobacco: Never Used  Substance and Sexual Activity  . Alcohol use: No    Alcohol/week: 0.0 standard drinks    Comment: history of alcohol addiction, attends regular AA meetings  . Drug use: No  . Sexual activity: Not on file  Lifestyle  . Physical activity:    Days per week: Not on file    Minutes per session: Not on file  . Stress: Not on file  Relationships  . Social connections:    Talks on phone: Not on file    Gets together: Not on file    Attends religious service: Not on file    Active  member of club or organization: Not on file    Attends meetings of clubs or organizations: Not on file    Relationship status: Not on file  . Intimate partner violence:    Fear of current or ex partner: Not on file    Emotionally abused: Not on file    Physically abused: Not on file    Forced sexual activity: Not on file  Other Topics Concern  . Not on file  Social History Narrative  . Not on file   Family History  Problem Relation Age  of Onset  . Stroke Father   . Alcohol abuse Father   . Diabetes type II Father   . Heart disease Father        pacemaker  . Diabetes type II Brother   . Heart disease Brother        pacemaker  . Stroke Mother   . COPD Mother   . Alcohol abuse Brother   . Arthritis Sister   . Glaucoma Paternal Uncle   . Sleep apnea Son   . Kidney disease Neg Hx   . Prostate cancer Neg Hx   . Macular degeneration Neg Hx     Vitals:   02/17/18 1434  BP: 120/64  Pulse: 70  Resp: 18  Temp: (!) 97.3 F (36.3 C)  SpO2: 99%  Weight: 187 lb 1.6 oz (84.9 kg)  Height: 5\' 9"  (1.753 m)   Body mass index is 27.63 kg/m.  Allergies as of 02/17/2018      Reactions   Amitriptyline Rash   Nortriptyline Other (See Comments), Rash   GI  upset      Medication List       Accurate as of February 17, 2018  2:40 PM. Always use your most recent med list.        acetaminophen 325 MG tablet Commonly known as:  TYLENOL Take 975 mg by mouth every 8 (eight) hours as needed.   alendronate 70 MG tablet Commonly known as:  FOSAMAX Take 70 mg by mouth once a week. On Thursday   alum & mag hydroxide-simeth 400-400-40 MG/5ML suspension Commonly known as:  MAALOX PLUS Give 30 ml by mouth daily with meals as needed   ammonium lactate 12 % lotion Commonly known as:  LAC-HYDRIN Apply 1 application topically 2 (two) times daily as needed.   calcium-vitamin D 500-200 MG-UNIT tablet Commonly known as:  OSCAL WITH D Take 1 tablet by mouth 2 (two) times daily.     carbamide peroxide 6.5 % OTIC solution Commonly known as:  DEBROX 5 drops 2 (two) times daily.   DULoxetine 30 MG capsule Commonly known as:  CYMBALTA Take 30 mg by mouth at bedtime.   eucerin cream Wash feet with soap and water, dry. Apply Eucerin cream to dry, scaly skin daily, prn   ferrous sulfate 325 (65 FE) MG EC tablet Take 325 mg by mouth daily.   gabapentin 800 MG tablet Commonly known as:  NEURONTIN Take 800 mg by mouth 3 (three) times daily.   mirabegron ER 50 MG Tb24 tablet Commonly known as:  MYRBETRIQ Take 50 mg by mouth at bedtime.   MOVANTIK 12.5 MG Tabs tablet Generic drug:  naloxegol oxalate Take 12.5 mg by mouth every morning.   omeprazole 20 MG capsule Commonly known as:  PRILOSEC Take 20 mg by mouth daily.   polyethylene glycol packet Commonly known as:  MIRALAX / GLYCOLAX Take 17 g by mouth daily as needed.   tamsulosin 0.4 MG Caps capsule Commonly known as:  FLOMAX Take 0.4 mg by mouth daily.   UNABLE TO FIND Regular diet. Aspiration precautions   VISINE TEARS 0.2-0.2-1 % Soln Generic drug:  Glycerin-Hypromellose-PEG 400 Place 1 drop into both eyes 2 (two) times daily.   vitamin C 500 MG tablet Commonly known as:  ASCORBIC ACID Take 500 mg by mouth daily.        SIGNIFICANT DIAGNOSTIC EXAMS   PREVIOUS:   03-18-17: wbc 4.7; hgb 12.7; hct 38.2; mcv 93.3; plt 179; glucose 101; bun 23; creat  0.67; k+ 4.1; na++ 137; ca 8.9 liver normal albumin 3.4  Vit B 12: 758; vit D 34.1;  tsh 1.844;  09-17-17: wbc 3.2; hgb 11.6; hct 34.1; mcv 91.;5 plt 133  10-04-17: urine culture: no growth  10-15-17: glucose 103; bun 24; creat 0.91; k+ 4.2 na++ 140; liver normal albumin 3.6 tsh 2.870; vit B 12: 440; vit D 36.0;  10-21-17: keppra 25.8 11-19-17: wbc 3.7;hgb 12.3; hct 37.8; mcv 93.8; plt 135; glucose 105; bun 24; creat 0.91; k+ 4.2; na++ 140; ca 9.0; liver normal albumin 3.6; hgb a1c 6.1; chol 158; ldl 88; trig 47; hdl 61  Urine for micro-albumin : <3.0    NO NEW LABS.   Review of Systems  Constitutional: Negative for malaise/fatigue.  HENT: Negative for congestion.   Eyes: Negative for blurred vision.  Respiratory: Negative for cough and shortness of breath.   Cardiovascular: Negative for chest pain, palpitations and leg swelling.  Gastrointestinal: Negative for abdominal pain, constipation and heartburn.  Musculoskeletal: Negative for back pain, joint pain and myalgias.       His chronic pain is being managed   Skin: Negative.   Neurological: Negative for dizziness.  Psychiatric/Behavioral: The patient is not nervous/anxious.     Physical Exam Constitutional:      General: He is not in acute distress.    Appearance: He is well-developed. He is not diaphoretic.  HENT:     Right Ear: Tympanic membrane normal.     Left Ear: Tympanic membrane normal.     Mouth/Throat:     Mouth: Mucous membranes are moist.     Pharynx: Oropharynx is clear.  Eyes:     Conjunctiva/sclera: Conjunctivae normal.  Neck:     Musculoskeletal: Neck supple.     Thyroid: No thyromegaly.  Cardiovascular:     Rate and Rhythm: Normal rate and regular rhythm.     Heart sounds: Normal heart sounds.  Pulmonary:     Effort: Pulmonary effort is normal. No respiratory distress.     Breath sounds: Normal breath sounds.  Abdominal:     General: Bowel sounds are normal. There is no distension.     Palpations: Abdomen is soft.     Tenderness: There is no abdominal tenderness.  Musculoskeletal:     Right lower leg: No edema.     Left lower leg: No edema.     Comments: Is able to move all extremities Has scoliosis and kyphosis   Uses wheelchair     Lymphadenopathy:     Cervical: No cervical adenopathy.  Skin:    General: Skin is warm and dry.  Neurological:     Mental Status: He is alert and oriented to person, place, and time.  Psychiatric:        Mood and Affect: Mood normal.     Assessment/Plan   TODAY:   1. Osteoporosis: is stable will  continue fosamax 70 mg weekly is taking calcium supplements  2. Depression with anxiety: is emotionally stable will continue cymbalta 30 mg daily   3. gerd without esophagitis: is stable will continue prilosec 20 mg daily   4. Cerebellar stroke: is neurologically stable his xarelto was stopped by neurology   5. BPH with obstruction/lower urinary tract symptoms: is stable  will continue flomax 0.4 mg daily and myrbetriq 50 mg daily    6. Obstructive apnea: is stable uses CPAP  7. Chronic venous insufficiency wears TED hose daily  8. Constipation due to opioid therapy: is stable will continue movantic  12.5 mg daily has prn colace miralax and senna s 2 tabs twice daily as needed   9.  DDD lumbosacral/degenerative arthritis of lumbar spine/ chronic pain syndrome: has pain pump: is stable will continue neuronton 800 mg three times daily    Due for eye exam in August 2020 Otherwise health maintenance is up to date      MD is aware of resident's narcotic use and is in agreement with current plan of care. We will wean dosage as appropriate for resident   Synthia Innocent NP [facility]  Contact [phone] Monday through Friday 8am- 5pm  After hours call [phone]

## 2018-02-24 ENCOUNTER — Encounter: Payer: Self-pay | Admitting: Adult Health

## 2018-02-24 ENCOUNTER — Non-Acute Institutional Stay (SKILLED_NURSING_FACILITY): Payer: Medicare HMO | Admitting: Adult Health

## 2018-02-24 DIAGNOSIS — N401 Enlarged prostate with lower urinary tract symptoms: Secondary | ICD-10-CM

## 2018-02-24 DIAGNOSIS — N138 Other obstructive and reflux uropathy: Secondary | ICD-10-CM | POA: Diagnosis not present

## 2018-02-24 NOTE — Progress Notes (Signed)
Location:   [facility] Room Number: 325 A Place of Service:  SNF (31)   CODE STATUS: DNR  Allergies  Allergen Reactions  . Amitriptyline Rash  . Nortriptyline Other (See Comments) and Rash    GI  upset    Chief Complaint  Patient presents with  . Acute Visit    Rash in Abdominal folds    HPI:  He has several concerns. He does have a fungal rash on his abdomen which is improving with the current treatment the facility uses. He is complaining of nocturia up to 5-6 times at night. He is presently taking flomax and mybetriq daily. He states that over the past month he has had a 2 episodes of expressive aphasia. They were both self limiting and resolved on their own.   Past Medical History:  Diagnosis Date  . Abdominal pain    other specified site  . Abnormal gait   . Advance directive in chart 10/12/2015  . Anemia   . Arthritis   . BPH (benign prostatic hyperplasia)   . Cataract   . Cervical spondylosis without myelopathy   . Cognitive dysfunction   . DDD (degenerative disc disease), cervical 06/29/2013  . Degeneration of cervical intervertebral disc   . Degeneration of lumbar intervertebral disc   . Depression   . Excessive daytime sleepiness   . Generalized osteoarthritis of multiple sites 11/19/2013  . GERD (gastroesophageal reflux disease)   . Hallux varus, acquired   . Hiatal hernia   . Hyperlipidemia   . Hypertension   . Hypogonadism in male   . Impaired functional mobility, balance, gait, and endurance 11/14/2015   uses motorized wheelchair  . Insomnia 06/29/2013  . Low back pain   . Lumbosacral spondylosis without myelopathy   . Memory loss   . Mild cognitive impairment   . Neurogenic bladder   . OP (osteoporosis)   . Organic sleep apnea   . Osteoporosis 03/15/2004  . Peripheral neuropathy   . Radial nerve palsy   . Scoliosis 06/29/2013  . Seizure (HCC) 07/31/2016  . Sleep apnea    CPAP to bring DOS [place] in  [place]  . Spinal compression fracture (HCC)   . Spinal stenosis   . Spondylolisthesis, acquired   . Urinary incontinence, stress, male 08/10/2015  . Urinary urgency   . Uses hearing aid 10/12/2015  . Venous stasis   . Venous stasis dermatitis of both lower extremities 06/29/2013  . Vision abnormalities   . Vitamin B12 deficiency 09/27/2016  . Wrist drop, bilateral     Past Surgical History:  Procedure Laterality Date  . CARPAL TUNNEL RELEASE Left 03/16/2016   Dr. Kennedy Bucker  . CERVICAL SPINE SURGERY    . CORNEAL EYE SURGERY Right 09/08/2014   PTK (Dr. Chales Abrahams)  . HEMORRHOID SURGERY    . HERNIA REPAIR     left side  . INTRATHECAL PUMP IMPLANTATION  04/28/2014   Procedure: REVISION INTRATHECAL CATH; Surgeon: Burman Blacksmith, MD; Location: [facility] OR; Service: Anes/ Pain Mgmt; Laterality: N/A;  . JOINT REPLACEMENT Bilateral    bilateral knee replacements  . LUMBAR SPINE SURGERY    . PAIN PUMP IMPLANTATION     multiple procedures for removal and insertion this is his 3rd pain pump  . PAIN PUMP REMOVAL     multiple procedures for removal and insertion this is his 3rd pain pump  . Proleive System Treatment  2006  . ROTATOR CUFF REPAIR Right  x2  . SPINAL CORD STIMULATOR IMPLANT     x2  . THORACIC SPINE SURGERY    . TONSILLECTOMY      Social History   Socioeconomic History  . Marital status: Married    Spouse name: Not on file  . Number of children: Not on file  . Years of education: Not on file  . Highest education level: Not on file  Occupational History  . Not on file  Social Needs  . Financial resource strain: Not on file  . Food insecurity:    Worry: Not on file    Inability: Not on file  . Transportation needs:    Medical: Not on file    Non-medical: Not on file  Tobacco Use  . Smoking status: Never Smoker  . Smokeless tobacco: Never Used  Substance and Sexual Activity  . Alcohol use: No    Alcohol/week: 0.0 standard drinks    Comment:  history of alcohol addiction, attends regular AA meetings  . Drug use: No  . Sexual activity: Not on file  Lifestyle  . Physical activity:    Days per week: Not on file    Minutes per session: Not on file  . Stress: Not on file  Relationships  . Social connections:    Talks on phone: Not on file    Gets together: Not on file    Attends religious service: Not on file    Active member of club or organization: Not on file    Attends meetings of clubs or organizations: Not on file    Relationship status: Not on file  . Intimate partner violence:    Fear of current or ex partner: Not on file    Emotionally abused: Not on file    Physically abused: Not on file    Forced sexual activity: Not on file  Other Topics Concern  . Not on file  Social History Narrative  . Not on file   Family History  Problem Relation Age of Onset  . Stroke Father   . Alcohol abuse Father   . Diabetes type II Father   . Heart disease Father        pacemaker  . Diabetes type II Brother   . Heart disease Brother        pacemaker  . Stroke Mother   . COPD Mother   . Alcohol abuse Brother   . Arthritis Sister   . Glaucoma Paternal Uncle   . Sleep apnea Son   . Kidney disease Neg Hx   . Prostate cancer Neg Hx   . Macular degeneration Neg Hx       VITAL SIGNS BP 127/86   Pulse 73   Temp 98.3 F (36.8 C)   Resp 20   Ht 5\' 9"  (1.753 m)   Wt 184 lb 11.2 oz (83.8 kg)   SpO2 98%   BMI 27.28 kg/m   Outpatient Encounter Medications as of 02/24/2018  Medication Sig  . acetaminophen (TYLENOL) 325 MG tablet Take 975 mg by mouth every 8 (eight) hours as needed.  [name] Kitchen. alendronate (FOSAMAX) 70 MG tablet Take 70 mg by mouth once a week. On Thursday  . alum & mag hydroxide-simeth (MAALOX PLUS) 400-400-40 MG/5ML suspension Give 30 ml by mouth daily with meals as needed  . ammonium lactate (LAC-HYDRIN) 12 % lotion Apply 1 application topically 2 (two) times daily as needed.   . calcium-vitamin D (OSCAL WITH  D) 500-200 MG-UNIT tablet Take 1 tablet  by mouth 2 (two) times daily.  . carbamide peroxide (DEBROX) 6.5 % OTIC solution 5 drops 2 (two) times daily.  . DULoxetine (CYMBALTA) 30 MG capsule Take 30 mg by mouth at bedtime.  . ferrous sulfate 325 (65 FE) MG EC tablet Take 325 mg by mouth daily.  [name] Kitchen gabapentin (NEURONTIN) 800 MG tablet Take 800 mg by mouth 3 (three) times daily.  . Glycerin-Hypromellose-PEG 400 (VISINE TEARS) 0.2-0.2-1 % SOLN Place 1 drop into both eyes 2 (two) times daily.  . mirabegron ER (MYRBETRIQ) 50 MG TB24 tablet Take 50 mg by mouth at bedtime.  . naloxegol oxalate (MOVANTIK) 12.5 MG TABS tablet Take 12.5 mg by mouth every morning.  [name] Kitchen omeprazole (PRILOSEC) 20 MG capsule Take 20 mg by mouth daily.  . polyethylene glycol (MIRALAX / GLYCOLAX) packet Take 17 g by mouth daily as needed.   . Skin Protectants, Misc. (EUCERIN) cream Wash feet with soap and water, dry. Apply Eucerin cream to dry, scaly skin daily, prn  . tamsulosin (FLOMAX) 0.4 MG CAPS capsule Take 0.4 mg by mouth daily.  [name] Kitchen UNABLE TO FIND Regular diet, small portions.   Aspiration precautions  . vitamin C (ASCORBIC ACID) 500 MG tablet Take 500 mg by mouth daily.    No facility-administered encounter medications on file as of 02/24/2018.      SIGNIFICANT DIAGNOSTIC EXAMS  PREVIOUS:   03-18-17: wbc 4.7; hgb 12.7; hct 38.2; mcv 93.3; plt 179; glucose 101; bun 23; creat 0.67; k+ 4.1; na++ 137; ca 8.9 liver normal albumin 3.4  Vit B 12: 758; vit D 34.1;  tsh 1.844;  09-17-17: wbc 3.2; hgb 11.6; hct 34.1; mcv 91.;5 plt 133  10-04-17: urine culture: no growth  10-15-17: glucose 103; bun 24; creat 0.91; k+ 4.2 na++ 140; liver normal albumin 3.6 tsh 2.870; vit B 12: 440; vit D 36.0;  10-21-17: keppra 25.8 11-19-17: wbc 3.7;hgb 12.3; hct 37.8; mcv 93.8; plt 135; glucose 105; bun 24; creat 0.91; k+ 4.2; na++ 140; ca 9.0; liver normal albumin 3.6; hgb a1c 6.1; chol 158; ldl 88; trig 47; hdl 61  Urine for micro-albumin : <3.0    TODAY:   Iron 80; tibc 330; ferritin 35    Review of Systems  Constitutional: Negative for malaise/fatigue.  Respiratory: Negative for cough and shortness of breath.   Cardiovascular: Negative for chest pain, palpitations and leg swelling.  Gastrointestinal: Negative for abdominal pain, constipation and heartburn.  Genitourinary:       Nocturia   Musculoskeletal: Negative for back pain, joint pain and myalgias.  Skin: Positive for rash.  Neurological: Negative for dizziness.  Psychiatric/Behavioral: The patient is not nervous/anxious.     Physical Exam Constitutional:      General: He is not in acute distress.    Appearance: He is well-developed. He is not diaphoretic.  Neck:     Musculoskeletal: Neck supple.     Thyroid: No thyromegaly.  Cardiovascular:     Rate and Rhythm: Normal rate and regular rhythm.     Pulses: Normal pulses.     Heart sounds: Normal heart sounds.  Pulmonary:     Effort: Pulmonary effort is normal. No respiratory distress.     Breath sounds: Normal breath sounds.  Abdominal:     General: Bowel sounds are normal. There is no distension.     Palpations: Abdomen is soft.     Tenderness: There is no abdominal tenderness.  Musculoskeletal:     Right lower leg: No edema.  Left lower leg: No edema.     Comments: Is able to move all extremities Has scoliosis and kyphosis   Uses wheelchair     Lymphadenopathy:     Cervical: No cervical adenopathy.  Skin:    General: Skin is warm and dry.     Findings: Rash present.     Comments: Has a resolving rash on his abdomen   Neurological:     Mental Status: He is alert and oriented to person, place, and time.  Psychiatric:        Mood and Affect: Mood normal.      Assessment/Plan  TODAY:   1. BPH with obstruction /lower urinary tract symptoms: is worse: will increase flomax to 0.8 mg daily and will continue myrbetriq 50 mg daily     MD is aware of resident's narcotic use and is in agreement  with current plan of care. We will attempt to wean resident as apropriate   Synthia Innocenteborah Sena Clouatre NP [facility]  Contact [phone] Monday through Friday 8am- 5pm  After hours call [phone]

## 2018-03-03 ENCOUNTER — Non-Acute Institutional Stay (SKILLED_NURSING_FACILITY): Payer: Medicare HMO | Admitting: Adult Health

## 2018-03-03 ENCOUNTER — Encounter: Payer: Self-pay | Admitting: Adult Health

## 2018-03-03 DIAGNOSIS — K5909 Other constipation: Secondary | ICD-10-CM

## 2018-03-03 DIAGNOSIS — G894 Chronic pain syndrome: Secondary | ICD-10-CM

## 2018-03-03 DIAGNOSIS — B370 Candidal stomatitis: Secondary | ICD-10-CM

## 2018-03-03 DIAGNOSIS — D508 Other iron deficiency anemias: Secondary | ICD-10-CM

## 2018-03-03 NOTE — Progress Notes (Signed)
[facility]:  [facility] Room Number: 325-A Place of Service:  SNF (31) Provider:  Kenard Gower, NP  Patient Care Team: Orene Desanctis, MD as PCP - General (Pediatrics) Verner Mould, MD (Family Medicine) Chilton Si Chong Sicilian, NP as Nurse Practitioner (Geriatric Medicine)  Extended Emergency Contact Information Primary Emergency Contact: [facility] Address: [address]          [place], [place] [address] [place] of [place] Home Phone: [phone] Mobile Phone: [phone] Relation: Daughter Secondary Emergency Contact: Forde Radon Address: [address]          [place], [place] [address] [place] of [place] Home Phone: [phone] Relation: Spouse  Code Status:  DNR  Goals of care: Advanced Directive information Advanced Directives 03/03/2018  Does Patient Have a Medical Advance Directive? Yes  Type of Advance Directive Out of facility DNR (pink MOST or yellow form)  Does patient want to make changes to medical advance directive? No - Patient declined  Copy of Healthcare Power of Attorney in Chart? -  Would patient like information on creating a medical advance directive? -  Pre-existing out of facility DNR order (yellow form or pink MOST form) -     Chief Complaint  Patient presents with  . Acute Visit    Patient complains of sore throat, hoarseness, and dysphagia.    HPI:  Pt is an 83 y.o. male seen today for an acute visit secondary to complaints of a sore throat, hoarseness, and dysphagia that he thought may be secondary to his CPAP. He is a long-term care resident of [facility]	[place].  He has a PMH of dementia, spinal stenosis, major depression, BPH, GERD, and intracranial and intraspinal phlebitis and thrombophlebitis. He was seen in his room today. Noted that his tongue is covered with thick brownish coating. He verbalized having sore throat. No reported fever.He has chronic pain and has a pain pump on his abdomen. His pain level  is 6/10 on bilateral feet.   Past Medical History:  Diagnosis Date  . Abdominal pain    other specified site  . Abnormal gait   . Advance directive in chart 10/12/2015  . Anemia   . Arthritis   . BPH (benign prostatic hyperplasia)   . Cataract   . Cervical spondylosis without myelopathy   . Cognitive dysfunction   . DDD (degenerative disc disease), cervical 06/29/2013  . Degeneration of cervical intervertebral disc   . Degeneration of lumbar intervertebral disc   . Depression   . Excessive daytime sleepiness   . Generalized osteoarthritis of multiple sites 11/19/2013  . GERD (gastroesophageal reflux disease)   . Hallux varus, acquired   . Hiatal hernia   . Hyperlipidemia   . Hypertension   . Hypogonadism in male   . Impaired functional mobility, balance, gait, and endurance 11/14/2015   uses motorized wheelchair  . Insomnia 06/29/2013  . Low back pain   . Lumbosacral spondylosis without myelopathy   . Memory loss   . Mild cognitive impairment   . Neurogenic bladder   . OP (osteoporosis)   . Organic sleep apnea   . Osteoporosis 03/15/2004  . Peripheral neuropathy   . Radial nerve palsy   . Scoliosis 06/29/2013  . Seizure (HCC) 07/31/2016  . Sleep apnea    CPAP to bring DOS sleep study done in [place]  . Spinal compression fracture (HCC)   . Spinal stenosis   . Spondylolisthesis, acquired   . Urinary incontinence, stress, male 08/10/2015  .  Urinary urgency   . Uses hearing aid 10/12/2015  . Venous stasis   . Venous stasis dermatitis of both lower extremities 06/29/2013  . Vision abnormalities   . Vitamin B12 deficiency 09/27/2016  . Wrist drop, bilateral    Past Surgical History:  Procedure Laterality Date  . CARPAL TUNNEL RELEASE Left 03/16/2016   Dr. Kennedy Bucker  . CERVICAL SPINE SURGERY    . CORNEAL EYE SURGERY Right 09/08/2014   PTK (Dr. Chales Abrahams)  . HEMORRHOID SURGERY    . HERNIA REPAIR     left side  . INTRATHECAL PUMP IMPLANTATION  04/28/2014    Procedure: REVISION INTRATHECAL CATH; Surgeon: Burman Blacksmith, MD; Location: [facility] OR; Service: Anes/ Pain Mgmt; Laterality: N/A;  . JOINT REPLACEMENT Bilateral    bilateral knee replacements  . LUMBAR SPINE SURGERY    . PAIN PUMP IMPLANTATION     multiple procedures for removal and insertion this is his 3rd pain pump  . PAIN PUMP REMOVAL     multiple procedures for removal and insertion this is his 3rd pain pump  . Proleive System Treatment  2006  . ROTATOR CUFF REPAIR Right    x2  . SPINAL CORD STIMULATOR IMPLANT     x2  . THORACIC SPINE SURGERY    . TONSILLECTOMY      Allergies  Allergen Reactions  . Amitriptyline Rash  . Nortriptyline Other (See Comments) and Rash    GI  upset    Outpatient Encounter Medications as of 03/03/2018  Medication Sig  . acetaminophen (TYLENOL) 325 MG tablet Take 975 mg by mouth every 8 (eight) hours as needed.  [name] Kitchen alendronate (FOSAMAX) 70 MG tablet Take 70 mg by mouth once a week. On Thursday  . alum & mag hydroxide-simeth (MAALOX PLUS) 400-400-40 MG/5ML suspension Give 30 ml by mouth daily with meals as needed  . ammonium lactate (LAC-HYDRIN) 12 % lotion Apply 1 application topically 2 (two) times daily as needed.   . calcium-vitamin D (OSCAL WITH D) 500-200 MG-UNIT tablet Take 1 tablet by mouth 2 (two) times daily.  . carbamide peroxide (DEBROX) 6.5 % OTIC solution 5 drops 2 (two) times daily.  . DULoxetine (CYMBALTA) 30 MG capsule Take 30 mg by mouth at bedtime.  . ferrous sulfate 325 (65 FE) MG EC tablet Take 325 mg by mouth daily.  [name] Kitchen gabapentin (NEURONTIN) 800 MG tablet Take 800 mg by mouth 3 (three) times daily.  . Glycerin-Hypromellose-PEG 400 (VISINE TEARS) 0.2-0.2-1 % SOLN Place 1 drop into both eyes See admin instructions. BID scheduled and TID PRN  . mirabegron ER (MYRBETRIQ) 50 MG TB24 tablet Take 50 mg by mouth at bedtime.  . naloxegol oxalate (MOVANTIK) 12.5 MG TABS tablet Take 12.5 mg by mouth every morning.  [name] Kitchen omeprazole  (PRILOSEC) 20 MG capsule Take 20 mg by mouth daily.  . polyethylene glycol (MIRALAX / GLYCOLAX) packet Take 17 g by mouth daily as needed.   . Skin Protectants, Misc. (EUCERIN) cream Wash feet with soap and water, dry. Apply Eucerin cream to dry, scaly skin daily, prn  . tamsulosin (FLOMAX) 0.4 MG CAPS capsule Take 0.4 mg by mouth daily.  [name] Kitchen UNABLE TO FIND Regular diet, small portions.   Aspiration precautions  . vitamin C (ASCORBIC ACID) 500 MG tablet Take 500 mg by mouth daily.    No facility-administered encounter medications on file as of 03/03/2018.     Review of Systems  GENERAL: No change in appetite, no fatigue, no weight changes, no fever,  chills or weakness SKIN: Denies rash, itching, wounds, ulcer sores, or nail abnormalities MOUTH and THROAT: +oral pain and sore throat RESPIRATORY: no cough, SOB, DOE, wheezing, hemoptysis CARDIAC: No chest pain, edema or palpitations GI: No abdominal pain, diarrhea, constipation, heart burn, nausea or vomiting GU: Denies dysuria, frequency, hematuria, incontinence, or discharge NEUROLOGICAL: Denies dizziness, syncope, numbness, or headache PSYCHIATRIC: Denies feelings of depression or anxiety. No report of hallucinations, insomnia, paranoia, or agitation   Immunization History  Administered Date(s) Administered  . Influenza Split 09/27/2012  . Influenza, High Dose Seasonal PF 10/04/2016  . Influenza-Unspecified 10/05/2013, 10/04/2014, 09/09/2015, 10/04/2016, 11/01/2017  . PPD Test 03/29/2017  . Pneumococcal Conjugate-13 07/29/2013  . Tdap 05/31/2008  . Zoster 01/18/2006  . Zoster Recombinat (Shingrix) 05/30/2016, 10/19/2016   Pertinent  Health Maintenance Due  Topic Date Due  . PNA vac Low Risk Adult (2 of 2 - PPSV23) 03/13/2018 (Originally 07/30/2014)  . INFLUENZA VACCINE  Completed    Vitals:   03/03/18 1110  BP: 139/80  Pulse: 84  Resp: 18  Temp: 97.6 F (36.4 C)  TempSrc: Oral  SpO2: 100%  Weight: 184 lb 11.2 oz (83.8 kg)   Height: 5\' 9"  (1.753 m)   Body mass index is 27.28 kg/m.  Physical Exam  GENERAL APPEARANCE: Well nourished. Normal body habitus SKIN:  Skin is warm and dry.  MOUTH and THROAT: Lips are without lesions. Tongue covered with thick brownish coating NECK: supple, trachea midline, no neck masses, no thyroid tenderness LYMPHATICS: No LAN in the neck, no supraclavicular LAN RESPIRATORY: Breathing is even & unlabored, BS CTAB CARDIAC: RRR, no murmur,no extra heart sounds, no edema GI: Abdomen soft, normal BS, +pain pump EXTREMITIES:  Able to move X 4 extremities NEUROLOGICAL: There is no tremor. Speech is clear. Alert and oriented X 3. PSYCHIATRIC:  Affect and behavior are appropriate   Labs reviewed: Recent Labs    03/18/17 0505 08/20/17 1414 10/15/17 0630 11/19/17 0612  NA 137  --  140 140  K 4.1  --  4.2 4.2  CL 100*  --  101 101  CO2 29  --  32 32  GLUCOSE 101*  --  103* 105*  BUN 23*  --  24* 24*  CREATININE 0.67 1.00 0.91 0.91  CALCIUM 8.9  --  9.0 9.0  MG 2.2  --  2.4  --    Recent Labs    03/18/17 0505 10/15/17 0630 11/19/17 0612  AST 23 30 21   ALT 19 19 20   ALKPHOS 75 83 84  BILITOT 0.8 1.0 1.0  PROT 6.5 6.2* 6.3*  ALBUMIN 3.4* 3.6 3.6   Recent Labs    03/18/17 0505 09/17/17 0640 11/19/17 0612  WBC 4.7 3.2* 3.7*  NEUTROABS 2.8 1.5  --   HGB 12.7* 11.6* 12.3*  HCT 38.2* 34.1* 37.8*  MCV 93.3 91.5 93.8  PLT 179 133* 135*   Lab Results  Component Value Date   TSH 2.870 10/15/2017   Lab Results  Component Value Date   HGBA1C 6.1 (H) 11/19/2017   Lab Results  Component Value Date   CHOL 158 11/19/2017   HDL 61 11/19/2017   LDLCALC 88 11/19/2017   TRIG 47 11/19/2017   CHOLHDL 2.6 11/19/2017    Assessment/Plan  1. Oral candida -Will start nystatin suspension 100,000 unit/mL swish and swallow 5 mL 4 times daily x2 weeks, oral care daily  2. Other iron deficiency anemia Lab Results  Component Value Date   HGB 12.3 (L) 11/19/2017    -  Stable, will discontinue iron check CBC in 1 month   3. Chronic constipation -Stable, continue Movantik 12.5 mg daily, MiraLAX as needed  4. Chronic pain syndrome -Stable, verbalized having 6/10 pain on bilateral feet, has pain pump on his abdomen    Family/ staff Communication: Discussed plan of care with resident.  Labs/tests ordered:  CBC in 1 month  Goals of care:   Long-term care.   Kenard GowerMonina Medina-Vargas, NP [facility] [phone] (Monday-Friday 8:00 a.m. - 5:00 p.m.) [phone] (after hours)

## 2018-03-15 IMAGING — CR DG FOOT COMPLETE 3+V*R*
1 series · 3 of 3 positions shown · non-contrast
Comparison: None.

CLINICAL DATA: Right foot pain and bleeding. Injured foot while
riding motorized wheelchair, striking foot on couch. Bleeding to
second toe.

EXAM:
RIGHT FOOT COMPLETE - 3+ VIEW

[Series 1: dg foot complete right · 0.14mm/px · 3 of 3 slices shown]
[im 1/3]
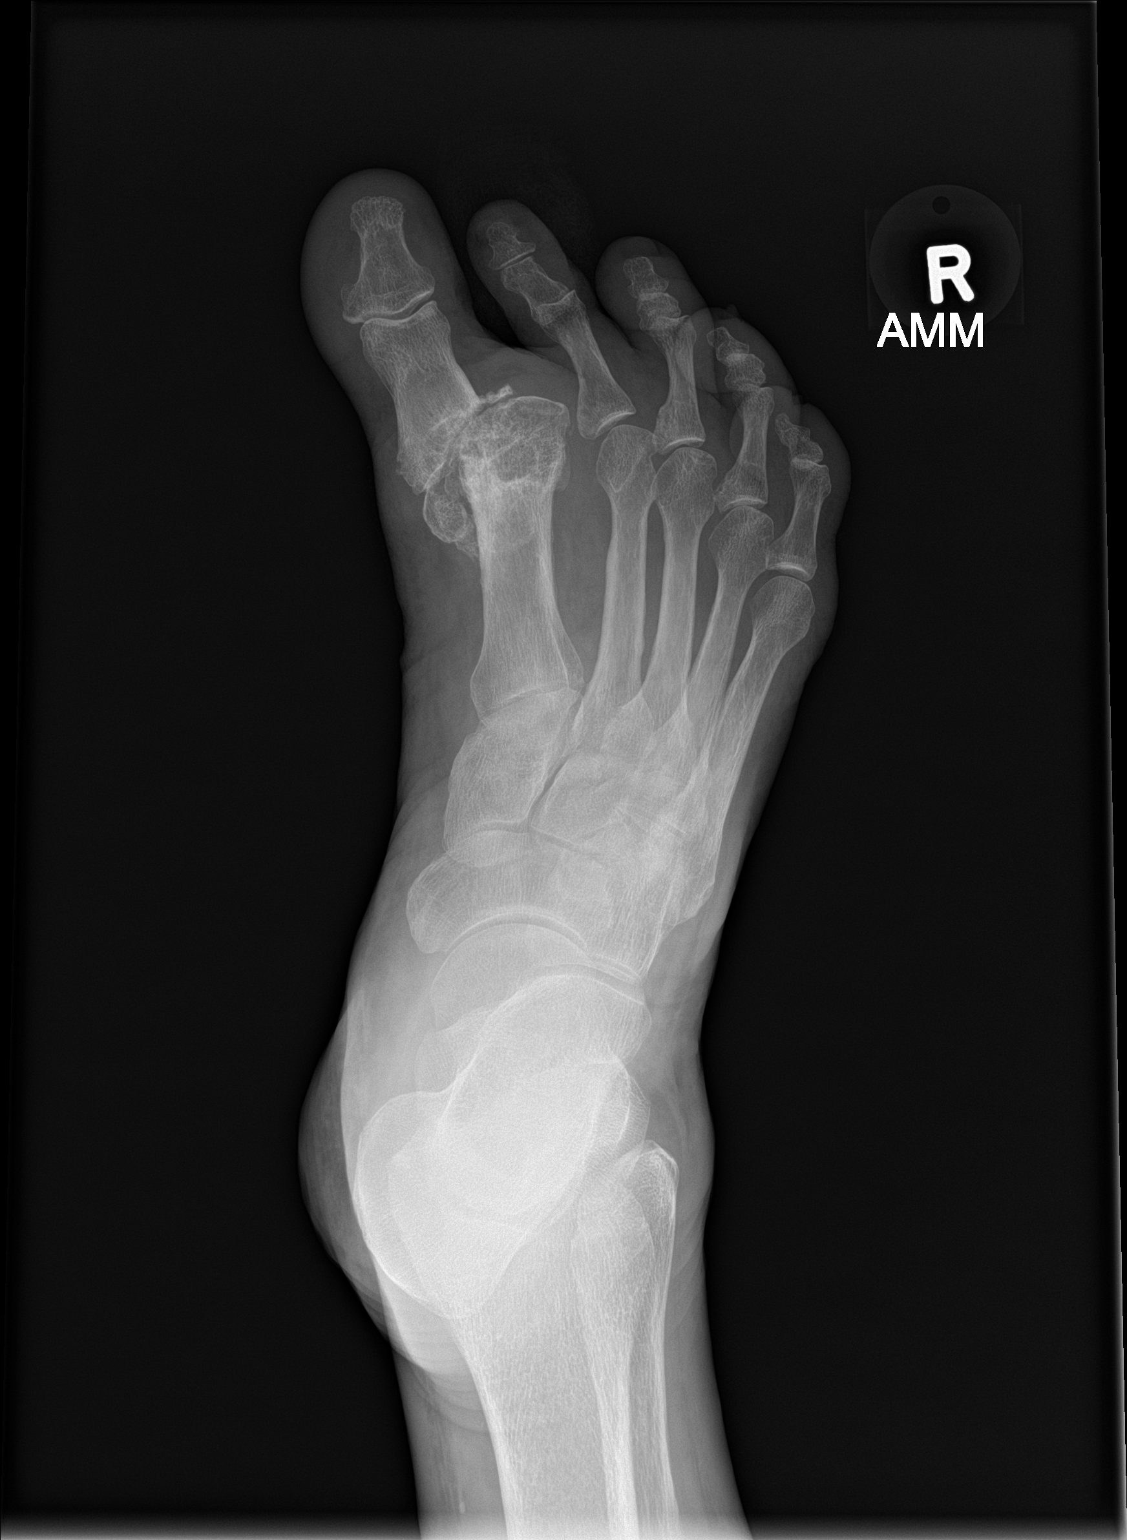
[im 2/3]
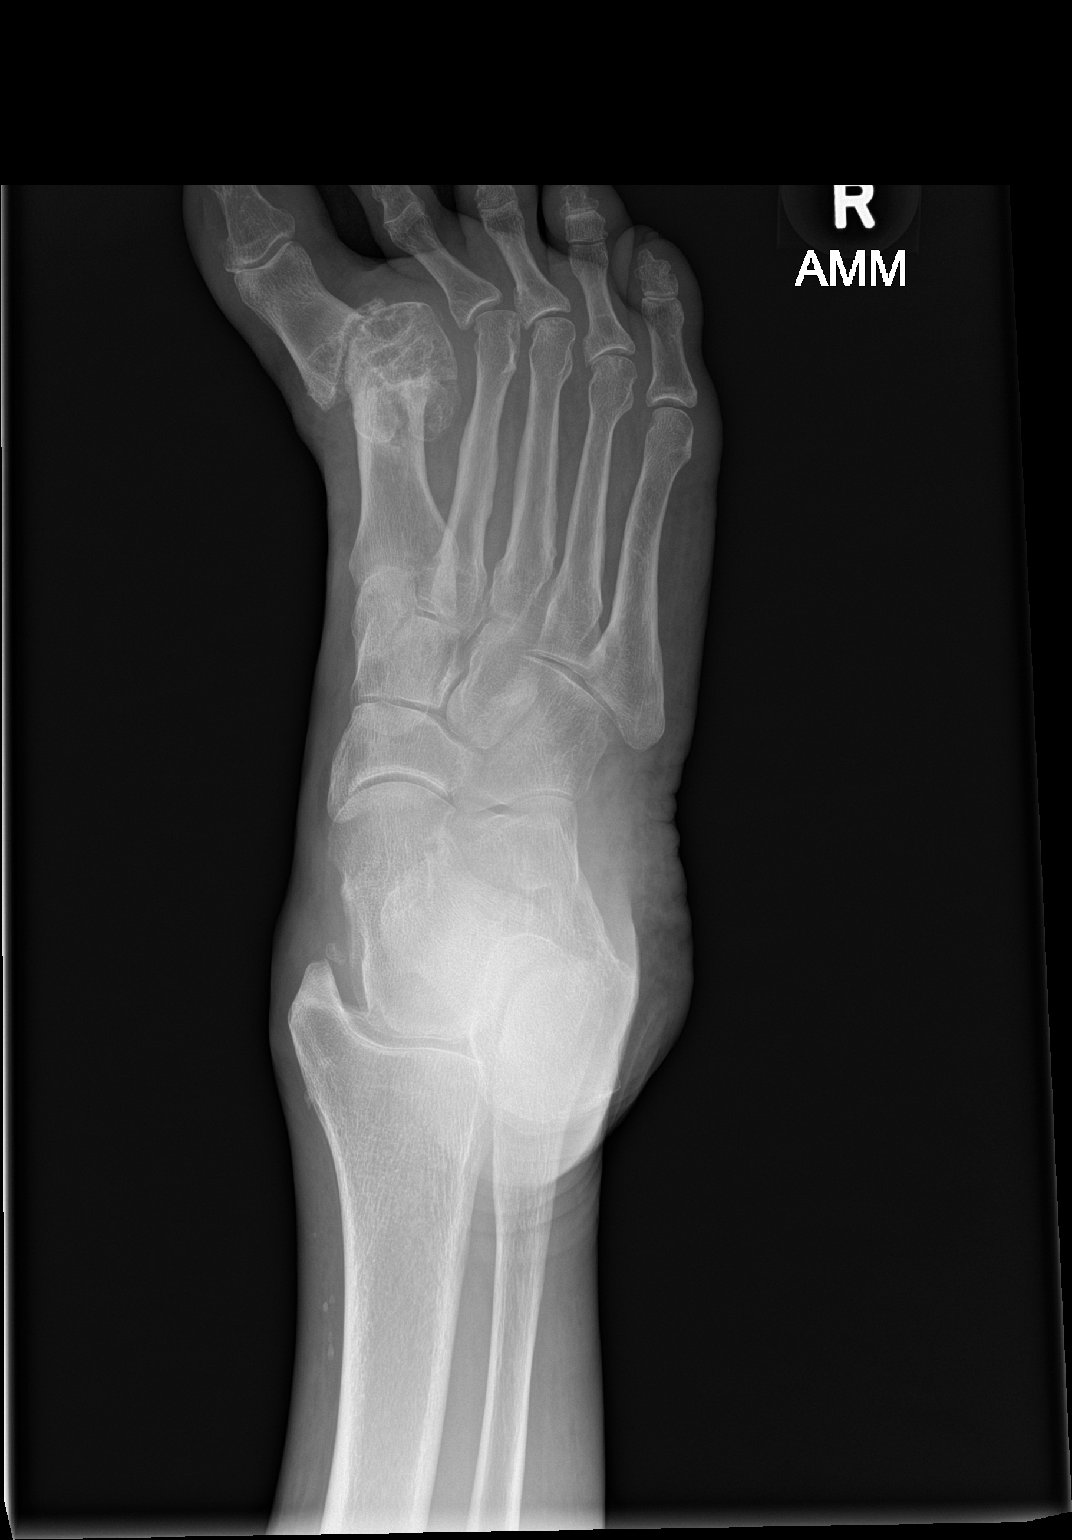
[im 3/3]
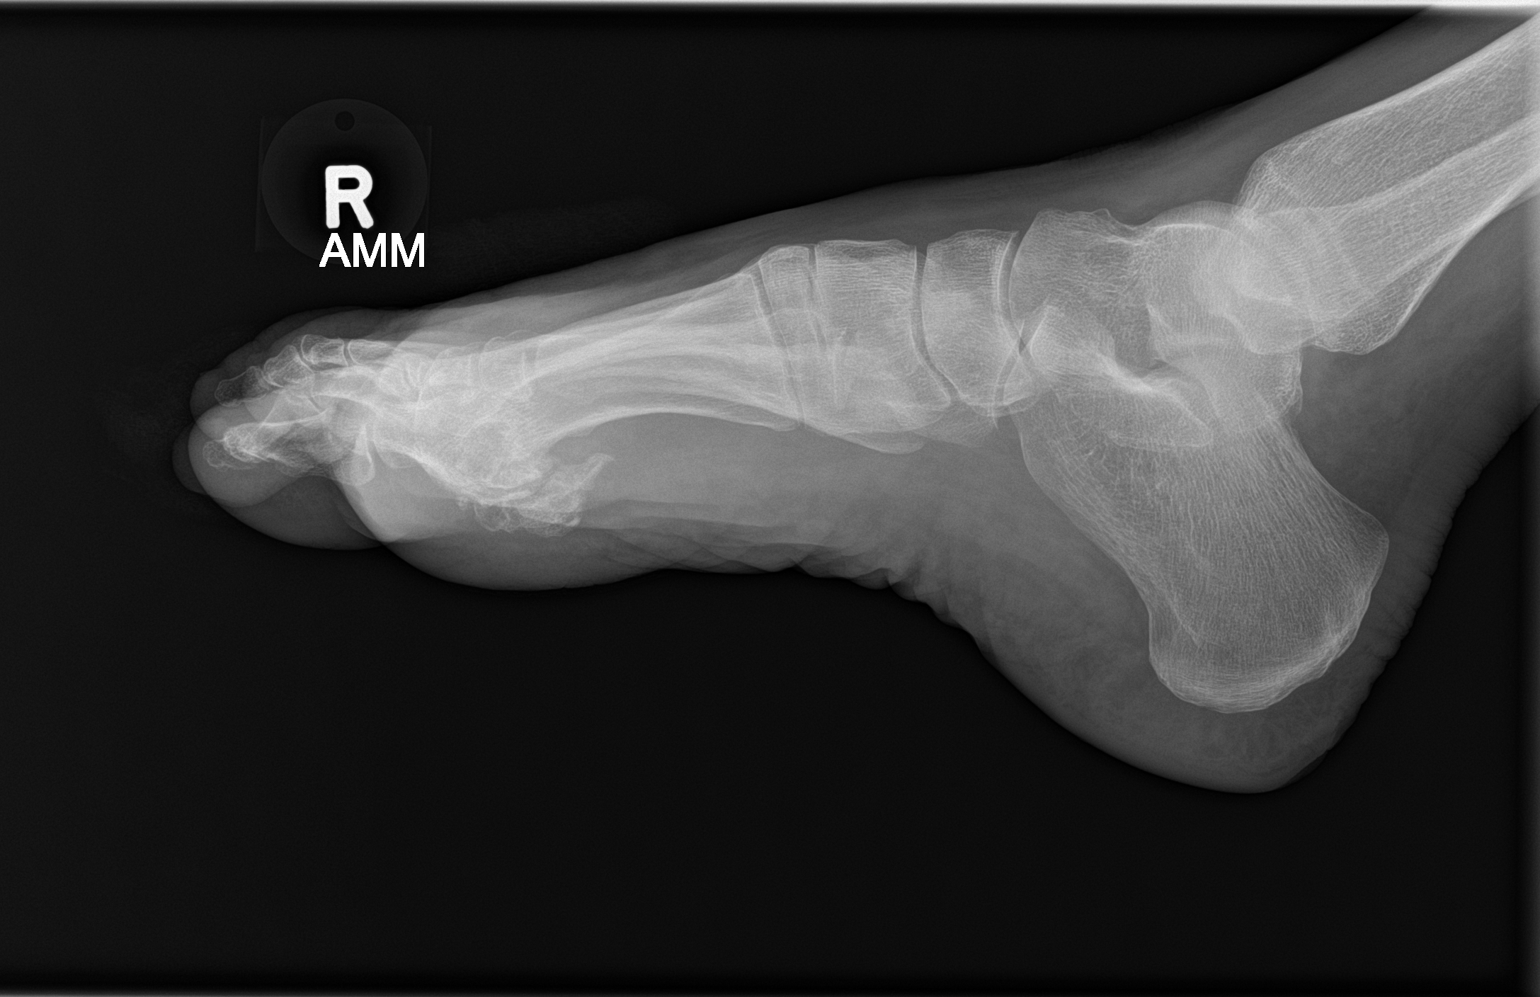

[3 of 3 positions shown; findings below may reference images not displayed]

FINDINGS: Chronic deformity of the great toe with medial subluxation and
angulation of the digit with respect to the metatarsal. Associated
chronic subchondral cystic change. The second, third, and fourth
digits are medially angulated to lesser extent. Lucency through the
second toe distal phalanx is seen only on a single view and
equivocal for fracture. No additional acute abnormality of the foot.
IMPRESSION: 1. Equivocal for fracture of the second toe distal phalanx.
2. Chronic appearing subluxation and angulation of the first through
fourth digits with prominent degenerative change at the first
metatarsal phalangeal joint.

## 2018-03-17 ENCOUNTER — Encounter
Admission: RE | Admit: 2018-03-17 | Discharge: 2018-03-17 | Disposition: A | Discharge status: Home / Self Care | Payer: Medicare HMO | Source: Ambulatory Visit | Attending: Internal Medicine | Admitting: Internal Medicine

## 2018-03-18 ENCOUNTER — Encounter: Payer: Self-pay | Admitting: Adult Health

## 2018-03-18 ENCOUNTER — Non-Acute Institutional Stay (SKILLED_NURSING_FACILITY): Payer: Medicare HMO | Admitting: Adult Health

## 2018-03-18 DIAGNOSIS — G894 Chronic pain syndrome: Secondary | ICD-10-CM

## 2018-03-18 DIAGNOSIS — B370 Candidal stomatitis: Secondary | ICD-10-CM | POA: Diagnosis not present

## 2018-03-18 DIAGNOSIS — K219 Gastro-esophageal reflux disease without esophagitis: Secondary | ICD-10-CM

## 2018-03-18 DIAGNOSIS — B372 Candidiasis of skin and nail: Secondary | ICD-10-CM | POA: Diagnosis not present

## 2018-03-18 DIAGNOSIS — N401 Enlarged prostate with lower urinary tract symptoms: Secondary | ICD-10-CM | POA: Diagnosis not present

## 2018-03-18 DIAGNOSIS — N138 Other obstructive and reflux uropathy: Secondary | ICD-10-CM

## 2018-03-18 DIAGNOSIS — K5909 Other constipation: Secondary | ICD-10-CM

## 2018-03-18 NOTE — Progress Notes (Signed)
Location:  [facility] Room Number: 305-A Place of Service:  SNF [phone]) Provider:  Kenard Gower, NP  Patient Care Team: Orene Desanctis, MD as PCP - General (Pediatrics) Verner Mould, MD (Family Medicine) Chilton Si Chong Sicilian, NP as Nurse Practitioner (Geriatric Medicine)  Extended Emergency Contact Information Primary Emergency Contact: [facility] Address: [address]          [place], [place] [address] [place] of [place] Home Phone: [phone] Mobile Phone: [phone] Relation: Daughter Secondary Emergency Contact: Forde Radon Address: [address]          [place], [place] [address] [place] of [place] Home Phone: [phone] Relation: Spouse  Code Status:  DNR  Goals of care: Advanced Directive information Advanced Directives 03/18/2018  Does Patient Have a Medical Advance Directive? Yes  Type of Advance Directive Out of facility DNR (pink MOST or yellow form)  Does patient want to make changes to medical advance directive? No - Patient declined  Copy of Healthcare Power of Attorney in Chart? -  Would patient like information on creating a medical advance directive? -  Pre-existing out of facility DNR order (yellow form or pink MOST form) -     Chief Complaint  Patient presents with  . Medical Management of Chronic Issues    Routine [facility] visit    HPI:  Pt is an 83 y.o. male seen today for medical management of chronic diseases.  He has been a long-term care resident of [facility]	[place].  He has a PMH of BPH, dementia, spinal stenosis, GERD, major depression, and intracranial and intraspinal phlebitis and thrombophlebitis.He was seen in his new room today. He complained of not having a shower in his private bathroom and scared of diseases he might catch when he uses the shower outside. He has recently finished Nystatin solution treatment X 2 weeks for oral yeast. He was noted to continue to have brownish coating on his  tongue. Abdominal rashes has improved. He continues to have chronic pain, 6-7/10, on his bilateral feet.   Past Medical History:  Diagnosis Date  . Abdominal pain    other specified site  . Abnormal gait   . Advance directive in chart 10/12/2015  . Anemia   . Arthritis   . BPH (benign prostatic hyperplasia)   . Cataract   . Cervical spondylosis without myelopathy   . Cognitive dysfunction   . DDD (degenerative disc disease), cervical 06/29/2013  . Degeneration of cervical intervertebral disc   . Degeneration of lumbar intervertebral disc   . Depression   . Excessive daytime sleepiness   . Generalized osteoarthritis of multiple sites 11/19/2013  . GERD (gastroesophageal reflux disease)   . Hallux varus, acquired   . Hiatal hernia   . Hyperlipidemia   . Hypertension   . Hypogonadism in male   . Impaired functional mobility, balance, gait, and endurance 11/14/2015   uses motorized wheelchair  . Insomnia 06/29/2013  . Low back pain   . Lumbosacral spondylosis without myelopathy   . Memory loss   . Mild cognitive impairment   . Neurogenic bladder   . OP (osteoporosis)   . Organic sleep apnea   . Osteoporosis 03/15/2004  . Peripheral neuropathy   . Radial nerve palsy   . Scoliosis 06/29/2013  . Seizure (HCC) 07/31/2016  . Sleep apnea    CPAP to bring DOS sleep study done in [place]  . Spinal compression fracture (HCC)   . Spinal stenosis   .  Spondylolisthesis, acquired   . Urinary incontinence, stress, male 08/10/2015  . Urinary urgency   . Uses hearing aid 10/12/2015  . Venous stasis   . Venous stasis dermatitis of both lower extremities 06/29/2013  . Vision abnormalities   . Vitamin B12 deficiency 09/27/2016  . Wrist drop, bilateral    Past Surgical History:  Procedure Laterality Date  . CARPAL TUNNEL RELEASE Left 03/16/2016   Dr. Kennedy Bucker  . CERVICAL SPINE SURGERY    . CORNEAL EYE SURGERY Right 09/08/2014   PTK (Dr. Chales Abrahams)  . HEMORRHOID SURGERY    .  HERNIA REPAIR     left side  . INTRATHECAL PUMP IMPLANTATION  04/28/2014   Procedure: REVISION INTRATHECAL CATH; Surgeon: Burman Blacksmith, MD; Location: [facility] OR; Service: Anes/ Pain Mgmt; Laterality: N/A;  . JOINT REPLACEMENT Bilateral    bilateral knee replacements  . LUMBAR SPINE SURGERY    . PAIN PUMP IMPLANTATION     multiple procedures for removal and insertion this is his 3rd pain pump  . PAIN PUMP REMOVAL     multiple procedures for removal and insertion this is his 3rd pain pump  . Proleive System Treatment  2006  . ROTATOR CUFF REPAIR Right    x2  . SPINAL CORD STIMULATOR IMPLANT     x2  . THORACIC SPINE SURGERY    . TONSILLECTOMY      Allergies  Allergen Reactions  . Amitriptyline Rash  . Nortriptyline Other (See Comments) and Rash    GI  upset    Outpatient Encounter Medications as of 03/18/2018  Medication Sig  . acetaminophen (TYLENOL) 325 MG tablet Take 975 mg by mouth every 8 (eight) hours as needed.  [name] Kitchen alendronate (FOSAMAX) 70 MG tablet Take 70 mg by mouth once a week. On Thursday  . alum & mag hydroxide-simeth (MAALOX PLUS) 400-400-40 MG/5ML suspension Give 30 ml by mouth daily with meals as needed  . ammonium lactate (LAC-HYDRIN) 12 % lotion Apply 1 application topically 2 (two) times daily as needed.   . calcium-vitamin D (OSCAL WITH D) 500-200 MG-UNIT tablet Take 1 tablet by mouth 2 (two) times daily.   . carbamide peroxide (DEBROX) 6.5 % OTIC solution 5 drops 2 (two) times daily.  . DULoxetine (CYMBALTA) 30 MG capsule Take 30 mg by mouth at bedtime.  . gabapentin (NEURONTIN) 800 MG tablet Take 800 mg by mouth 3 (three) times daily.  . Glycerin-Hypromellose-PEG 400 (VISINE TEARS) 0.2-0.2-1 % SOLN Place 1 drop into both eyes See admin instructions. BID scheduled and TID PRN  . mirabegron ER (MYRBETRIQ) 50 MG TB24 tablet Take 50 mg by mouth at bedtime.  . naloxegol oxalate (MOVANTIK) 12.5 MG TABS tablet Take 12.5 mg by mouth every morning.  . nystatin  ointment (MYCOSTATIN) Apply 1 application topically 4 (four) times daily. Apply to mid lower abdominal fold  . omeprazole (PRILOSEC) 20 MG capsule Take 20 mg by mouth daily.  . polyethylene glycol (MIRALAX / GLYCOLAX) packet Take 17 g by mouth daily as needed.   . Skin Protectants, Misc. (EUCERIN) cream Wash feet with soap and water, dry. Apply Eucerin cream to dry, scaly skin daily, prn  . tamsulosin (FLOMAX) 0.4 MG CAPS capsule Take 0.4 mg by mouth daily.   [name] Kitchen UNABLE TO FIND Regular diet, small portions.   Aspiration precautions  . vitamin C (ASCORBIC ACID) 500 MG tablet Take 500 mg by mouth daily.   . [DISCONTINUED] nystatin (MYCOSTATIN) 100000 UNIT/ML suspension Take 5 mLs by mouth 4 (  four) times daily.  . [DISCONTINUED] ferrous sulfate 325 (65 FE) MG EC tablet Take 325 mg by mouth daily.   No facility-administered encounter medications on file as of 03/18/2018.     Review of Systems  GENERAL: No change in appetite, no fatigue, no weight changes, no fever, chills or weakness MOUTH and THROAT: Denies oral discomfort, gingival pain or bleeding RESPIRATORY: no cough, SOB, DOE, wheezing, hemoptysis CARDIAC: No chest pain, edema or palpitations GI: No abdominal pain, diarrhea, constipation, heart burn, nausea or vomiting GU: Denies dysuria, frequency, hematuria, incontinence, or discharge NEUROLOGICAL: Denies dizziness, syncope, numbness, or headache PSYCHIATRIC: Denies feelings of depression or anxiety. No report of hallucinations, insomnia, paranoia, or agitation   Immunization History  Administered Date(s) Administered  . Influenza Split 09/27/2012  . Influenza, High Dose Seasonal PF 10/04/2016  . Influenza-Unspecified 10/05/2013, 10/04/2014, 09/09/2015, 10/04/2016, 11/01/2017  . PPD Test 03/29/2017  . Pneumococcal Conjugate-13 07/29/2013  . Tdap 05/31/2008  . Zoster 01/18/2006  . Zoster Recombinat (Shingrix) 05/30/2016, 10/19/2016   Pertinent  Health Maintenance Due  Topic Date  Due  . PNA vac Low Risk Adult (2 of 2 - PPSV23) 07/30/2014  . INFLUENZA VACCINE  Completed    Vitals:   03/18/18 0930  BP: 132/76  Pulse: 86  Resp: 18  Temp: 97.8 F (36.6 C)  TempSrc: Oral  SpO2: 96%  Weight: 184 lb 11.2 oz (83.8 kg)  Height: 5\' 9"  (1.753 m)   Body mass index is 27.28 kg/m.  Physical Exam  GENERAL APPEARANCE: Well nourished. In no acute distress. Normal body habitus SKIN:  Mild abdominal fold erythematous rashes.  MOUTH and THROAT: Lips are without lesions. Oral mucosa is moist and without lesions. Tongue has brownish coating RESPIRATORY: Breathing is even & unlabored, BS CTAB CARDIAC: RRR, no murmur,no extra heart sounds, no edema GI: Abdomen soft, normal BS, no masses, no tenderness, RLQ internal pain pump EXTREMITIES:  Able to move X 4 extremities NEUROLOGICAL: There is no tremor. Speech is clear.Alert and oriented X 3.  PSYCHIATRIC:  Affect and behavior are appropriate   Labs reviewed: Recent Labs    08/20/17 1414 10/15/17 0630 11/19/17 0612  NA  --  140 140  K  --  4.2 4.2  CL  --  101 101  CO2  --  32 32  GLUCOSE  --  103* 105*  BUN  --  24* 24*  CREATININE 1.00 0.91 0.91  CALCIUM  --  9.0 9.0  MG  --  2.4  --    Recent Labs    10/15/17 0630 11/19/17 0612  AST 30 21  ALT 19 20  ALKPHOS 83 84  BILITOT 1.0 1.0  PROT 6.2* 6.3*  ALBUMIN 3.6 3.6   Recent Labs    09/17/17 0640 11/19/17 0612  WBC 3.2* 3.7*  NEUTROABS 1.5  --   HGB 11.6* 12.3*  HCT 34.1* 37.8*  MCV 91.5 93.8  PLT 133* 135*   Lab Results  Component Value Date   TSH 2.870 10/15/2017   Lab Results  Component Value Date   HGBA1C 6.1 (H) 11/19/2017   Lab Results  Component Value Date   CHOL 158 11/19/2017   HDL 61 11/19/2017   LDLCALC 88 11/19/2017   TRIG 47 11/19/2017   CHOLHDL 2.6 11/19/2017    Assessment/Plan  1. Oral candida Will restart nystatin oral suspension 100,000 unit/mL 5 mL swish and swallow 4 times daily x2 weeks, encouraged to brush  his teeth when doing oral care  2. BPH with obstruction/lower urinary tract symptoms -Denies having any urinary retention, continue tamsulosin 0.4 mg 1 capsule at bedtime  3. Chronic pain syndrome -Verbalized having 6-7/10 pain every day, continues to have pain pump on his abdomen which is being refilled every 3 months at the pain clinic, continue duloxetine DR 30 mg 1 capsule at bedtime, gabapentin 800 mg 1 tab 3 times a day  4. Candidal skin infection -Improving rashes on his abdomen, continue nystatin ointment 100,000 unit/gram apply topically to abdominal folds 4 times a day till 03/26/2018  5. GERD without esophagitis -Continue omeprazole DR 20 mg 1 capsule daily  6. Chronic constipation -Continue Movantik 12.5 mg 1 tablet daily, recently started MiraLAX 17 g daily   Family/ staff Communication: Discussed plan of care with resident.  Labs/tests ordered:  None  Goals of care:   Long-term care.   Kenard Gower, NP [facility] [phone] (Monday-Friday 8:00 a.m. - 5:00 p.m.) [phone] (after hours)

## 2018-04-02 ENCOUNTER — Other Ambulatory Visit
Admission: RE | Admit: 2018-04-02 | Discharge: 2018-04-02 | Disposition: A | Discharge status: Home / Self Care | Payer: Medicare HMO | Source: Ambulatory Visit | Attending: Adult Health | Admitting: Adult Health

## 2018-04-02 DIAGNOSIS — D649 Anemia, unspecified: Secondary | ICD-10-CM | POA: Insufficient documentation

## 2018-04-02 LAB — CBC WITH DIFFERENTIAL/PLATELET
ABS IMMATURE GRANULOCYTES: 0.01 10*3/uL (ref 0.00–0.07)
BASOS ABS: 0 10*3/uL (ref 0.0–0.1)
BASOS PCT: 1 %
Eosinophils Absolute: 0.2 10*3/uL (ref 0.0–0.5)
Eosinophils Relative: 6 %
HCT: 36.8 % — ABNORMAL LOW (ref 39.0–52.0)
HEMOGLOBIN: 11.9 g/dL — AB (ref 13.0–17.0)
IMMATURE GRANULOCYTES: 0 %
LYMPHS PCT: 37 %
Lymphs Abs: 1.4 10*3/uL (ref 0.7–4.0)
MCH: 30.9 pg (ref 26.0–34.0)
MCHC: 32.3 g/dL (ref 30.0–36.0)
MCV: 95.6 fL (ref 80.0–100.0)
MONO ABS: 0.5 10*3/uL (ref 0.1–1.0)
Monocytes Relative: 12 %
NEUTROS ABS: 1.6 10*3/uL — AB (ref 1.7–7.7)
NEUTROS PCT: 44 %
NRBC: 0 % (ref 0.0–0.2)
PLATELETS: 133 10*3/uL — AB (ref 150–400)
RBC: 3.85 MIL/uL — AB (ref 4.22–5.81)
RDW: 13.2 % (ref 11.5–15.5)
WBC: 3.6 10*3/uL — AB (ref 4.0–10.5)

## 2018-04-16 ENCOUNTER — Encounter
Admission: RE | Admit: 2018-04-16 | Discharge: 2018-04-16 | Disposition: A | Discharge status: Home / Self Care | Payer: Medicare HMO | Source: Ambulatory Visit | Attending: Internal Medicine | Admitting: Internal Medicine

## 2018-04-17 ENCOUNTER — Encounter: Payer: Self-pay | Admitting: Adult Health

## 2018-04-17 ENCOUNTER — Non-Acute Institutional Stay (SKILLED_NURSING_FACILITY): Payer: Medicare HMO | Admitting: Adult Health

## 2018-04-17 DIAGNOSIS — N138 Other obstructive and reflux uropathy: Secondary | ICD-10-CM

## 2018-04-17 DIAGNOSIS — G894 Chronic pain syndrome: Secondary | ICD-10-CM

## 2018-04-17 DIAGNOSIS — G3184 Mild cognitive impairment, so stated: Secondary | ICD-10-CM

## 2018-04-17 DIAGNOSIS — B370 Candidal stomatitis: Secondary | ICD-10-CM

## 2018-04-17 DIAGNOSIS — H04123 Dry eye syndrome of bilateral lacrimal glands: Secondary | ICD-10-CM

## 2018-04-17 DIAGNOSIS — N401 Enlarged prostate with lower urinary tract symptoms: Secondary | ICD-10-CM

## 2018-04-17 DIAGNOSIS — G629 Polyneuropathy, unspecified: Secondary | ICD-10-CM

## 2018-04-17 DIAGNOSIS — K5909 Other constipation: Secondary | ICD-10-CM

## 2018-04-17 NOTE — Progress Notes (Signed)
[facility]:  [facility] Room Number: 305 A Place of Service:  SNF (31) Provider:  Kenard GowerMedina-Vargas, Monina, NP  Patient Care Team: Orene DesanctisBehling, Karen, MD as PCP - General (Pediatrics) Verner MouldFowler, Vickie A, MD (Family Medicine) Chilton SiGreen, Chong Sicilianeborah S, NP as Nurse Practitioner (Geriatric Medicine)  Extended Emergency Contact Information [facility] Emergency Contact: [facility] Address: [address]          [place], [place] [address] [place] Home Phone: [phone] Mobile Phone: [phone] Relation: Daughter Secondary Emergency Contact: Forde RadonBurns,Sara Address: [address]          [place], [place] [address] [place] Home Phone: [phone] Relation: Spouse  Code Status:  DNR  Goals of care: Advanced Directive information Advanced Directives 04/17/2018  Does Patient Have a Medical Advance Directive? Yes  Type of Advance Directive Out of facility DNR (pink MOST or yellow form)  Does patient want to make changes to medical advance directive? No - Patient declined  Copy of Healthcare Power of Attorney in Chart? -  Would patient like information on creating a medical advance directive? -  Pre-existing out of facility DNR order (yellow form or pink MOST form) Yellow form placed in chart (order not valid for inpatient use)     Chief Complaint  Patient presents with  . Medical Management of Chronic Issues    [facility]    HPI:  Pt is a 83 y.o. male seen today for medical management of chronic diseases.  He has PMH of BPH, dementia, spinal stenosis, GERD, major depression and intracranial and intraspinal phlebitis and thrombophlebitis.  He was seen in his room today. He is alert and oriented X 3 . BCAT done on March 26 showed that he scored 4741 which is diagnostic of mild cognitive deficit.    Past Medical History:  Diagnosis Date  . Abdominal pain    other specified site  . Abnormal gait   . Advance directive in chart  10/12/2015  . Anemia   . Arthritis   . BPH (benign prostatic hyperplasia)   . Cataract   . Cervical spondylosis without myelopathy   . Cognitive dysfunction   . DDD (degenerative disc disease), cervical 06/29/2013  . Degeneration of cervical intervertebral disc   . Degeneration of lumbar intervertebral disc   . Depression   . Excessive daytime sleepiness   . Generalized osteoarthritis of multiple sites 11/19/2013  . GERD (gastroesophageal reflux disease)   . Hallux varus, acquired   . Hiatal hernia   . Hyperlipidemia   . Hypertension   . Hypogonadism in male   . Impaired functional mobility, balance, gait, and endurance 11/14/2015   uses motorized wheelchair  . Insomnia 06/29/2013  . Low back pain   . Lumbosacral spondylosis without myelopathy   . Memory loss   . Mild cognitive impairment   . Neurogenic bladder   . OP (osteoporosis)   . Organic sleep apnea   . Osteoporosis 03/15/2004  . Peripheral neuropathy   . Radial nerve palsy   . Scoliosis 06/29/2013  . Seizure (HCC) 07/31/2016  . Sleep apnea    CPAP to bring DOS sleep study done in [place]  . Spinal compression fracture (HCC)   . Spinal stenosis   . Spondylolisthesis, acquired   . Urinary incontinence, stress, male 08/10/2015  . Urinary urgency   . Uses hearing aid 10/12/2015  . Venous stasis   . Venous stasis dermatitis of both lower extremities 06/29/2013  .  Vision abnormalities   . Vitamin B12 deficiency 09/27/2016  . Wrist drop, bilateral    Past Surgical History:  Procedure Laterality Date  . CARPAL TUNNEL RELEASE Left 03/16/2016   Dr. Kennedy Bucker  . CERVICAL SPINE SURGERY    . CORNEAL EYE SURGERY Right 09/08/2014   PTK (Dr. Chales Abrahams)  . HEMORRHOID SURGERY    . HERNIA REPAIR     left side  . INTRATHECAL PUMP IMPLANTATION  04/28/2014   Procedure: REVISION INTRATHECAL CATH; Surgeon: Burman Blacksmith, MD; Location: [facility] OR; Service: Anes/ Pain Mgmt; Laterality: N/A;  . JOINT REPLACEMENT  Bilateral    bilateral knee replacements  . LUMBAR SPINE SURGERY    . PAIN PUMP IMPLANTATION     multiple procedures for removal and insertion this is his 3rd pain pump  . PAIN PUMP REMOVAL     multiple procedures for removal and insertion this is his 3rd pain pump  . Proleive System Treatment  2006  . ROTATOR CUFF REPAIR Right    x2  . SPINAL CORD STIMULATOR IMPLANT     x2  . THORACIC SPINE SURGERY    . TONSILLECTOMY      Allergies  Allergen Reactions  . Amitriptyline Rash  . Nortriptyline Other (See Comments) and Rash    GI  upset    Outpatient Encounter Medications as of 04/17/2018  Medication Sig  . acetaminophen (TYLENOL) 325 MG tablet Take 975 mg by mouth every 8 (eight) hours as needed.  [name] Kitchen alendronate (FOSAMAX) 70 MG tablet Take 70 mg by mouth once a week. On Thursday  . alum & mag hydroxide-simeth (MAALOX PLUS) 400-400-40 MG/5ML suspension Give 30 ml by mouth daily with meals as needed  . ammonium lactate (LAC-HYDRIN) 12 % lotion Apply 1 application topically 2 (two) times daily as needed.   . calcium-vitamin D (OSCAL WITH D) 500-200 MG-UNIT tablet Take 1 tablet by mouth 2 (two) times daily.   . carbamide peroxide (DEBROX) 6.5 % OTIC solution 5 drops 2 (two) times daily.  . diclofenac sodium (VOLTAREN) 1 % GEL Apply 2 g topically 3 (three) times daily as needed. Apply to Bilateral shoulders  . DULoxetine (CYMBALTA) 30 MG capsule Take 30 mg by mouth at bedtime.  . gabapentin (NEURONTIN) 800 MG tablet Take 800 mg by mouth 3 (three) times daily.  . Glycerin-Hypromellose-PEG 400 (VISINE TEARS) 0.2-0.2-1 % SOLN Place 1 drop into both eyes See admin instructions. BID scheduled and TID PRN  . mirabegron ER (MYRBETRIQ) 50 MG TB24 tablet Take 50 mg by mouth at bedtime.  . naloxegol oxalate (MOVANTIK) 12.5 MG TABS tablet Take 12.5 mg by mouth every morning.  . [EXPIRED] nystatin (MYCOSTATIN) 100000 UNIT/ML suspension Take 5 mLs by mouth 4 (four) times daily. Swish and swallow  .  Ostomy Supplies (SKIN PREP WIPES) MISC Apply topically to toes every night at bedtime  . polyethylene glycol (MIRALAX / GLYCOLAX) packet Take 17 g by mouth daily as needed.   . Skin Protectants, Misc. (EUCERIN) cream Wash feet with soap and water, dry. Apply Eucerin cream to dry, scaly skin daily, prn  . tamsulosin (FLOMAX) 0.4 MG CAPS capsule Take 0.4 mg by mouth daily.   [name] Kitchen UNABLE TO FIND Regular diet, small portions.   Aspiration precautions  . vitamin C (ASCORBIC ACID) 500 MG tablet Take 500 mg by mouth daily.   . [DISCONTINUED] omeprazole (PRILOSEC) 20 MG capsule Take 20 mg by mouth daily.   No facility-administered encounter medications on file as of 04/17/2018.  Review of Systems  GENERAL: No change in appetite, no fatigue, no weight changes, no fever, chills or weakness MOUTH and THROAT: Denies oral discomfort, gingival pain or bleeding, pain from teeth or hoarseness   RESPIRATORY: no cough, SOB, DOE, wheezing, hemoptysis CARDIAC: No chest pain, edema or palpitations GI: No abdominal pain, diarrhea, constipation, heart burn, nausea or vomiting GU: Denies dysuria, frequency, hematuria, incontinence, or discharge NEUROLOGICAL: Denies dizziness, syncope, numbness, or headache PSYCHIATRIC: Denies feelings of depression or anxiety. No report of hallucinations, insomnia, paranoia, or agitation   Immunization History  Administered Date(s) Administered  . Influenza Split 09/27/2012  . Influenza, High Dose Seasonal PF 10/04/2016  . Influenza-Unspecified 10/05/2013, 10/04/2014, 09/09/2015, 10/04/2016, 11/01/2017  . PPD Test 03/29/2017  . Pneumococcal Conjugate-13 07/29/2013  . Tdap 05/31/2008  . Zoster 01/18/2006  . Zoster Recombinat (Shingrix) 05/30/2016, 10/19/2016   Pertinent  Health Maintenance Due  Topic Date Due  . PNA vac Low Risk Adult (2 of 2 - PPSV23) 05/19/2018 (Originally 07/30/2014)  . INFLUENZA VACCINE  08/16/2018   No flowsheet data found.   Vitals:   04/17/18  0848  BP: 121/84  Pulse: 85  Resp: 18  Temp: 98.1 F (36.7 C)  SpO2: 100%  Weight: 186 lb 1.6 oz (84.4 kg)  Height: 6\' 1"  (1.854 m)   Body mass index is 24.55 kg/m.  Physical Exam  GENERAL APPEARANCE: Well nourished. In no acute distress. Normal body habitus SKIN:  Skin is warm and dry.  MOUTH and THROAT: Lips are without lesions. Tongue with whitish coating RESPIRATORY: Breathing is even & unlabored, BS CTAB CARDIAC: RRR, no murmur,no extra heart sounds, no edema GI: Abdomen soft, normal BS, no masses, no tenderness NEUROLOGICAL: There is no tremor. Speech is clear. Alert and oriented X 3. PSYCHIATRIC:  Affect and behavior are appropriate  Labs reviewed: Recent Labs    08/20/17 1414 10/15/17 0630 11/19/17 0612  NA  --  140 140  K  --  4.2 4.2  CL  --  101 101  CO2  --  32 32  GLUCOSE  --  103* 105*  BUN  --  24* 24*  CREATININE 1.00 0.91 0.91  CALCIUM  --  9.0 9.0  MG  --  2.4  --    Recent Labs    10/15/17 0630 11/19/17 0612  AST 30 21  ALT 19 20  ALKPHOS 83 84  BILITOT 1.0 1.0  PROT 6.2* 6.3*  ALBUMIN 3.6 3.6   Recent Labs    09/17/17 0640 11/19/17 0612 04/02/18 0545  WBC 3.2* 3.7* 3.6*  NEUTROABS 1.5  --  1.6*  HGB 11.6* 12.3* 11.9*  HCT 34.1* 37.8* 36.8*  MCV 91.5 93.8 95.6  PLT 133* 135* 133*   Lab Results  Component Value Date   TSH 2.870 10/15/2017   Lab Results  Component Value Date   HGBA1C 6.1 (H) 11/19/2017   Lab Results  Component Value Date   CHOL 158 11/19/2017   HDL 61 11/19/2017   LDLCALC 88 11/19/2017   TRIG 47 11/19/2017   CHOLHDL 2.6 11/19/2017     Assessment/Plan  1. Oral candida - continues to have whitish coating on her tongue, continue nystatin 100 unit/mL 5 mL 4 times a day till 04/24/18 - oral care BID  2. Chronic pain syndrome - follows up at [facility] pain clinic, has abdominal pain pump, who is now in  3. Neuropathy -Continue gabapentin 800 mg 1 tab 3 times a day  4. Chronic constipation -Continue  Movantik 12.5 mg 1 tab daily  5. BPH with obstruction/lower urinary tract symptoms -Denies having urinary retention, continue tamsulosin 0.4 mg 1 capsule daily  6. Dry eyes -Denies having eye pain, continue Visine tears 1 drop to both eyes twice a day and as needed  7.  Mild cognitive impairment -Recent BCAT score was 41, continue supportive care, fall precautions   Family/ staff Communication: Discussed plan of care with resident.  Labs/tests ordered: None  Goals of care:   Long-term care   Kenard Gower, NP [facility] [phone] (Monday-Friday 8:00 a.m. - 5:00 p.m.) [phone] (after hours)

## 2018-05-16 ENCOUNTER — Encounter
Admission: RE | Admit: 2018-05-16 | Discharge: 2018-05-16 | Disposition: A | Discharge status: Home / Self Care | Payer: Medicare HMO | Source: Ambulatory Visit | Attending: Internal Medicine | Admitting: Internal Medicine

## 2018-05-19 ENCOUNTER — Non-Acute Institutional Stay (SKILLED_NURSING_FACILITY): Payer: Medicare HMO | Admitting: Adult Health

## 2018-05-19 ENCOUNTER — Encounter: Payer: Self-pay | Admitting: Adult Health

## 2018-05-19 DIAGNOSIS — G894 Chronic pain syndrome: Secondary | ICD-10-CM | POA: Diagnosis not present

## 2018-05-19 DIAGNOSIS — T402X5A Adverse effect of other opioids, initial encounter: Secondary | ICD-10-CM

## 2018-05-19 DIAGNOSIS — K5903 Drug induced constipation: Secondary | ICD-10-CM | POA: Diagnosis not present

## 2018-05-19 DIAGNOSIS — N401 Enlarged prostate with lower urinary tract symptoms: Secondary | ICD-10-CM | POA: Diagnosis not present

## 2018-05-19 DIAGNOSIS — H04123 Dry eye syndrome of bilateral lacrimal glands: Secondary | ICD-10-CM

## 2018-05-19 DIAGNOSIS — N138 Other obstructive and reflux uropathy: Secondary | ICD-10-CM

## 2018-05-19 DIAGNOSIS — M810 Age-related osteoporosis without current pathological fracture: Secondary | ICD-10-CM

## 2018-05-19 NOTE — Progress Notes (Signed)
Location:  [facility] Room Number: 305-A Place of Service:  SNF ([phone]) Provider:  Kenard Gower, DNP  Patient Care Team: Orene Desanctis, MD as PCP - General (Pediatrics) Verner Mould, MD (Family Medicine) Chilton Si Chong Sicilian, NP as Nurse Practitioner (Geriatric Medicine)  Extended Emergency Contact Information Primary Emergency Contact: [facility] Address: [address]          [place], [place] [address] [place] Home Phone: [phone] Mobile Phone: [phone] Relation: Daughter Secondary Emergency Contact: Forde Radon Address: [address]          [place], [place] [address] [place] Home Phone: [phone] Relation: Spouse  Code Status:  DNR  Goals of care: Advanced Directive information Advanced Directives 05/19/2018  Does Patient Have a Medical Advance Directive? Yes  Type of Advance Directive Out of facility DNR (pink MOST or yellow form)  Does patient want to make changes to medical advance directive? No - Patient declined  Copy of Healthcare Power of Attorney in Chart? -  Would patient like information on creating a medical advance directive? -  Pre-existing out of facility DNR order (yellow form or pink MOST form) -     Chief Complaint  Patient presents with  . Medical Management of Chronic Issues    Routine [facility] visit    HPI:  Pt is a 83 y.o. male seen today for medical management of chronic diseas es.  He is a long-term care resident of [facility]	[place]. He has a PMH of BPH, dementia, spinal stenosis, GERD, major depression, and intracranial and intraspinal phlebitis and thrombophlebitis. He was seen in the room today. He verbalized that he is having less sleep due to frequent urination. No reported fever.    Past Medical History:  Diagnosis Date  . Abdominal pain    other specified site  . Abnormal gait   . Advance directive in chart 10/12/2015  . Anemia   . Arthritis   . BPH  (benign prostatic hyperplasia)   . Cataract   . Cervical spondylosis without myelopathy   . Cognitive dysfunction   . DDD (degenerative disc disease), cervical 06/29/2013  . Degeneration of cervical intervertebral disc   . Degeneration of lumbar intervertebral disc   . Depression   . Excessive daytime sleepiness   . Generalized osteoarthritis of multiple sites 11/19/2013  . GERD (gastroesophageal reflux disease)   . Hallux varus, acquired   . Hiatal hernia   . Hyperlipidemia   . Hypertension   . Hypogonadism in male   . Impaired functional mobility, balance, gait, and endurance 11/14/2015   uses motorized wheelchair  . Insomnia 06/29/2013  . Low back pain   . Lumbosacral spondylosis without myelopathy   . Memory loss   . Mild cognitive impairment   . Neurogenic bladder   . OP (osteoporosis)   . Organic sleep apnea   . Osteoporosis 03/15/2004  . Peripheral neuropathy   . Radial nerve palsy   . Scoliosis 06/29/2013  . Seizure (HCC) 07/31/2016  . Sleep apnea    CPAP to bring DOS sleep study done in [place]  . Spinal compression fracture (HCC)   . Spinal stenosis   . Spondylolisthesis, acquired   . Urinary incontinence, stress, male 08/10/2015  . Urinary urgency   . Uses hearing aid 10/12/2015  . Venous stasis   . Venous stasis dermatitis of both lower extremities 06/29/2013  . Vision abnormalities   . Vitamin B12 deficiency 09/27/2016  .  Wrist drop, bilateral    Past Surgical History:  Procedure Laterality Date  . CARPAL TUNNEL RELEASE Left 03/16/2016   Dr. Kennedy BuckerMichael Menz  . CERVICAL SPINE SURGERY    . CORNEAL EYE SURGERY Right 09/08/2014   PTK (Dr. Chales AbrahamsGupta)  . HEMORRHOID SURGERY    . HERNIA REPAIR     left side  . INTRATHECAL PUMP IMPLANTATION  04/28/2014   Procedure: REVISION INTRATHECAL CATH; Surgeon: Burman Blacksmithichard Levi Boortz-Marx, MD; Location: [facility] OR; Service: Anes/ Pain Mgmt; Laterality: N/A;  . JOINT REPLACEMENT Bilateral    bilateral knee replacements  .  LUMBAR SPINE SURGERY    . PAIN PUMP IMPLANTATION     multiple procedures for removal and insertion this is his 3rd pain pump  . PAIN PUMP REMOVAL     multiple procedures for removal and insertion this is his 3rd pain pump  . Proleive System Treatment  2006  . ROTATOR CUFF REPAIR Right    x2  . SPINAL CORD STIMULATOR IMPLANT     x2  . THORACIC SPINE SURGERY    . TONSILLECTOMY      Allergies  Allergen Reactions  . Amitriptyline Rash  . Nortriptyline Other (See Comments) and Rash    GI  upset    Outpatient Encounter Medications as of 05/19/2018  Medication Sig  . acetaminophen (TYLENOL) 325 MG tablet Take 975 mg by mouth every 8 (eight) hours as needed.  [name] Kitchen. alendronate (FOSAMAX) 70 MG tablet Take 70 mg by mouth once a week. On Thursday  . alum & mag hydroxide-simeth (MAALOX PLUS) 400-400-40 MG/5ML suspension Give 30 ml by mouth daily with meals as needed  . ammonium lactate (LAC-HYDRIN) 12 % lotion Apply 1 application topically 2 (two) times daily as needed.   . Artificial Saliva (BIOTENE ORALBALANCE DRY MOUTH) GEL Use as directed 1 application in the mouth or throat daily as needed. Apply a pea size amount to mucous membrane for dry mouth  . calcium-vitamin D (OSCAL WITH D) 500-200 MG-UNIT tablet Take 1 tablet by mouth 2 (two) times daily.   . carbamide peroxide (DEBROX) 6.5 % OTIC solution 5 drops 2 (two) times daily.  . diclofenac sodium (VOLTAREN) 1 % GEL Apply 2 g topically 3 (three) times daily as needed. Apply to Bilateral shoulders  . DULoxetine (CYMBALTA) 30 MG capsule Take 30 mg by mouth at bedtime.  . gabapentin (NEURONTIN) 800 MG tablet Take 800 mg by mouth 3 (three) times daily.  . Glycerin-Hypromellose-PEG 400 (VISINE TEARS) 0.2-0.2-1 % SOLN Place 1 drop into both eyes See admin instructions. BID scheduled and TID PRN  . Magnesium 250 MG TABS Take 1 tablet by mouth daily.  . mirabegron ER (MYRBETRIQ) 50 MG TB24 tablet Take 50 mg by mouth at bedtime.  . naloxegol oxalate  (MOVANTIK) 12.5 MG TABS tablet Take 12.5 mg by mouth every morning.  . Ostomy Supplies (SKIN PREP WIPES) MISC Apply topically to toes every night at bedtime  . polyethylene glycol (MIRALAX / GLYCOLAX) packet Take 17 g by mouth daily as needed.   . Skin Protectants, Misc. (EUCERIN) cream Wash feet with soap and water, dry. Apply Eucerin cream to dry, scaly skin daily, prn  . tamsulosin (FLOMAX) 0.4 MG CAPS capsule Take 0.4 mg by mouth daily.   [name] Kitchen. UNABLE TO FIND Regular diet, small portions.   Aspiration precautions  . vitamin C (ASCORBIC ACID) 500 MG tablet Take 500 mg by mouth daily.    No facility-administered encounter medications on file as of 05/19/2018.  Review of Systems  GENERAL: No change in appetite, no fatigue, no weight changes, no fever, chills or weakness MOUTH and THROAT: Denies oral discomfort, gingival pain or bleeding, pain from teeth or hoarseness   RESPIRATORY: no cough, SOB, DOE, wheezing, hemoptysis CARDIAC: No chest pain, edema or palpitations GI: No abdominal pain, diarrhea, constipation, heart burn, nausea or vomiting GU:+ increased frequency in urination NEUROLOGICAL: Denies dizziness, syncope, numbness, or headache PSYCHIATRIC: Denies feelings of depression or anxiety. No report of hallucinations, insomnia, paranoia, or agitation    Immunization History  Administered Date(s) Administered  . Influenza Split 09/27/2012  . Influenza, High Dose Seasonal PF 10/04/2016  . Influenza-Unspecified 10/05/2013, 10/04/2014, 09/09/2015, 10/04/2016, 11/01/2017  . PPD Test 03/29/2017  . Pneumococcal Conjugate-13 07/29/2013  . Tdap 05/31/2008  . Zoster 01/18/2006  . Zoster Recombinat (Shingrix) 05/30/2016, 10/19/2016   Pertinent  Health Maintenance Due  Topic Date Due  . PNA vac Low Risk Adult (2 of 2 - PPSV23) 05/19/2018 (Originally 07/30/2014)  . INFLUENZA VACCINE  08/16/2018    Vitals:   05/19/18 1607  BP: 114/67  Pulse: 66  Resp: 18  Temp: (!) 97.4 F (36.3  C)  TempSrc: Oral  SpO2: 96%  Weight: 186 lb 3.2 oz (84.5 kg)  Height: 6\' 1"  (1.854 m)   Body mass index is 24.57 kg/m.  Physical Exam  GENERAL APPEARANCE: Well nourished. In no acute distress. Normal body habitus SKIN:  Skin is warm and dry.  MOUTH and THROAT: Lips are without lesions. Oral mucosa is moist and without lesions.  RESPIRATORY: Breathing is even & unlabored, BS CTAB CARDIAC: RRR, no murmur,no extra heart sounds, no edema GI: Abdomen soft, normal BS, no masses, no tenderness EXTREMITIES:  Able to move X 4 extremities NEUROLOGICAL: There is no tremor. Speech is clear. Alert and oriented X 3. PSYCHIATRIC:  Affect and behavior are appropriate  Labs reviewed: Recent Labs    08/20/17 1414 10/15/17 0630 11/19/17 0612  NA  --  140 140  K  --  4.2 4.2  CL  --  101 101  CO2  --  32 32  GLUCOSE  --  103* 105*  BUN  --  24* 24*  CREATININE 1.00 0.91 0.91  CALCIUM  --  9.0 9.0  MG  --  2.4  --    Recent Labs    10/15/17 0630 11/19/17 0612  AST 30 21  ALT 19 20  ALKPHOS 83 84  BILITOT 1.0 1.0  PROT 6.2* 6.3*  ALBUMIN 3.6 3.6   Recent Labs    09/17/17 0640 11/19/17 0612 04/02/18 0545  WBC 3.2* 3.7* 3.6*  NEUTROABS 1.5  --  1.6*  HGB 11.6* 12.3* 11.9*  HCT 34.1* 37.8* 36.8*  MCV 91.5 93.8 95.6  PLT 133* 135* 133*   Lab Results  Component Value Date   TSH 2.870 10/15/2017   Lab Results  Component Value Date   HGBA1C 6.1 (H) 11/19/2017   Lab Results  Component Value Date   CHOL 158 11/19/2017   HDL 61 11/19/2017   LDLCALC 88 11/19/2017   TRIG 47 11/19/2017   CHOLHDL 2.6 11/19/2017     Assessment/Plan  1. BPH with obstruction/lower urinary tract symptoms - has frequent urination at night affecting his sleep, will continue Flomax 0.4 mg 1 capsule daily, will check for UTI  2. Chronic pain syndrome -Well-controlled, continue gabapentin 800 mg 1 tablet 3 times a day, duloxetine DR 30 mg 1 capsule at bedtime  3. Constipation due to  opioid  therapy -Continue Movantik 12.5 mg 1 tablet daily and MiraLAX 17 g daily  4. Dry eyes -Denies eye pain, continue Visine tears twice a day and as needed  5. Osteoporosis, unspecified osteoporosis type, unspecified pathological fracture presence -No recent fracture, continue alendronate 70 mg 1 tablet weekly, on Thursdays   Family/ staff Communication: Discussed plan of care with resident.  Labs/tests ordered: Urinalysis with culture and sensitivity  Goals of care:   Long-term care.   Kenard Gower, DNP [facility] [phone] (Monday-Friday 8:00 a.m. - 5:00 p.m.) [phone] (after hours)

## 2018-05-20 ENCOUNTER — Other Ambulatory Visit
Admission: RE | Admit: 2018-05-20 | Discharge: 2018-05-20 | Disposition: A | Discharge status: Home / Self Care | Payer: Medicare HMO | Source: Skilled Nursing Facility | Attending: Adult Health | Admitting: Adult Health

## 2018-05-20 DIAGNOSIS — R350 Frequency of micturition: Secondary | ICD-10-CM | POA: Insufficient documentation

## 2018-05-20 LAB — URINALYSIS, COMPLETE (UACMP) WITH MICROSCOPIC
Bacteria, UA: NONE SEEN
Bilirubin Urine: NEGATIVE
Glucose, UA: NEGATIVE mg/dL
Hgb urine dipstick: NEGATIVE
Ketones, ur: NEGATIVE mg/dL
Leukocytes,Ua: NEGATIVE
Nitrite: NEGATIVE
Protein, ur: NEGATIVE mg/dL
Specific Gravity, Urine: 1.01 (ref 1.005–1.030)
Squamous Epithelial / LPF: NONE SEEN (ref 0–5)
WBC, UA: NONE SEEN WBC/hpf (ref 0–5)
pH: 7 (ref 5.0–8.0)

## 2018-05-21 LAB — URINE CULTURE: Culture: NO GROWTH

## 2018-06-05 ENCOUNTER — Non-Acute Institutional Stay (SKILLED_NURSING_FACILITY): Payer: Medicare HMO | Admitting: Adult Health

## 2018-06-05 ENCOUNTER — Encounter: Payer: Self-pay | Admitting: Adult Health

## 2018-06-05 DIAGNOSIS — M810 Age-related osteoporosis without current pathological fracture: Secondary | ICD-10-CM

## 2018-06-05 DIAGNOSIS — N401 Enlarged prostate with lower urinary tract symptoms: Secondary | ICD-10-CM

## 2018-06-05 DIAGNOSIS — N319 Neuromuscular dysfunction of bladder, unspecified: Secondary | ICD-10-CM | POA: Diagnosis not present

## 2018-06-05 DIAGNOSIS — K5903 Drug induced constipation: Secondary | ICD-10-CM

## 2018-06-05 DIAGNOSIS — T402X5A Adverse effect of other opioids, initial encounter: Secondary | ICD-10-CM

## 2018-06-05 DIAGNOSIS — N138 Other obstructive and reflux uropathy: Secondary | ICD-10-CM

## 2018-06-05 DIAGNOSIS — G894 Chronic pain syndrome: Secondary | ICD-10-CM

## 2018-06-05 NOTE — Progress Notes (Signed)
[facility]:  [facility] Room Number: 03/13/17 Place of Service:  SNF (31) Provider:  Kenard Gower, DNP, FNP-BC  Patient Care Team: Orene Desanctis, MD as PCP - General (Pediatrics) Verner Mould, MD (Family Medicine) Chilton Si Chong Sicilian, NP as Nurse Practitioner (Geriatric Medicine)  Extended Emergency Contact Information Primary Emergency Contact: [facility] Address: [address]          [place], [place] [address] [place] Home Phone: [phone] Mobile Phone: [phone] Relation: Daughter Secondary Emergency Contact: Forde Radon Address: [address]          [place], [place] [address] [place] Home Phone: [phone] Relation: Spouse  Code Status:  DNR  Goals of care: Advanced Directive information Advanced Directives 05/19/2018  Does Patient Have a Medical Advance Directive? Yes  Type of Advance Directive Out of facility DNR (pink MOST or yellow form)  Does patient want to make changes to medical advance directive? No - Patient declined  Copy of Healthcare Power of Attorney in Chart? -  Would patient like information on creating a medical advance directive? -  Pre-existing out of facility DNR order (yellow form or pink MOST form) -     Chief Complaint  Patient presents with  . Discharge Note    Patient to transfer to [facility]F on 06/11/18.    HPI:  Pt is an 83 y.o. male seen today for a discharge visit.  He is to discharge to [facility] on 06/11/18 with OT and PT services.     He has been admitted to [facility] on 03/13/17 for a short-term rehabilitation. He has a PMH of BPH, dementia, spinal stenosis, GERD, major depression, abd intracranial/intraspinal phlebitis and thrombophlebitis.    Past Medical History:  Diagnosis Date  . Abdominal pain    other specified site  . Abnormal gait   . Advance directive in chart 10/12/2015  . Anemia   . Arthritis   . BPH (benign prostatic hyperplasia)   . Cataract    . Cervical spondylosis without myelopathy   . Cognitive dysfunction   . DDD (degenerative disc disease), cervical 06/29/2013  . Degeneration of cervical intervertebral disc   . Degeneration of lumbar intervertebral disc   . Depression   . Excessive daytime sleepiness   . Generalized osteoarthritis of multiple sites 11/19/2013  . GERD (gastroesophageal reflux disease)   . Hallux varus, acquired   . Hiatal hernia   . Hyperlipidemia   . Hypertension   . Hypogonadism in male   . Impaired functional mobility, balance, gait, and endurance 11/14/2015   uses motorized wheelchair  . Insomnia 06/29/2013  . Low back pain   . Lumbosacral spondylosis without myelopathy   . Memory loss   . Mild cognitive impairment   . Neurogenic bladder   . OP (osteoporosis)   . Organic sleep apnea   . Osteoporosis 03/15/2004  . Peripheral neuropathy   . Radial nerve palsy   . Scoliosis 06/29/2013  . Seizure (HCC) 07/31/2016  . Sleep apnea    CPAP to bring DOS sleep study done in [place]  . Spinal compression fracture (HCC)   . Spinal stenosis   . Spondylolisthesis, acquired   . Urinary incontinence, stress, male 08/10/2015  . Urinary urgency   . Uses hearing aid 10/12/2015  . Venous stasis   . Venous stasis dermatitis of both lower extremities 06/29/2013  . Vision abnormalities   . Vitamin B12 deficiency 09/27/2016  . Wrist drop, bilateral  Past Surgical History:  Procedure Laterality Date  . CARPAL TUNNEL RELEASE Left 03/16/2016   Dr. Kennedy BuckerMichael Menz  . CERVICAL SPINE SURGERY    . CORNEAL EYE SURGERY Right 09/08/2014   PTK (Dr. Chales AbrahamsGupta)  . HEMORRHOID SURGERY    . HERNIA REPAIR     left side  . INTRATHECAL PUMP IMPLANTATION  04/28/2014   Procedure: REVISION INTRATHECAL CATH; Surgeon: Burman Blacksmithichard Levi Boortz-Marx, MD; Location: [facility] OR; Service: Anes/ Pain Mgmt; Laterality: N/A;  . JOINT REPLACEMENT Bilateral    bilateral knee replacements  . LUMBAR SPINE SURGERY    . PAIN PUMP  IMPLANTATION     multiple procedures for removal and insertion this is his 3rd pain pump  . PAIN PUMP REMOVAL     multiple procedures for removal and insertion this is his 3rd pain pump  . Proleive System Treatment  2006  . ROTATOR CUFF REPAIR Right    x2  . SPINAL CORD STIMULATOR IMPLANT     x2  . THORACIC SPINE SURGERY    . TONSILLECTOMY      Allergies  Allergen Reactions  . Amitriptyline Rash  . Nortriptyline Other (See Comments) and Rash    GI  upset    Outpatient Encounter Medications as of 06/05/2018  Medication Sig  . acetaminophen (TYLENOL) 325 MG tablet Take 975 mg by mouth every 8 (eight) hours as needed.  [name] Kitchen. alendronate (FOSAMAX) 70 MG tablet Take 70 mg by mouth once a week. On Thursday  . alum & mag hydroxide-simeth (MAALOX PLUS) 400-400-40 MG/5ML suspension Give 30 ml by mouth daily with meals as needed  . ammonium lactate (LAC-HYDRIN) 12 % lotion Apply 1 application topically 2 (two) times daily as needed.   . Artificial Saliva (BIOTENE ORALBALANCE DRY MOUTH) GEL Use as directed 1 application in the mouth or throat daily as needed. Apply a pea size amount to mucous membrane for dry mouth  . calcium-vitamin D (OSCAL WITH D) 500-200 MG-UNIT tablet Take 1 tablet by mouth 2 (two) times daily.   . carbamide peroxide (DEBROX) 6.5 % OTIC solution 5 drops 2 (two) times daily.  . diclofenac sodium (VOLTAREN) 1 % GEL Apply 2 g topically 3 (three) times daily as needed. Apply to Bilateral shoulders  . DULoxetine (CYMBALTA) 30 MG capsule Take 30 mg by mouth at bedtime.  . gabapentin (NEURONTIN) 800 MG tablet Take 800 mg by mouth 3 (three) times daily.  . Glycerin-Hypromellose-PEG 400 (VISINE TEARS) 0.2-0.2-1 % SOLN Place 1 drop into both eyes See admin instructions. BID scheduled and TID PRN  . Magnesium 250 MG TABS Take 1 tablet by mouth daily.  . mirabegron ER (MYRBETRIQ) 50 MG TB24 tablet Take 50 mg by mouth at bedtime.  . naloxegol oxalate (MOVANTIK) 12.5 MG TABS tablet Take  12.5 mg by mouth every morning.  . Ostomy Supplies (SKIN PREP WIPES) MISC Apply topically to toes every night at bedtime  . polyethylene glycol (MIRALAX / GLYCOLAX) packet Take 17 g by mouth daily as needed.   . Skin Protectants, Misc. (EUCERIN) cream Wash feet with soap and water, dry. Apply Eucerin cream to dry, scaly skin daily, prn  . tamsulosin (FLOMAX) 0.4 MG CAPS capsule Take 0.4 mg by mouth daily.   [name] Kitchen. UNABLE TO FIND Regular diet, small portions.   Aspiration precautions  . vitamin C (ASCORBIC ACID) 500 MG tablet Take 500 mg by mouth daily.    No facility-administered encounter medications on file as of 06/05/2018.     Review of Systems  GENERAL: No change in appetite, no fatigue, no weight changes, no fever, chills or weakness MOUTH and THROAT: Denies oral discomfort, gingival pain or bleeding RESPIRATORY: no cough, SOB, DOE, wheezing, hemoptysis CARDIAC: No chest pain, edema or palpitations GI: No abdominal pain, diarrhea, constipation, heart burn, nausea or vomiting GU: Denies dysuria, frequency, hematuria, incontinence, or discharge NEUROLOGICAL: Denies dizziness, syncope, 04 numbness PSYCHIATRIC: Denies feelings of depression or anxiety. No report of hallucinations, insomnia, paranoia, or agitation    Immunization History  Administered Date(s) Administered  . Influenza Split 09/27/2012  . Influenza, High Dose Seasonal PF 10/04/2016  . Influenza-Unspecified 10/05/2013, 10/04/2014, 09/09/2015, 10/04/2016, 11/01/2017  . PPD Test 03/29/2017  . Pneumococcal Conjugate-13 07/29/2013  . Tdap 05/31/2008  . Zoster 01/18/2006  . Zoster Recombinat (Shingrix) 05/30/2016, 10/19/2016   Pertinent  Health Maintenance Due  Topic Date Due  . PNA vac Low Risk Adult (2 of 2 - PPSV23) 07/30/2014  . INFLUENZA VACCINE  08/16/2018     Physical Exam  GENERAL APPEARANCE: Well nourished. In no acute distress. Normal body habitus SKIN:  Skin is warm and dry. There are no suspicious lesions  or rash MOUTH and THROAT: Lips are without lesions. Oral mucosa is moist and without lesions.  RESPIRATORY: Breathing is even & unlabored, BS CTAB CARDIAC: RRR, no murmur,no extra heart sounds, no edema GI: Abdomen soft, normal BS, no masses, no tenderness, + abdominal pain pump NEUROLOGICAL: There is no tremor. Speech is clear. Alert and oriented X 3. PSYCHIATRIC:  Affect and behavior are appropriate  Labs reviewed: Recent Labs    08/20/17 1414 10/15/17 0630 11/19/17 0612  NA  --  140 140  K  --  4.2 4.2  CL  --  101 101  CO2  --  32 32  GLUCOSE  --  103* 105*  BUN  --  24* 24*  CREATININE 1.00 0.91 0.91  CALCIUM  --  9.0 9.0  MG  --  2.4  --    Recent Labs    10/15/17 0630 11/19/17 0612  AST 30 21  ALT 19 20  ALKPHOS 83 84  BILITOT 1.0 1.0  PROT 6.2* 6.3*  ALBUMIN 3.6 3.6   Recent Labs    09/17/17 0640 11/19/17 0612 04/02/18 0545  WBC 3.2* 3.7* 3.6*  NEUTROABS 1.5  --  1.6*  HGB 11.6* 12.3* 11.9*  HCT 34.1* 37.8* 36.8*  MCV 91.5 93.8 95.6  PLT 133* 135* 133*   Lab Results  Component Value Date   TSH 2.870 10/15/2017   Lab Results  Component Value Date   HGBA1C 6.1 (H) 11/19/2017   Lab Results  Component Value Date   CHOL 158 11/19/2017   HDL 61 11/19/2017   LDLCALC 88 11/19/2017   TRIG 47 11/19/2017   CHOLHDL 2.6 11/19/2017    Assessment/Plan  1. BPH with obstruction/lower urinary tract symptoms -Continue tamsulosin 0.4 mg 1 capsule daily  2. Osteoporosis, unspecified osteoporosis type, unspecified pathological fracture presence -Continue alendronate 70 mg weekly on Thursdays, fall precautions  3. Bladder neurogenesis -Continue Myrbetriq 50 mg at bedtime  4. Constipation due to opioid therapy -Has abdominal pain pump, continue Movantik 12.5 mg daily and MiraLAX 17 g daily  5. Chronic pain syndrome -Has abdominal pain pump, continue gabapentin 800 mg 3 times a day and duloxetine 30 mg at bedtime    I have filled out patient's  discharge paperworks.  Patient will receive home health PT and OT.  DME provided: Semi-electric hospital bed, half rail and  wheelchair  Total discharge time: Greater than 30 minutes Greater than 50% was spent in counseling and coordination of care.   Discharge time involved coordination of the discharge process with social worker, nursing staff and therapy department. Medical justification for home health services/DME verified.   Kenard Gower, DNP, FNP-BC [facility] and Adult Medicine [phone] (Monday-Friday 8:00 a.m. - 5:00 p.m.) [phone] (after hours)

## 2019-12-29 ENCOUNTER — Other Ambulatory Visit: Payer: Self-pay

## 2019-12-29 ENCOUNTER — Encounter: Payer: Self-pay | Admitting: Ophthalmology

## 2019-12-31 ENCOUNTER — Other Ambulatory Visit
Admission: RE | Admit: 2019-12-31 | Discharge: 2019-12-31 | Disposition: A | Discharge status: Home / Self Care | Payer: Medicare HMO | Source: Ambulatory Visit | Attending: Ophthalmology | Admitting: Ophthalmology

## 2019-12-31 ENCOUNTER — Other Ambulatory Visit: Payer: Self-pay

## 2019-12-31 DIAGNOSIS — Z20822 Contact with and (suspected) exposure to covid-19: Secondary | ICD-10-CM | POA: Insufficient documentation

## 2019-12-31 LAB — SARS CORONAVIRUS 2 (TAT 6-24 HRS): SARS Coronavirus 2: NEGATIVE

## 2019-12-31 NOTE — Discharge Instructions (Signed)

## 2020-01-04 ENCOUNTER — Other Ambulatory Visit: Payer: Self-pay

## 2020-01-04 ENCOUNTER — Ambulatory Visit: Payer: Medicare HMO | Admitting: Anesthesiology

## 2020-01-04 ENCOUNTER — Encounter: Payer: Self-pay | Admitting: Ophthalmology

## 2020-01-04 ENCOUNTER — Ambulatory Visit
Admission: RE | Admit: 2020-01-04 | Discharge: 2020-01-04 | Disposition: A | Discharge status: Home / Self Care | Payer: Medicare HMO | Attending: Ophthalmology | Admitting: Ophthalmology

## 2020-01-04 ENCOUNTER — Encounter
Admission: RE | Disposition: A | Discharge status: Home / Self Care | Payer: Self-pay | Source: Home / Self Care | Attending: Ophthalmology

## 2020-01-04 DIAGNOSIS — H2181 Floppy iris syndrome: Secondary | ICD-10-CM | POA: Insufficient documentation

## 2020-01-04 DIAGNOSIS — Z8249 Family history of ischemic heart disease and other diseases of the circulatory system: Secondary | ICD-10-CM | POA: Diagnosis not present

## 2020-01-04 DIAGNOSIS — K219 Gastro-esophageal reflux disease without esophagitis: Secondary | ICD-10-CM | POA: Diagnosis not present

## 2020-01-04 DIAGNOSIS — Z833 Family history of diabetes mellitus: Secondary | ICD-10-CM | POA: Insufficient documentation

## 2020-01-04 DIAGNOSIS — I10 Essential (primary) hypertension: Secondary | ICD-10-CM | POA: Diagnosis not present

## 2020-01-04 DIAGNOSIS — H2511 Age-related nuclear cataract, right eye: Secondary | ICD-10-CM | POA: Insufficient documentation

## 2020-01-04 DIAGNOSIS — Z823 Family history of stroke: Secondary | ICD-10-CM | POA: Diagnosis not present

## 2020-01-04 DIAGNOSIS — Z811 Family history of alcohol abuse and dependence: Secondary | ICD-10-CM | POA: Diagnosis not present

## 2020-01-04 DIAGNOSIS — Z79899 Other long term (current) drug therapy: Secondary | ICD-10-CM | POA: Insufficient documentation

## 2020-01-04 DIAGNOSIS — Z825 Family history of asthma and other chronic lower respiratory diseases: Secondary | ICD-10-CM | POA: Insufficient documentation

## 2020-01-04 DIAGNOSIS — Z8261 Family history of arthritis: Secondary | ICD-10-CM | POA: Diagnosis not present

## 2020-01-04 DIAGNOSIS — Z9109 Other allergy status, other than to drugs and biological substances: Secondary | ICD-10-CM | POA: Diagnosis not present

## 2020-01-04 DIAGNOSIS — Z8673 Personal history of transient ischemic attack (TIA), and cerebral infarction without residual deficits: Secondary | ICD-10-CM | POA: Insufficient documentation

## 2020-01-04 DIAGNOSIS — Z820 Family history of epilepsy and other diseases of the nervous system: Secondary | ICD-10-CM | POA: Diagnosis not present

## 2020-01-04 HISTORY — DX: Dependence on wheelchair: Z99.3

## 2020-01-04 HISTORY — PX: CATARACT EXTRACTION W/PHACO: SHX586

## 2020-01-04 SURGERY — PHACOEMULSIFICATION, CATARACT, WITH IOL INSERTION
Anesthesia: Monitor Anesthesia Care | Site: Eye | Laterality: Right

## 2020-01-04 MED ORDER — LACTATED RINGERS IV SOLN: INTRAVENOUS | Status: DC

## 2020-01-04 MED ORDER — TETRACAINE HCL 0.5 % OP SOLN
1.0000 [drp] | OPHTHALMIC | Status: DC | PRN
  Administered 2020-01-04 (×3): 1 [drp] via OPHTHALMIC

## 2020-01-04 MED ORDER — BRIMONIDINE TARTRATE-TIMOLOL 0.2-0.5 % OP SOLN
OPHTHALMIC | Status: DC | PRN
  Administered 2020-01-04: 1 [drp] via OPHTHALMIC

## 2020-01-04 MED ORDER — MIDAZOLAM HCL 2 MG/2ML IJ SOLN
INTRAMUSCULAR | Status: DC | PRN
  Administered 2020-01-04: 1 mg via INTRAVENOUS

## 2020-01-04 MED ORDER — FENTANYL CITRATE (PF) 100 MCG/2ML IJ SOLN
INTRAMUSCULAR | Status: DC | PRN
  Administered 2020-01-04: 50 ug via INTRAVENOUS

## 2020-01-04 MED ORDER — ARMC OPHTHALMIC DILATING DROPS
1.0000 "application " | OPHTHALMIC | Status: DC | PRN
  Administered 2020-01-04 (×3): 1 via OPHTHALMIC

## 2020-01-04 MED ORDER — MOXIFLOXACIN HCL 0.5 % OP SOLN
OPHTHALMIC | Status: DC | PRN
  Administered 2020-01-04: 0.2 mL via OPHTHALMIC

## 2020-01-04 MED ORDER — LIDOCAINE HCL (PF) 2 % IJ SOLN
INTRAOCULAR | Status: DC | PRN
  Administered 2020-01-04: 09:00:00 1 mL via INTRAOCULAR

## 2020-01-04 MED ORDER — SODIUM HYALURONATE 23 MG/ML IO SOLN
INTRAOCULAR | Status: DC | PRN
  Administered 2020-01-04: 0.6 mL via INTRAOCULAR

## 2020-01-04 MED ORDER — EPINEPHRINE PF 1 MG/ML IJ SOLN
INTRAOCULAR | Status: DC | PRN
  Administered 2020-01-04: 09:00:00 75 mL via OPHTHALMIC

## 2020-01-04 MED ORDER — SODIUM HYALURONATE 10 MG/ML IO SOLN
INTRAOCULAR | Status: DC | PRN
  Administered 2020-01-04: 0.55 mL via INTRAOCULAR

## 2020-01-04 SURGICAL SUPPLY — 20 items
CANNULA ANT/CHMB 27G (MISCELLANEOUS) ×2 IMPLANT
CANNULA ANT/CHMB 27GA (MISCELLANEOUS) ×6 IMPLANT
DISSECTOR HYDRO NUCLEUS 50X22 (MISCELLANEOUS) ×3 IMPLANT
GLOVE SURG LX 7.5 STRW (GLOVE) ×4
GLOVE SURG LX STRL 7.5 STRW (GLOVE) ×1 IMPLANT
GLOVE SURG SYN 8.5  E (GLOVE) ×2
GLOVE SURG SYN 8.5 E (GLOVE) ×1 IMPLANT
GLOVE SURG SYN 8.5 PF PI (GLOVE) ×1 IMPLANT
GOWN STRL REUS W/ TWL LRG LVL3 (GOWN DISPOSABLE) ×2 IMPLANT
GOWN STRL REUS W/TWL LRG LVL3 (GOWN DISPOSABLE) ×6
LENS IOL TECNIS EYHANCE 9.5 (Intraocular Lens) ×2 IMPLANT
MARKER SKIN DUAL TIP RULER LAB (MISCELLANEOUS) ×3 IMPLANT
PACK DR. KING ARMS (PACKS) ×3 IMPLANT
PACK EYE AFTER SURG (MISCELLANEOUS) ×3 IMPLANT
PACK OPTHALMIC (MISCELLANEOUS) ×3 IMPLANT
RING MALYGIN 7.0 (MISCELLANEOUS) ×2 IMPLANT
SYR 3ML LL SCALE MARK (SYRINGE) ×3 IMPLANT
SYR TB 1ML LUER SLIP (SYRINGE) ×3 IMPLANT
WATER STERILE IRR 250ML POUR (IV SOLUTION) ×3 IMPLANT
WIPE NON LINTING 3.25X3.25 (MISCELLANEOUS) ×3 IMPLANT

## 2020-01-04 NOTE — H&P (Signed)
[facility]   Primary Care Physician:  Patient, No Pcp Per Ophthalmologist: Dr. Willey Blade  Pre-Procedure History & Physical: HPI:  Dustin Turner is a 84 y.o. male here for cataract surgery.   Past Medical History:  Diagnosis Date  . Abdominal pain    other specified site  . Abnormal gait   . Advance directive in chart 10/12/2015  . Anemia   . Arthritis   . BPH (benign prostatic hyperplasia)   . Cataract   . Cervical spondylosis without myelopathy   . Cognitive dysfunction   . DDD (degenerative disc disease), cervical 06/29/2013  . Degeneration of cervical intervertebral disc   . Degeneration of lumbar intervertebral disc   . Depression   . Excessive daytime sleepiness   . Generalized osteoarthritis of multiple sites 11/19/2013  . GERD (gastroesophageal reflux disease)   . Hallux varus, acquired   . Hiatal hernia   . Hyperlipidemia   . Hypertension    (12/30/19 - pt denies)  . Hypogonadism in male   . Impaired functional mobility, balance, gait, and endurance 11/14/2015   uses motorized wheelchair  . Insomnia 06/29/2013  . Low back pain   . Lumbosacral spondylosis without myelopathy   . Memory loss   . Mild cognitive impairment   . Neurogenic bladder   . OP (osteoporosis)   . Organic sleep apnea   . Osteoporosis 03/15/2004  . Peripheral neuropathy   . Radial nerve palsy   . Scoliosis 06/29/2013  . Seizure (HCC) 07/31/2016  . Sleep apnea    CPAP to bring DOS sleep study done in [place]  . Spinal compression fracture (HCC)   . Spinal stenosis   . Spondylolisthesis, acquired   . Urinary incontinence, stress, male 08/10/2015  . Urinary urgency   . Uses hearing aid 10/12/2015   both ears  . Uses wheelchair   . Venous stasis   . Venous stasis dermatitis of both lower extremities 06/29/2013  . Vision abnormalities   . Vitamin B12 deficiency 09/27/2016  . Wrist drop, bilateral     Past Surgical History:  Procedure Laterality Date  . CARPAL TUNNEL  RELEASE Left 03/16/2016   Dr. Kennedy Bucker  . CERVICAL SPINE SURGERY    . CORNEAL EYE SURGERY Right 09/08/2014   PTK (Dr. Chales Abrahams)  . HEMORRHOID SURGERY    . HERNIA REPAIR     left side  . INTRATHECAL PUMP IMPLANTATION  04/28/2014   Procedure: REVISION INTRATHECAL CATH; Surgeon: Burman Blacksmith, MD; Location: [facility] OR; Service: Anes/ Pain Mgmt; Laterality: N/A;  . JOINT REPLACEMENT Bilateral    bilateral knee replacements  . LUMBAR SPINE SURGERY    . PAIN PUMP IMPLANTATION     multiple procedures for removal and insertion this is his 3rd pain pump  . PAIN PUMP REMOVAL     multiple procedures for removal and insertion this is his 3rd pain pump  . Proleive System Treatment  2006  . ROTATOR CUFF REPAIR Right    x2  . SPINAL CORD STIMULATOR IMPLANT     x2  . THORACIC SPINE SURGERY    . TONSILLECTOMY      Prior to Admission medications   Medication Sig Start Date End Date Taking? Authorizing Provider  acetaminophen (TYLENOL) 325 MG tablet Take 975 mg by mouth every 8 (eight) hours as needed.   Yes [provider]  alendronate (FOSAMAX) 70 MG tablet Take 70 mg by mouth once a week. On Thursday 07/17/14  Yes [provider]  alum & mag hydroxide-simeth (MAALOX PLUS) 400-400-40 MG/5ML suspension Give 30 ml by mouth daily with meals as needed 12/07/17  Yes [provider]  ammonium lactate (LAC-HYDRIN) 12 % lotion Apply 1 application topically 2 (two) times daily as needed.  12/07/17  Yes [provider]  Artificial Saliva (BIOTENE ORALBALANCE DRY MOUTH) GEL Use as directed 1 application in the mouth or throat daily as needed. Apply a pea size amount to mucous membrane for dry mouth   Yes [provider]  calcium-vitamin D (OSCAL WITH D) 500-200 MG-UNIT tablet Take 1 tablet by mouth 2 (two) times daily.  12/07/17  Yes [provider]  carbamide peroxide (DEBROX) 6.5 % OTIC solution 5 drops 2 (two) times daily. 12/09/17  Yes [provider]  diclofenac sodium (VOLTAREN) 1 % GEL Apply 2 g topically 3 (three) times daily as needed. Apply to Bilateral shoulders 03/19/18  Yes [provider]  DULoxetine (CYMBALTA) 30 MG capsule Take 30 mg by mouth at bedtime.   Yes [provider]  gabapentin (NEURONTIN) 800 MG tablet Take 800 mg by mouth 3 (three) times daily.   Yes [provider]  Glycerin-Hypromellose-PEG 400 0.2-0.2-1 % SOLN Place 1 drop into both eyes See admin instructions. BID scheduled and TID PRN 12/20/17  Yes [provider]  Magnesium 250 MG TABS Take 1 tablet by mouth daily.   Yes [provider]  mirabegron ER (MYRBETRIQ) 50 MG TB24 tablet Take 50 mg by mouth at bedtime. 12/07/17  Yes [provider]  naloxegol oxalate (MOVANTIK) 12.5 MG TABS tablet Take 12.5 mg by mouth every morning.   Yes [provider]  polyethylene glycol (MIRALAX / GLYCOLAX) packet Take 17 g by mouth daily as needed.    Yes [provider]  pregabalin (LYRICA) 100 MG capsule Take 100 mg by mouth daily.   Yes [provider]  Skin Protectants, Misc. (EUCERIN) cream Wash feet with soap and water, dry. Apply Eucerin cream to dry, scaly skin daily, prn   Yes [provider]  solifenacin (VESICARE) 10 MG tablet Take by mouth daily.   Yes [provider]  tamsulosin (FLOMAX) 0.4 MG CAPS capsule Take 0.4 mg by mouth daily.    Yes [provider]  vitamin C (ASCORBIC ACID) 500 MG tablet Take 500 mg by mouth daily.  12/07/17  Yes [provider]  Ostomy Supplies (SKIN PREP WIPES) MISC Apply topically to toes every night at bedtime 03/19/18   [provider]  UNABLE TO FIND Regular diet, small portions.   Aspiration precautions 02/24/18   [provider]    Allergies as of 12/28/2019 - Review Complete 05/19/2018  Allergen Reaction Noted  . Amitriptyline Rash 06/29/2013  . Nortriptyline Other (See Comments) and Rash  06/29/2013    Family History  Problem Relation Age of Onset  . Stroke Father   . Alcohol abuse Father   . Diabetes type II Father   . Heart disease Father        pacemaker  . Diabetes type II Brother   . Heart disease Brother        pacemaker  . Stroke Mother   . COPD Mother   . Alcohol abuse Brother   . Arthritis Sister   . Glaucoma Paternal Uncle   . Sleep apnea Son   . Kidney disease Neg Hx   . Prostate cancer Neg Hx   . Macular degeneration Neg Hx  Social History   Socioeconomic History  . Marital status: Married    Spouse name: Not on file  . Number of children: Not on file  . Years of education: Not on file  . Highest education level: Not on file  Occupational History  . Not on file  Tobacco Use  . Smoking status: Never Smoker  . Smokeless tobacco: Never Used  Vaping Use  . Vaping Use: Never used  Substance and Sexual Activity  . Alcohol use: No    Alcohol/week: 0.0 standard drinks    Comment: history of alcohol addiction, attends regular AA meetings  . Drug use: No  . Sexual activity: Not on file  Other Topics Concern  . Not on file  Social History Narrative  . Not on file   Social Determinants of Health   Financial Resource Strain: Not on file  Food Insecurity: Not on file  Transportation Needs: Not on file  Physical Activity: Not on file  Stress: Not on file  Social Connections: Not on file  Intimate Partner Violence: Not on file    Review of Systems: See HPI, otherwise negative ROS  Physical Exam: BP 140/78   Pulse 77   Temp (!) 97 F (36.1 C) (Temporal)   Ht 5\' 5"  (1.651 m)   Wt 81.6 kg   SpO2 99%   BMI 29.95 kg/m  General:   Alert,  pleasant and cooperative in NAD Head:  Normocephalic and atraumatic. Respiratory:  Normal work of breathing.  Impression/Plan: is here for cataract surgery.  Risks, benefits, limitations, and alternatives regarding cataract surgery have been reviewed with the patient.  Questions  have been answered.  All parties agreeable.   Carroll Kinds, MD  01/04/2020, 8:30 AM

## 2020-01-04 NOTE — Anesthesia Postprocedure Evaluation (Signed)
Anesthesia Post Note  Patient: Dustin Turner  Procedure(s) Performed: CATARACT EXTRACTION PHACO AND INTRAOCULAR LENS PLACEMENT (IOC) RIGHT (Right Eye)     Patient location during evaluation: PACU Anesthesia Type: MAC Level of consciousness: awake and alert Pain management: pain level controlled Vital Signs Assessment: post-procedure vital signs reviewed and stable Respiratory status: spontaneous breathing and nonlabored ventilation Cardiovascular status: blood pressure returned to baseline Postop Assessment: no apparent nausea or vomiting Anesthetic complications: no   No complications documented.  Jannat Rosemeyer [employer]

## 2020-01-04 NOTE — Anesthesia Preprocedure Evaluation (Addendum)
Anesthesia Evaluation  Patient identified by MRN, date of birth, ID band Patient awake    Airway Mallampati: II  TM Distance: >3 FB Neck ROM: Full    Dental no notable dental hx.    Pulmonary sleep apnea ,    Pulmonary exam normal        Cardiovascular hypertension, Normal cardiovascular exam  ECHO 12/2016 - Left ventricle: The cavity size was normal. There was mild concentric hypertrophy. Systolic function was vigorous. The estimated ejection fraction was in the range of 65% to 70%. Wall motion was normal; there were no regional wall motion abnormalities. The study is not technically sufficient to allow evaluation of LV diastolic function.  - Pulmonary arteries: Systolic pressure was moderately increased. PA peak pressure: 50 mm Hg (S).    Neuro/Psych Seizures -, Well Controlled,  Anxiety Depression  Neuromuscular disease (Peripheral neuropathy) CVA, No Residual Symptoms    GI/Hepatic Neg liver ROS, hiatal hernia, GERD  Controlled,  Endo/Other  negative endocrine ROS  Renal/GU negative Renal ROS Bladder dysfunction      Musculoskeletal  (+) Arthritis , Osteoarthritis,    Abdominal Normal abdominal exam  (+)   Peds  Hematology negative hematology ROS (+)   Anesthesia Other Findings   Reproductive/Obstetrics                            Anesthesia Physical Anesthesia Plan  ASA: II  Anesthesia Plan: MAC   Post-op Pain Management:    Induction: Intravenous  PONV Risk Score and Plan: 1 and TIVA, Treatment may vary due to age or medical condition and Midazolam  Airway Management Planned: Natural Airway and Nasal Cannula  Additional Equipment:   Intra-op Plan:   Post-operative Plan:   Informed Consent: I have reviewed the patients History and Physical, chart, labs and discussed the procedure including the risks, benefits and alternatives for the proposed anesthesia with the patient or  authorized representative who has indicated his/her understanding and acceptance.     Dental advisory given  Plan Discussed with: CRNA  Anesthesia Plan Comments:         Anesthesia Quick Evaluation

## 2020-01-04 NOTE — Op Note (Signed)
OPERATIVE NOTE  Dustin Turner [id number] 01/04/2020   PREOPERATIVE DIAGNOSIS:  Nuclear sclerotic cataract right eye.  H25.11   POSTOPERATIVE DIAGNOSIS:     1.  Nuclear sclerotic cataract right eye.   2.  Intraoperative floppy iris syndrome.   PROCEDURE: CPT [id number] Complex Phacoemusification with posterior chamber intraocular lens placement of the right eye, requiring use of malyugin ring for dilation and stabilization of the iris.  LENS:   Implant Name Type Inv. Item Serial No. Manufacturer Lot No. LRB No. Used Action  LENS IOL TECNIS EYHANCE 9.5 - [id number] Intraocular Lens LENS IOL TECNIS EYHANCE 9.5 [id number] JOHNSON   Right 1 Implanted       Procedure(s) with comments: CATARACT EXTRACTION PHACO AND INTRAOCULAR LENS PLACEMENT (IOC) RIGHT (Right) - 4.77 0:34.4  DIB00 +9.5   ULTRASOUND TIME: 0 minutes 34 seconds.  CDE 4.77   SURGEON:  Willey Blade, MD, MPH  ANESTHESIOLOGIST: Anesthesiologist: Fletcher Anon, MD CRNA: Maree Krabbe, CRNA   ANESTHESIA:  Topical with tetracaine drops augmented with 1% preservative-free intracameral lidocaine.  ESTIMATED BLOOD LOSS: less than 1 mL.   COMPLICATIONS:  None.   DESCRIPTION OF PROCEDURE:  The patient was identified in the holding room and transported to the operating room and placed in the supine position under the operating microscope.  The right eye was identified as the operative eye and it was prepped and draped in the usual sterile ophthalmic fashion.  The patient had significant kyphosis but was well positioned and comfortable.  A 1.0 millimeter clear-corneal paracentesis was made at the 10:30 position. 0.5 ml of preservative-free 1% lidocaine with epinephrine was injected into the anterior chamber.  The anterior chamber was filled with Healon 5 viscoelastic.  A 2.4 millimeter keratome was used to make a near-clear corneal incision at the 8:00 position.    The pupil was small and the iris was very floppy, so a malyugin ring  was placed without difficulty. A curvilinear capsulorrhexis was made with a cystotome and capsulorrhexis forceps.  Balanced salt solution was used to hydrodissect and hydrodelineate the nucleus.   Phacoemulsification was then used in stop and chop fashion to remove the lens nucleus and epinucleus.  The remaining cortex was then removed using the irrigation and aspiration handpiece. Healon was then placed into the capsular bag to distend it for lens placement.    There was substantial positive posterior pressure noted, with a relatively shallow chamber despite Healon placement. A lens was then injected into the capsular bag.  The ring was removed without difficulty.  The remaining viscoelastic was aspirated.   Wounds were hydrated with balanced salt solution.  The anterior chamber was inflated to a physiologic pressure with balanced salt solution.  The lens was well centered and positioned.  Intracameral vigamox 0.1 mL undiluted was injected into the eye and a drop placed onto the ocular surface.  No wound leaks were noted.  The patient was taken to the recovery room in stable condition without complications of anesthesia or surgery  Willey Blade 01/04/2020, 9:10 AM

## 2020-01-04 NOTE — Transfer of Care (Signed)
Immediate Anesthesia Transfer of Care Note  Patient: Dustin Turner  Procedure(s) Performed: CATARACT EXTRACTION PHACO AND INTRAOCULAR LENS PLACEMENT (IOC) RIGHT (Right Eye)  Patient Location: PACU  Anesthesia Type: MAC  Level of Consciousness: awake, alert  and patient cooperative  Airway and Oxygen Therapy: Patient Spontanous Breathing and Patient connected to supplemental oxygen  Post-op Assessment: Post-op Vital signs reviewed, Patient's Cardiovascular Status Stable, Respiratory Function Stable, Patent Airway and No signs of Nausea or vomiting  Post-op Vital Signs: Reviewed and stable  Complications: No complications documented.

## 2020-01-05 ENCOUNTER — Encounter: Payer: Self-pay | Admitting: Ophthalmology

## 2020-05-19 NOTE — Progress Notes (Signed)
[facility] [address] [place], [place] [address]  Pulmonary Sleep Medicine   Office Visit Note  Patient Name: Dustin Turner DOB: Feb 21, 1935 MRN [id number]    Chief Complaint: Obstructive Sleep Apnea visit  Brief History:  Dustin Turner is seen today for OSA.  The patient has a 10 year history of sleep apnea. Patient is using PAP almost nightly.  The patient feels little improvement after sleeping with PAP.  In the past he was on BiPap but this was changed when he transferred care to [facility] and had a repeat PSG. He has been using a cpap at 11 cm H20. His last PSG was in December 2019. He would like a new machine, he has had the current one for 5 years as of June.  Reported sleepiness is  Ongoing  and the Epworth Sleepiness Score is 12 out of 24. The patient does not take naps. The patient complains of the following: machine is old.  The compliance download shows  compliance with an average use time of 6:20 hours. The AHI is 6.9  The patient does complain  of limb movements disrupting sleep.  ROS  General: (-) fever, (-) chills, (-) night sweat Nose and Sinuses: (-) nasal stuffiness or itchiness, (-) postnasal drip, (-) nosebleeds, (-) sinus trouble. Mouth and Throat: (-) sore throat, (-) hoarseness. Neck: (-) swollen glands, (-) enlarged thyroid, (-) neck pain. Respiratory:  cough,  shortness of breath,  wheezing. Neurologic: + numbness, + tingling long term spinal stenosis Psychiatric: - anxiety, - depression   Current Medication: Outpatient Encounter Medications as of 05/23/2020  Medication Sig  . acetaminophen (TYLENOL) 325 MG tablet Take 975 mg by mouth every 8 (eight) hours as needed.  [name] Kitchen alendronate (FOSAMAX) 70 MG tablet Take 70 mg by mouth once a week. On Thursday  . alum & mag hydroxide-simeth (MAALOX PLUS) 400-400-40 MG/5ML suspension Give 30 ml by mouth daily with meals as needed  . ammonium lactate (LAC-HYDRIN) 12 % lotion Apply 1 application topically 2 (two) times  daily as needed.   . Artificial Saliva (BIOTENE ORALBALANCE DRY MOUTH) GEL Use as directed 1 application in the mouth or throat daily as needed. Apply a pea size amount to mucous membrane for dry mouth  . calcium-vitamin D (OSCAL WITH D) 500-200 MG-UNIT tablet Take 1 tablet by mouth 2 (two) times daily.   . carbamide peroxide (DEBROX) 6.5 % OTIC solution 5 drops 2 (two) times daily.  . diclofenac sodium (VOLTAREN) 1 % GEL Apply 2 g topically 3 (three) times daily as needed. Apply to Bilateral shoulders  . DULoxetine HCl 60 MG CSDR Take by mouth.  . finasteride (PROSCAR) 5 MG tablet Take 5 mg by mouth daily.  [name] Kitchen gabapentin (NEURONTIN) 800 MG tablet Take 800 mg by mouth 3 (three) times daily.  . Glycerin-Hypromellose-PEG 400 0.2-0.2-1 % SOLN Place 1 drop into both eyes See admin instructions. BID scheduled and TID PRN  . Magnesium 250 MG TABS Take 1 tablet by mouth daily.  . mirabegron ER (MYRBETRIQ) 50 MG TB24 tablet Take 50 mg by mouth at bedtime.  . naloxegol oxalate (MOVANTIK) 12.5 MG TABS tablet Take 12.5 mg by mouth every morning.  . Ostomy Supplies (SKIN PREP WIPES) MISC Apply topically to toes every night at bedtime  . polyethylene glycol (MIRALAX / GLYCOLAX) packet Take 17 g by mouth daily as needed.   . pregabalin (LYRICA) 100 MG capsule Take 100 mg by mouth daily. 100 mg am and 200 mg qhs  .  Skin Protectants, Misc. (EUCERIN) cream Wash feet with soap and water, dry. Apply Eucerin cream to dry, scaly skin daily, prn  . solifenacin (VESICARE) 10 MG tablet Take by mouth daily.  . tamsulosin (FLOMAX) 0.4 MG CAPS capsule Take 0.4 mg by mouth daily.   . UNABLE TO FIND Regular diet, small portions.   Aspiration precautions  . vitamin C (ASCORBIC ACIMarland Kitchen) 500 MG tablet Take 500 mg by mouth daily.   . [DISCONTINUED] DULoxetine (CYMBALTA) 30 MG capsule Take 30 mg by mouth at bedtime.   No facility-administered encounter medications on file as of 05/23/2020.    Surgical History: Past Surgical  History:  Procedure Laterality Date  . CARPAL TUNNEL RELEASE Left 03/16/2016   Dr. Kennedy BuckerMichael Turner  . CATARACT EXTRACTION W/PHACO Right 01/04/2020   Procedure: CATARACT EXTRACTION PHACO AND INTRAOCULAR LENS PLACEMENT (IOC) RIGHT;  Surgeon: Dustin Turner, Dustin Mark, MD;  [facility];  Service: Ophthalmology;  Laterality: Right;  4.77 0:34.4  . CERVICAL SPINE SURGERY    . CORNEAL EYE SURGERY Right 09/08/2014   PTK (Dr. Chales Turner)  . HEMORRHOID SURGERY    . HERNIA REPAIR     left side  . INTRATHECAL PUMP IMPLANTATION  04/28/2014   Procedure: REVISION INTRATHECAL CATH; Surgeon: Dustin Blacksmithichard Levi Boortz-Marx, MD; Location: [facility] OR; Service: Anes/ Pain Mgmt; Laterality: N/A;  . JOINT REPLACEMENT Bilateral    bilateral knee replacements  . LUMBAR SPINE SURGERY    . PAIN PUMP IMPLANTATION     multiple procedures for removal and insertion this is his 3rd pain pump  . PAIN PUMP REMOVAL     multiple procedures for removal and insertion this is his 3rd pain pump  . Proleive System Treatment  2006  . ROTATOR CUFF REPAIR Right    x2  . SPINAL CORD STIMULATOR IMPLANT     x2  . THORACIC SPINE SURGERY    . TONSILLECTOMY      Medical History: Past Medical History:  Diagnosis Date  . Abdominal pain    other specified site  . Abnormal gait   . Advance directive in chart 10/12/2015  . Anemia   . Arthritis   . BPH (benign prostatic hyperplasia)   . Cataract   . Cervical spondylosis without myelopathy   . Cognitive dysfunction   . DDD (degenerative disc disease), cervical 06/29/2013  . Degeneration of cervical intervertebral disc   . Degeneration of lumbar intervertebral disc   . Depression   . Excessive daytime sleepiness   . Generalized osteoarthritis of multiple sites 11/19/2013  . GERD (gastroesophageal reflux disease)   . Hallux varus, acquired   . Hiatal hernia   . Hyperlipidemia   . Hypertension    (12/30/19 - pt denies)  . Hypogonadism in male   . Impaired functional  mobility, balance, gait, and endurance 11/14/2015   uses motorized wheelchair  . Insomnia 06/29/2013  . Low back pain   . Lumbosacral spondylosis without myelopathy   . Memory loss   . Mild cognitive impairment   . Neurogenic bladder   . OP (osteoporosis)   . Organic sleep apnea   . Osteoporosis 03/15/2004  . Peripheral neuropathy   . Radial nerve palsy   . Scoliosis 06/29/2013  . Seizure (HCC) 07/31/2016  . Sleep apnea    CPAP to bring DOS sleep study done in [place]  . Spinal compression fracture (HCC)   . Spinal stenosis   . Spondylolisthesis, acquired   . Urinary incontinence, stress, male 08/10/2015  . Urinary  urgency   . Uses hearing aid 10/12/2015   both ears  . Uses wheelchair   . Venous stasis   . Venous stasis dermatitis of both lower extremities 06/29/2013  . Vision abnormalities   . Vitamin B12 deficiency 09/27/2016  . Wrist drop, bilateral     Family History: Non contributory to the present illness  Social History: Social History   Socioeconomic History  . Marital status: Married    Spouse name: Not on file  . Number of children: Not on file  . Years of education: Not on file  . Highest education level: Not on file  Occupational History  . Not on file  Tobacco Use  . Smoking status: Never Smoker  . Smokeless tobacco: Never Used  Vaping Use  . Vaping Use: Never used  Substance and Sexual Activity  . Alcohol use: No    Alcohol/week: 0.0 standard drinks    Comment: history of alcohol addiction, attends regular AA meetings  . Drug use: No  . Sexual activity: Not on file  Other Topics Concern  . Not on file  Social History Narrative  . Not on file   Social Determinants of Health   Financial Resource Strain: Not on file  Food Insecurity: Not on file  Transportation Needs: Not on file  Physical Activity: Not on file  Stress: Not on file  Social Connections: Not on file  Intimate Partner Violence: Not on file    Vital Signs: Blood  pressure 101/61, pulse (!) 52, temperature 98.8 F (37.1 C), temperature source Temporal, resp. rate 16, height 5\' 5"  (1.651 m), weight 176 lb (79.8 kg), SpO2 97 %.  Examination: General Appearance: The patient is well-developed, well-nourished, and in no distress. Neck Circumference: 39 Skin: Gross inspection of skin unremarkable. Head: normocephalic, no gross deformities. Eyes: no gross deformities noted. ENT: ears appear grossly normal Neurologic: Alert and oriented. No involuntary movements.    EPWORTH SLEEPINESS SCALE:  Scale:  (0)= no chance of dozing; (1)= slight chance of dozing; (2)= moderate chance of dozing; (3)= high chance of dozing  Chance  Situtation    Sitting and reading: 3    Watching TV: 0    Sitting Inactive in public: 3    As a passenger in car: 3      Lying down to rest: 0    Sitting and talking: 0    Sitting quielty after lunch: 3    In a car, stopped in traffic: 0   TOTAL SCORE:   12 out of 24    SLEEP STUDIES:  1. PSG 05/08/15 - AHI 7.4, With central apnea index 5.0 due to medication effect,, Low SpO2 87%   CPAP COMPLIANCE DATA:  Date Range: 04/06822 - 05/19/20   Average Daily Use: 6:20 hours  Median Use: 7:43 hours  Compliance for > 4 Hours: 83%  AHI: 6.9 respiratory events per hour  Days Used: 25/30 days  Mask Leak: 41 lpm  95th Percentile Pressure: 9 cmH2O         LABS: No results found for this or any previous visit (from the past 2160 hour(s)).  Radiology: No results found.  No results found.  No results found.    Assessment and Plan: Patient Active Problem List   Diagnosis Date Noted  . OSA on CPAP 05/23/2020  . CPAP use counseling 05/23/2020  . Chronic pain syndrome 10/26/2017  . Cerebellar stroke (HCC) 01/01/2017  . Chronic venous insufficiency 04/09/2016  . Hypogonadism in male 05/27/2015  .  BPH with obstruction/lower urinary tract symptoms 04/15/2015  . Narrowing of intervertebral disc  space 07/23/2014  . Depression with anxiety 07/23/2014  . GERD without esophagitis 07/23/2014  . 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 Scoliosis 07/23/2014  . Compressed spine fracture (HCC) 07/23/2014  . Spinal stenosis 07/23/2014  . Obstructive apnea 12/08/2013  . Generalized OA 11/19/2013  . 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 Osteoporosis 07/30/2013  . Dermatitis, stasis 06/29/2013  . Curvature of spine 10/10/2012  . Acquired deformities of spine 10/10/2012  . Cervical spondylosis without myelopathy 05/03/2011  . Cervical osteoarthritis 05/03/2011  . DDD (degenerative disc disease), lumbosacral 05/03/2011  . Angulation of spine 05/03/2011  . Degenerative arthritis of lumbar spine 05/03/2011  . Lumbosacral spondylosis 05/03/2011  . Back pain, chronic 03/29/2011  . Benign fibroma of prostate 03/29/2011  . Arthritis 08/07/2010  . Constipation due to opioid therapy 08/07/2010   1. OSA on CPAP The patient does tolerate PAP and reports clear benefit from PAP use. However, it is unclear whether the patient needs cpap or bipap. He would benefit from a repeat titration study and he agrees.  His compliance is good on cpap at 9 cm H20, but his ahi is above target at 6.9.  osa-  In past primarily obstructive, likely due to narcotic treatment which is ongoing, and he would benefit from a repeat cpap titration study.    2. CPAP use counseling CPAP Counseling: had a lengthy discussion with the patient regarding the importance of PAP therapy in management of the sleep apnea. Patient appears to understand the risk factor reduction and also understands the risks associated with untreated sleep apnea. Patient will try to make a good faith effort to remain compliant with therapy. Also instructed the patient on proper cleaning of the device including the water must be changed daily if possible and use of distilled water is preferred. Patient understands that the machine should be  regularly cleaned with appropriate recommended cleaning solutions that do not damage the PAP machine for example given white vinegar and water rinses. Other methods such as ozone treatment may not be as good as these simple methods to achieve cleaning.  3. Chronic pain syndrome Pt is on narcotics/has intrathecal pain pump. These are being titrated down and this may affect his OSA.     General Counseling: I have discussed the findings of the evaluation and examination with Francois.  I have also discussed any further diagnostic evaluation thatmay be needed or ordered today. Gevin verbalizes understanding of the findings of todays visit. We also reviewed his medications today and discussed drug interactions and side effects including but not limited excessive drowsiness and altered mental states. We also discussed that there is always a risk not just to him but also people around him. he has been encouraged to call the office with any questions or concerns that should arise related to todays visit.  No orders of the defined types were placed in this encounter.       I have personally obtained a history, examined the patient, evaluated laboratory and imaging results, formulated the assessment and plan and placed orders.  This patient was seen today by Emmaline Kluver, PA-C in collaboration with Dr. Freda Munro.  Valentino Hue Sol Blazing, PhD, FAASM  Diplomate, American Board of Sleep Medicine    Yevonne Pax, MD [facility] Diplomate ABMS Pulmonary and Critical Care Medicine Sleep medicine

## 2020-05-23 ENCOUNTER — Ambulatory Visit (INDEPENDENT_AMBULATORY_CARE_PROVIDER_SITE_OTHER): Payer: Medicare HMO | Admitting: Internal Medicine

## 2020-05-23 VITALS — BP 101/61 | HR 52 | Temp 98.8°F | Resp 16 | Ht 65.0 in | Wt 176.0 lb

## 2020-05-23 DIAGNOSIS — G894 Chronic pain syndrome: Secondary | ICD-10-CM

## 2020-05-23 DIAGNOSIS — G4733 Obstructive sleep apnea (adult) (pediatric): Secondary | ICD-10-CM | POA: Diagnosis not present

## 2020-05-23 DIAGNOSIS — Z9989 Dependence on other enabling machines and devices: Secondary | ICD-10-CM | POA: Diagnosis not present

## 2020-05-23 DIAGNOSIS — Z7189 Other specified counseling: Secondary | ICD-10-CM | POA: Diagnosis not present

## 2020-05-23 NOTE — Patient Instructions (Signed)

## 2020-08-22 ENCOUNTER — Ambulatory Visit (INDEPENDENT_AMBULATORY_CARE_PROVIDER_SITE_OTHER): Payer: Medicare HMO | Admitting: Internal Medicine

## 2020-08-22 ENCOUNTER — Other Ambulatory Visit: Payer: Self-pay

## 2020-08-22 VITALS — BP 106/64 | HR 67 | Resp 16 | Ht 65.0 in | Wt 170.0 lb

## 2020-08-22 DIAGNOSIS — K219 Gastro-esophageal reflux disease without esophagitis: Secondary | ICD-10-CM

## 2020-08-22 DIAGNOSIS — I639 Cerebral infarction, unspecified: Secondary | ICD-10-CM

## 2020-08-22 DIAGNOSIS — Z7189 Other specified counseling: Secondary | ICD-10-CM | POA: Diagnosis not present

## 2020-08-22 DIAGNOSIS — Z9989 Dependence on other enabling machines and devices: Secondary | ICD-10-CM

## 2020-08-22 DIAGNOSIS — G4733 Obstructive sleep apnea (adult) (pediatric): Secondary | ICD-10-CM | POA: Diagnosis not present

## 2020-08-22 NOTE — Progress Notes (Signed)
[facility] [address] [place], [place] [address]  Pulmonary Sleep Medicine   Office Visit Note  Patient Name: Dustin Turner DOB: 28-Apr-1935 MRN [id number]    Chief Complaint: Obstructive Sleep Apnea visit  Brief History:  Dustin Turner is seen today for face to face notes for replacement machine. The patient has a 10 year history of sleep apnea. Patient is using PAP nightly.  The patient feels better after sleeping with PAP.  The patient reports benefiting from PAP use. Reported sleepiness is  resolved and the Epworth Sleepiness Score is 4 out of 24. The patient does not take naps. The patient complains of the following: some oral dryness with full face more than nasal.  The compliance download shows  compliance with an average use time of 8:12  hours @v  97%. The AHI is 5.0  The patient does complain of limb movements disrupting sleep but they are only infrequently symptomatic.   ROS  General: (-) fever, (-) chills, (-) night sweat Nose and Sinuses: (-) nasal stuffiness or itchiness, (-) postnasal drip, (-) nosebleeds, (-) sinus trouble. Mouth and Throat: (-) sore throat, (-) hoarseness. Neck: (-) swollen glands, (-) enlarged thyroid, (-) neck pain. Respiratory: - cough, - shortness of breath, - wheezing. Neurologic: - numbness, - tingling. Psychiatric: - anxiety, - depression   Current Medication: Outpatient Encounter Medications as of 08/22/2020  Medication Sig   acetaminophen (TYLENOL) 325 MG tablet Take 975 mg by mouth every 8 (eight) hours as needed.   alendronate (FOSAMAX) 70 MG tablet Take 70 mg by mouth once a week. On Thursday   alum & mag hydroxide-simeth (MAALOX PLUS) 400-400-40 MG/5ML suspension Give 30 ml by mouth daily with meals as needed   ammonium lactate (LAC-HYDRIN) 12 % lotion Apply 1 application topically 2 (two) times daily as needed.    Artificial Saliva (BIOTENE ORALBALANCE DRY MOUTH) GEL Use as directed 1 application in the mouth or throat daily as  needed. Apply a pea size amount to mucous membrane for dry mouth   calcium-vitamin D (OSCAL WITH D) 500-200 MG-UNIT tablet Take 1 tablet by mouth 2 (two) times daily.    carbamide peroxide (DEBROX) 6.5 % OTIC solution 5 drops 2 (two) times daily.   diclofenac sodium (VOLTAREN) 1 % GEL Apply 2 g topically 3 (three) times daily as needed. Apply to Bilateral shoulders   DULoxetine HCl 60 MG CSDR Take by mouth.   finasteride (PROSCAR) 5 MG tablet Take 5 mg by mouth daily.   gabapentin (NEURONTIN) 800 MG tablet Take 800 mg by mouth 3 (three) times daily.   Glycerin-Hypromellose-PEG 400 0.2-0.2-1 % SOLN Place 1 drop into both eyes See admin instructions. BID scheduled and TID PRN   Magnesium 250 MG TABS Take 1 tablet by mouth daily.   mirabegron ER (MYRBETRIQ) 50 MG TB24 tablet Take 50 mg by mouth at bedtime.   naloxegol oxalate (MOVANTIK) 12.5 MG TABS tablet Take 12.5 mg by mouth every morning.   Ostomy Supplies (SKIN PREP WIPES) MISC Apply topically to toes every night at bedtime   polyethylene glycol (MIRALAX / GLYCOLAX) packet Take 17 g by mouth daily as needed.    pregabalin (LYRICA) 100 MG capsule Take 100 mg by mouth daily. 100 mg am and 200 mg qhs   Skin Protectants, Misc. (EUCERIN) cream Wash feet with soap and water, dry. Apply Eucerin cream to dry, scaly skin daily, prn   solifenacin (VESICARE) 10 MG tablet Take by mouth daily.   tamsulosin (FLOMAX) 0.4  MG CAPS capsule Take 0.4 mg by mouth daily.    UNABLE TO FIND Regular diet, small portions.   Aspiration precautions   vitamin C (ASCORBIC ACID) 500 MG tablet Take 500 mg by mouth daily.    No facility-administered encounter medications on file as of 08/22/2020.    Surgical History: Past Surgical History:  Procedure Laterality Date   CARPAL TUNNEL RELEASE Left 03/16/2016   Dr. Kennedy Bucker   CATARACT EXTRACTION W/PHACO Right 01/04/2020   Procedure: CATARACT EXTRACTION PHACO AND INTRAOCULAR LENS PLACEMENT (IOC) RIGHT;  Surgeon: Nevada Crane, MD;  Location: [facility];  Service: Ophthalmology;  Laterality: Right;  4.77 0:34.4   CERVICAL SPINE SURGERY     CORNEAL EYE SURGERY Right 09/08/2014   PTK (Dr. Chales Abrahams)   HEMORRHOID SURGERY     HERNIA REPAIR     left side   INTRATHECAL PUMP IMPLANTATION  04/28/2014   Procedure: REVISION INTRATHECAL CATH; Surgeon: Burman Blacksmith, MD; Location: [facility] OR; Service: Anes/ Pain Mgmt; Laterality: N/A;   JOINT REPLACEMENT Bilateral    bilateral knee replacements   LUMBAR SPINE SURGERY     PAIN PUMP IMPLANTATION     multiple procedures for removal and insertion this is his 3rd pain pump   PAIN PUMP REMOVAL     multiple procedures for removal and insertion this is his 3rd pain pump   Proleive System Treatment  2006   ROTATOR CUFF REPAIR Right    x2   SPINAL CORD STIMULATOR IMPLANT     x2   THORACIC SPINE SURGERY     TONSILLECTOMY      Medical History: Past Medical History:  Diagnosis Date   Abdominal pain    other specified site   Abnormal gait    Advance directive in chart 10/12/2015   Anemia    Arthritis    BPH (benign prostatic hyperplasia)    Cataract    Cervical spondylosis without myelopathy    Cognitive dysfunction    DDD (degenerative disc disease), cervical 06/29/2013   Degeneration of cervical intervertebral disc    Degeneration of lumbar intervertebral disc    Depression    Excessive daytime sleepiness    Generalized osteoarthritis of multiple sites 11/19/2013   GERD (gastroesophageal reflux disease)    Hallux varus, acquired    Hiatal hernia    Hyperlipidemia    Hypertension    (12/30/19 - pt denies)   Hypogonadism in male    Impaired functional mobility, balance, gait, and endurance 11/14/2015   uses motorized wheelchair   Insomnia 06/29/2013   Low back pain    Lumbosacral spondylosis without myelopathy    Memory loss    Mild cognitive impairment    Neurogenic bladder    OP (osteoporosis)    Organic sleep apnea     Osteoporosis 03/15/2004   Peripheral neuropathy    Radial nerve palsy    Scoliosis 06/29/2013   Seizure (HCC) 07/31/2016   Sleep apnea    CPAP to bring DOS sleep study done in [place]   Spinal compression fracture (HCC)    Spinal stenosis    Spondylolisthesis, acquired    Urinary incontinence, stress, male 08/10/2015   Urinary urgency    Uses hearing aid 10/12/2015   both ears   Uses wheelchair    Venous stasis    Venous stasis dermatitis of both lower extremities 06/29/2013   Vision abnormalities    Vitamin B12 deficiency 09/27/2016   Wrist drop, bilateral  Family History: Non contributory to the present illness  Social History: Social History   Socioeconomic History   Marital status: Married    Spouse name: Not on file   Number of children: Not on file   Years of education: Not on file   Highest education level: Not on file  Occupational History   Not on file  Tobacco Use   Smoking status: Never   Smokeless tobacco: Never  Vaping Use   Vaping Use: Never used  Substance and Sexual Activity   Alcohol use: No    Alcohol/week: 0.0 standard drinks    Comment: history of alcohol addiction, attends regular AA meetings   Drug use: No   Sexual activity: Not on file  Other Topics Concern   Not on file  Social History Narrative   Not on file   Social Determinants of Health   Financial Resource Strain: Not on file  Food Insecurity: Not on file  Transportation Needs: Not on file  Physical Activity: Not on file  Stress: Not on file  Social Connections: Not on file  Intimate Partner Violence: Not on file    Vital Signs: Blood pressure 106/64, pulse 67, resp. rate 16, height 5\' 5"  (1.651 m), weight 170 lb (77.1 kg), SpO2 99 %.  Examination: General Appearance: The patient is well-developed, well-nourished, and in no distress. Neck Circumference: 45 Skin: Gross inspection of skin unremarkable. Head: normocephalic, no gross deformities. Eyes: no gross  deformities noted. ENT: ears appear grossly normal Neurologic: Alert and oriented. No involuntary movements.    EPWORTH SLEEPINESS SCALE:  Scale:  (0)= no chance of dozing; (1)= slight chance of dozing; (2)= moderate chance of dozing; (3)= high chance of dozing  Chance  Situtation    Sitting and reading: 2    Watching TV: 0    Sitting Inactive in public: 0    As a passenger in car: 2      Lying down to rest: 0    Sitting and talking: 0    Sitting quielty after lunch: 0    In a car, stopped in traffic: 0   TOTAL SCORE:   4 out of 24    SLEEP STUDIES:  PSG 05/08/15 - AHI 7.4, With central apnea index 5.0 due to medication effect,, Low SpO2 87%    CPAP COMPLIANCE DATA:  Date Range: 07/24/20-08/22/20  Average Daily Use: 8:12 hours  Median Use: 8:34  Compliance for > 4 Hours: 97%  AHI: 5.0 respiratory events per hour  Days Used: 29/30  Mask Leak: 23.2  95th Percentile Pressure: 9 cmH2O         LABS: No results found for this or any previous visit (from the past 2160 hour(s)).  Radiology: No results found.  No results found.  No results found.    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   Scoliosis 07/23/2014   Compressed spine fracture (HCC) 07/23/2014   Spinal stenosis 07/23/2014   Obstructive apnea 12/08/2013   Generalized OA 11/19/2013   Idiopathic scoliosis and kyphoscoliosis 08/20/2013   Osteoporosis  07/30/2013   Dermatitis, stasis 06/29/2013   Curvature of spine  10/10/2012   Acquired deformities of spine 10/10/2012   Cervical spondylosis without myelopathy 05/03/2011   Cervical osteoarthritis 05/03/2011   DDD (degenerative disc disease), lumbosacral 05/03/2011   Angulation of spine 05/03/2011   Degenerative arthritis of lumbar spine 05/03/2011   Lumbosacral spondylosis 05/03/2011   Back pain, chronic 03/29/2011   Benign fibroma of prostate 03/29/2011   Arthritis 08/07/2010   Constipation due to opioid therapy 08/07/2010   1. OSA on CPAP The patient does tolerate PAP and reports definite benefit from PAP use. His machine is past end of life and must be replaced. Recent CPAP titration study shows the patient is optimally treated with 9 cm H20 with EPR of 2. The patient was reminded how to clean equipment and advised to replace supplies routinely. The patient was also counselled on weight loss. The compliance is excellent . The AHI is 5.0, improved. .   OSA- replace machine. F/u 30 d after set up.    2. CPAP use counseling CPAP Counseling: had a lengthy discussion with the patient regarding the importance of PAP therapy in management of the sleep apnea. Patient appears to understand the risk factor reduction and also understands the risks associated with untreated sleep apnea. Patient will try to make a good faith effort to remain compliant with therapy. Also instructed the patient on proper cleaning of the device including the water must be changed daily if possible and use of distilled water is preferred. Patient understands that the machine should be regularly cleaned with appropriate recommended cleaning solutions that do not damage the PAP machine for example given white vinegar and water rinses. Other methods such as ozone treatment may not be as good as these simple methods to achieve cleaning.   3. GERD without esophagitis Asymptomatic, stable  4. Cerebellar stroke [facility](HCC) Emphasized importance of treating apnea in light of same.    General  Counseling: I have discussed the findings of the evaluation and examination with Caedon.  I have also discussed any further diagnostic evaluation thatmay be needed or ordered today. Dereck verbalizes understanding of the findings of todays visit. We also reviewed his medications today and discussed drug interactions and side effects including but not limited excessive drowsiness and altered mental states. We also discussed that there is always a risk not just to him but also people around him. he has been encouraged to call the office with any questions or concerns that should arise related to todays visit.  No orders of the defined types were placed in this encounter.       I have personally obtained a history, examined the patient, evaluated laboratory and imaging results, formulated the assessment and plan and placed orders.    This patient was seen today by Emmaline KluverSarah Terrell, PA-C in collaboration with Dr. Freda MunroSaadat Henry Demeritt.   Yevonne PaxSaadat A Addyson Traub, MD [facility] Diplomate ABMS Pulmonary and Critical Care Medicine Sleep medicine

## 2020-08-22 NOTE — Patient Instructions (Signed)

## 2020-11-25 NOTE — Progress Notes (Signed)
[facility] [address] [place], [place] [address]  Pulmonary Sleep Medicine   Office Visit Note  Patient Name: Dustin Turner DOB: 1935/09/26 MRN [id number]    Chief Complaint: Obstructive Sleep Apnea visit  Brief History:  Dustin Turner is seen today for 30 day insurance compliance follow up.  The patient has a 10 year history of sleep apnea. Patient is using PAP nightly @ 9cm H2O.  The patient feels better after sleeping with PAP.  The patient reports benefiting from PAP use. Reported sleepiness is  resolved as much as possible with pain management and the Epworth Sleepiness Score is 6 out of 24. The patient does not take naps. The patient complains of the following: no complaints  The compliance download shows  compliance with an average use time of 7:03 hours @ 100%. He has had changes in his pain management- with switching to oral opiates rather than intrathecal opiates, but this did not control his pain well so he's getting a pump replacement. The AHI is 5.9  The patient does complain of limb movements disrupting sleep.  ROS  General: (-) fever, (-) chills, (-) night sweat Nose and Sinuses: (-) nasal stuffiness or itchiness, (-) postnasal drip, (-) nosebleeds, (-) sinus trouble. Mouth and Throat: (-) sore throat, (-) hoarseness. Neck: (-) swollen glands, (-) enlarged thyroid, (-) neck pain. Respiratory: - cough, - shortness of breath, - wheezing. Neurologic: - numbness, - tingling. Psychiatric: - anxiety, - depression   Current Medication: Outpatient Encounter Medications as of 11/28/2020  Medication Sig   naloxone (NARCAN) nasal spray 4 mg/0.1 mL Place into the nose.   solifenacin (VESICARE) 10 MG tablet    acetaminophen (TYLENOL) 325 MG tablet Take 975 mg by mouth every 8 (eight) hours as needed.   alendronate (FOSAMAX) 70 MG tablet Take 70 mg by mouth once a week. On Thursday   alum & mag hydroxide-simeth (MAALOX PLUS) 400-400-40 MG/5ML suspension Give 30 ml by mouth  daily with meals as needed   ammonium lactate (LAC-HYDRIN) 12 % lotion Apply 1 application topically 2 (two) times daily as needed.    Artificial Saliva (BIOTENE ORALBALANCE DRY MOUTH) GEL Use as directed 1 application in the mouth or throat daily as needed. Apply a pea size amount to mucous membrane for dry mouth   calcium-vitamin D (OSCAL WITH D) 500-200 MG-UNIT tablet Take 1 tablet by mouth 2 (two) times daily.    carbamide peroxide (DEBROX) 6.5 % OTIC solution 5 drops 2 (two) times daily.   diclofenac sodium (VOLTAREN) 1 % GEL Apply 2 g topically 3 (three) times daily as needed. Apply to Bilateral shoulders   DULoxetine HCl 60 MG CSDR Take by mouth.   finasteride (PROSCAR) 5 MG tablet Take 5 mg by mouth daily.   gabapentin (NEURONTIN) 800 MG tablet Take 800 mg by mouth 3 (three) times daily.   Glycerin-Hypromellose-PEG 400 0.2-0.2-1 % SOLN Place 1 drop into both eyes See admin instructions. BID scheduled and TID PRN   Magnesium 250 MG TABS Take 1 tablet by mouth daily.   mirabegron ER (MYRBETRIQ) 50 MG TB24 tablet Take 50 mg by mouth at bedtime.   MOVANTIK 25 MG TABS tablet Take by mouth.   naloxegol oxalate (MOVANTIK) 12.5 MG TABS tablet Take 12.5 mg by mouth every morning.   Ostomy Supplies (SKIN PREP WIPES) MISC Apply topically to toes every night at bedtime   polyethylene glycol (MIRALAX / GLYCOLAX) packet Take 17 g by mouth daily as needed.  pregabalin (LYRICA) 100 MG capsule Take 100 mg by mouth daily. 100 mg am and 200 mg qhs   pregabalin (LYRICA) 200 MG capsule Take 200 mg by mouth 2 (two) times daily.   senna-docusate (SENOKOT-S) 8.6-50 MG tablet Take 1 tablet by mouth daily.   Skin Protectants, Misc. (EUCERIN) cream Wash feet with soap and water, dry. Apply Eucerin cream to dry, scaly skin daily, prn   solifenacin (VESICARE) 10 MG tablet Take by mouth daily.   tamsulosin (FLOMAX) 0.4 MG CAPS capsule Take 0.4 mg by mouth daily.    UNABLE TO FIND Regular diet, small portions.    Aspiration precautions   vitamin C (ASCORBIC ACID) 500 MG tablet Take 500 mg by mouth daily.    No facility-administered encounter medications on file as of 11/28/2020.    Surgical History: Past Surgical History:  Procedure Laterality Date   CARPAL TUNNEL RELEASE Left 03/16/2016   Dr. Kennedy Bucker   CATARACT EXTRACTION W/PHACO Right 01/04/2020   Procedure: CATARACT EXTRACTION PHACO AND INTRAOCULAR LENS PLACEMENT (IOC) RIGHT;  Surgeon: Nevada Crane, MD;  Location: [facility];  Service: Ophthalmology;  Laterality: Right;  4.77 0:34.4   CERVICAL SPINE SURGERY     CORNEAL EYE SURGERY Right 09/08/2014   [facility] (Dr. Chales Abrahams)   HEMORRHOID SURGERY     HERNIA REPAIR     left side   INTRATHECAL PUMP IMPLANTATION  04/28/2014   Procedure: REVISION INTRATHECAL CATH; Surgeon: Burman Blacksmith, MD; Location: [facility] OR; Service: Anes/ Pain Mgmt; Laterality: N/A;   JOINT REPLACEMENT Bilateral    bilateral knee replacements   LUMBAR SPINE SURGERY     PAIN PUMP IMPLANTATION     multiple procedures for removal and insertion this is his 3rd pain pump   PAIN PUMP REMOVAL     multiple procedures for removal and insertion this is his 3rd pain pump   Proleive System Treatment  2006   ROTATOR CUFF REPAIR Right    x2   SPINAL CORD STIMULATOR IMPLANT     x2   THORACIC SPINE SURGERY     TONSILLECTOMY      Medical History: Past Medical History:  Diagnosis Date   Abdominal pain    other specified site   Abnormal gait    Advance directive in chart 10/12/2015   Anemia    Arthritis    BPH (benign prostatic hyperplasia)    Cataract    Cervical spondylosis without myelopathy    Cognitive dysfunction    DDD (degenerative disc disease), cervical 06/29/2013   Degeneration of cervical intervertebral disc    Degeneration of lumbar intervertebral disc    Depression    Excessive daytime sleepiness    Generalized osteoarthritis of multiple sites 11/19/2013   GERD (gastroesophageal reflux  disease)    Hallux varus, acquired    Hiatal hernia    Hyperlipidemia    Hypertension    (12/30/19 - pt denies)   Hypogonadism in male    Impaired functional mobility, balance, gait, and endurance 11/14/2015   uses motorized wheelchair   Insomnia 06/29/2013   Low back pain    Lumbosacral spondylosis without myelopathy    Memory loss    Mild cognitive impairment    Neurogenic bladder    OP (osteoporosis)    Organic sleep apnea    Osteoporosis 03/15/2004   Peripheral neuropathy    Radial nerve palsy    Scoliosis 06/29/2013   Seizure (HCC) 07/31/2016   Sleep apnea    CPAP to bring  DOS sleep study done in [place]   Spinal compression fracture (HCC)    Spinal stenosis    Spondylolisthesis, acquired    Urinary incontinence, stress, male 08/10/2015   Urinary urgency    Uses hearing aid 10/12/2015   both ears   Uses wheelchair    Venous stasis    Venous stasis dermatitis of both lower extremities 06/29/2013   Vision abnormalities    Vitamin B12 deficiency 09/27/2016   Wrist drop, bilateral     Family History: Non contributory to the present illness  Social History: Social History   Socioeconomic History   Marital status: Married    Spouse name: Not on file   Number of children: Not on file   Years of education: Not on file   Highest education level: Not on file  Occupational History   Not on file  Tobacco Use   Smoking status: Never   Smokeless tobacco: Never  Vaping Use   Vaping Use: Never used  Substance and Sexual Activity   Alcohol use: No    Alcohol/week: 0.0 standard drinks    Comment: history of alcohol addiction, attends regular AA meetings   Drug use: No   Sexual activity: Not on file  Other Topics Concern   Not on file  Social History Narrative   Not on file   Social Determinants of Health   Financial Resource Strain: Not on file  Food Insecurity: Not on file  Transportation Needs: Not on file  Physical Activity: Not on file  Stress: Not  on file  Social Connections: Not on file  Intimate Partner Violence: Not on file    Vital Signs: Blood pressure 120/65, pulse 80, resp. rate 18, height 5\' 5"  (1.651 m), weight 173 lb (78.5 kg), SpO2 97 %.  Examination: General Appearance: The patient is well-developed, well-nourished, and in no distress. Neck Circumference: 38.5 cm Skin: Gross inspection of skin unremarkable. Head: normocephalic, no gross deformities. Eyes: no gross deformities noted. ENT: ears appear grossly normal Neurologic: Alert and oriented. No involuntary movements.    EPWORTH SLEEPINESS SCALE:  Scale:  (0)= no chance of dozing; (1)= slight chance of dozing; (2)= moderate chance of dozing; (3)= high chance of dozing  Chance  Situtation    Sitting and reading: 3    Watching TV: 0    Sitting Inactive in public: 1    As a passenger in car: 0      Lying down to rest: 1    Sitting and talking: 0    Sitting quielty after lunch: 1    In a car, stopped in traffic: 0   TOTAL SCORE:   6 out of 24    SLEEP STUDIES:  PSG 05/08/15 - AHI 7.4, With central apnea index 5.0 due to medication effect,, Low SpO2 87%   CPAP COMPLIANCE DATA:  Date Range: 10/26/20 - 11/24/20  Average Daily Use: 7:03 hours  Median Use: 7:14 hours  Compliance for > 4 Hours: 100%  AHI: 5.9 respiratory events per hour  Days Used: 30/30  Mask Leak: 30.2 lpm  95th Percentile Pressure: 9 cmH2O         LABS: No results found for this or any previous visit (from the past 2160 hour(s)).  Radiology: No results found.  No results found.  No results found.    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   Scoliosis 07/23/2014   Compressed spine fracture (HCC) 07/23/2014   Spinal stenosis 07/23/2014   Obstructive apnea 12/08/2013   Generalized OA 11/19/2013   Idiopathic scoliosis and kyphoscoliosis 08/20/2013   Osteoporosis 07/30/2013   Dermatitis, stasis 06/29/2013   Curvature of spine 10/10/2012   Acquired deformities of spine 10/10/2012   Cervical spondylosis without myelopathy 05/03/2011   Cervical osteoarthritis 05/03/2011   DDD (degenerative disc disease), lumbosacral 05/03/2011   Angulation of spine 05/03/2011   Degenerative arthritis of lumbar spine 05/03/2011   Lumbosacral spondylosis 05/03/2011   Back pain, chronic 03/29/2011   Benign fibroma of prostate 03/29/2011   Arthritis 08/07/2010   Constipation due to opioid therapy 08/07/2010    1. OSA on CPAP The patient does tolerate PAP and reports  benefit from PAP use. The patient was reminded how to clean equipment and advised to replace supplies routinely. In light of his increase in events to AHI of 5.9, we will switch him to apap 8-15 and do a 2 week download. The patient was also counselled on weight loss. The compliance is excellent. The AHI is 5.9.   OSA- continue excellent compliance with pap. We will adjust pressure to apap 8-15 cm and do a 2 week download.   2. CPAP use counseling CPAP Counseling: had a lengthy discussion with the patient regarding the importance of PAP therapy in management of the sleep apnea. Patient appears to understand the risk factor reduction and also understands the risks associated with untreated sleep apnea. Patient will try to make a good faith effort to remain compliant with therapy. Also instructed the patient on proper cleaning of the device including the water must be changed daily if possible and use of distilled water is  preferred. Patient understands that the machine should be regularly cleaned with appropriate recommended cleaning solutions that do not damage the PAP machine for example given white vinegar and water rinses. Other methods such as ozone treatment may not be as good as these simple methods to achieve cleaning.   3. Chronic pain syndrome On hydromorphone via IT pump. Recently on oral opiates. Continue f/u with pain mgmt.     General Counseling: I have discussed the findings of the evaluation and examination with Dustin Turner.  I have also discussed any further diagnostic evaluation thatmay be needed or ordered today. Dustin Turner verbalizes understanding of the findings of todays visit. We also reviewed his medications today and discussed drug interactions and side effects including but not limited excessive drowsiness and altered mental states. We also discussed that there is always a risk not just to him but also people around him. he has been encouraged to call the office with any questions or concerns that should arise related to todays visit.  No orders of the defined types were placed in this encounter.       I have personally obtained a history, examined the patient, evaluated laboratory and imaging results, formulated the assessment and plan and placed orders. This patient was seen today by Emmaline Kluver, PA-C in collaboration with Dr. Freda Munro.   Yevonne Pax, MD [facility] Diplomate ABMS Pulmonary Critical Care Medicine and Sleep Medicine

## 2020-11-28 ENCOUNTER — Ambulatory Visit (INDEPENDENT_AMBULATORY_CARE_PROVIDER_SITE_OTHER): Payer: Medicare HMO | Admitting: Internal Medicine

## 2020-11-28 ENCOUNTER — Other Ambulatory Visit: Payer: Self-pay

## 2020-11-28 VITALS — BP 120/65 | HR 80 | Resp 18 | Ht 65.0 in | Wt 173.0 lb

## 2020-11-28 DIAGNOSIS — Z9989 Dependence on other enabling machines and devices: Secondary | ICD-10-CM | POA: Diagnosis not present

## 2020-11-28 DIAGNOSIS — G894 Chronic pain syndrome: Secondary | ICD-10-CM

## 2020-11-28 DIAGNOSIS — G4733 Obstructive sleep apnea (adult) (pediatric): Secondary | ICD-10-CM | POA: Diagnosis not present

## 2020-11-28 DIAGNOSIS — Z7189 Other specified counseling: Secondary | ICD-10-CM

## 2020-11-28 NOTE — Patient Instructions (Signed)

## 2021-06-21 ENCOUNTER — Emergency Department: Payer: Medicare HMO

## 2021-06-21 ENCOUNTER — Encounter: Payer: Self-pay | Admitting: *Deleted

## 2021-06-21 ENCOUNTER — Other Ambulatory Visit: Payer: Self-pay

## 2021-06-21 ENCOUNTER — Inpatient Hospital Stay
Admission: EM | Admit: 2021-06-21 | Discharge: 2021-06-24 | DRG: 698 | Disposition: A | Discharge status: Home Health | Payer: Medicare HMO | Source: Skilled Nursing Facility | Attending: Internal Medicine | Admitting: Internal Medicine

## 2021-06-21 DIAGNOSIS — I2510 Atherosclerotic heart disease of native coronary artery without angina pectoris: Secondary | ICD-10-CM | POA: Diagnosis present

## 2021-06-21 DIAGNOSIS — D649 Anemia, unspecified: Secondary | ICD-10-CM | POA: Diagnosis present

## 2021-06-21 DIAGNOSIS — A419 Sepsis, unspecified organism: Secondary | ICD-10-CM | POA: Diagnosis present

## 2021-06-21 DIAGNOSIS — M810 Age-related osteoporosis without current pathological fracture: Secondary | ICD-10-CM | POA: Diagnosis present

## 2021-06-21 DIAGNOSIS — R569 Unspecified convulsions: Secondary | ICD-10-CM

## 2021-06-21 DIAGNOSIS — Z9682 Presence of neurostimulator: Secondary | ICD-10-CM

## 2021-06-21 DIAGNOSIS — N390 Urinary tract infection, site not specified: Secondary | ICD-10-CM | POA: Diagnosis present

## 2021-06-21 DIAGNOSIS — Z8261 Family history of arthritis: Secondary | ICD-10-CM

## 2021-06-21 DIAGNOSIS — Z993 Dependence on wheelchair: Secondary | ICD-10-CM

## 2021-06-21 DIAGNOSIS — A4151 Sepsis due to Escherichia coli [E. coli]: Secondary | ICD-10-CM | POA: Diagnosis present

## 2021-06-21 DIAGNOSIS — Y846 Urinary catheterization as the cause of abnormal reaction of the patient, or of later complication, without mention of misadventure at the time of the procedure: Secondary | ICD-10-CM | POA: Diagnosis present

## 2021-06-21 DIAGNOSIS — G629 Polyneuropathy, unspecified: Secondary | ICD-10-CM | POA: Diagnosis present

## 2021-06-21 DIAGNOSIS — M1990 Unspecified osteoarthritis, unspecified site: Secondary | ICD-10-CM | POA: Diagnosis not present

## 2021-06-21 DIAGNOSIS — K219 Gastro-esophageal reflux disease without esophagitis: Secondary | ICD-10-CM | POA: Diagnosis present

## 2021-06-21 DIAGNOSIS — Z79899 Other long term (current) drug therapy: Secondary | ICD-10-CM

## 2021-06-21 DIAGNOSIS — K449 Diaphragmatic hernia without obstruction or gangrene: Secondary | ICD-10-CM | POA: Diagnosis present

## 2021-06-21 DIAGNOSIS — Z811 Family history of alcohol abuse and dependence: Secondary | ICD-10-CM

## 2021-06-21 DIAGNOSIS — G40909 Epilepsy, unspecified, not intractable, without status epilepticus: Secondary | ICD-10-CM | POA: Diagnosis present

## 2021-06-21 DIAGNOSIS — Z9841 Cataract extraction status, right eye: Secondary | ICD-10-CM

## 2021-06-21 DIAGNOSIS — E871 Hypo-osmolality and hyponatremia: Secondary | ICD-10-CM | POA: Diagnosis present

## 2021-06-21 DIAGNOSIS — Z20822 Contact with and (suspected) exposure to covid-19: Secondary | ICD-10-CM | POA: Diagnosis present

## 2021-06-21 DIAGNOSIS — R54 Age-related physical debility: Secondary | ICD-10-CM | POA: Diagnosis present

## 2021-06-21 DIAGNOSIS — N138 Other obstructive and reflux uropathy: Secondary | ICD-10-CM | POA: Diagnosis present

## 2021-06-21 DIAGNOSIS — N179 Acute kidney failure, unspecified: Secondary | ICD-10-CM | POA: Diagnosis present

## 2021-06-21 DIAGNOSIS — Z961 Presence of intraocular lens: Secondary | ICD-10-CM | POA: Diagnosis present

## 2021-06-21 DIAGNOSIS — B9620 Unspecified Escherichia coli [E. coli] as the cause of diseases classified elsewhere: Secondary | ICD-10-CM | POA: Diagnosis not present

## 2021-06-21 DIAGNOSIS — M419 Scoliosis, unspecified: Secondary | ICD-10-CM | POA: Diagnosis present

## 2021-06-21 DIAGNOSIS — Z825 Family history of asthma and other chronic lower respiratory diseases: Secondary | ICD-10-CM

## 2021-06-21 DIAGNOSIS — M159 Polyosteoarthritis, unspecified: Secondary | ICD-10-CM | POA: Diagnosis present

## 2021-06-21 DIAGNOSIS — E861 Hypovolemia: Secondary | ICD-10-CM | POA: Diagnosis present

## 2021-06-21 DIAGNOSIS — H9190 Unspecified hearing loss, unspecified ear: Secondary | ICD-10-CM | POA: Diagnosis present

## 2021-06-21 DIAGNOSIS — K5900 Constipation, unspecified: Secondary | ICD-10-CM

## 2021-06-21 DIAGNOSIS — G8929 Other chronic pain: Secondary | ICD-10-CM | POA: Diagnosis present

## 2021-06-21 DIAGNOSIS — R7881 Bacteremia: Secondary | ICD-10-CM | POA: Diagnosis not present

## 2021-06-21 DIAGNOSIS — G4733 Obstructive sleep apnea (adult) (pediatric): Secondary | ICD-10-CM | POA: Diagnosis not present

## 2021-06-21 DIAGNOSIS — R6521 Severe sepsis with septic shock: Secondary | ICD-10-CM | POA: Diagnosis present

## 2021-06-21 DIAGNOSIS — Z7983 Long term (current) use of bisphosphonates: Secondary | ICD-10-CM

## 2021-06-21 DIAGNOSIS — Z96653 Presence of artificial knee joint, bilateral: Secondary | ICD-10-CM | POA: Diagnosis present

## 2021-06-21 DIAGNOSIS — N401 Enlarged prostate with lower urinary tract symptoms: Secondary | ICD-10-CM | POA: Diagnosis present

## 2021-06-21 DIAGNOSIS — N319 Neuromuscular dysfunction of bladder, unspecified: Secondary | ICD-10-CM | POA: Diagnosis present

## 2021-06-21 DIAGNOSIS — T83511A Infection and inflammatory reaction due to indwelling urethral catheter, initial encounter: Secondary | ICD-10-CM | POA: Diagnosis present

## 2021-06-21 DIAGNOSIS — Z833 Family history of diabetes mellitus: Secondary | ICD-10-CM

## 2021-06-21 DIAGNOSIS — Z8249 Family history of ischemic heart disease and other diseases of the circulatory system: Secondary | ICD-10-CM

## 2021-06-21 DIAGNOSIS — R6520 Severe sepsis without septic shock: Secondary | ICD-10-CM | POA: Diagnosis not present

## 2021-06-21 DIAGNOSIS — E785 Hyperlipidemia, unspecified: Secondary | ICD-10-CM | POA: Diagnosis present

## 2021-06-21 DIAGNOSIS — Z823 Family history of stroke: Secondary | ICD-10-CM | POA: Diagnosis not present

## 2021-06-21 DIAGNOSIS — Z888 Allergy status to other drugs, medicaments and biological substances status: Secondary | ICD-10-CM | POA: Diagnosis not present

## 2021-06-21 DIAGNOSIS — E876 Hypokalemia: Secondary | ICD-10-CM | POA: Diagnosis not present

## 2021-06-21 LAB — CBC WITH DIFFERENTIAL/PLATELET
Abs Immature Granulocytes: 0.16 10*3/uL — ABNORMAL HIGH (ref 0.00–0.07)
Basophils Absolute: 0 10*3/uL (ref 0.0–0.1)
Basophils Relative: 0 %
Eosinophils Absolute: 0 10*3/uL (ref 0.0–0.5)
Eosinophils Relative: 0 %
HCT: 34.1 % — ABNORMAL LOW (ref 39.0–52.0)
Hemoglobin: 11.1 g/dL — ABNORMAL LOW (ref 13.0–17.0)
Immature Granulocytes: 1 %
Lymphocytes Relative: 3 %
Lymphs Abs: 0.5 10*3/uL — ABNORMAL LOW (ref 0.7–4.0)
MCH: 30.6 pg (ref 26.0–34.0)
MCHC: 32.6 g/dL (ref 30.0–36.0)
MCV: 93.9 fL (ref 80.0–100.0)
Monocytes Absolute: 1 10*3/uL (ref 0.1–1.0)
Monocytes Relative: 6 %
Neutro Abs: 14.4 10*3/uL — ABNORMAL HIGH (ref 1.7–7.7)
Neutrophils Relative %: 90 %
Platelets: 100 10*3/uL — ABNORMAL LOW (ref 150–400)
RBC: 3.63 MIL/uL — ABNORMAL LOW (ref 4.22–5.81)
RDW: 14 % (ref 11.5–15.5)
WBC: 16.1 10*3/uL — ABNORMAL HIGH (ref 4.0–10.5)
nRBC: 0 % (ref 0.0–0.2)

## 2021-06-21 LAB — COMPREHENSIVE METABOLIC PANEL
ALT: 17 U/L (ref 0–44)
AST: 35 U/L (ref 15–41)
Albumin: 3.3 g/dL — ABNORMAL LOW (ref 3.5–5.0)
Alkaline Phosphatase: 65 U/L (ref 38–126)
Anion gap: 7 (ref 5–15)
BUN: 41 mg/dL — ABNORMAL HIGH (ref 8–23)
CO2: 27 mmol/L (ref 22–32)
Calcium: 8.5 mg/dL — ABNORMAL LOW (ref 8.9–10.3)
Chloride: 95 mmol/L — ABNORMAL LOW (ref 98–111)
Creatinine, Ser: 2.27 mg/dL — ABNORMAL HIGH (ref 0.61–1.24)
GFR, Estimated: 28 mL/min — ABNORMAL LOW (ref >60)
Glucose, Bld: 224 mg/dL — ABNORMAL HIGH (ref 70–99)
Potassium: 4.2 mmol/L (ref 3.5–5.1)
Sodium: 129 mmol/L — ABNORMAL LOW (ref 135–145)
Total Bilirubin: 1.3 mg/dL — ABNORMAL HIGH (ref 0.3–1.2)
Total Protein: 6.2 g/dL — ABNORMAL LOW (ref 6.5–8.1)

## 2021-06-21 LAB — PROTIME-INR
INR: 1.4 — ABNORMAL HIGH (ref 0.8–1.2)
Prothrombin Time: 16.8 seconds — ABNORMAL HIGH (ref 11.4–15.2)

## 2021-06-21 LAB — URINALYSIS, ROUTINE W REFLEX MICROSCOPIC
Bilirubin Urine: NEGATIVE
Glucose, UA: NEGATIVE mg/dL
Ketones, ur: NEGATIVE mg/dL
Nitrite: NEGATIVE
Protein, ur: 100 mg/dL — AB
RBC / HPF: >50 RBC/hpf — ABNORMAL HIGH (ref 0–5)
Specific Gravity, Urine: 1.014 (ref 1.005–1.030)
WBC, UA: >50 WBC/hpf — ABNORMAL HIGH (ref 0–5)
pH: 5 (ref 5.0–8.0)

## 2021-06-21 LAB — LACTIC ACID, PLASMA: Lactic Acid, Venous: 2.8 mmol/L (ref 0.5–1.9)

## 2021-06-21 LAB — PROCALCITONIN: Procalcitonin: 11.47 ng/mL

## 2021-06-21 LAB — RESP PANEL BY RT-PCR (FLU A&B, COVID) ARPGX2
Influenza A by PCR: NEGATIVE
Influenza B by PCR: NEGATIVE
SARS Coronavirus 2 by RT PCR: NEGATIVE

## 2021-06-21 MED ORDER — ACETAMINOPHEN 325 MG PO TABS
650.0000 mg | ORAL_TABLET | Freq: Once | ORAL | Status: AC
  Administered 2021-06-21: 650 mg via ORAL
  Filled 2021-06-21: qty 2

## 2021-06-21 MED ORDER — HEPARIN SODIUM (PORCINE) 5000 UNIT/ML IJ SOLN
5000.0000 [IU] | Freq: Three times a day (TID) | INTRAMUSCULAR | Status: DC
  Administered 2021-06-22 – 2021-06-24 (×7): 5000 [IU] via SUBCUTANEOUS
  Filled 2021-06-21 (×7): qty 1

## 2021-06-21 MED ORDER — DULOXETINE HCL 20 MG PO CPEP
60.0000 mg | ORAL_CAPSULE | Freq: Every day | ORAL | Status: DC
  Administered 2021-06-22 – 2021-06-23 (×2): 60 mg via ORAL
  Filled 2021-06-21 (×2): qty 3

## 2021-06-21 MED ORDER — LACTATED RINGERS IV SOLN: INTRAVENOUS | Status: AC

## 2021-06-21 MED ORDER — SODIUM CHLORIDE 0.9 % IV BOLUS
1000.0000 mL | Freq: Once | INTRAVENOUS | Status: AC
  Administered 2021-06-21: 1000 mL via INTRAVENOUS

## 2021-06-21 MED ORDER — LACTATED RINGERS IV BOLUS
500.0000 mL | Freq: Once | INTRAVENOUS | Status: AC
Start: 2021-06-21 — End: 2021-06-21
  Administered 2021-06-21: 500 mL via INTRAVENOUS

## 2021-06-21 MED ORDER — FINASTERIDE 5 MG PO TABS
5.0000 mg | ORAL_TABLET | Freq: Every day | ORAL | Status: DC
  Administered 2021-06-22 – 2021-06-24 (×3): 5 mg via ORAL
  Filled 2021-06-21 (×4): qty 1

## 2021-06-21 MED ORDER — CEFEPIME HCL 2 G IV SOLR
2.0000 g | Freq: Once | INTRAVENOUS | Status: AC
  Administered 2021-06-21: 2 g via INTRAVENOUS
  Filled 2021-06-21: qty 12.5

## 2021-06-21 MED ORDER — SODIUM CHLORIDE 0.9 % IV SOLN: 2.0000 g | INTRAVENOUS | Status: DC

## 2021-06-21 MED ORDER — TAMSULOSIN HCL 0.4 MG PO CAPS: 0.4000 mg | ORAL_CAPSULE | Freq: Every day | ORAL | Status: DC

## 2021-06-21 MED ORDER — LACTATED RINGERS IV BOLUS
500.0000 mL | Freq: Once | INTRAVENOUS | Status: AC
  Administered 2021-06-21: 500 mL via INTRAVENOUS

## 2021-06-21 MED ORDER — SODIUM CHLORIDE 0.9% FLUSH
3.0000 mL | Freq: Two times a day (BID) | INTRAVENOUS | Status: DC
  Administered 2021-06-21 – 2021-06-24 (×6): 3 mL via INTRAVENOUS

## 2021-06-21 NOTE — Assessment & Plan Note (Signed)
cpap

## 2021-06-21 NOTE — Progress Notes (Signed)
Notified bedside nurse of need to draw repeat lactic acid. 

## 2021-06-21 NOTE — Consult Note (Signed)
NAME:  Dustin Turner, MRN:  [id number], DOB:  [birth year], LOS: 0 ADMISSION DATE:  06/21/2021, CONSULTATION DATE:  06/21/21 REFERRING MD:  Posey Pronto, CHIEF COMPLAINT:  Weakness, confusion   History of Present Illness:  86 year old man with hx of indwelling foley presenting with confusion, fatigue, weakness.  UA+, CT a/p without obstruction.  PCCM consulted as patient pressures are somewhat low despite fluid boluses.  Patient states mentation and weakness feel better after fluids.   Very hard of hearing.  ROS as below.  Pertinent  Medical History  Chronic pain, wheelchair bound from kyphoscoliosis, osteoporosis, DDD, s/p multiple surgeries, s/p intrathecal pain pump, s/p spinal cord stimulator   Chronic indwelling foley presumably due to poor mobility and BPH  OSA on CPAP  Significant Hospital Events: Including procedures, antibiotic start and stop dates in addition to other pertinent events   6/7 admitted  Interim History / Subjective:  Consulted  Objective   Blood pressure (!) 96/56, pulse 87, temperature (!) 100.5 F (38.1 C), temperature source Oral, resp. rate 19, SpO2 92 %.        Intake/Output Summary (Last 24 hours) at 06/21/2021 2220 Last data filed at 06/21/2021 2023 Gross per 24 hour  Intake 103.38 ml  Output --  Net 103.38 ml   There were no vitals filed for this visit.  Examination: General: frail man in NAD HENT: MMM, trachea midline Lungs: equal excursion, no accessory muscle use Cardiovascular: ext with strong pulses, warm to palpation Abdomen: soft, nontender Extremities: trace edema Neuro: weak but moves all ext GU: foley with yellow urine  Resolved Hospital Problem list   N/A  Assessment & Plan:  CAUTI with borderline Bps- MAPs in 90s at time of my eval.  Agree with IVF, abx.  Would change out foley as I do not think this has been done yet.  Would not restart flomax until we are sure Bps remain okay.  Chronic pain- PTA meds  Will have day team eyeball and  make sure continues to head in right direction  Best Practice (right click and "Reselect all SmartList Selections" daily)  Per primary  Labs   CBC: Recent Labs  Lab 06/21/21 1845  WBC 16.1*  NEUTROABS 14.4*  HGB 11.1*  HCT 34.1*  MCV 93.9  PLT 100*    Basic Metabolic Panel: Recent Labs  Lab 06/21/21 1845  NA 129*  K 4.2  CL 95*  CO2 27  GLUCOSE 224*  BUN 41*  CREATININE 2.27*  CALCIUM 8.5*   GFR: CrCl cannot be calculated (Unknown ideal weight.). Recent Labs  Lab 06/21/21 1845  PROCALCITON 11.47  WBC 16.1*  LATICACIDVEN 2.8*    Liver Function Tests: Recent Labs  Lab 06/21/21 1845  AST 35  ALT 17  ALKPHOS 65  BILITOT 1.3*  PROT 6.2*  ALBUMIN 3.3*   No results for input(s): LIPASE, AMYLASE in the last 168 hours. No results for input(s): AMMONIA in the last 168 hours.  ABG No results found for: PHART, PCO2ART, PO2ART, HCO3, TCO2, ACIDBASEDEF, O2SAT   Coagulation Profile: Recent Labs  Lab 06/21/21 1845  INR 1.4*    Cardiac Enzymes: No results for input(s): CKTOTAL, CKMB, CKMBINDEX, TROPONINI in the last 168 hours.  HbA1C: Hgb A1c MFr Bld  Date/Time Value Ref Range Status  11/19/2017 06:12 AM 6.1 (H) 4.8 - 5.6 % Final    Comment:    (NOTE) Pre diabetes:          5.7%-6.4% Diabetes:              >  6.4% Glycemic control for   <7.0% adults with diabetes   01/02/2017 05:35 AM 6.2 (H) 4.8 - 5.6 % Final    Comment:    (NOTE) Pre diabetes:          5.7%-6.4% Diabetes:              >6.4% Glycemic control for   <7.0% adults with diabetes     CBG: No results for input(s): GLUCAP in the last 168 hours.  Review of Systems:   Denies chest pain, SOB, abd pain. Very hard of hearing.  Past Medical History:  He,  has a past medical history of Abdominal pain, Abnormal gait, Advance directive in chart (10/12/2015), Anemia, Arthritis, BPH (benign prostatic hyperplasia), Cataract, Cervical spondylosis without myelopathy, Cognitive dysfunction, DDD  (degenerative disc disease), cervical (06/29/2013), Degeneration of cervical intervertebral disc, Degeneration of lumbar intervertebral disc, Depression, Excessive daytime sleepiness, Generalized osteoarthritis of multiple sites (11/19/2013), GERD (gastroesophageal reflux disease), Hallux varus, acquired, Hiatal hernia, Hyperlipidemia, Hypertension, Hypogonadism in male, Impaired functional mobility, balance, gait, and endurance (11/14/2015), Insomnia (06/29/2013), Low back pain, Lumbosacral spondylosis without myelopathy, Memory loss, Mild cognitive impairment, Neurogenic bladder, OP (osteoporosis), Organic sleep apnea, Osteoporosis (03/15/2004), Peripheral neuropathy, Radial nerve palsy, Scoliosis (06/29/2013), Seizure ([place]) (07/31/2016), Sleep apnea, Spinal compression fracture ([place]), Spinal stenosis, Spondylolisthesis, acquired, Urinary incontinence, stress, male (08/10/2015), Urinary urgency, Uses hearing aid (10/12/2015), Uses wheelchair, Venous stasis, Venous stasis dermatitis of both lower extremities (06/29/2013), Vision abnormalities, Vitamin B12 deficiency (09/27/2016), and Wrist drop, bilateral.   Surgical History:   Past Surgical History:  Procedure Laterality Date   CARPAL TUNNEL RELEASE Left 03/16/2016   Dr. Hessie Knows   CATARACT EXTRACTION W/PHACO Right 01/04/2020   Procedure: CATARACT EXTRACTION PHACO AND INTRAOCULAR LENS PLACEMENT ([place]) RIGHT;  Surgeon: Eulogio Bear, MD;  Location: [facility];  Service: Ophthalmology;  Laterality: Right;  4.77 0:34.4   CERVICAL SPINE SURGERY     CORNEAL EYE SURGERY Right 09/08/2014   [place] (Dr. Lyndel Safe)   [place]     left side   INTRATHECAL PUMP IMPLANTATION  04/28/2014   Procedure: REVISION INTRATHECAL CATH; Surgeon: Marinda Elk, MD; Location: [facility]; Service: Anes/ Pain Mgmt; Laterality: N/A;   JOINT REPLACEMENT Bilateral    bilateral knee replacements   LUMBAR SPINE SURGERY     PAIN  PUMP IMPLANTATION     multiple procedures for removal and insertion this is his 3rd pain pump   PAIN PUMP REMOVAL     multiple procedures for removal and insertion this is his 3rd pain pump   Proleive System Treatment  2006   ROTATOR CUFF REPAIR Right    x2   SPINAL CORD STIMULATOR IMPLANT     x2   THORACIC SPINE SURGERY     TONSILLECTOMY       Social History:   reports that he has never smoked. He has never used smokeless tobacco. He reports that he does not drink alcohol and does not use drugs.   Family History:  His family history includes Alcohol abuse in his brother and father; Arthritis in his sister; COPD in his mother; Diabetes type II in his brother and father; Glaucoma in his paternal uncle; Heart disease in his brother and father; Sleep apnea in his son; Stroke in his father and mother. There is no history of Kidney disease, Prostate cancer, or Macular degeneration.   Allergies Allergies  Allergen Reactions   Amitriptyline Rash   Nortriptyline Other (See  Comments) and Rash    GI  upset     Home Medications  Prior to Admission medications   Medication Sig Start Date End Date Taking? Authorizing Provider  acetaminophen (TYLENOL) 500 MG tablet Take 1,000 mg by mouth every 6 (six) hours as needed.   Yes [provider]  alum & mag hydroxide-simeth (MAALOX PLUS) 400-400-40 MG/5ML suspension Take 15 mLs by mouth every 4 (four) hours as needed. Give 30 ml by mouth daily with meals as needed 12/07/17  Yes [provider]  ammonium lactate (LAC-HYDRIN) 12 % lotion Apply 1 application topically 2 (two) times daily as needed.  12/07/17  Yes [provider]  Artificial Saliva (BIOTENE ORALBALANCE DRY MOUTH) GEL Use as directed 1 application in the mouth or throat daily as needed. Apply a pea size amount to mucous membrane for dry mouth   Yes [provider]  calcium-vitamin D (OSCAL WITH D) 500-200 MG-UNIT tablet Take 1 tablet by mouth 2 (two)  times daily.  12/07/17  Yes [provider]  capsicum (ZOSTRIX) 0.075 % topical cream Apply 1 application. topically 3 (three) times daily. 03/31/21  Yes [provider]  carbamide peroxide (DEBROX) 6.5 % OTIC solution 5 drops 2 (two) times daily. 12/09/17  Yes [provider]  diclofenac sodium (VOLTAREN) 1 % GEL Apply 2 g topically at bedtime. Apply to Bilateral shoulders 03/19/18  Yes [provider]  DULoxetine HCl 60 MG CSDR Take 1 capsule by mouth at bedtime.   Yes [provider]  finasteride (PROSCAR) 5 MG tablet Take 5 mg by mouth daily.   Yes [provider]  gabapentin (NEURONTIN) 800 MG tablet Take 800 mg by mouth daily as needed.   Yes [provider]  guaiFENesin (ROBITUSSIN) 100 MG/5ML liquid Take 10 mLs by mouth every 6 (six) hours as needed for cough or to loosen phlegm.   Yes [provider]  hypromellose (SYSTANE OVERNIGHT THERAPY) 0.3 % GEL ophthalmic ointment Place 1 application. into both eyes at bedtime.   Yes [provider]  Infant Care Products [facility] EX) Apply 1 application. topically 3 (three) times daily. Apply to penile area   Yes [provider]  ipratropium (ATROVENT) 0.06 % nasal spray Place 2 sprays into both nostrils 3 (three) times daily as needed for rhinitis.   Yes [provider]  Lysine 1000 MG TABS Take 1 tablet by mouth every other day.   Yes [provider]  Magnesium 250 MG TABS Take 1 tablet by mouth daily.   Yes [provider]  mupirocin ointment (BACTROBAN) 2 % Apply 1 application. topically 2 (two) times daily.   Yes [provider]  naloxegol oxalate (MOVANTIK) 12.5 MG TABS tablet Take 12.5 mg by mouth every morning.   Yes [provider]  ondansetron (ZOFRAN) 4 MG tablet Take 4 mg by mouth every 6 (six) hours as needed for nausea or vomiting.   Yes [provider]  oxyCODONE (OXY IR/ROXICODONE) 5 MG  immediate release tablet Take 5 mg by mouth every 6 (six) hours as needed for severe pain.   Yes [provider]  polyethylene glycol (MIRALAX / GLYCOLAX) packet Take 17 g by mouth daily.   Yes [provider]  pregabalin (LYRICA) 200 MG capsule Take 200 mg by mouth 2 (two) times daily. 10/14/20  Yes [provider]  psyllium (HYDROCIL/METAMUCIL) 95 % PACK Take 1 packet by mouth daily.   Yes [provider]  senna-docusate (SENOKOT-S) 8.6-50  MG tablet Take 1 tablet by mouth as directed. Take 1 tablet every morning Take 1 tablet at bedtime on Monday, Wednesday and Friday.   Yes [provider]  simethicone (MYLICON) 0000000 MG chewable tablet Chew 125 mg by mouth every 6 (six) hours as needed for flatulence.   Yes [provider]  tamsulosin (FLOMAX) 0.4 MG CAPS capsule Take 0.4 mg by mouth daily.    Yes [provider]  triamcinolone cream (KENALOG) 0.1 % Apply 1 application. topically 2 (two) times daily.   Yes [provider]  vitamin C (ASCORBIC ACID) 500 MG tablet Take 500 mg by mouth daily.  12/07/17  Yes [provider]  alendronate (FOSAMAX) 70 MG tablet Take 70 mg by mouth once a week. On Thursday 07/17/14   [provider]  naloxone [facility]) nasal spray 4 mg/0.1 mL Place into the nose. 11/26/19   [provider]     Critical care time: N/A

## 2021-06-21 NOTE — Assessment & Plan Note (Signed)
No AED in chart.  Seizure precautions.

## 2021-06-21 NOTE — Assessment & Plan Note (Signed)
Prn tylenol.   

## 2021-06-21 NOTE — Assessment & Plan Note (Addendum)
Pt currently had indwelling foley. U/A:  Cont cefepime per pharm protocol for sepsis.

## 2021-06-21 NOTE — Progress Notes (Signed)
Pharmacy Antibiotic Note  Dustin Turner is a 86 y.o. male admitted on 06/21/2021 with UTI.  Pharmacy has been consulted for cefepime dosing.  Plan: start cefepime 2 grams IV every 24 hours Follow renal function for needed dose adjustments    Temp (24hrs), Avg:100.5 F (38.1 C), Min:100.5 F (38.1 C), Max:100.5 F (38.1 C)  Recent Labs  Lab 06/21/21 1845  WBC 16.1*  CREATININE 2.27*  LATICACIDVEN 2.8*    CrCl cannot be calculated (Unknown ideal weight.).    Allergies  Allergen Reactions   Amitriptyline Rash   Nortriptyline Other (See Comments) and Rash    GI  upset    Antimicrobials this admission: 06/07 cefepime >>   Microbiology results: 06/07 BCx: pending 06/07 UCx: pending   Thank you for allowing pharmacy to be a part of this patient's care.  Lowella Bandy 06/21/2021 9:23 PM

## 2021-06-21 NOTE — Progress Notes (Signed)
PHARMACY -  BRIEF ANTIBIOTIC NOTE   Pharmacy has received consult(s) for cefepime from an ED provider.  The patient's profile has been reviewed for ht/wt/allergies/indication/available labs.    One time order(s) placed for cefepime 2 grams IV x 1  Further antibiotics/pharmacy consults should be ordered by admitting physician if indicated.                       Thank you, Lowella Bandy 06/21/2021  7:03 PM

## 2021-06-21 NOTE — H&P (Signed)
History and Physical    Patient: Dustin Turner [id number] DOB: Jun 12, 1935 DOA: 06/21/2021 DOS: the patient was seen and examined on 06/21/2021 PCP: System, Provider Not In  Patient coming from: Home  Chief Complaint:  Chief Complaint  Patient presents with   Weakness   HPI: Dustin Turner is a 86 y.o. male with medical history significant of  Anemia, BPH, arthritis, GERD, hypogonadism, neurogenic bladder, history of seizures, spinal stenosis and scoliosis coming for not feeling well for the past 3 days.  Patient states that he is at baseline with limited ambulation and is not able to walk very well.  He currently lives at an assisted living facility.  Patient reports that for the last few days he has been having a lot of fatigue generalized weakness tired.  But no fevers no blurred vision headaches chest pain shortness of breath abdominal pain nausea vomiting diarrhea or any other symptoms.  Chart review shows patient has an allergy to amitriptyline and nortriptyline.  Patient has an indwelling Foley for neurogenic bladder which was changed about a month ago.  Patient does have a urologist who is following him for his BPH.   Review of Systems: As mentioned in the history of present illness. All other systems reviewed and are negative.   Past Medical History:  Diagnosis Date   Abdominal pain    other specified site   Abnormal gait    Advance directive in chart 10/12/2015   Anemia    Arthritis    BPH (benign prostatic hyperplasia)    Cataract    Cervical spondylosis without myelopathy    Cognitive dysfunction    DDD (degenerative disc disease), cervical 06/29/2013   Degeneration of cervical intervertebral disc    Degeneration of lumbar intervertebral disc    Depression    Excessive daytime sleepiness    Generalized osteoarthritis of multiple sites 11/19/2013   GERD (gastroesophageal reflux disease)    Hallux varus, acquired    Hiatal hernia    Hyperlipidemia    Hypertension     (12/30/19 - pt denies)   Hypogonadism in male    Impaired functional mobility, balance, gait, and endurance 11/14/2015   uses motorized wheelchair   Insomnia 06/29/2013   Low back pain    Lumbosacral spondylosis without myelopathy    Memory loss    Mild cognitive impairment    Neurogenic bladder    OP (osteoporosis)    Organic sleep apnea    Osteoporosis 03/15/2004   Peripheral neuropathy    Radial nerve palsy    Scoliosis 06/29/2013   Seizure ([place]) 07/31/2016   Sleep apnea    CPAP to bring DOS sleep study done in [place]   Spinal compression fracture ([place])    Spinal stenosis    Spondylolisthesis, acquired    Urinary incontinence, stress, male 08/10/2015   Urinary urgency    Uses hearing aid 10/12/2015   both ears   Uses wheelchair    Venous stasis    Venous stasis dermatitis of both lower extremities 06/29/2013   Vision abnormalities    Vitamin B12 deficiency 09/27/2016   Wrist drop, bilateral    Past Surgical History:  Procedure Laterality Date   CARPAL TUNNEL RELEASE Left 03/16/2016   Dr. Hessie Knows   CATARACT EXTRACTION W/PHACO Right 01/04/2020   Procedure: CATARACT EXTRACTION PHACO AND INTRAOCULAR LENS PLACEMENT ([place]) RIGHT;  Surgeon: Eulogio Bear, MD;  Location: [facility];  Service: Ophthalmology;  Laterality: Right;  4.77 0:34.4   CERVICAL  SPINE SURGERY     CORNEAL EYE SURGERY Right 09/08/2014   [facility] (Dr. Lyndel Safe)   [place]     left side   INTRATHECAL PUMP IMPLANTATION  04/28/2014   Procedure: REVISION INTRATHECAL CATH; Surgeon: Marinda Elk, MD; Location: [facility]; Service: Anes/ Pain Mgmt; Laterality: N/A;   JOINT REPLACEMENT Bilateral    bilateral knee replacements   LUMBAR SPINE SURGERY     PAIN PUMP IMPLANTATION     multiple procedures for removal and insertion this is his 3rd pain pump   PAIN PUMP REMOVAL     multiple procedures for removal and insertion this is his 3rd pain pump   Proleive  System Treatment  2006   ROTATOR CUFF REPAIR Right    x2   SPINAL CORD STIMULATOR IMPLANT     x2   THORACIC SPINE SURGERY     TONSILLECTOMY     Social History:  reports that he has never smoked. He has never used smokeless tobacco. He reports that he does not drink alcohol and does not use drugs.  Allergies  Allergen Reactions   Amitriptyline Rash   Nortriptyline Other (See Comments) and Rash    GI  upset    Family History  Problem Relation Age of Onset   Stroke Father    Alcohol abuse Father    Diabetes type II Father    Heart disease Father        pacemaker   Diabetes type II Brother    Heart disease Brother        pacemaker   Stroke Mother    COPD Mother    Alcohol abuse Brother    Arthritis Sister    Glaucoma Paternal Uncle    Sleep apnea Son    Kidney disease Neg Hx    Prostate cancer Neg Hx    Macular degeneration Neg Hx     Prior to Admission medications   Medication Sig Start Date End Date Taking? Authorizing Provider  acetaminophen (TYLENOL) 325 MG tablet Take 975 mg by mouth every 8 (eight) hours as needed.    [provider]  alendronate (FOSAMAX) 70 MG tablet Take 70 mg by mouth once a week. On Thursday 07/17/14   [provider]  alum & mag hydroxide-simeth (MAALOX PLUS) 400-400-40 MG/5ML suspension Give 30 ml by mouth daily with meals as needed 12/07/17   [provider]  ammonium lactate (LAC-HYDRIN) 12 % lotion Apply 1 application topically 2 (two) times daily as needed.  12/07/17   [provider]  Artificial Saliva (BIOTENE ORALBALANCE DRY MOUTH) GEL Use as directed 1 application in the mouth or throat daily as needed. Apply a pea size amount to mucous membrane for dry mouth    [provider]  calcium-vitamin D (OSCAL WITH D) 500-200 MG-UNIT tablet Take 1 tablet by mouth 2 (two) times daily.  12/07/17   [provider]  carbamide peroxide (DEBROX) 6.5 % OTIC solution 5 drops 2 (two) times daily.  12/09/17   [provider]  diclofenac sodium (VOLTAREN) 1 % GEL Apply 2 g topically 3 (three) times daily as needed. Apply to Bilateral shoulders 03/19/18   [provider]  DULoxetine HCl 60 MG CSDR Take by mouth.    [provider]  finasteride (PROSCAR) 5 MG tablet Take 5 mg by mouth daily.    [provider]  gabapentin (NEURONTIN) 800 MG tablet Take 800 mg by mouth 3 (three) times  daily.    [provider]  Glycerin-Hypromellose-PEG 400 0.2-0.2-1 % SOLN Place 1 drop into both eyes See admin instructions. BID scheduled and TID PRN 12/20/17   [provider]  Magnesium 250 MG TABS Take 1 tablet by mouth daily.    [provider]  mirabegron ER (MYRBETRIQ) 50 MG TB24 tablet Take 50 mg by mouth at bedtime. 12/07/17   [provider]  MOVANTIK 25 MG TABS tablet Take by mouth. 11/21/20   [provider]  naloxegol oxalate (MOVANTIK) 12.5 MG TABS tablet Take 12.5 mg by mouth every morning.    [provider]  naloxone [facility]) nasal spray 4 mg/0.1 mL Place into the nose. 11/26/19   [provider]  Ostomy Supplies (SKIN PREP WIPES) MISC Apply topically to toes every night at bedtime 03/19/18   [provider]  polyethylene glycol (MIRALAX / GLYCOLAX) packet Take 17 g by mouth daily as needed.     [provider]  pregabalin (LYRICA) 100 MG capsule Take 100 mg by mouth daily. 100 mg am and 200 mg qhs Patient not taking: Reported on 06/21/2021    [provider]  pregabalin (LYRICA) 200 MG capsule Take 200 mg by mouth 2 (two) times daily. 10/14/20   [provider]  senna-docusate (SENOKOT-S) 8.6-50 MG tablet Take 1 tablet by mouth daily.    [provider]  Skin Protectants, Misc. (EUCERIN) cream Wash feet with soap and water, dry. Apply Eucerin cream to dry, scaly skin daily, prn    [provider]  solifenacin (VESICARE) 10 MG tablet Take by mouth daily.     [provider]  solifenacin (VESICARE) 10 MG tablet  11/23/19   [provider]  tamsulosin (FLOMAX) 0.4 MG CAPS capsule Take 0.4 mg by mouth daily.     [provider]  UNABLE TO FIND Regular diet, small portions.   Aspiration precautions 02/24/18   [provider]  vitamin C (ASCORBIC ACID) 500 MG tablet Take 500 mg by mouth daily.  12/07/17   [provider]    Physical Exam: Vitals:   06/21/21 2000 06/21/21 2030 06/21/21 2100 06/21/21 2130  BP: (!) 118/59 (!) 96/51 (!) 97/53 (!) 96/56  Pulse: 93 91 87 87  Resp: (!) 22 (!) 24 18 19   Temp:      TempSrc:      SpO2: 100% 97% 94% 92%  Physical Exam Vitals and nursing note reviewed.  Constitutional:      General: He is not in acute distress.    Appearance: Normal appearance. He is not ill-appearing, toxic-appearing or diaphoretic.  HENT:     Head: Normocephalic and atraumatic.     Right Ear: External ear normal. Decreased hearing noted.     Left Ear: External ear normal. Decreased hearing noted.     Nose: Nose normal. No nasal deformity.     Mouth/Throat:     Lips: Pink.     Mouth: Mucous membranes are moist.     Tongue: No lesions.     Pharynx: Oropharynx is clear.  Eyes:     Extraocular Movements: Extraocular movements intact.     Pupils: Pupils are equal, round, and reactive to light.  Neck:     Vascular: No carotid bruit.  Cardiovascular:     Rate and Rhythm: Normal rate and regular rhythm.     Pulses: Normal pulses.     Heart sounds: Normal heart sounds.  Pulmonary:     Effort: Pulmonary effort  is normal.     Breath sounds: Normal breath sounds.  Abdominal:     General: Bowel sounds are normal. There is no distension.     Palpations: Abdomen is soft. There is no mass.     Tenderness: There is no abdominal tenderness. There is no guarding.     Hernia: No hernia is present.  Musculoskeletal:     Right lower leg: No edema.     Left lower leg: No edema.  Skin:    General:  Skin is warm.  Neurological:     General: No focal deficit present.     Mental Status: He is alert and oriented to person, place, and time.     Cranial Nerves: Cranial nerves 2-12 are intact.     Motor: Motor function is intact.  Psychiatric:        Attention and Perception: Attention normal.        Mood and Affect: Mood normal.        Speech: Speech normal.        Behavior: Behavior normal. Behavior is cooperative.        Cognition and Memory: Cognition normal.    Data Reviewed: Results for orders placed or performed during the hospital encounter of 06/21/21 (from the past 24 hour(s))  Comprehensive metabolic panel     Status: Abnormal   Collection Time: 06/21/21  6:45 PM  Result Value Ref Range   Sodium 129 (L) 135 - 145 mmol/L   Potassium 4.2 3.5 - 5.1 mmol/L   Chloride 95 (L) 98 - 111 mmol/L   CO2 27 22 - 32 mmol/L   Glucose, Bld 224 (H) 70 - 99 mg/dL   BUN 41 (H) 8 - 23 mg/dL   Creatinine, Ser 1.74 (H) 0.61 - 1.24 mg/dL   Calcium 8.5 (L) 8.9 - 10.3 mg/dL   Total Protein 6.2 (L) 6.5 - 8.1 g/dL   Albumin 3.3 (L) 3.5 - 5.0 g/dL   AST 35 15 - 41 U/L   ALT 17 0 - 44 U/L   Alkaline Phosphatase 65 38 - 126 U/L   Total Bilirubin 1.3 (H) 0.3 - 1.2 mg/dL   GFR, Estimated 28 (L) >60 mL/min   Anion gap 7 5 - 15  Lactic acid, plasma     Status: Abnormal   Collection Time: 06/21/21  6:45 PM  Result Value Ref Range   Lactic Acid, Venous 2.8 (HH) 0.5 - 1.9 mmol/L  CBC with Differential     Status: Abnormal   Collection Time: 06/21/21  6:45 PM  Result Value Ref Range   WBC 16.1 (H) 4.0 - 10.5 K/uL   RBC 3.63 (L) 4.22 - 5.81 MIL/uL   Hemoglobin 11.1 (L) 13.0 - 17.0 g/dL   HCT 08.1 (L) 44.8 - 18.5 %   MCV 93.9 80.0 - 100.0 fL   MCH 30.6 26.0 - 34.0 pg   MCHC 32.6 30.0 - 36.0 g/dL   RDW 63.1 49.7 - 02.6 %   Platelets 100 (L) 150 - 400 K/uL   nRBC 0.0 0.0 - 0.2 %   Neutrophils Relative % 90 %   Neutro Abs 14.4 (H) 1.7 - 7.7 K/uL   Lymphocytes Relative 3 %   Lymphs Abs 0.5 (L)  0.7 - 4.0 K/uL   Monocytes Relative 6 %   Monocytes Absolute 1.0 0.1 - 1.0 K/uL   Eosinophils Relative 0 %   Eosinophils Absolute 0.0 0.0 - 0.5 K/uL   Basophils Relative 0 %  Basophils Absolute 0.0 0.0 - 0.1 K/uL   Immature Granulocytes 1 %   Abs Immature Granulocytes 0.16 (H) 0.00 - 0.07 K/uL  Protime-INR     Status: Abnormal   Collection Time: 06/21/21  6:45 PM  Result Value Ref Range   Prothrombin Time 16.8 (H) 11.4 - 15.2 seconds   INR 1.4 (H) 0.8 - 1.2  Procalcitonin - Baseline     Status: None   Collection Time: 06/21/21  6:45 PM  Result Value Ref Range   Procalcitonin 11.47 ng/mL  Urinalysis, Routine w reflex microscopic     Status: Abnormal   Collection Time: 06/21/21  6:46 PM  Result Value Ref Range   Color, Urine AMBER (A) YELLOW   APPearance CLOUDY (A) CLEAR   Specific Gravity, Urine 1.014 1.005 - 1.030   pH 5.0 5.0 - 8.0   Glucose, UA NEGATIVE NEGATIVE mg/dL   Hgb urine dipstick MODERATE (A) NEGATIVE   Bilirubin Urine NEGATIVE NEGATIVE   Ketones, ur NEGATIVE NEGATIVE mg/dL   Protein, ur 100 (A) NEGATIVE mg/dL   Nitrite NEGATIVE NEGATIVE   Leukocytes,Ua LARGE (A) NEGATIVE   RBC / HPF >50 (H) 0 - 5 RBC/hpf   WBC, UA >50 (H) 0 - 5 WBC/hpf   Bacteria, UA MANY (A) NONE SEEN   Squamous Epithelial / LPF 0-5 0 - 5   WBC Clumps PRESENT    Mucus PRESENT    Hyaline Casts, UA PRESENT    Non Squamous Epithelial PRESENT (A) NONE SEEN     Assessment and Plan: * Severe sepsis with septic shock (CODE) (HCC)  Patient meets severe sepsis with septic shock criteria. On arrival has received sepsis based IV fluid resuscitation. Blood pressure on my exam is still 96. Clinically patient states he is feeling somewhat little better than on arrival. We will continue with maintenance IV fluid, IV antibiotic regimen.  Consult critical care for vasopressor therapy. Follow culture sensitivity. GI prophylaxis. DVT prophylaxis.    AKI (acute kidney injury) [facility]) Patient coming  today with acute kidney injury which I suspect is a combination of prerenal and sepsis presentation. Again goal is for IV fluid resuscitation, maintenance of blood pressure.  Low threshold for vasopressor therapy to prevent volume overload. Patient's last echocardiogram was in 2018. We will renally dose all needed medications. Avoid contrast studies.  Avoid NSAIDs. Patient started at 75 cc of LR. Lab Results  Component Value Date   CREATININE 2.27 (H) 06/21/2021   CREATININE 0.91 11/19/2017   CREATININE 0.91 10/15/2017      Anemia  Currently stable. We will follow.  Seizures ([place]) No AED in chart.  Seizure precautions.   Bladder neurogenesis indwelling foley.  Ct today shows: IMPRESSION: Moderate-sized hiatal hernia.   Changes of mild colonic constipation.   No acute abnormality to correspond with the given clinical history is noted.  Urology consult as deemed appropriate.  Cont iv ABX.    BPH with obstruction/lower urinary tract symptoms Indwelling foley.  Cont flomax and proscar.   OSA on CPAP cpap   Arthritis Prn tylenol.    UTI (urinary tract infection) due to urinary indwelling Foley catheter ([place]) Pt currently had indwelling foley. U/A:  Cont cefepime per pharm protocol for sepsis.      Advance Care Planning:    Code Status: Full Code   Consults:  Critical care- Dr.Dan Tamala Julian informed.    Family Communication:  Enriqueta Shutter (Daughter)  [phone] (Mobile)   Severity of Illness: The appropriate patient status for this patient  is INPATIENT. Inpatient status is judged to be reasonable and necessary in order to provide the required intensity of service to ensure the patient's safety. The patient's presenting symptoms, physical exam findings, and initial radiographic and laboratory data in the context of their chronic comorbidities is felt to place them at high risk for further clinical deterioration. Furthermore, it is not anticipated that  the patient will be medically stable for discharge from the hospital within 2 midnights of admission.   * I certify that at the point of admission it is my clinical judgment that the patient will require inpatient hospital care spanning beyond 2 midnights from the point of admission due to high intensity of service, high risk for further deterioration and high frequency of surveillance required.*  Author: Para Skeans, MD 06/21/2021 10:40 PM  For on call review [email or web address].

## 2021-06-21 NOTE — Assessment & Plan Note (Signed)
  Currently stable. We will follow.

## 2021-06-21 NOTE — ED Provider Notes (Signed)
[facility] Provider Note    Event Date/Time   First MD Initiated Contact with Patient 06/21/21 1835     (approximate)   History   Weakness   HPI  Dustin Turner is a 86 y.o. male  here with generalized weakness. Pt states that for the last several days, he's felt more tired, fatigued, with less energy. No SOB, chest pain. He has chronic pain that has been persistent but not acutely worsened. He has had increasing difficulty getting around due to weakness. Wife reportedlyc alled today because he seemed more pale, fatigued, slightly confused. No known h/o GIB. Has had some chills. No known sick contacts. Lives at home with his wife.     Physical Exam   Triage Vital Signs: ED Triage Vitals [06/21/21 1838]  Enc Vitals Group     BP      Pulse      Resp      Temp      Temp src      SpO2      Weight      Height      Head Circumference      Peak Flow      Pain Score 6     Pain Loc      Pain Edu?      Excl. in [place]?     Most recent vital signs: Vitals:   06/21/21 2000 06/21/21 2030  BP: (!) 118/59 (!) 96/51  Pulse: 93 91  Resp: (!) 22 (!) 24  Temp:    SpO2: 100% 97%     General: Awake, no distress. Mild pallor noted. CV:  Good peripheral perfusion. RRR. No murmurs. Resp:  Normal effort. Lungs CTAB. No murmurs. Abd:  No distention. Abdomen soft, NT, ND. Other:  No LE edema.   ED Results / Procedures / Treatments   Labs (all labs ordered are listed, but only abnormal results are displayed) Labs Reviewed  COMPREHENSIVE METABOLIC PANEL - Abnormal; Notable for the following components:      Result Value   Sodium 129 (*)    Chloride 95 (*)    Glucose, Bld 224 (*)    BUN 41 (*)    Creatinine, Ser 2.27 (*)    Calcium 8.5 (*)    Total Protein 6.2 (*)    Albumin 3.3 (*)    Total Bilirubin 1.3 (*)    GFR, Estimated 28 (*)    All other components within normal limits  LACTIC ACID, PLASMA - Abnormal; Notable for the following components:    Lactic Acid, Venous 2.8 (*)    All other components within normal limits  CBC WITH DIFFERENTIAL/PLATELET - Abnormal; Notable for the following components:   WBC 16.1 (*)    RBC 3.63 (*)    Hemoglobin 11.1 (*)    HCT 34.1 (*)    Platelets 100 (*)    Neutro Abs 14.4 (*)    Lymphs Abs 0.5 (*)    Abs Immature Granulocytes 0.16 (*)    All other components within normal limits  PROTIME-INR - Abnormal; Notable for the following components:   Prothrombin Time 16.8 (*)    INR 1.4 (*)    All other components within normal limits  URINALYSIS, ROUTINE W REFLEX MICROSCOPIC - Abnormal; Notable for the following components:   Color, Urine AMBER (*)    APPearance CLOUDY (*)    Hgb urine dipstick MODERATE (*)    Protein, ur 100 (*)    Leukocytes,Ua  LARGE (*)    RBC / HPF >50 (*)    WBC, UA >50 (*)    Bacteria, UA MANY (*)    Non Squamous Epithelial PRESENT (*)    All other components within normal limits  CULTURE, BLOOD (ROUTINE X 2)  CULTURE, BLOOD (ROUTINE X 2)  URINE CULTURE  RESP PANEL BY RT-PCR (FLU A&B, COVID) ARPGX2  PROCALCITONIN  LACTIC ACID, PLASMA     EKG Normal sinus rhythm, ventricular rate 99.  PR 170, QRS 156, QTc 477.  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  No ST elevations or depressions.   RADIOLOGY Chest x-ray: Streaky basilar opacities likely due to atelectasis   I also independently reviewed and agree with radiologist interpretations.   PROCEDURES:  Critical Care performed: Yes, see critical care procedure note(s)  .Critical Care Performed by: Duffy Bruce, MD Authorized by: Duffy Bruce, MD   Critical care provider statement:    Critical care time (minutes):  30   Critical care time was exclusive of:  Separately billable procedures and treating other patients   Critical care was necessary to treat or prevent imminent or life-threatening deterioration of the following conditions:  Respiratory failure, circulatory failure, cardiac  failure and sepsis   Critical care was time spent personally by me on the following activities:  Development of treatment plan with patient or surrogate, discussions with consultants, evaluation of patient's response to treatment, examination of patient, ordering and review of laboratory studies, ordering and review of radiographic studies, ordering and performing treatments and interventions, pulse oximetry, re-evaluation of patient's condition and review of old charts   I assumed direction of critical care for this patient from another provider in my specialty: no     Care discussed with: admitting provider      MEDICATIONS ORDERED IN ED: Medications  sodium chloride 0.9 % bolus 1,000 mL (1,000 mLs Intravenous Bolus 06/21/21 1930)  acetaminophen (TYLENOL) tablet 650 mg (650 mg Oral Given 06/21/21 1928)  ceFEPIme (MAXIPIME) 2 g in sodium chloride 0.9 % 100 mL IVPB (0 g Intravenous Stopped 06/21/21 2023)  sodium chloride 0.9 % bolus 1,000 mL (1,000 mLs Intravenous Bolus 06/21/21 1935)  lactated ringers bolus 500 mL (500 mLs Intravenous Bolus 06/21/21 2040)     IMPRESSION / MDM / [place] / ED COURSE  I reviewed the triage vital signs and the nursing notes.                               The patient is on the cardiac monitor to evaluate for evidence of arrhythmia and/or significant heart rate changes.   Ddx:  Differential includes the following, with pertinent life- or limb-threatening emergencies considered:  Sepsis 2/2 UTI, PNA, metabolic encephalopathy, anemia, AKI, polypharmacy, hyperthyroidism  Patient's presentation is most consistent with acute presentation with potential threat to life or bodily function.  MDM:  86 yo M with PMHx HTN, HLD, chornic pain, here with AMS, fatigue, weakness. Suspect acute sepsis 2/2 UTI, likely from indwelling foley. No CVAT. Pt mildly confused intermittently but otherwise alert on exam, protecting airway. Pt febrile, tachycardic on arrival  meeting sepsis criteria. Sepsis protocol initiated with cefepime for CA-UTI. CMP remarkable for AKI, hyponatremia likely from hypovolemia. CBC shows leukocytosis to 16.1k. LA elevated at 2.8. Procal +11. CXR clear. CT renal stone obtained, shows no obstruction or surgical abnormalities. UA c/w UTI. UCx, BCx sent. Pt given 30 cc/kg fluids, IV ABX, and  will admit to Hospitalist service. Pt updated and is in agreement, as well as family.   MEDICATIONS GIVEN IN ED: Medications  sodium chloride 0.9 % bolus 1,000 mL (1,000 mLs Intravenous Bolus 06/21/21 1930)  acetaminophen (TYLENOL) tablet 650 mg (650 mg Oral Given 06/21/21 1928)  ceFEPIme (MAXIPIME) 2 g in sodium chloride 0.9 % 100 mL IVPB (0 g Intravenous Stopped 06/21/21 2023)  sodium chloride 0.9 % bolus 1,000 mL (1,000 mLs Intravenous Bolus 06/21/21 1935)  lactated ringers bolus 500 mL (500 mLs Intravenous Bolus 06/21/21 2040)     Consults:  Hospitalist   EMR reviewed  Pain Management clinic notes from [place] IMPRESSION(S) / ED DIAGNOSES   Final diagnoses:  Urinary tract infection associated with indwelling urethral catheter, initial encounter ([place])  Sepsis without acute organ dysfunction, due to unspecified organism [facility])     Rx / DC Orders   ED Discharge Orders     None        Note:  This document was prepared using Dragon voice recognition software and may include unintentional dictation errors.   Duffy Bruce, MD 06/21/21 2045

## 2021-06-21 NOTE — Assessment & Plan Note (Addendum)
  Patient meets severe sepsis with septic shock criteria. On arrival has received sepsis based IV fluid resuscitation. Blood pressure on my exam is still 96. Clinically patient states he is feeling somewhat little better than on arrival. We will continue with maintenance IV fluid, IV antibiotic regimen.  Consult critical care for vasopressor therapy. Follow culture sensitivity. GI prophylaxis. DVT prophylaxis.

## 2021-06-21 NOTE — Assessment & Plan Note (Signed)
indwelling foley.  Ct today shows: IMPRESSION: Moderate-sized hiatal hernia.  Changes of mild colonic constipation.  No acute abnormality to correspond with the given clinical history is noted.  Urology consult as deemed appropriate.  Cont iv ABX.

## 2021-06-21 NOTE — Assessment & Plan Note (Signed)
Indwelling foley.  Cont flomax and proscar.

## 2021-06-21 NOTE — ED Triage Notes (Signed)
Pt states that he has had generalized weakness for 3 days.  Staff at [facility] said he was generally weak since returning from a MD appointment today.  Pt lives in [place] and his wife met him at the MD appointment today and noted that he was pale and generally weak.  Pt has foley cath and has had this for a year and he is not able to tell me when it was last changed.  

## 2021-06-21 NOTE — Assessment & Plan Note (Addendum)
Patient coming today with acute kidney injury which I suspect is a combination of prerenal and sepsis presentation. Again goal is for IV fluid resuscitation, maintenance of blood pressure.  Low threshold for vasopressor therapy to prevent volume overload. Patient's last echocardiogram was in 2018. We will renally dose all needed medications. Avoid contrast studies.  Avoid NSAIDs. Patient started at 75 cc of LR. Lab Results  Component Value Date   CREATININE 2.27 (H) 06/21/2021   CREATININE 0.91 11/19/2017   CREATININE 0.91 10/15/2017

## 2021-06-21 NOTE — Hospital Course (Signed)
Ed signout: Septic CAUTI. Foley changed a month ago. C/h urinary retention indwelling for last few months.  Pt does have a urology.  Pt was using a condom cath before and changed to foley.  AKI/ lactic elevated. BP has been ok and 95-115 systolic.

## 2021-06-22 ENCOUNTER — Encounter: Payer: Self-pay | Admitting: Internal Medicine

## 2021-06-22 DIAGNOSIS — N179 Acute kidney failure, unspecified: Secondary | ICD-10-CM | POA: Diagnosis not present

## 2021-06-22 DIAGNOSIS — R6521 Severe sepsis with septic shock: Secondary | ICD-10-CM

## 2021-06-22 DIAGNOSIS — T83511A Infection and inflammatory reaction due to indwelling urethral catheter, initial encounter: Secondary | ICD-10-CM | POA: Diagnosis not present

## 2021-06-22 DIAGNOSIS — A419 Sepsis, unspecified organism: Secondary | ICD-10-CM | POA: Diagnosis not present

## 2021-06-22 DIAGNOSIS — N390 Urinary tract infection, site not specified: Secondary | ICD-10-CM

## 2021-06-22 DIAGNOSIS — R6520 Severe sepsis without septic shock: Secondary | ICD-10-CM

## 2021-06-22 DIAGNOSIS — R7881 Bacteremia: Secondary | ICD-10-CM | POA: Diagnosis present

## 2021-06-22 LAB — BLOOD CULTURE ID PANEL (REFLEXED) - BCID2

## 2021-06-22 LAB — BASIC METABOLIC PANEL
Anion gap: 4 — ABNORMAL LOW (ref 5–15)
BUN: 35 mg/dL — ABNORMAL HIGH (ref 8–23)
CO2: 28 mmol/L (ref 22–32)
Calcium: 7.8 mg/dL — ABNORMAL LOW (ref 8.9–10.3)
Chloride: 101 mmol/L (ref 98–111)
Creatinine, Ser: 1.66 mg/dL — ABNORMAL HIGH (ref 0.61–1.24)
GFR, Estimated: 40 mL/min — ABNORMAL LOW (ref >60)
Glucose, Bld: 143 mg/dL — ABNORMAL HIGH (ref 70–99)
Potassium: 3.6 mmol/L (ref 3.5–5.1)
Sodium: 133 mmol/L — ABNORMAL LOW (ref 135–145)

## 2021-06-22 LAB — PROTIME-INR
INR: 1.4 — ABNORMAL HIGH (ref 0.8–1.2)
Prothrombin Time: 17 seconds — ABNORMAL HIGH (ref 11.4–15.2)

## 2021-06-22 LAB — CORTISOL-AM, BLOOD: Cortisol - AM: 27.4 ug/dL — ABNORMAL HIGH (ref 6.7–22.6)

## 2021-06-22 LAB — LACTIC ACID, PLASMA: Lactic Acid, Venous: 1.4 mmol/L (ref 0.5–1.9)

## 2021-06-22 LAB — MAGNESIUM: Magnesium: 1.9 mg/dL (ref 1.7–2.4)

## 2021-06-22 MED ORDER — PREGABALIN 75 MG PO CAPS
200.0000 mg | ORAL_CAPSULE | Freq: Two times a day (BID) | ORAL | Status: DC
  Administered 2021-06-22 – 2021-06-24 (×5): 200 mg via ORAL
  Filled 2021-06-22 (×5): qty 1

## 2021-06-22 MED ORDER — ACETAMINOPHEN 325 MG PO TABS
650.0000 mg | ORAL_TABLET | Freq: Four times a day (QID) | ORAL | Status: DC | PRN
  Administered 2021-06-22 – 2021-06-23 (×2): 650 mg via ORAL
  Filled 2021-06-22 (×2): qty 2

## 2021-06-22 MED ORDER — SODIUM CHLORIDE 0.9 % IV SOLN
2.0000 g | Freq: Every day | INTRAVENOUS | Status: DC
  Administered 2021-06-22 – 2021-06-24 (×3): 2 g via INTRAVENOUS
  Filled 2021-06-22 (×3): qty 20

## 2021-06-22 MED ORDER — CHLORHEXIDINE GLUCONATE CLOTH 2 % EX PADS
6.0000 | MEDICATED_PAD | Freq: Every day | CUTANEOUS | Status: DC
  Administered 2021-06-22 – 2021-06-24 (×2): 6 via TOPICAL

## 2021-06-22 MED ORDER — CEFEPIME HCL 2 G IV SOLR
2.0000 g | Freq: Two times a day (BID) | INTRAVENOUS | Status: DC
Start: 2021-06-22 — End: 2021-06-22

## 2021-06-22 NOTE — Progress Notes (Addendum)
Progress Note    Dustin Turner  ZTI:[id number] DOB: 1935/12/25  DOA: 06/21/2021 PCP: System, Provider Not In      Brief Narrative:    Medical records reviewed and are as summarized below:  Dustin Turner is a 86 y.o. male with medical history significant for chronic anemia, BPH, neurogenic bladder with chronic indwelling Foley catheter, history of UTI since Foley catheter was placed, CAD, hypogonadism, seizure disorder, spinal stenosis, scoliosis, who presented to the hospital with generalized weakness, fatigue and not feeling well for about 3 days duration.  He said he has had indwelling Foley catheter for about a year and this was changed about a month prior to admission.  He was febrile with temperature of 100.5 F in the ED.  He was found to have severe sepsis secondary to acute complicated UTI complicated by hypotension and AKI. He was treated with IV fluids and empiric IV antibiotics.  Blood culture showed E. coli.  consistent with E. coli bacteremia.     Assessment/Plan:   Principal Problem:   Severe sepsis (HCC) Active Problems:   E coli bacteremia   AKI (acute kidney injury) (HCC)   Anemia   Seizures (HCC)   Bladder neurogenesis   BPH with obstruction/lower urinary tract symptoms   OSA on CPAP   Arthritis   UTI (urinary tract infection) due to urinary indwelling Foley catheter (HCC)    Body mass index is 29.35 kg/m.   Severe sepsis secondary to catheter associated UTI and E. coli bacteremia: Change IV cefepime to IV ceftriaxone.  AKI: Improving.  Continue IV fluids and monitor BMP.  Hypotension: BP still low.  He did not require vasopressors.  Continue IV fluids and monitor BP closely.  BPH, neurogenic bladder: Foley catheter was changed on 06/21/2021.  Continue finasteride.  Other comorbidities include chronic anemia, seizure disorder, OSA on CPAP, arthritis   Diet Order             Diet regular Room service appropriate? Yes; Fluid consistency: Thin   Diet effective now                            Consultants: Intensivist  Procedures: None    Medications:    DULoxetine  60 mg Oral QHS   finasteride  5 mg Oral Daily   heparin  5,000 Units Subcutaneous Q8H   pregabalin  200 mg Oral BID   sodium chloride flush  3 mL Intravenous Q12H   Continuous Infusions:  cefTRIAXone (ROCEPHIN)  IV     lactated ringers 75 mL/hr at 06/22/21 0406     Anti-infectives (From admission, onward)    Start     Dose/Rate Route Frequency Ordered Stop   06/22/21 2000  ceFEPIme (MAXIPIME) 2 g in sodium chloride 0.9 % 100 mL IVPB  Status:  Discontinued        2 g 200 mL/hr over 30 Minutes Intravenous Every 24 hours 06/21/21 2130 06/22/21 0740   06/22/21 1100  cefTRIAXone (ROCEPHIN) 2 g in sodium chloride 0.9 % 100 mL IVPB        2 g 200 mL/hr over 30 Minutes Intravenous Daily 06/22/21 1026     06/22/21 1000  ceFEPIme (MAXIPIME) 2 g in sodium chloride 0.9 % 100 mL IVPB  Status:  Discontinued        2 g 200 mL/hr over 30 Minutes Intravenous Every 12 hours 06/22/21 0740 06/22/21 1026   06/21/21 1915  ceFEPIme (MAXIPIME) 2 g in sodium chloride 0.9 % 100 mL IVPB        2 g 200 mL/hr over 30 Minutes Intravenous  Once 06/21/21 1904 06/21/21 2023              Family Communication/Anticipated D/C date and plan/Code Status   DVT prophylaxis: heparin injection 5,000 Units Start: 06/21/21 2215     Code Status: Full Code  Family Communication: None Disposition Plan: Plan to discharge to assisted living facility in 1 to 2 days   Status is: Inpatient Remains inpatient appropriate because: On IV antibiotics       Subjective:   Interval events noted.  He complains of generalized weakness.  He also reports chronic back pain and chronic abdominal pain (of unclear etiology).   Objective:    Vitals:   06/22/21 0600 06/22/21 0700 06/22/21 0758 06/22/21 1105  BP: 93/61 96/67 92/70  (!) 96/48  Pulse: 76 74 72 72  Resp: 17 16  17 18   Temp:   99.3 F (37.4 C) 98.4 F (36.9 C)  TempSrc:   Oral   SpO2: 94% 95% 96% 99%  Weight:  80 kg    Height:  5\' 5"  (1.651 m)     No data found.   Intake/Output Summary (Last 24 hours) at 06/22/2021 1136 Last data filed at 06/22/2021 [id number] Gross per 24 hour  Intake 103.38 ml  Output 1 ml  Net 102.38 ml   Filed Weights   06/22/21 0700  Weight: 80 kg    Exam:  GEN: NAD SKIN: No rash EYES: EOMI ENT: MMM CV: RRR PULM: CTA B ABD: soft, ND, NT, +BS, +pain pump in the abdomen CNS: AAO x 3, non focal EXT: No edema or tenderness GU: Foley catheter draining amber urine        Data Reviewed:   I have personally reviewed following labs and imaging studies:  Labs: Labs show the following:   Basic Metabolic Panel: Recent Labs  Lab 06/21/21 1845 06/22/21 0446  NA 129* 133*  K 4.2 3.6  CL 95* 101  CO2 27 28  GLUCOSE 224* 143*  BUN 41* 35*  CREATININE 2.27* 1.66*  CALCIUM 8.5* 7.8*  MG  --  1.9   GFR Estimated Creatinine Clearance: 32.3 mL/min (A) (by C-G formula based on SCr of 1.66 mg/dL (H)). Liver Function Tests: Recent Labs  Lab 06/21/21 1845  AST 35  ALT 17  ALKPHOS 65  BILITOT 1.3*  PROT 6.2*  ALBUMIN 3.3*   No results for input(s): "LIPASE", "AMYLASE" in the last 168 hours. No results for input(s): "AMMONIA" in the last 168 hours. Coagulation profile Recent Labs  Lab 06/21/21 1845 06/22/21 0446  INR 1.4* 1.4*    CBC: Recent Labs  Lab 06/21/21 1845  WBC 16.1*  NEUTROABS 14.4*  HGB 11.1*  HCT 34.1*  MCV 93.9  PLT 100*   Cardiac Enzymes: No results for input(s): "CKTOTAL", "CKMB", "CKMBINDEX", "TROPONINI" in the last 168 hours. BNP (last 3 results) No results for input(s): "PROBNP" in the last 8760 hours. CBG: No results for input(s): "GLUCAP" in the last 168 hours. D-Dimer: No results for input(s): "DDIMER" in the last 72 hours. Hgb A1c: No results for input(s): "HGBA1C" in the last 72 hours. Lipid Profile: No  results for input(s): "CHOL", "HDL", "LDLCALC", "TRIG", "CHOLHDL", "LDLDIRECT" in the last 72 hours. Thyroid function studies: No results for input(s): "TSH", "T4TOTAL", "T3FREE", "THYROIDAB" in the last 72 hours.  Invalid input(s): "FREET3" Anemia work up:  No results for input(s): "VITAMINB12", "FOLATE", "FERRITIN", "TIBC", "IRON", "RETICCTPCT" in the last 72 hours. Sepsis Labs: Recent Labs  Lab 06/21/21 1845 06/22/21 0113  PROCALCITON 11.47  --   WBC 16.1*  --   LATICACIDVEN 2.8* 1.4    Microbiology Recent Results (from the past 240 hour(s))  Culture, blood (Routine x 2)     Status: None (Preliminary result)   Collection Time: 06/21/21  6:45 PM   Specimen: BLOOD  Result Value Ref Range Status   Specimen Description BLOOD LEFT ANTECUBITAL  Final   Special Requests   Final    BOTTLES DRAWN AEROBIC AND ANAEROBIC Blood Culture adequate volume   Culture  Setup Time   Final    Organism ID to follow GRAM NEGATIVE RODS IN BOTH AEROBIC AND ANAEROBIC BOTTLES CRITICAL RESULT CALLED TO, READ BACK BY AND VERIFIED WITH: DEVAN MITCHELL ON 08/20/21 @0900  RP Performed at [facility], [address] [place], [place]    Culture GRAM NEGATIVE RODS  Final   Report Status PENDING  Incomplete  Blood Culture ID Panel (Reflexed)     Status: Abnormal   Collection Time: 06/21/21  6:45 PM  Result Value Ref Range Status   Enterococcus faecalis NOT DETECTED NOT DETECTED Final   Enterococcus Faecium NOT DETECTED NOT DETECTED Final   Listeria monocytogenes NOT DETECTED NOT DETECTED Final   Staphylococcus species NOT DETECTED NOT DETECTED Final   Staphylococcus aureus (BCID) NOT DETECTED NOT DETECTED Final   Staphylococcus epidermidis NOT DETECTED NOT DETECTED Final   Staphylococcus lugdunensis NOT DETECTED NOT DETECTED Final   Streptococcus species NOT DETECTED NOT DETECTED Final   Streptococcus agalactiae NOT DETECTED NOT DETECTED Final   Streptococcus pneumoniae NOT DETECTED  NOT DETECTED Final   Streptococcus pyogenes NOT DETECTED NOT DETECTED Final   A.calcoaceticus-baumannii NOT DETECTED NOT DETECTED Final   Bacteroides fragilis NOT DETECTED NOT DETECTED Final   Enterobacterales DETECTED (A) NOT DETECTED Final    Comment: Enterobacterales represent a large order of gram negative bacteria, not a single organism. CRITICAL RESULT CALLED TO, READ BACK BY AND VERIFIED WITH: DEVAN MITCHELL ON 06/22/21 @0900  RP    Enterobacter cloacae complex NOT DETECTED NOT DETECTED Final   Escherichia coli DETECTED (A) NOT DETECTED Final    Comment: CRITICAL RESULT CALLED TO, READ BACK BY AND VERIFIED WITH: DEVAN MITCHELL ON 06/22/21 @0900  RP    Klebsiella aerogenes NOT DETECTED NOT DETECTED Final   Klebsiella oxytoca NOT DETECTED NOT DETECTED Final   Klebsiella pneumoniae NOT DETECTED NOT DETECTED Final   Proteus species NOT DETECTED NOT DETECTED Final   Salmonella species NOT DETECTED NOT DETECTED Final   Serratia marcescens NOT DETECTED NOT DETECTED Final   Haemophilus influenzae NOT DETECTED NOT DETECTED Final   Neisseria meningitidis NOT DETECTED NOT DETECTED Final   Pseudomonas aeruginosa NOT DETECTED NOT DETECTED Final   Stenotrophomonas maltophilia NOT DETECTED NOT DETECTED Final   Candida albicans NOT DETECTED NOT DETECTED Final   Candida auris NOT DETECTED NOT DETECTED Final   Candida glabrata NOT DETECTED NOT DETECTED Final   Candida krusei NOT DETECTED NOT DETECTED Final   Candida parapsilosis NOT DETECTED NOT DETECTED Final   Candida tropicalis NOT DETECTED NOT DETECTED Final   Cryptococcus neoformans/gattii NOT DETECTED NOT DETECTED Final   CTX-M ESBL NOT DETECTED NOT DETECTED Final   Carbapenem resistance IMP NOT DETECTED NOT DETECTED Final   Carbapenem resistance KPC NOT DETECTED NOT DETECTED Final   Carbapenem resistance NDM NOT DETECTED NOT DETECTED Final  Carbapenem resist OXA 48 LIKE NOT DETECTED NOT DETECTED Final   Carbapenem resistance VIM NOT  DETECTED NOT DETECTED Final    Comment: Performed at [facility], [address] [place], [place] [address]  Resp Panel by RT-PCR (Flu A&B, Covid) Anterior Nasal Swab     Status: None   Collection Time: 06/21/21  9:33 PM   Specimen: Anterior Nasal Swab  Result Value Ref Range Status   SARS Coronavirus 2 by RT PCR NEGATIVE NEGATIVE Final    Comment: (NOTE) SARS-CoV-2 target nucleic acids are NOT DETECTED.  The SARS-CoV-2 RNA is generally detectable in upper respiratory specimens during the acute phase of infection. The lowest concentration of SARS-CoV-2 viral copies this assay can detect is 138 copies/mL. A negative result does not preclude SARS-Cov-2 infection and should not be used as the sole basis for treatment or other patient management decisions. A negative result may occur with  improper specimen collection/handling, submission of specimen other than nasopharyngeal swab, presence of viral mutation(s) within the areas targeted by this assay, and inadequate number of viral copies(<138 copies/mL). A negative result must be combined with clinical observations, patient history, and epidemiological information. The expected result is Negative.  Fact Sheet for Patients:  BloggerCourse.com  Fact Sheet for Healthcare Providers:  SeriousBroker.it  This test is no t yet approved or cleared by the [place] FDA and  has been authorized for detection and/or diagnosis of SARS-CoV-2 by FDA under an Emergency Use Authorization (EUA). This EUA will remain  in effect (meaning this test can be used) for the duration of the COVID-19 declaration under Section 564(b)(1) of the Act, 21 U.S.C.section 360bbb-3(b)(1), unless the authorization is terminated  or revoked sooner.       Influenza A by PCR NEGATIVE NEGATIVE Final   Influenza B by PCR NEGATIVE NEGATIVE Final    Comment: (NOTE) The Xpert Xpress SARS-CoV-2/FLU/RSV plus  assay is intended as an aid in the diagnosis of influenza from Nasopharyngeal swab specimens and should not be used as a sole basis for treatment. Nasal washings and aspirates are unacceptable for Xpert Xpress SARS-CoV-2/FLU/RSV testing.  Fact Sheet for Patients: BloggerCourse.com  Fact Sheet for Healthcare Providers: SeriousBroker.it  This test is not yet approved or cleared by the [place] FDA and has been authorized for detection and/or diagnosis of SARS-CoV-2 by FDA under an Emergency Use Authorization (EUA). This EUA will remain in effect (meaning this test can be used) for the duration of the COVID-19 declaration under Section 564(b)(1) of the Act, 21 U.S.C. section 360bbb-3(b)(1), unless the authorization is terminated or revoked.  Performed at [facility], [address] [place], [place] [address]     Procedures and diagnostic studies:  CT Renal Stone Study  Result Date: 06/21/2021 CLINICAL DATA:  Generalized weakness and flank pain for several days, initial encounter EXAM: CT ABDOMEN AND PELVIS WITHOUT CONTRAST TECHNIQUE: Multidetector CT imaging of the abdomen and pelvis was performed following the standard protocol without IV contrast. RADIATION DOSE REDUCTION: This exam was performed according to the departmental dose-optimization program which includes automated exposure control, adjustment of the mA and/or kV according to patient size and/or use of iterative reconstruction technique. COMPARISON:  CT of the chest from 04/23/2017 FINDINGS: Lower chest: Lung bases demonstrate evidence of a moderate-sized hiatal hernia with compressive atelectasis in the left lower lobe. Hepatobiliary: Liver shows scattered hypodensities measuring up to 2.5 cm consistent with simple cysts. The gallbladder is within normal limits. Pancreas: Unremarkable. No pancreatic ductal dilatation  or surrounding inflammatory changes. Spleen:  Normal in size without focal abnormality. Adrenals/Urinary Tract: Adrenal glands are within normal limits. Kidneys are well visualized bilaterally without renal calculi. Prominent extrarenal pelvis is noted on the left although no obstructive changes are seen. The bladder is decompressed by Foley catheter. Stomach/Bowel: Mild fecal material is noted throughout the colon consistent with a degree of constipation. The appendix is not well visualized although no inflammatory changes to suggest appendicitis are seen. Stomach and small bowel are within normal limits aside from the previously mentioned hiatal hernia. Vascular/Lymphatic: Aortic atherosclerosis. No enlarged abdominal or pelvic lymph nodes. Reproductive: Prostate is unremarkable. Other: No abdominal wall hernia or abnormality. No abdominopelvic ascites. Musculoskeletal: Degenerative changes of lumbar spine are noted. Chronic likely posttraumatic changes are noted in the right ilium. A pain pump is noted in the anterior abdomen. IMPRESSION: Moderate-sized hiatal hernia. Changes of mild colonic constipation. No acute abnormality to correspond with the given clinical history is noted. Electronically Signed   By: Alcide Clever M.D.   On: 06/21/2021 20:24   DG Chest 1 View  Result Date: 06/21/2021 CLINICAL DATA:  Weakness EXAM: CHEST  1 VIEW COMPARISON:  01/01/2017, CT 04/23/2017 FINDINGS: Moderate hiatal hernia. Streaky atelectasis or scar at the right base. Streaky probable atelectasis left base. Probable air-filled loop of bowel in the right upper quadrant. IMPRESSION: 1. Streaky basilar opacities, favor atelectasis 2. Moderate hiatal hernia Electronically Signed   By: Jasmine Pang M.D.   On: 06/21/2021 19:18               LOS: 1 day   Christianna Belmonte  [facility]   Pager on [email or web address]. If 7PM-7AM, please contact night-coverage at [email or web address]     06/22/2021, 11:36 AM

## 2021-06-22 NOTE — Progress Notes (Signed)
Cross Cover BP stabilized with fluids Foley order to be changed and completed by nursing Level of care and bed request for medsurg

## 2021-06-22 NOTE — Progress Notes (Signed)
[facility] PCCM  BP improved, stable  PCCM will sign off  Heber [place], MD  PCCM Pager: [phone] Cell: [phone] After 7:00 pm call Elink  [phone]

## 2021-06-22 NOTE — ED Notes (Signed)
Pt transferred to bedside commode with 2 assist, pt able to bear some weight on feet, pt assisted back to bed. Pt alert, oriented, NAD at this time.

## 2021-06-22 NOTE — Progress Notes (Signed)
PHARMACY - PHYSICIAN COMMUNICATION CRITICAL VALUE ALERT - BLOOD CULTURE IDENTIFICATION (BCID)  Dustin Turner is an 86 y.o. male who presented to [facility] on 06/21/2021 with a chief complaint of weakness  Assessment:  blood culture with GNR, BCID detects E coli without resistance.  Patient has chronic foley for neurogenic bladder.   Name of physician (or Provider) Contacted: Dr Myriam Forehand  Current antibiotics: Cefepime  Changes to prescribed antibiotics recommended:  Recommendations accepted by provider  Results for orders placed or performed during the hospital encounter of 06/21/21  Blood Culture ID Panel (Reflexed) (Collected: 06/21/2021  6:45 PM)  Result Value Ref Range   Enterococcus faecalis NOT DETECTED NOT DETECTED   Enterococcus Faecium NOT DETECTED NOT DETECTED   Listeria monocytogenes NOT DETECTED NOT DETECTED   Staphylococcus species NOT DETECTED NOT DETECTED   Staphylococcus aureus (BCID) NOT DETECTED NOT DETECTED   Staphylococcus epidermidis NOT DETECTED NOT DETECTED   Staphylococcus lugdunensis NOT DETECTED NOT DETECTED   Streptococcus species NOT DETECTED NOT DETECTED   Streptococcus agalactiae NOT DETECTED NOT DETECTED   Streptococcus pneumoniae NOT DETECTED NOT DETECTED   Streptococcus pyogenes NOT DETECTED NOT DETECTED   A.calcoaceticus-baumannii NOT DETECTED NOT DETECTED   Bacteroides fragilis NOT DETECTED NOT DETECTED   Enterobacterales DETECTED (A) NOT DETECTED   Enterobacter cloacae complex NOT DETECTED NOT DETECTED   Escherichia coli DETECTED (A) NOT DETECTED   Klebsiella aerogenes NOT DETECTED NOT DETECTED   Klebsiella oxytoca NOT DETECTED NOT DETECTED   Klebsiella pneumoniae NOT DETECTED NOT DETECTED   Proteus species NOT DETECTED NOT DETECTED   Salmonella species NOT DETECTED NOT DETECTED   Serratia marcescens NOT DETECTED NOT DETECTED   Haemophilus influenzae NOT DETECTED NOT DETECTED   Neisseria meningitidis NOT DETECTED NOT DETECTED   Pseudomonas  aeruginosa NOT DETECTED NOT DETECTED   Stenotrophomonas maltophilia NOT DETECTED NOT DETECTED   Candida albicans NOT DETECTED NOT DETECTED   Candida auris NOT DETECTED NOT DETECTED   Candida glabrata NOT DETECTED NOT DETECTED   Candida krusei NOT DETECTED NOT DETECTED   Candida parapsilosis NOT DETECTED NOT DETECTED   Candida tropicalis NOT DETECTED NOT DETECTED   Cryptococcus neoformans/gattii NOT DETECTED NOT DETECTED   CTX-M ESBL NOT DETECTED NOT DETECTED   Carbapenem resistance IMP NOT DETECTED NOT DETECTED   Carbapenem resistance KPC NOT DETECTED NOT DETECTED   Carbapenem resistance NDM NOT DETECTED NOT DETECTED   Carbapenem resist OXA 48 LIKE NOT DETECTED NOT DETECTED   Carbapenem resistance VIM NOT DETECTED NOT DETECTED    Juliette Alcide, PharmD, BCPS, BCIDP Work Cell: [phone] 06/22/2021 9:30 AM

## 2021-06-23 DIAGNOSIS — R7881 Bacteremia: Secondary | ICD-10-CM

## 2021-06-23 DIAGNOSIS — A419 Sepsis, unspecified organism: Secondary | ICD-10-CM | POA: Diagnosis not present

## 2021-06-23 DIAGNOSIS — B9620 Unspecified Escherichia coli [E. coli] as the cause of diseases classified elsewhere: Secondary | ICD-10-CM

## 2021-06-23 DIAGNOSIS — T83511A Infection and inflammatory reaction due to indwelling urethral catheter, initial encounter: Secondary | ICD-10-CM | POA: Diagnosis not present

## 2021-06-23 DIAGNOSIS — N179 Acute kidney failure, unspecified: Secondary | ICD-10-CM | POA: Diagnosis not present

## 2021-06-23 LAB — BASIC METABOLIC PANEL
Anion gap: 5 (ref 5–15)
BUN: 26 mg/dL — ABNORMAL HIGH (ref 8–23)
CO2: 28 mmol/L (ref 22–32)
Calcium: 7.8 mg/dL — ABNORMAL LOW (ref 8.9–10.3)
Chloride: 102 mmol/L (ref 98–111)
Creatinine, Ser: 1.11 mg/dL (ref 0.61–1.24)
GFR, Estimated: >60 mL/min (ref >60)
Glucose, Bld: 101 mg/dL — ABNORMAL HIGH (ref 70–99)
Potassium: 3.1 mmol/L — ABNORMAL LOW (ref 3.5–5.1)
Sodium: 135 mmol/L (ref 135–145)

## 2021-06-23 LAB — CBC
HCT: 29.6 % — ABNORMAL LOW (ref 39.0–52.0)
Hemoglobin: 10.2 g/dL — ABNORMAL LOW (ref 13.0–17.0)
MCH: 31.8 pg (ref 26.0–34.0)
MCHC: 34.5 g/dL (ref 30.0–36.0)
MCV: 92.2 fL (ref 80.0–100.0)
Platelets: 89 10*3/uL — ABNORMAL LOW (ref 150–400)
RBC: 3.21 MIL/uL — ABNORMAL LOW (ref 4.22–5.81)
RDW: 13.8 % (ref 11.5–15.5)
WBC: 8 10*3/uL (ref 4.0–10.5)
nRBC: 0 % (ref 0.0–0.2)

## 2021-06-23 LAB — GLUCOSE, CAPILLARY: Glucose-Capillary: 111 mg/dL — ABNORMAL HIGH (ref 70–99)

## 2021-06-23 LAB — MAGNESIUM: Magnesium: 2.1 mg/dL (ref 1.7–2.4)

## 2021-06-23 MED ORDER — POTASSIUM CHLORIDE CRYS ER 20 MEQ PO TBCR
40.0000 meq | EXTENDED_RELEASE_TABLET | Freq: Once | ORAL | Status: AC
  Administered 2021-06-23: 40 meq via ORAL
  Filled 2021-06-23: qty 2

## 2021-06-23 NOTE — Evaluation (Signed)
Physical Therapy Evaluation Patient Details Name: Dustin Turner MRN: [id number] DOB: 1935-06-09 Today's Date: 06/23/2021  History of Present Illness  86 y.o. male presents to hospital with generalized weakness and fatigue for 3 days. He was found to have severe sepsis secondary to acute complicated UTI complicated by hypotension and AKI. PMH significant for chronic anemia, BPH, neurogenic bladder with chronic indwelling Foley catheter, history of UTI since Foley catheter was placed, CAD, hypogonadism, seizure disorder, spinal stenosis, scoliosis.   Clinical Impression  Pt received in bed, CNA assessing vitals, eager and agreeable to therapy. Pt lives at ALF and uses power w/c at baseline. He is currently receiving PT services. Pt performed bed mobility with increased time however Mod I. STS and short distance ambulation performed with CGA for safety due to significant trunk flexion that progressively worsens with fatigue. PT pacing pt through session as his goal was to perform all of this typically exercises that he was performing prior to the infection - PT limited for safety. He presents with generalized weakness and lack of endurance. Would benefit from skilled PT to address above deficits and promote optimal return to PLOF.      Recommendations for follow up therapy are one component of a multi-disciplinary discharge planning process, led by the attending physician.  Recommendations may be updated based on patient status, additional functional criteria and insurance authorization.  Follow Up Recommendations Home health PT (continue PT at ALF)    Assistance Recommended at Discharge Intermittent Supervision/Assistance  Patient can return home with the following  A little help with walking and/or transfers;A little help with bathing/dressing/bathroom;Assistance with cooking/housework;Assist for transportation    Equipment Recommendations None recommended by PT  Recommendations for Other  Services       Functional Status Assessment Patient has had a recent decline in their functional status and demonstrates the ability to make significant improvements in function in a reasonable and predictable amount of time.     Precautions / Restrictions Precautions Precautions: Fall Restrictions Weight Bearing Restrictions: No      Mobility  Bed Mobility Overal bed mobility: Modified Independent             General bed mobility comments: used bed rails, no assist required    Transfers Overall transfer level: Needs assistance Equipment used: Rolling walker (2 wheels) Transfers: Sit to/from Stand Sit to Stand: Min guard           General transfer comment: CGA for safety due to significant trunk flexion; cued to use RW    Ambulation/Gait Ambulation/Gait assistance: Min guard Gait Distance (Feet): 15 Feet Assistive device: Rolling walker (2 wheels) Gait Pattern/deviations: Step-through pattern, Trunk flexed, Decreased stride length, Shuffle Gait velocity: decreased     General Gait Details: 11ft x2 reps with standing rest break between. Ambulates with severly flexed trunk. CGA at all times for safety. Mild tremors with fatigue.  Stairs            Wheelchair Mobility    Modified Rankin (Stroke Patients Only)       Balance Overall balance assessment: Needs assistance Sitting-balance support: Feet supported Sitting balance-Leahy Scale: Good     Standing balance support: Bilateral upper extremity supported, During functional activity Standing balance-Leahy Scale: Fair Standing balance comment: Is able to stand without AD however RW encouraged to prevent further trunk flexion  Pertinent Vitals/Pain Pain Assessment Pain Assessment: Faces Faces Pain Scale: Hurts little more Pain Location: bilateral feet - neuropathy    Home Living Family/patient expects to be discharged to:: Assisted living                         Prior Function Prior Level of Function : Needs assist       Physical Assist : Mobility (physical);ADLs (physical) Mobility (physical): Transfers;Gait ADLs (physical): Bathing;Dressing;Toileting;IADLs Mobility Comments: uses PWC/scooter at baseline for mobility; is able to ambulate limited distances; states he is active going to meals, meetings and is currently in PT. Denies falls. He recieves assistance from staff for transfers.       Hand Dominance        Extremity/Trunk Assessment   Upper Extremity Assessment Upper Extremity Assessment: Generalized weakness    Lower Extremity Assessment Lower Extremity Assessment: Generalized weakness    Cervical / Trunk Assessment Cervical / Trunk Assessment: Kyphotic  Communication   Communication: No difficulties  Cognition Arousal/Alertness: Awake/alert Behavior During Therapy: WFL for tasks assessed/performed Overall Cognitive Status: Within Functional Limits for tasks assessed                                 General Comments: A&Ox4; did mix up words occasionally (i.e. stated he would like HOB fully elevated when he actually wanted it fully flat)        General Comments      Exercises Other Exercises Other Exercises: Pt demonstrated exercises he is currently performing with PT at ALF - exercises include scap retraction, cervical extension and standing at counter with press up for upright trunk. x10 reps of each.   Assessment/Plan    PT Assessment Patient needs continued PT services  PT Problem List Decreased strength;Decreased range of motion;Decreased activity tolerance;Decreased balance;Decreased mobility       PT Treatment Interventions Balance training;Gait training;Neuromuscular re-education;Functional mobility training;Therapeutic activities;Therapeutic exercise;Patient/family education    PT Goals (Current goals can be found in the Care Plan section)  Acute Rehab PT Goals Patient  Stated Goal: to get stronger PT Goal Formulation: With patient Time For Goal Achievement: 07/07/21 Potential to Achieve Goals: Good    Frequency Min 2X/week     Co-evaluation               AM-PAC PT "6 Clicks" Mobility  Outcome Measure Help needed turning from your back to your side while in a flat bed without using bedrails?: None Help needed moving from lying on your back to sitting on the side of a flat bed without using bedrails?: A Little Help needed moving to and from a bed to a chair (including a wheelchair)?: A Little Help needed standing up from a chair using your arms (e.g., wheelchair or bedside chair)?: A Little Help needed to walk in hospital room?: A Little Help needed climbing 3-5 steps with a railing? : Total 6 Click Score: 17    End of Session Equipment Utilized During Treatment: Gait belt Activity Tolerance: Patient tolerated treatment well Patient left: in bed;with call bell/phone within reach;with bed alarm set Nurse Communication: Mobility status PT Visit Diagnosis: Unsteadiness on feet (R26.81);Other abnormalities of gait and mobility (R26.89);Muscle weakness (generalized) (M62.81)    Time: 7322-0254 PT Time Calculation (min) (ACUTE ONLY): 20 min   Charges:   PT Evaluation $PT Eval Moderate Complexity: 1 Mod PT Treatments $Therapeutic Exercise: 8-22 mins  Patrina Levering PT, DPT

## 2021-06-23 NOTE — Progress Notes (Signed)
Progress Note    Dustin Turner  [id number] DOB: [birth year]  DOA: 06/21/2021 PCP: System, Provider Not In      Brief Narrative:    Medical records reviewed and are as summarized below:  Dustin Turner is a 86 y.o. male with medical history significant for chronic anemia, BPH, neurogenic bladder with chronic indwelling Foley catheter, history of UTI since Foley catheter was placed, CAD, hypogonadism, seizure disorder, spinal stenosis, scoliosis, who presented to the hospital with generalized weakness, fatigue and not feeling well for about 3 days duration.  He said he has had indwelling Foley catheter for about a year and this was changed about a month prior to admission.  He was febrile with temperature of 100.5 F in the ED.  He was found to have severe sepsis secondary to acute complicated UTI complicated by hypotension and AKI. He was treated with IV fluids and empiric IV antibiotics.  Blood culture showed E. coli.  consistent with E. coli bacteremia.     Assessment/Plan:   Principal Problem:   Severe sepsis (HCC) Active Problems:   E coli bacteremia   AKI (acute kidney injury) ([place])   Anemia   Seizures (HCC)   Bladder neurogenesis   BPH with obstruction/lower urinary tract symptoms   OSA on CPAP   Arthritis   UTI (urinary tract infection) due to urinary indwelling Foley catheter (HCC)    Body mass index is 29.35 kg/m.   Severe E. coli sepsis secondary to cath associated E. coli UTI and bacteremia: Continue IV ceftriaxone.  Follow-up blood and urine culture sensitivity report.  AKI: Improved  Hypokalemia: Replete potassium.  Hypotension: Improved  BPH, neurogenic bladder: Foley catheter was changed on 06/21/2021.  Continue finasteride.  Generalized weakness: Consult PT  Other comorbidities include chronic anemia, seizure disorder, OSA on CPAP, arthritis   Diet Order             Diet regular Room service appropriate? Yes; Fluid consistency: Thin  Diet  effective now                            Consultants: Intensivist  Procedures: None    Medications:    Chlorhexidine Gluconate Cloth  6 each Topical Daily   DULoxetine  60 mg Oral QHS   finasteride  5 mg Oral Daily   heparin  5,000 Units Subcutaneous Q8H   pregabalin  200 mg Oral BID   sodium chloride flush  3 mL Intravenous Q12H   Continuous Infusions:  cefTRIAXone (ROCEPHIN)  IV 2 g (06/23/21 0939)     Anti-infectives (From admission, onward)    Start     Dose/Rate Route Frequency Ordered Stop   06/22/21 2000  ceFEPIme (MAXIPIME) 2 g in sodium chloride 0.9 % 100 mL IVPB  Status:  Discontinued        2 g 200 mL/hr over 30 Minutes Intravenous Every 24 hours 06/21/21 2130 06/22/21 0740   06/22/21 1100  cefTRIAXone (ROCEPHIN) 2 g in sodium chloride 0.9 % 100 mL IVPB        2 g 200 mL/hr over 30 Minutes Intravenous Daily 06/22/21 1026     06/22/21 1000  ceFEPIme (MAXIPIME) 2 g in sodium chloride 0.9 % 100 mL IVPB  Status:  Discontinued        2 g 200 mL/hr over 30 Minutes Intravenous Every 12 hours 06/22/21 0740 06/22/21 1026   06/21/21 1915  ceFEPIme (MAXIPIME) 2  g in sodium chloride 0.9 % 100 mL IVPB        2 g 200 mL/hr over 30 Minutes Intravenous  Once 06/21/21 1904 06/21/21 2023              Family Communication/Anticipated D/C date and plan/Code Status   DVT prophylaxis: heparin injection 5,000 Units Start: 06/21/21 2215     Code Status: Full Code  Family Communication: None Disposition Plan: Plan to discharge to assisted living facility in 1 to 2 days   Status is: Inpatient Remains inpatient appropriate because: On IV antibiotics       Subjective:   He complains of generalized weakness.  No abdominal pain.   Objective:    Vitals:   06/23/21 0345 06/23/21 0522 06/23/21 0748 06/23/21 1209  BP: 123/81 115/78 118/73 112/68  Pulse: 82 74 75 77  Resp: 18 17 16 18   Temp: 98.7 F (37.1 C) 97.9 F (36.6 C) 97.6 F (36.4  C) 98.3 F (36.8 C)  TempSrc:      SpO2: 95% 96% 95% 100%  Weight:      Height:       No data found.   Intake/Output Summary (Last 24 hours) at 06/23/2021 1330 Last data filed at 06/23/2021 0523 Gross per 24 hour  Intake 916.35 ml  Output 1401 ml  Net -484.65 ml   Filed Weights   06/22/21 0700  Weight: 80 kg    Exam:  GEN: NAD SKIN: Warm and dry EYES: No pallor or icterus ENT: MMM CV: RRR PULM: CTA B ABD: soft, ND, NT, +BS, + pain pump in the abdomen CNS: AAO x 3, non focal EXT: No edema or tenderness GU: Foley catheter draining amber urine    Data Reviewed:   I have personally reviewed following labs and imaging studies:  Labs: Labs show the following:   Basic Metabolic Panel: Recent Labs  Lab 06/21/21 1845 06/22/21 0446 06/23/21 0601  NA 129* 133* 135  K 4.2 3.6 3.1*  CL 95* 101 102  CO2 27 28 28   GLUCOSE 224* 143* 101*  BUN 41* 35* 26*  CREATININE 2.27* 1.66* 1.11  CALCIUM 8.5* 7.8* 7.8*  MG  --  1.9 2.1   GFR Estimated Creatinine Clearance: 48.3 mL/min (by C-G formula based on SCr of 1.11 mg/dL). Liver Function Tests: Recent Labs  Lab 06/21/21 1845  AST 35  ALT 17  ALKPHOS 65  BILITOT 1.3*  PROT 6.2*  ALBUMIN 3.3*   No results for input(s): "LIPASE", "AMYLASE" in the last 168 hours. No results for input(s): "AMMONIA" in the last 168 hours. Coagulation profile Recent Labs  Lab 06/21/21 1845 06/22/21 0446  INR 1.4* 1.4*    CBC: Recent Labs  Lab 06/21/21 1845 06/23/21 0601  WBC 16.1* 8.0  NEUTROABS 14.4*  --   HGB 11.1* 10.2*  HCT 34.1* 29.6*  MCV 93.9 92.2  PLT 100* 89*   Cardiac Enzymes: No results for input(s): "CKTOTAL", "CKMB", "CKMBINDEX", "TROPONINI" in the last 168 hours. BNP (last 3 results) No results for input(s): "PROBNP" in the last 8760 hours. CBG: Recent Labs  Lab 06/23/21 0333  GLUCAP 111*   D-Dimer: No results for input(s): "DDIMER" in the last 72 hours. Hgb A1c: No results for input(s):  "HGBA1C" in the last 72 hours. Lipid Profile: No results for input(s): "CHOL", "HDL", "LDLCALC", "TRIG", "CHOLHDL", "LDLDIRECT" in the last 72 hours. Thyroid function studies: No results for input(s): "TSH", "T4TOTAL", "T3FREE", "THYROIDAB" in the last 72 hours.  Invalid  input(s): "FREET3" Anemia work up: No results for input(s): "VITAMINB12", "FOLATE", "FERRITIN", "TIBC", "IRON", "RETICCTPCT" in the last 72 hours. Sepsis Labs: Recent Labs  Lab 06/21/21 1845 06/22/21 0113 06/23/21 0601  PROCALCITON 11.47  --   --   WBC 16.1*  --  8.0  LATICACIDVEN 2.8* 1.4  --     Microbiology Recent Results (from the past 240 hour(s))  Culture, blood (Routine x 2)     Status: Abnormal (Preliminary result)   Collection Time: 06/21/21  6:45 PM   Specimen: BLOOD  Result Value Ref Range Status   Specimen Description   Final    BLOOD LEFT ANTECUBITAL Performed at [facility], [address] [place], [place] [address]    Special Requests   Final    BOTTLES DRAWN AEROBIC AND ANAEROBIC Blood Culture adequate volume Performed at [facility], [address] [place], [place] [address]    Culture  Setup Time   Final    Organism ID to follow [place] AEROBIC AND ANAEROBIC BOTTLES CRITICAL RESULT CALLED TO, READ BACK BY AND VERIFIED WITH: DEVAN MITCHELL ON 08/20/21 @0900  RP Performed at [facility], [address] [place], [place] [address]    Culture (A)  Final    ESCHERICHIA COLI SUSCEPTIBILITIES TO FOLLOW Performed at [facility], [place] [address] [place], [place] [address]    Report Status PENDING  Incomplete  Blood Culture ID Panel (Reflexed)     Status: Abnormal   Collection Time: 06/21/21  6:45 PM  Result Value Ref Range Status   Enterococcus faecalis NOT DETECTED NOT DETECTED Final   Enterococcus Faecium NOT DETECTED NOT DETECTED Final   Listeria monocytogenes NOT DETECTED NOT DETECTED Final   Staphylococcus species NOT DETECTED  NOT DETECTED Final   Staphylococcus aureus (BCID) NOT DETECTED NOT DETECTED Final   Staphylococcus epidermidis NOT DETECTED NOT DETECTED Final   Staphylococcus lugdunensis NOT DETECTED NOT DETECTED Final   Streptococcus species NOT DETECTED NOT DETECTED Final   Streptococcus agalactiae NOT DETECTED NOT DETECTED Final   Streptococcus pneumoniae NOT DETECTED NOT DETECTED Final   Streptococcus pyogenes NOT DETECTED NOT DETECTED Final   A.calcoaceticus-baumannii NOT DETECTED NOT DETECTED Final   Bacteroides fragilis NOT DETECTED NOT DETECTED Final   Enterobacterales DETECTED (A) NOT DETECTED Final    Comment: Enterobacterales represent a large order of gram negative bacteria, not a single organism. CRITICAL RESULT CALLED TO, READ BACK BY AND VERIFIED WITH: DEVAN MITCHELL ON 06/22/21 @0900  RP    Enterobacter cloacae complex NOT DETECTED NOT DETECTED Final   Escherichia coli DETECTED (A) NOT DETECTED Final    Comment: CRITICAL RESULT CALLED TO, READ BACK BY AND VERIFIED WITH: DEVAN MITCHELL ON 06/22/21 @0900  RP    Klebsiella aerogenes NOT DETECTED NOT DETECTED Final   Klebsiella oxytoca NOT DETECTED NOT DETECTED Final   Klebsiella pneumoniae NOT DETECTED NOT DETECTED Final   Proteus species NOT DETECTED NOT DETECTED Final   Salmonella species NOT DETECTED NOT DETECTED Final   Serratia marcescens NOT DETECTED NOT DETECTED Final   Haemophilus influenzae NOT DETECTED NOT DETECTED Final   Neisseria meningitidis NOT DETECTED NOT DETECTED Final   Pseudomonas aeruginosa NOT DETECTED NOT DETECTED Final   Stenotrophomonas maltophilia NOT DETECTED NOT DETECTED Final   Candida albicans NOT DETECTED NOT DETECTED Final   Candida auris NOT DETECTED NOT DETECTED Final   Candida glabrata NOT DETECTED NOT DETECTED Final   Candida krusei NOT DETECTED NOT DETECTED Final   Candida  parapsilosis NOT DETECTED NOT DETECTED Final   Candida tropicalis NOT DETECTED NOT DETECTED Final   Cryptococcus neoformans/gattii  NOT DETECTED NOT DETECTED Final   CTX-M ESBL NOT DETECTED NOT DETECTED Final   Carbapenem resistance IMP NOT DETECTED NOT DETECTED Final   Carbapenem resistance KPC NOT DETECTED NOT DETECTED Final   Carbapenem resistance NDM NOT DETECTED NOT DETECTED Final   Carbapenem resist OXA 48 LIKE NOT DETECTED NOT DETECTED Final   Carbapenem resistance VIM NOT DETECTED NOT DETECTED Final    Comment: Performed at [facility], [address], [place], [place] [address]  Urine Culture     Status: Abnormal (Preliminary result)   Collection Time: 06/21/21  6:46 PM   Specimen: Urine, Clean Catch  Result Value Ref Range Status   Specimen Description   Final    URINE, CLEAN CATCH Performed at [facility], [address], [place], [place] [address]    Special Requests   Final    NONE Performed at [facility], [address], [place], [place] [address]    Culture (A)  Final    >=100,000 COLONIES/mL ESCHERICHIA COLI SUSCEPTIBILITIES TO FOLLOW Performed at [facility], [place] [address], [place], [place] [address]    Report Status PENDING  Incomplete  Resp Panel by RT-PCR (Flu A&B, Covid) Anterior Nasal Swab     Status: None   Collection Time: 06/21/21  9:33 PM   Specimen: Anterior Nasal Swab  Result Value Ref Range Status   SARS Coronavirus 2 by RT PCR NEGATIVE NEGATIVE Final    Comment: (NOTE) SARS-CoV-2 target nucleic acids are NOT DETECTED.  The SARS-CoV-2 RNA is generally detectable in upper respiratory specimens during the acute phase of infection. The lowest concentration of SARS-CoV-2 viral copies this assay can detect is 138 copies/mL. A negative result does not preclude SARS-Cov-2 infection and should not be used as the sole basis for treatment or other patient management decisions. A negative result may occur with  improper specimen collection/handling, submission of specimen other than nasopharyngeal swab, presence of viral mutation(s) within  the areas targeted by this assay, and inadequate number of viral copies(<138 copies/mL). A negative result must be combined with clinical observations, patient history, and epidemiological information. The expected result is Negative.  Fact Sheet for Patients:  EntrepreneurPulse.com.au  Fact Sheet for Healthcare Providers:  IncredibleEmployment.be  This test is no t yet approved or cleared by the [place] FDA and  has been authorized for detection and/or diagnosis of SARS-CoV-2 by FDA under an Emergency Use Authorization (EUA). This EUA will remain  in effect (meaning this test can be used) for the duration of the COVID-19 declaration under Section 564(b)(1) of the Act, 21 U.S.C.section 360bbb-3(b)(1), unless the authorization is terminated  or revoked sooner.       Influenza A by PCR NEGATIVE NEGATIVE Final   Influenza B by PCR NEGATIVE NEGATIVE Final    Comment: (NOTE) The Xpert Xpress SARS-CoV-2/FLU/RSV plus assay is intended as an aid in the diagnosis of influenza from Nasopharyngeal swab specimens and should not be used as a sole basis for treatment. Nasal washings and aspirates are unacceptable for Xpert Xpress SARS-CoV-2/FLU/RSV testing.  Fact Sheet for Patients: EntrepreneurPulse.com.au  Fact Sheet for Healthcare Providers: IncredibleEmployment.be  This test is not yet approved or cleared by the [place] FDA and has been authorized for detection and/or diagnosis of SARS-CoV-2 by FDA under an Emergency Use Authorization (EUA). This EUA will remain in effect (meaning this test  can be used) for the duration of the COVID-19 declaration under Section 564(b)(1) of the Act, 21 U.S.C. section 360bbb-3(b)(1), unless the authorization is terminated or revoked.  Performed at [facility], [address] [place], [place] [address]   Culture, blood (Routine X 2) w Reflex to ID Panel      Status: None (Preliminary result)   Collection Time: 06/22/21  5:06 PM   Specimen: BLOOD  Result Value Ref Range Status   Specimen Description BLOOD RIGHT [facility]  Final   Special Requests   Final    BOTTLES DRAWN AEROBIC AND ANAEROBIC Blood Culture adequate volume   Culture   Final    NO GROWTH < 12 HOURS Performed at [facility], [address], [place], [place] [address]    Report Status PENDING  Incomplete    Procedures and diagnostic studies:  CT Renal Stone Study  Result Date: 06/21/2021 CLINICAL DATA:  Generalized weakness and flank pain for several days, initial encounter EXAM: CT ABDOMEN AND PELVIS WITHOUT CONTRAST TECHNIQUE: Multidetector CT imaging of the abdomen and pelvis was performed following the standard protocol without IV contrast. RADIATION DOSE REDUCTION: This exam was performed according to the departmental dose-optimization program which includes automated exposure control, adjustment of the mA and/or kV according to patient size and/or use of iterative reconstruction technique. COMPARISON:  CT of the chest from 04/23/2017 FINDINGS: Lower chest: Lung bases demonstrate evidence of a moderate-sized hiatal hernia with compressive atelectasis in the left lower lobe. Hepatobiliary: Liver shows scattered hypodensities measuring up to 2.5 cm consistent with simple cysts. The gallbladder is within normal limits. Pancreas: Unremarkable. No pancreatic ductal dilatation or surrounding inflammatory changes. Spleen: Normal in size without focal abnormality. Adrenals/Urinary Tract: Adrenal glands are within normal limits. Kidneys are well visualized bilaterally without renal calculi. Prominent extrarenal pelvis is noted on the left although no obstructive changes are seen. The bladder is decompressed by Foley catheter. Stomach/Bowel: Mild fecal material is noted throughout the colon consistent with a degree of constipation. The appendix is not well visualized although no  inflammatory changes to suggest appendicitis are seen. Stomach and small bowel are within normal limits aside from the previously mentioned hiatal hernia. Vascular/Lymphatic: Aortic atherosclerosis. No enlarged abdominal or pelvic lymph nodes. Reproductive: Prostate is unremarkable. Other: No abdominal wall hernia or abnormality. No abdominopelvic ascites. Musculoskeletal: Degenerative changes of lumbar spine are noted. Chronic likely posttraumatic changes are noted in the right ilium. A pain pump is noted in the anterior abdomen. IMPRESSION: Moderate-sized hiatal hernia. Changes of mild colonic constipation. No acute abnormality to correspond with the given clinical history is noted. Electronically Signed   By: Inez Catalina M.D.   On: 06/21/2021 20:24   DG Chest 1 View  Result Date: 06/21/2021 CLINICAL DATA:  Weakness EXAM: CHEST  1 VIEW COMPARISON:  01/01/2017, CT 04/23/2017 FINDINGS: Moderate hiatal hernia. Streaky atelectasis or scar at the right base. Streaky probable atelectasis left base. Probable air-filled loop of bowel in the right upper quadrant. IMPRESSION: 1. Streaky basilar opacities, favor atelectasis 2. Moderate hiatal hernia Electronically Signed   By: Donavan Foil M.D.   On: 06/21/2021 19:18               LOS: 2 days   Dustin Turner  [facility]   Pager on [email or web address]. If 7PM-7AM, please contact night-coverage at [email or web address]     06/23/2021, 1:30 PM

## 2021-06-23 NOTE — Care Management Important Message (Signed)
Important Message  Patient Details  Name: Dustin Turner MRN: [id number] Date of Birth: 07-09-1935   Medicare Important Message Given:  N/A - LOS <3 / Initial given by admissions     [name] 06/23/2021, 7:54 AM

## 2021-06-23 NOTE — Progress Notes (Signed)
Pt c/o excessive perspiration. Gown and top flat sheet noticeably damp. Blood glucose 111, VS trend baseline, afebrile. Pt denies pain. Changed gown and flat sheet, repositioned Pt, and encouraged fluids. Pt stated that he was comfortable. Current plan of care continues.

## 2021-06-24 DIAGNOSIS — A419 Sepsis, unspecified organism: Secondary | ICD-10-CM | POA: Diagnosis not present

## 2021-06-24 DIAGNOSIS — R7881 Bacteremia: Secondary | ICD-10-CM | POA: Diagnosis not present

## 2021-06-24 DIAGNOSIS — N179 Acute kidney failure, unspecified: Secondary | ICD-10-CM | POA: Diagnosis not present

## 2021-06-24 DIAGNOSIS — T83511A Infection and inflammatory reaction due to indwelling urethral catheter, initial encounter: Secondary | ICD-10-CM | POA: Diagnosis not present

## 2021-06-24 LAB — URINE CULTURE: Culture: >=100000 — AB

## 2021-06-24 LAB — POTASSIUM: Potassium: 3.4 mmol/L — ABNORMAL LOW (ref 3.5–5.1)

## 2021-06-24 LAB — MAGNESIUM: Magnesium: 2 mg/dL (ref 1.7–2.4)

## 2021-06-24 MED ORDER — AMOXICILLIN 500 MG PO TABS: 500.0000 mg | ORAL_TABLET | Freq: Three times a day (TID) | ORAL | 0 refills | Status: AC

## 2021-06-24 NOTE — TOC Transition Note (Signed)
Transition of Care [facility]) - CM/SW Discharge Note   Patient Details  Name: ADONIAS ABUD MRN: [id number] Date of Birth: 03/26/1935  Transition of Care [facility]) CM/SW Contact:  Izola Price, RN Phone Number: 06/24/2021, 11:16 AM   Clinical Narrative:  6/10: Patient will be discharged back to home/[facility]. Confirmed with ALF/Brie at [phone] going to room 44. Foley catheter was changed and facility updated on condition. Son will transport to facility later today. RX sent to correct facility pharmacy per Brie at ALF. Clarise Cruz [facility] coordinator will check on PT via [facility] or make arrangements for that per phone call. Simmie Davies RN CM      Final next level of care: Assisted Living ([place], [place]) Barriers to Discharge: Barriers Resolved   Patient Goals and CMS Choice        Discharge Placement                Patient to be transferred to facility by: Son   Patient and family notified of of transfer: 06/24/21  Discharge Plan and Services                          HH Arranged: PT (Via Nanine Means or they will arrange via [place]) [place]: [place] Date [place]: 06/24/21 Time [place]: 1115 Representative spoke with at [place]: Judson Roch from [place] Interventions     Readmission Risk Interventions     No data to display

## 2021-06-24 NOTE — Progress Notes (Signed)
Pt A/ox4 upon reivew of AVS. Patient daughter or son will come to pick patient up when ready to bring back to ALF. No further needs. PIV removed. Will bring patient to entrance when ready via wheelchair.

## 2021-06-24 NOTE — Discharge Summary (Signed)
Physician Discharge Summary   Patient: Dustin Turner MRN: [id number] DOB: 08-16-1935  Admit date:     06/21/2021  Discharge date: 06/24/21  Discharge Physician: Jennye Boroughs   PCP: System, Provider Not In   Recommendations at discharge:   Follow-up with PCP in 1 week  Discharge Diagnoses: Principal Problem:   Severe sepsis ([place]) Active Problems:   E coli bacteremia   AKI (acute kidney injury) ([place])   Anemia   Seizures (H[facility])   Bladder neurogenesis   BPH with obstruction/lower urinary tract symptoms   OSA on CPAP   Arthritis   UTI (urinary tract infection) due to urinary indwelling Foley catheter ([place])  Resolved Problems:   * No resolved hospital problems. [facility] Course:   Mr. TRIGG DELAROCHA is a 86 y.o. male with medical history significant for chronic anemia, BPH, neurogenic bladder with chronic indwelling Foley catheter, history of UTI since Foley catheter was placed, CAD, hypogonadism, seizure disorder, spinal stenosis, scoliosis, who presented to the hospital with generalized weakness, fatigue and not feeling well for about 3 days duration.  He said he has had indwelling Foley catheter for about a year and this was changed about a month prior to admission.  He was febrile with temperature of 100.5 F in the ED.   He was found to have severe sepsis secondary to acute complicated UTI complicated by hypotension and AKI. He was treated with IV fluids and empiric IV antibiotics.  Blood culture showed E. coli.  consistent with E. coli bacteremia.  Urine culture also showed E. coli.  Foley catheter was changed on 06/21/2021.  His condition has improved significantly.  PT recommended home health therapy.        Consultants: None Procedures performed: None  Disposition: Assisted living Diet recommendation:  Discharge Diet Orders (From admission, onward)     Start     Ordered   06/24/21 0000  Diet - low sodium heart healthy        06/24/21 1040           Cardiac  diet DISCHARGE MEDICATION: Allergies as of 06/24/2021       Reactions   Amitriptyline Rash   Nortriptyline Other (See Comments), Rash   GI  upset        Medication List     TAKE these medications    acetaminophen 500 MG tablet Commonly known as: TYLENOL Take 1,000 mg by mouth every 6 (six) hours as needed.   alendronate 70 MG tablet Commonly known as: FOSAMAX Take 70 mg by mouth once a week. On Thursday   alum & mag hydroxide-simeth 400-400-40 MG/5ML suspension Commonly known as: MAALOX PLUS Take 15 mLs by mouth every 4 (four) hours as needed. Give 30 ml by mouth daily with meals as needed   ammonium lactate 12 % lotion Commonly known as: LAC-HYDRIN Apply 1 application topically 2 (two) times daily as needed.   amoxicillin 500 MG tablet Commonly known as: AMOXIL Take 1 tablet (500 mg total) by mouth 3 (three) times daily for 3 days. Start taking on: June 25, 2021   Biotene OralBalance Dry Mouth Gel Use as directed 1 application in the mouth or throat daily as needed. Apply a pea size amount to mucous membrane for dry mouth   calcium-vitamin D 500-200 MG-UNIT tablet Commonly known as: OSCAL WITH D Take 1 tablet by mouth 2 (two) times daily.   capsicum 0.075 % topical cream Commonly known as: ZOSTRIX Apply 1 application. topically 3 (  three) times daily.   carbamide peroxide 6.5 % OTIC solution Commonly known as: DEBROX 5 drops 2 (two) times daily.   DERMACLOUD EX Apply 1 application. topically 3 (three) times daily. Apply to penile area   diclofenac sodium 1 % Gel Commonly known as: VOLTAREN Apply 2 g topically at bedtime. Apply to Bilateral shoulders   DULoxetine HCl 60 MG Csdr Take 1 capsule by mouth at bedtime.   finasteride 5 MG tablet Commonly known as: PROSCAR Take 5 mg by mouth daily.   gabapentin 800 MG tablet Commonly known as: NEURONTIN Take 800 mg by mouth daily as needed.   guaiFENesin 100 MG/5ML liquid Commonly known as:  ROBITUSSIN Take 10 mLs by mouth every 6 (six) hours as needed for cough or to loosen phlegm.   ipratropium 0.06 % nasal spray Commonly known as: ATROVENT Place 2 sprays into both nostrils 3 (three) times daily as needed for rhinitis.   Lysine 1000 MG Tabs Take 1 tablet by mouth every other day.   Magnesium 250 MG Tabs Take 1 tablet by mouth daily.   mupirocin ointment 2 % Commonly known as: BACTROBAN Apply 1 application. topically 2 (two) times daily.   naloxegol oxalate 12.5 MG Tabs tablet Commonly known as: MOVANTIK Take 12.5 mg by mouth every morning.   naloxone 4 MG/0.1ML Liqd nasal spray kit Commonly known as: NARCAN Place into the nose.   ondansetron 4 MG tablet Commonly known as: ZOFRAN Take 4 mg by mouth every 6 (six) hours as needed for nausea or vomiting.   oxyCODONE 5 MG immediate release tablet Commonly known as: Oxy IR/ROXICODONE Take 5 mg by mouth every 6 (six) hours as needed for severe pain.   polyethylene glycol 17 g packet Commonly known as: MIRALAX / GLYCOLAX Take 17 g by mouth daily.   pregabalin 200 MG capsule Commonly known as: LYRICA Take 200 mg by mouth 2 (two) times daily.   psyllium 95 % Pack Commonly known as: HYDROCIL/METAMUCIL Take 1 packet by mouth daily.   senna-docusate 8.6-50 MG tablet Commonly known as: Senokot-S Take 1 tablet by mouth as directed. Take 1 tablet every morning Take 1 tablet at bedtime on Monday, Wednesday and Friday.   simethicone 125 MG chewable tablet Commonly known as: MYLICON Chew 696 mg by mouth every 6 (six) hours as needed for flatulence.   Systane Overnight Therapy 0.3 % Gel ophthalmic ointment Generic drug: hypromellose Place 1 application. into both eyes at bedtime.   tamsulosin 0.4 MG Caps capsule Commonly known as: FLOMAX Take 0.4 mg by mouth daily.   triamcinolone cream 0.1 % Commonly known as: KENALOG Apply 1 application. topically 2 (two) times daily.   vitamin C 500 MG tablet Commonly  known as: ASCORBIC ACID Take 500 mg by mouth daily.        Discharge Exam: Filed Weights   06/22/21 0700  Weight: 80 kg   GEN: NAD SKIN: No rash EYES: EOMI ENT: MMM CV: RRR PULM: CTA B ABD: soft, ND, NT, +BS CNS: AAO x 3, non focal EXT: No edema or tenderness GU: Foley catheter draining amber urine   Condition at discharge: good  The results of significant diagnostics from this hospitalization (including imaging, microbiology, ancillary and laboratory) are listed below for reference.   Imaging Studies: CT Renal Stone Study  Result Date: 06/21/2021 CLINICAL DATA:  Generalized weakness and flank pain for several days, initial encounter EXAM: CT ABDOMEN AND PELVIS WITHOUT CONTRAST TECHNIQUE: Multidetector CT imaging of the abdomen and pelvis  was performed following the standard protocol without IV contrast. RADIATION DOSE REDUCTION: This exam was performed according to the departmental dose-optimization program which includes automated exposure control, adjustment of the mA and/or kV according to patient size and/or use of iterative reconstruction technique. COMPARISON:  CT of the chest from 04/23/2017 FINDINGS: Lower chest: Lung bases demonstrate evidence of a moderate-sized hiatal hernia with compressive atelectasis in the left lower lobe. Hepatobiliary: Liver shows scattered hypodensities measuring up to 2.5 cm consistent with simple cysts. The gallbladder is within normal limits. Pancreas: Unremarkable. No pancreatic ductal dilatation or surrounding inflammatory changes. Spleen: Normal in size without focal abnormality. Adrenals/Urinary Tract: Adrenal glands are within normal limits. Kidneys are well visualized bilaterally without renal calculi. Prominent extrarenal pelvis is noted on the left although no obstructive changes are seen. The bladder is decompressed by Foley catheter. Stomach/Bowel: Mild fecal material is noted throughout the colon consistent with a degree of constipation.  The appendix is not well visualized although no inflammatory changes to suggest appendicitis are seen. Stomach and small bowel are within normal limits aside from the previously mentioned hiatal hernia. Vascular/Lymphatic: Aortic atherosclerosis. No enlarged abdominal or pelvic lymph nodes. Reproductive: Prostate is unremarkable. Other: No abdominal wall hernia or abnormality. No abdominopelvic ascites. Musculoskeletal: Degenerative changes of lumbar spine are noted. Chronic likely posttraumatic changes are noted in the right ilium. A pain pump is noted in the anterior abdomen. IMPRESSION: Moderate-sized hiatal hernia. Changes of mild colonic constipation. No acute abnormality to correspond with the given clinical history is noted. Electronically Signed   By: Inez Catalina M.D.   On: 06/21/2021 20:24   DG Chest 1 View  Result Date: 06/21/2021 CLINICAL DATA:  Weakness EXAM: CHEST  1 VIEW COMPARISON:  01/01/2017, CT 04/23/2017 FINDINGS: Moderate hiatal hernia. Streaky atelectasis or scar at the right base. Streaky probable atelectasis left base. Probable air-filled loop of bowel in the right upper quadrant. IMPRESSION: 1. Streaky basilar opacities, favor atelectasis 2. Moderate hiatal hernia Electronically Signed   By: Donavan Foil M.D.   On: 06/21/2021 19:18    Microbiology: Results for orders placed or performed during the hospital encounter of 06/21/21  Culture, blood (Routine x 2)     Status: Abnormal (Preliminary result)   Collection Time: 06/21/21  6:45 PM   Specimen: BLOOD  Result Value Ref Range Status   Specimen Description   Final    BLOOD LEFT ANTECUBITAL Performed at [facility], [address], [place], [place] [address]    Special Requests   Final    BOTTLES DRAWN AEROBIC AND ANAEROBIC Blood Culture adequate volume Performed at [facility], [address] [place], [place] [address]    Culture  Setup Time   Final    Organism ID to follow GRAM NEGATIVE RODS IN  BOTH AEROBIC AND ANAEROBIC BOTTLES CRITICAL RESULT CALLED TO, READ BACK BY AND VERIFIED WITH: DEVAN MITCHELL ON 08/20/21 @0900  RP Performed at [facility], [address] [place], [place] [address]    Culture ESCHERICHIA COLI (A)  Final   Report Status PENDING  Incomplete   Organism ID, Bacteria ESCHERICHIA COLI  Final      Susceptibility   Escherichia coli - MIC*    AMPICILLIN <=2 SENSITIVE Sensitive     CEFAZOLIN <=4 SENSITIVE Sensitive     CEFEPIME <=0.12 SENSITIVE Sensitive     CEFTAZIDIME <=1 SENSITIVE Sensitive     CEFTRIAXONE <=0.25 SENSITIVE Sensitive     CIPROFLOXACIN 0.5 INTERMEDIATE Intermediate  GENTAMICIN <=1 SENSITIVE Sensitive     IMIPENEM <=0.25 SENSITIVE Sensitive     TRIMETH/SULFA <=20 SENSITIVE Sensitive     AMPICILLIN/SULBACTAM <=2 SENSITIVE Sensitive     PIP/TAZO <=4 SENSITIVE Sensitive     * ESCHERICHIA COLI  Blood Culture ID Panel (Reflexed)     Status: Abnormal   Collection Time: 06/21/21  6:45 PM  Result Value Ref Range Status   Enterococcus faecalis NOT DETECTED NOT DETECTED Final   Enterococcus Faecium NOT DETECTED NOT DETECTED Final   Listeria monocytogenes NOT DETECTED NOT DETECTED Final   Staphylococcus species NOT DETECTED NOT DETECTED Final   Staphylococcus aureus (BCID) NOT DETECTED NOT DETECTED Final   Staphylococcus epidermidis NOT DETECTED NOT DETECTED Final   Staphylococcus lugdunensis NOT DETECTED NOT DETECTED Final   Streptococcus species NOT DETECTED NOT DETECTED Final   Streptococcus agalactiae NOT DETECTED NOT DETECTED Final   Streptococcus pneumoniae NOT DETECTED NOT DETECTED Final   Streptococcus pyogenes NOT DETECTED NOT DETECTED Final   A.calcoaceticus-baumannii NOT DETECTED NOT DETECTED Final   Bacteroides fragilis NOT DETECTED NOT DETECTED Final   Enterobacterales DETECTED (A) NOT DETECTED Final    Comment: Enterobacterales represent a large order of gram negative bacteria, not a single organism. CRITICAL RESULT  CALLED TO, READ BACK BY AND VERIFIED WITH: DEVAN MITCHELL ON 06/22/21 @0900  RP    Enterobacter cloacae complex NOT DETECTED NOT DETECTED Final   Escherichia coli DETECTED (A) NOT DETECTED Final    Comment: CRITICAL RESULT CALLED TO, READ BACK BY AND VERIFIED WITH: DEVAN MITCHELL ON 06/22/21 @0900  RP    Klebsiella aerogenes NOT DETECTED NOT DETECTED Final   Klebsiella oxytoca NOT DETECTED NOT DETECTED Final   Klebsiella pneumoniae NOT DETECTED NOT DETECTED Final   Proteus species NOT DETECTED NOT DETECTED Final   Salmonella species NOT DETECTED NOT DETECTED Final   Serratia marcescens NOT DETECTED NOT DETECTED Final   Haemophilus influenzae NOT DETECTED NOT DETECTED Final   Neisseria meningitidis NOT DETECTED NOT DETECTED Final   Pseudomonas aeruginosa NOT DETECTED NOT DETECTED Final   Stenotrophomonas maltophilia NOT DETECTED NOT DETECTED Final   Candida albicans NOT DETECTED NOT DETECTED Final   Candida auris NOT DETECTED NOT DETECTED Final   Candida glabrata NOT DETECTED NOT DETECTED Final   Candida krusei NOT DETECTED NOT DETECTED Final   Candida parapsilosis NOT DETECTED NOT DETECTED Final   Candida tropicalis NOT DETECTED NOT DETECTED Final   Cryptococcus neoformans/gattii NOT DETECTED NOT DETECTED Final   CTX-M ESBL NOT DETECTED NOT DETECTED Final   Carbapenem resistance IMP NOT DETECTED NOT DETECTED Final   Carbapenem resistance KPC NOT DETECTED NOT DETECTED Final   Carbapenem resistance NDM NOT DETECTED NOT DETECTED Final   Carbapenem resist OXA 48 LIKE NOT DETECTED NOT DETECTED Final   Carbapenem resistance VIM NOT DETECTED NOT DETECTED Final    Comment: Performed at [facility], [address] [place], [place] [address]  Urine Culture     Status: Abnormal   Collection Time: 06/21/21  6:46 PM   Specimen: Urine, Clean Catch  Result Value Ref Range Status   Specimen Description   Final    URINE, CLEAN CATCH Performed at [facility], [address] [place], [place] [address]    Special Requests   Final    NONE Performed at [facility], [address] [place], [place] [address]    Culture >=100,000 COLONIES/mL ESCHERICHIA COLI (A)  Final   Report Status 06/24/2021 FINAL  Final   Organism  ID, Bacteria ESCHERICHIA COLI (A)  Final      Susceptibility   Escherichia coli - MIC*    AMPICILLIN <=2 SENSITIVE Sensitive     CEFAZOLIN <=4 SENSITIVE Sensitive     CEFEPIME <=0.12 SENSITIVE Sensitive     CEFTRIAXONE <=0.25 SENSITIVE Sensitive     CIPROFLOXACIN <=0.25 SENSITIVE Sensitive     GENTAMICIN <=1 SENSITIVE Sensitive     IMIPENEM <=0.25 SENSITIVE Sensitive     NITROFURANTOIN <=16 SENSITIVE Sensitive     TRIMETH/SULFA <=20 SENSITIVE Sensitive     AMPICILLIN/SULBACTAM <=2 SENSITIVE Sensitive     PIP/TAZO <=4 SENSITIVE Sensitive     * >=100,000 COLONIES/mL ESCHERICHIA COLI  Resp Panel by RT-PCR (Flu A&B, Covid) Anterior Nasal Swab     Status: None   Collection Time: 06/21/21  9:33 PM   Specimen: Anterior Nasal Swab  Result Value Ref Range Status   SARS Coronavirus 2 by RT PCR NEGATIVE NEGATIVE Final    Comment: (NOTE) SARS-CoV-2 target nucleic acids are NOT DETECTED.  The SARS-CoV-2 RNA is generally detectable in upper respiratory specimens during the acute phase of infection. The lowest concentration of SARS-CoV-2 viral copies this assay can detect is 138 copies/mL. A negative result does not preclude SARS-Cov-2 infection and should not be used as the sole basis for treatment or other patient management decisions. A negative result may occur with  improper specimen collection/handling, submission of specimen other than nasopharyngeal swab, presence of viral mutation(s) within the areas targeted by this assay, and inadequate number of viral copies(<138 copies/mL). A negative result must be combined with clinical observations, patient history, and epidemiological information. The expected result is Negative.  Fact  Sheet for Patients:  EntrepreneurPulse.com.au  Fact Sheet for Healthcare Providers:  IncredibleEmployment.be  This test is no t yet approved or cleared by the [place] FDA and  has been authorized for detection and/or diagnosis of SARS-CoV-2 by FDA under an Emergency Use Authorization (EUA). This EUA will remain  in effect (meaning this test can be used) for the duration of the COVID-19 declaration under Section 564(b)(1) of the Act, 21 U.S.C.section 360bbb-3(b)(1), unless the authorization is terminated  or revoked sooner.       Influenza A by PCR NEGATIVE NEGATIVE Final   Influenza B by PCR NEGATIVE NEGATIVE Final    Comment: (NOTE) The Xpert Xpress SARS-CoV-2/FLU/RSV plus assay is intended as an aid in the diagnosis of influenza from Nasopharyngeal swab specimens and should not be used as a sole basis for treatment. Nasal washings and aspirates are unacceptable for Xpert Xpress SARS-CoV-2/FLU/RSV testing.  Fact Sheet for Patients: EntrepreneurPulse.com.au  Fact Sheet for Healthcare Providers: IncredibleEmployment.be  This test is not yet approved or cleared by the [place] FDA and has been authorized for detection and/or diagnosis of SARS-CoV-2 by FDA under an Emergency Use Authorization (EUA). This EUA will remain in effect (meaning this test can be used) for the duration of the COVID-19 declaration under Section 564(b)(1) of the Act, 21 U.S.C. section 360bbb-3(b)(1), unless the authorization is terminated or revoked.  Performed at [facility], [address] [place], [place] [address]   Culture, blood (Routine X 2) w Reflex to ID Panel     Status: None (Preliminary result)   Collection Time: 06/22/21  5:06 PM   Specimen: BLOOD  Result Value Ref Range Status   Specimen Description BLOOD RIGHT [facility]  Final   Special Requests   Final    BOTTLES DRAWN AEROBIC AND ANAEROBIC Blood  Culture  adequate volume   Culture   Final    NO GROWTH < 12 HOURS Performed at [facility], [place], [place] [address]    Report Status PENDING  Incomplete    Labs: CBC: Recent Labs  Lab 06/21/21 1845 06/23/21 0601  WBC 16.1* 8.0  NEUTROABS 14.4*  --   HGB 11.1* 10.2*  HCT 34.1* 29.6*  MCV 93.9 92.2  PLT 100* 89*   Basic Metabolic Panel: Recent Labs  Lab 06/21/21 1845 06/22/21 0446 06/23/21 0601 06/24/21 0631  NA 129* 133* 135  --   K 4.2 3.6 3.1* 3.4*  CL 95* 101 102  --   CO2 27 28 28   --   GLUCOSE 224* 143* 101*  --   BUN 41* 35* 26*  --   CREATININE 2.27* 1.66* 1.11  --   CALCIUM 8.5* 7.8* 7.8*  --   MG  --  1.9 2.1 2.0   Liver Function Tests: Recent Labs  Lab 06/21/21 1845  AST 35  ALT 17  ALKPHOS 65  BILITOT 1.3*  PROT 6.2*  ALBUMIN 3.3*   CBG: Recent Labs  Lab 06/23/21 0333  GLUCAP 111*    Discharge time spent: greater than 30 minutes.  Signed: Jennye Boroughs, MD [facility] 06/24/2021

## 2021-06-27 LAB — CULTURE, BLOOD (ROUTINE X 2)
Culture: NO GROWTH
Special Requests: ADEQUATE
Special Requests: ADEQUATE

## 2021-10-23 ENCOUNTER — Ambulatory Visit (INDEPENDENT_AMBULATORY_CARE_PROVIDER_SITE_OTHER): Payer: Medicare HMO | Admitting: Internal Medicine

## 2021-10-23 VITALS — BP 125/72 | HR 64 | Resp 16 | Ht 66.0 in | Wt 173.0 lb

## 2021-10-23 DIAGNOSIS — G4731 Primary central sleep apnea: Secondary | ICD-10-CM | POA: Diagnosis not present

## 2021-10-23 DIAGNOSIS — Z7189 Other specified counseling: Secondary | ICD-10-CM

## 2021-10-23 NOTE — Progress Notes (Signed)
[facility] [address] [place], [place] [address]  Pulmonary Sleep Medicine   Office Visit Note  Patient Name: Dustin Turner DOB: July 22, 1935 MRN [id number]    Chief Complaint: Obstructive Sleep Apnea visit  Brief History:  Dustin Turner is seen today for an annual follow up on APAP at 6-13 cmh20.  The patient has a 10 year history of sleep apnea. Patient is using PAP nightly.  The patient feels rested after sleeping with PAP.  The patient reports benefiting from PAP use. Reported sleepiness is improved and the Epworth Sleepiness Score is 4 out of 24. The patient does not take naps. The patient complains of the following: Issues tolerating the PAP pressure after the change to APAP.  The compliance download shows 98% compliance with an average use time of 7 hours 45 minutes. The AHI is 16.2.  The patient does not complain of limb movements disrupting sleep.  ROS  General: (-) fever, (-) chills, (-) night sweat Nose and Sinuses: (-) nasal stuffiness or itchiness, (-) postnasal drip, (-) nosebleeds, (-) sinus trouble. Mouth and Throat: (-) sore throat, (-) hoarseness. Neck: (-) swollen glands, (-) enlarged thyroid, (-) neck pain. Respiratory: - cough, - shortness of breath, - wheezing. Neurologic: - numbness, - tingling. Psychiatric: - anxiety, - depression   Current Medication: Outpatient Encounter Medications as of 10/23/2021  Medication Sig   acetaminophen (TYLENOL) 500 MG tablet Take 1,000 mg by mouth every 6 (six) hours as needed.   alendronate (FOSAMAX) 70 MG tablet Take 70 mg by mouth once a week. On Thursday   alum & mag hydroxide-simeth (MAALOX PLUS) 400-400-40 MG/5ML suspension Take 15 mLs by mouth every 4 (four) hours as needed. Give 30 ml by mouth daily with meals as needed   ammonium lactate (LAC-HYDRIN) 12 % lotion Apply 1 application topically 2 (two) times daily as needed.    Artificial Saliva (BIOTENE ORALBALANCE DRY MOUTH) GEL Use as directed 1 application in  the mouth or throat daily as needed. Apply a pea size amount to mucous membrane for dry mouth   calcium-vitamin D (OSCAL WITH D) 500-200 MG-UNIT tablet Take 1 tablet by mouth 2 (two) times daily.    capsicum (ZOSTRIX) 0.075 % topical cream Apply 1 application. topically 3 (three) times daily.   carbamide peroxide (DEBROX) 6.5 % OTIC solution 5 drops 2 (two) times daily.   diclofenac sodium (VOLTAREN) 1 % GEL Apply 2 g topically at bedtime. Apply to Bilateral shoulders   DULoxetine HCl 60 MG CSDR Take 1 capsule by mouth at bedtime.   finasteride (PROSCAR) 5 MG tablet Take 5 mg by mouth daily.   gabapentin (NEURONTIN) 800 MG tablet Take 800 mg by mouth daily as needed.   guaiFENesin (ROBITUSSIN) 100 MG/5ML liquid Take 10 mLs by mouth every 6 (six) hours as needed for cough or to loosen phlegm.   hypromellose (SYSTANE OVERNIGHT THERAPY) 0.3 % GEL ophthalmic ointment Place 1 application. into both eyes at bedtime.   Infant Care Products [facility] EX) Apply 1 application. topically 3 (three) times daily. Apply to penile area   ipratropium (ATROVENT) 0.06 % nasal spray Place 2 sprays into both nostrils 3 (three) times daily as needed for rhinitis.   Lysine 1000 MG TABS Take 1 tablet by mouth every other day.   Magnesium 250 MG TABS Take 1 tablet by mouth daily.   mupirocin ointment (BACTROBAN) 2 % Apply 1 application. topically 2 (two) times daily.   naloxegol oxalate (MOVANTIK) 12.5 MG TABS tablet Take  12.5 mg by mouth every morning.   naloxone (NARCAN) nasal spray 4 mg/0.1 mL Place into the nose.   ondansetron (ZOFRAN) 4 MG tablet Take 4 mg by mouth every 6 (six) hours as needed for nausea or vomiting.   oxyCODONE (OXY IR/ROXICODONE) 5 MG immediate release tablet Take 5 mg by mouth every 6 (six) hours as needed for severe pain.   polyethylene glycol (MIRALAX / GLYCOLAX) packet Take 17 g by mouth daily.   pregabalin (LYRICA) 200 MG capsule Take 200 mg by mouth 2 (two) times daily.   psyllium  (HYDROCIL/METAMUCIL) 95 % PACK Take 1 packet by mouth daily.   senna-docusate (SENOKOT-S) 8.6-50 MG tablet Take 1 tablet by mouth as directed. Take 1 tablet every morning Take 1 tablet at bedtime on Monday, Wednesday and Friday.   simethicone (MYLICON) 125 MG chewable tablet Chew 125 mg by mouth every 6 (six) hours as needed for flatulence.   tamsulosin (FLOMAX) 0.4 MG CAPS capsule Take 0.4 mg by mouth daily.    triamcinolone cream (KENALOG) 0.1 % Apply 1 application. topically 2 (two) times daily.   vitamin C (ASCORBIC ACID) 500 MG tablet Take 500 mg by mouth daily.    No facility-administered encounter medications on file as of 10/23/2021.    Surgical History: Past Surgical History:  Procedure Laterality Date   CARPAL TUNNEL RELEASE Left 03/16/2016   Dr. Kennedy Bucker   CATARACT EXTRACTION W/PHACO Right 01/04/2020   Procedure: CATARACT EXTRACTION PHACO AND INTRAOCULAR LENS PLACEMENT (IOC) RIGHT;  Surgeon: Nevada Crane, MD;  Location: [facility];  Service: Ophthalmology;  Laterality: Right;  4.77 0:34.4   CERVICAL SPINE SURGERY     CORNEAL EYE SURGERY Right 09/08/2014   PTK (Dr. Chales Abrahams)   HEMORRHOID SURGERY     HERNIA REPAIR     left side   INTRATHECAL PUMP IMPLANTATION  04/28/2014   Procedure: REVISION INTRATHECAL CATH; Surgeon: Burman Blacksmith, MD; Location: [facility] OR; Service: Anes/ Pain Mgmt; Laterality: N/A;   JOINT REPLACEMENT Bilateral    bilateral knee replacements   LUMBAR SPINE SURGERY     PAIN PUMP IMPLANTATION     multiple procedures for removal and insertion this is his 3rd pain pump   PAIN PUMP REMOVAL     multiple procedures for removal and insertion this is his 3rd pain pump   Proleive System Treatment  2006   ROTATOR CUFF REPAIR Right    x2   SPINAL CORD STIMULATOR IMPLANT     x2   THORACIC SPINE SURGERY     TONSILLECTOMY      Medical History: Past Medical History:  Diagnosis Date   Abdominal pain    other specified site   Abnormal  gait    Advance directive in chart 10/12/2015   Anemia    Arthritis    BPH (benign prostatic hyperplasia)    Cataract    Cervical spondylosis without myelopathy    Cognitive dysfunction    DDD (degenerative disc disease), cervical 06/29/2013   Degeneration of cervical intervertebral disc    Degeneration of lumbar intervertebral disc    Depression    Excessive daytime sleepiness    Generalized osteoarthritis of multiple sites 11/19/2013   GERD (gastroesophageal reflux disease)    Hallux varus, acquired    Hiatal hernia    Hyperlipidemia    Hypertension    (12/30/19 - pt denies)   Hypogonadism in male    Impaired functional mobility, balance, gait, and endurance 11/14/2015   uses motorized  wheelchair   Insomnia 06/29/2013   Low back pain    Lumbosacral spondylosis without myelopathy    Memory loss    Mild cognitive impairment    Neurogenic bladder    OP (osteoporosis)    Organic sleep apnea    Osteoporosis 03/15/2004   Peripheral neuropathy    Radial nerve palsy    Scoliosis 06/29/2013   Seizure ([place]) 07/31/2016   Sleep apnea    CPAP to bring DOS sleep study done in [place]   Spinal compression fracture [facility])    Spinal stenosis    Spondylolisthesis, acquired    Urinary incontinence, stress, male 08/10/2015   Urinary urgency    Uses hearing aid 10/12/2015   both ears   Uses wheelchair    Venous stasis    Venous stasis dermatitis of both lower extremities 06/29/2013   Vision abnormalities    Vitamin B12 deficiency 09/27/2016   Wrist drop, bilateral     Family History: Non contributory to the present illness  Social History: Social History   Socioeconomic History   Marital status: Married    Spouse name: Not on file   Number of children: Not on file   Years of education: Not on file   Highest education level: Not on file  Occupational History   Not on file  Tobacco Use   Smoking status: Never   Smokeless tobacco: Never  Vaping Use   Vaping Use:  Never used  Substance and Sexual Activity   Alcohol use: No    Alcohol/week: 0.0 standard drinks of alcohol    Comment: history of alcohol addiction, attends regular AA meetings   Drug use: No   Sexual activity: Not on file  Other Topics Concern   Not on file  Social History Narrative   Not on file   Social Determinants of Health   Financial Resource Strain: Not on file  Food Insecurity: Not on file  Transportation Needs: Not on file  Physical Activity: Not on file  Stress: Not on file  Social Connections: Not on file  Intimate Partner Violence: Not on file    Vital Signs: Blood pressure 125/72, pulse 64, resp. rate 16, height 5\' 6"  (1.676 m), weight 173 lb (78.5 kg), SpO2 98 %. Body mass index is 27.92 kg/m.    Examination: General Appearance: The patient is well-developed, well-nourished, and in no distress. Neck Circumference: 38 cm Skin: Gross inspection of skin unremarkable. Head: normocephalic, no gross deformities. Eyes: no gross deformities noted. ENT: ears appear grossly normal Neurologic: Alert and oriented. No involuntary movements.  STOP BANG RISK ASSESSMENT S (snore) Have you been told that you snore?     NO   T (tired) Are you often tired, fatigued, or sleepy during the day?   NO  O (obstruction) Do you stop breathing, choke, or gasp during sleep? NO   P (pressure) Do you have or are you being treated for high blood pressure? NO   B (BMI) Is your body index greater than 35 kg/m? NO   A (age) Are you 23 years old or older? YES   N (neck) Do you have a neck circumference greater than 16 inches?   NO   G (gender) Are you a male? YES   TOTAL STOP/BANG "YES" ANSWERS 2       A STOP-Bang score of 2 or less is considered low risk, and a score of 5 or more is high risk for having either moderate or severe OSA. For people  who score 3 or 4, doctors may need to perform further assessment to determine how likely they are to have OSA.         EPWORTH  SLEEPINESS SCALE:  Scale:  (0)= no chance of dozing; (1)= slight chance of dozing; (2)= moderate chance of dozing; (3)= high chance of dozing  Chance  Situtation    Sitting and reading: 1    Watching TV: 0    Sitting Inactive in public: 1    As a passenger in car: 1      Lying down to rest: 0    Sitting and talking: 0    Sitting quielty after lunch: 1    In a car, stopped in traffic: 0   TOTAL SCORE:   4 out of 24    SLEEP STUDIES:  PSG (05/08/15) AHI 7.4, SPO2  87% TITRATION (06/14/20)  CPAP AT 9 CMH20   CPAP COMPLIANCE DATA:  Date Range: 11/28/20 - 10/22/21  Average Daily Use: 7 hours 45 minutes  Median Use: 8 hours 7 minutes  Compliance for > 4 Hours: 322 days  AHI: 16.2 respiratory events per hour  Days Used: 325/329  Mask Leak: 90  95th Percentile Pressure: 13.4 cmh20         LABS: No results found for this or any previous visit (from the past 2160 hour(s)).  Radiology: CT Renal Stone Study  Result Date: 06/21/2021 CLINICAL DATA:  Generalized weakness and flank pain for several days, initial encounter EXAM: CT ABDOMEN AND PELVIS WITHOUT CONTRAST TECHNIQUE: Multidetector CT imaging of the abdomen and pelvis was performed following the standard protocol without IV contrast. RADIATION DOSE REDUCTION: This exam was performed according to the departmental dose-optimization program which includes automated exposure control, adjustment of the mA and/or kV according to patient size and/or use of iterative reconstruction technique. COMPARISON:  CT of the chest from 04/23/2017 FINDINGS: Lower chest: Lung bases demonstrate evidence of a moderate-sized hiatal hernia with compressive atelectasis in the left lower lobe. Hepatobiliary: Liver shows scattered hypodensities measuring up to 2.5 cm consistent with simple cysts. The gallbladder is within normal limits. Pancreas: Unremarkable. No pancreatic ductal dilatation or surrounding inflammatory changes. Spleen:  Normal in size without focal abnormality. Adrenals/Urinary Tract: Adrenal glands are within normal limits. Kidneys are well visualized bilaterally without renal calculi. Prominent extrarenal pelvis is noted on the left although no obstructive changes are seen. The bladder is decompressed by Foley catheter. Stomach/Bowel: Mild fecal material is noted throughout the colon consistent with a degree of constipation. The appendix is not well visualized although no inflammatory changes to suggest appendicitis are seen. Stomach and small bowel are within normal limits aside from the previously mentioned hiatal hernia. Vascular/Lymphatic: Aortic atherosclerosis. No enlarged abdominal or pelvic lymph nodes. Reproductive: Prostate is unremarkable. Other: No abdominal wall hernia or abnormality. No abdominopelvic ascites. Musculoskeletal: Degenerative changes of lumbar spine are noted. Chronic likely posttraumatic changes are noted in the right ilium. A pain pump is noted in the anterior abdomen. IMPRESSION: Moderate-sized hiatal hernia. Changes of mild colonic constipation. No acute abnormality to correspond with the given clinical history is noted. Electronically Signed   By: Alcide CleverMark  Lukens M.D.   On: 06/21/2021 20:24   DG Chest 1 View  Result Date: 06/21/2021 CLINICAL DATA:  Weakness EXAM: CHEST  1 VIEW COMPARISON:  01/01/2017, CT 04/23/2017 FINDINGS: Moderate hiatal hernia. Streaky atelectasis or scar at the right base. Streaky probable atelectasis left base. Probable air-filled loop of bowel in the right upper quadrant.  IMPRESSION: 1. Streaky basilar opacities, favor atelectasis 2. Moderate hiatal hernia Electronically Signed   By: Jasmine Pang M.D.   On: 06/21/2021 19:18    No results found.  No results found.    Assessment and Plan: Patient Active Problem List   Diagnosis Date Noted   Complex sleep apnea syndrome 10/23/2021   E coli bacteremia 06/22/2021   Severe sepsis (HCC) 06/21/2021   AKI (acute  kidney injury) (HCC) 06/21/2021   UTI (urinary tract infection) due to urinary indwelling Foley catheter (HCC) 06/21/2021   OSA on CPAP 05/23/2020   CPAP use counseling 05/23/2020   Chronic pain syndrome 10/26/2017   Cerebellar stroke (HCC) 01/01/2017   Seizures (HCC) 07/31/2016   Chronic venous insufficiency 04/09/2016   Hypogonadism in male 05/27/2015   BPH with obstruction/lower urinary tract symptoms 04/15/2015   Anemia 07/23/2014   Narrowing of intervertebral disc space 07/23/2014   Depression with anxiety 07/23/2014   GERD without esophagitis 07/23/2014   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   Scoliosis 07/23/2014   Compressed spine fracture (HCC) 07/23/2014   Spinal stenosis 07/23/2014   Obstructive apnea 12/08/2013   Generalized OA 11/19/2013   Idiopathic scoliosis and kyphoscoliosis 08/20/2013   Osteoporosis 07/30/2013   Dermatitis, stasis 06/29/2013   Curvature of spine 10/10/2012   Acquired deformities of spine 10/10/2012   Cervical spondylosis without myelopathy 05/03/2011   Cervical osteoarthritis 05/03/2011   DDD (degenerative disc disease), lumbosacral 05/03/2011   Angulation of spine 05/03/2011   Degenerative arthritis of lumbar spine 05/03/2011   Lumbosacral spondylosis 05/03/2011   Back pain, chronic 03/29/2011   Benign fibroma of prostate 03/29/2011   Arthritis 08/07/2010   Constipation due to opioid therapy 08/07/2010    1. Complex sleep apnea syndrome The patient has been having more apneas in the past several months. He is having complex apnea, with a lot of centrals. He also has a high mask leak. I have recommended cpap/bipap titration as we have reduced his pressure and he remains uncontrolled. I have advised him to get a mask fitting, but we are not his dme supplies and he receives his supplies from an online supplier. He will be fitted when he comes for titration, and he will ask his supplier to provide that mask.  The patient was reminded how to clean  equipment and advised to replace supplies routinely. The patient was also counselled on weight loss. The compliance is excellent. The AHI is 11.8 on the new pressure of 6 to 13, was previously at 22.2 at 8 to 15. .   Complex sleep apnea syndrome- not controlled. Recommend cpap/bipap titration in light of poor control. He has no access to mask fitting and having the tech try a new mask will be helpful as well. Recommend cpap/bipap titration.    2. CPAP use counseling CPAP Counseling: had a lengthy discussion with the patient regarding the importance of PAP therapy in management of the sleep apnea. Patient appears to understand the risk factor reduction and also understands the risks associated with untreated sleep apnea. Patient will try to make a good faith effort to remain compliant with therapy. Also instructed the patient on proper cleaning of the device including the water must be changed daily if possible and use of distilled water is preferred. Patient understands that the machine should be regularly cleaned with appropriate recommended cleaning solutions that do not damage the PAP machine for example given white vinegar and water rinses. Other methods such as ozone treatment may not be as good  as these simple methods to achieve cleaning.    General Counseling: I have discussed the findings of the evaluation and examination with Mikael.  I have also discussed any further diagnostic evaluation thatmay be needed or ordered today. Dwain verbalizes understanding of the findings of todays visit. We also reviewed his medications today and discussed drug interactions and side effects including but not limited excessive drowsiness and altered mental states. We also discussed that there is always a risk not just to him but also people around him. he has been encouraged to call the office with any questions or concerns that should arise related to todays visit.  No orders of the defined types were placed in this  encounter.       I have personally obtained a history, examined the patient, evaluated laboratory and imaging results, formulated the assessment and plan and placed orders. This patient was seen today by Sarah Terrell, PA-C in [facility] Dr. Freda Munro.   Yevonne Pax, MD [facility] Diplomate ABMS Pulmonary Critical Care Medicine and Sleep Medicine

## 2021-10-23 NOTE — Patient Instructions (Signed)

## 2021-12-28 ENCOUNTER — Other Ambulatory Visit: Payer: Self-pay | Admitting: Gastroenterology

## 2021-12-28 DIAGNOSIS — R1011 Right upper quadrant pain: Secondary | ICD-10-CM

## 2022-03-12 ENCOUNTER — Ambulatory Visit (INDEPENDENT_AMBULATORY_CARE_PROVIDER_SITE_OTHER): Payer: Medicare HMO | Admitting: Internal Medicine

## 2022-03-12 VITALS — BP 112/70 | HR 71 | Resp 16 | Ht 66.0 in | Wt 173.0 lb

## 2022-03-12 DIAGNOSIS — G4731 Primary central sleep apnea: Secondary | ICD-10-CM | POA: Diagnosis not present

## 2022-03-12 DIAGNOSIS — Z7189 Other specified counseling: Secondary | ICD-10-CM

## 2022-03-12 NOTE — Progress Notes (Signed)
[facility] [place], [place] [address]  Pulmonary Sleep Medicine   Office Visit Note  Patient Name: Dustin Turner DOB: 1936-01-04 MRN [id number]    Chief Complaint: Obstructive Sleep Apnea visit  Brief History:  Sincer is seen today for follow up on APAP at 6-13 cmh20. The patient has a 7 year history of sleep apnea. Patient is using PAP nightly.  The patient feels rested after sleeping with PAP.  The patient reports benefiting from PAP use. Reported sleepiness is  improved and the Epworth Sleepiness Score is 14 out of 24. The patient does not take naps. The patient complains of the following: No complaints he feels better on his current pressure.  The compliance download shows 100% compliance with an average use time of 8 hours 6 minutes. The AHI is 8.5.  The patient does not complain of limb movements disrupting sleep. He complains of daytime sleepiness that is new since his last appointment. His apnea is now in better control, although not optimally controlled. He did have a titration done in November which recommended a pressure of 10, but trying that did not improve his apnea, it in fact worsened, so we have gone back to APAP 6-13.    ROS  General: (-) fever, (-) chills, (-) night sweat Nose and Sinuses: (-) nasal stuffiness or itchiness, (-) postnasal drip, (-) nosebleeds, (-) sinus trouble. Mouth and Throat: (-) sore throat, (-) hoarseness. Neck: (-) swollen glands, (-) enlarged thyroid, (-) neck pain. Respiratory: - cough, - shortness of breath, - wheezing. Neurologic: - numbness, - tingling. Psychiatric: - anxiety, - depression   Current Medication: Outpatient Encounter Medications as of 03/12/2022  Medication Sig   MOVANTIK 25 MG TABS tablet Take by mouth.   acetaminophen (TYLENOL) 500 MG tablet Take 1,000 mg by mouth every 6 (six) hours as needed.   alendronate (FOSAMAX) 70 MG tablet Take 70 mg by mouth once a week. On Thursday   alum & mag  hydroxide-simeth (MAALOX PLUS) 400-400-40 MG/5ML suspension Take 15 mLs by mouth every 4 (four) hours as needed. Give 30 ml by mouth daily with meals as needed   ammonium lactate (LAC-HYDRIN) 12 % lotion Apply 1 application topically 2 (two) times daily as needed.    Artificial Saliva (BIOTENE ORALBALANCE DRY MOUTH) GEL Use as directed 1 application in the mouth or throat daily as needed. Apply a pea size amount to mucous membrane for dry mouth   calcium-vitamin D (OSCAL WITH D) 500-200 MG-UNIT tablet Take 1 tablet by mouth 2 (two) times daily.    capsicum (ZOSTRIX) 0.075 % topical cream Apply 1 application. topically 3 (three) times daily.   carbamide peroxide (DEBROX) 6.5 % OTIC solution 5 drops 2 (two) times daily.   diclofenac sodium (VOLTAREN) 1 % GEL Apply 2 g topically at bedtime. Apply to Bilateral shoulders   DULoxetine HCl 60 MG CSDR Take 1 capsule by mouth at bedtime.   finasteride (PROSCAR) 5 MG tablet Take 5 mg by mouth daily.   hypromellose (SYSTANE OVERNIGHT THERAPY) 0.3 % GEL ophthalmic ointment Place 1 application. into both eyes at bedtime.   Infant Care Products [facility] EX) Apply 1 application. topically 3 (three) times daily. Apply to penile area   ipratropium (ATROVENT) 0.06 % nasal spray Place 2 sprays into both nostrils 3 (three) times daily as needed for rhinitis.   Lysine 1000 MG TABS Take 1 tablet by mouth every other day.   Magnesium 250 MG TABS Take 1 tablet by  mouth daily.   mupirocin ointment (BACTROBAN) 2 % Apply 1 application. topically 2 (two) times daily.   naloxone (NARCAN) nasal spray 4 mg/0.1 mL Place into the nose.   ondansetron (ZOFRAN) 4 MG tablet Take 4 mg by mouth every 6 (six) hours as needed for nausea or vomiting.   oxyCODONE (OXY IR/ROXICODONE) 5 MG immediate release tablet Take 5 mg by mouth every 6 (six) hours as needed for severe pain.   polyethylene glycol (MIRALAX / GLYCOLAX) packet Take 17 g by mouth daily.   pregabalin (LYRICA) 200 MG  capsule Take 200 mg by mouth 2 (two) times daily.   psyllium (HYDROCIL/METAMUCIL) 95 % PACK Take 1 packet by mouth daily.   senna-docusate (SENOKOT-S) 8.6-50 MG tablet Take 1 tablet by mouth as directed. Take 1 tablet every morning Take 1 tablet at bedtime on Monday, Wednesday and Friday.   simethicone (MYLICON) 0000000 MG chewable tablet Chew 125 mg by mouth every 6 (six) hours as needed for flatulence.   tamsulosin (FLOMAX) 0.4 MG CAPS capsule Take 0.4 mg by mouth daily.    triamcinolone cream (KENALOG) 0.1 % Apply 1 application. topically 2 (two) times daily.   vitamin C (ASCORBIC ACID) 500 MG tablet Take 500 mg by mouth daily.    [DISCONTINUED] gabapentin (NEURONTIN) 800 MG tablet Take 800 mg by mouth daily as needed.   [DISCONTINUED] guaiFENesin (ROBITUSSIN) 100 MG/5ML liquid Take 10 mLs by mouth every 6 (six) hours as needed for cough or to loosen phlegm.   [DISCONTINUED] naloxegol oxalate (MOVANTIK) 12.5 MG TABS tablet Take 12.5 mg by mouth every morning.   No facility-administered encounter medications on file as of 03/12/2022.    Surgical History: Past Surgical History:  Procedure Laterality Date   CARPAL TUNNEL RELEASE Left 03/16/2016   Dr. Hessie Knows   CATARACT EXTRACTION W/PHACO Right 01/04/2020   Procedure: CATARACT EXTRACTION PHACO AND INTRAOCULAR LENS PLACEMENT ([place]) RIGHT;  Surgeon: Eulogio Bear, MD;  Location: [place];  Service: Ophthalmology;  Laterality: Right;  4.77 0:34.4   CERVICAL SPINE SURGERY     CORNEAL EYE SURGERY Right 09/08/2014   [place] (Dr. Lyndel Safe)   [place]     left side   INTRATHECAL PUMP IMPLANTATION  04/28/2014   Procedure: REVISION INTRATHECAL CATH; Surgeon: Marinda Elk, MD; Location: [facility] OR; Service: Anes/ Pain Mgmt; Laterality: N/A;   JOINT REPLACEMENT Bilateral    bilateral knee replacements   LUMBAR SPINE SURGERY     PAIN PUMP IMPLANTATION     multiple procedures for removal and insertion  this is his 3rd pain pump   PAIN PUMP REMOVAL     multiple procedures for removal and insertion this is his 3rd pain pump   Proleive System Treatment  2006   ROTATOR CUFF REPAIR Right    x2   SPINAL CORD STIMULATOR IMPLANT     x2   THORACIC SPINE SURGERY     TONSILLECTOMY      Medical History: Past Medical History:  Diagnosis Date   Abdominal pain    other specified site   Abnormal gait    Advance directive in chart 10/12/2015   Anemia    Arthritis    BPH (benign prostatic hyperplasia)    Cataract    Cervical spondylosis without myelopathy    Cognitive dysfunction    DDD (degenerative disc disease), cervical 06/29/2013   Degeneration of cervical intervertebral disc    Degeneration of lumbar intervertebral disc  Depression    Excessive daytime sleepiness    Generalized osteoarthritis of multiple sites 11/19/2013   GERD (gastroesophageal reflux disease)    Hallux varus, acquired    Hiatal hernia    Hyperlipidemia    Hypertension    (12/30/19 - pt denies)   Hypogonadism in male    Impaired functional mobility, balance, gait, and endurance 11/14/2015   uses motorized wheelchair   Insomnia 06/29/2013   Low back pain    Lumbosacral spondylosis without myelopathy    Memory loss    Mild cognitive impairment    Neurogenic bladder    OP (osteoporosis)    Organic sleep apnea    Osteoporosis 03/15/2004   Peripheral neuropathy    Radial nerve palsy    Scoliosis 06/29/2013   Seizure ([place]) 07/31/2016   Sleep apnea    CPAP to bring DOS sleep study done in [place]   Spinal compression fracture ([place])    Spinal stenosis    Spondylolisthesis, acquired    Urinary incontinence, stress, male 08/10/2015   Urinary urgency    Uses hearing aid 10/12/2015   both ears   Uses wheelchair    Venous stasis    Venous stasis dermatitis of both lower extremities 06/29/2013   Vision abnormalities    Vitamin B12 deficiency 09/27/2016   Wrist drop, bilateral     Family  History: Non contributory to the present illness  Social History: Social History   Socioeconomic History   Marital status: Married    Spouse name: Not on file   Number of children: Not on file   Years of education: Not on file   Highest education level: Not on file  Occupational History   Not on file  Tobacco Use   Smoking status: Never   Smokeless tobacco: Never  Vaping Use   Vaping Use: Never used  Substance and Sexual Activity   Alcohol use: No    Alcohol/week: 0.0 standard drinks of alcohol    Comment: history of alcohol addiction, attends regular AA meetings   Drug use: No   Sexual activity: Not on file  Other Topics Concern   Not on file  Social History Narrative   Not on file   Social Determinants of Health   Financial Resource Strain: Not on file  Food Insecurity: Not on file  Transportation Needs: Not on file  Physical Activity: Not on file  Stress: Not on file  Social Connections: Not on file  Intimate Partner Violence: Not on file    Vital Signs: Blood pressure 112/70, pulse 71, resp. rate 16, height '5\' 6"'$  (1.676 m), weight 173 lb (78.5 kg), SpO2 98 %. Body mass index is 27.92 kg/m.    Examination: General Appearance: The patient is well-developed, well-nourished, and in no distress. Neck Circumference: 38 cm Skin: Gross inspection of skin unremarkable. Head: normocephalic, no gross deformities. Eyes: no gross deformities noted. ENT: ears appear grossly normal Neurologic: Alert and oriented. No involuntary movements.  STOP BANG RISK ASSESSMENT S (snore) Have you been told that you snore?     NO   T (tired) Are you often tired, fatigued, or sleepy during the day?   YES  O (obstruction) Do you stop breathing, choke, or gasp during sleep? NO   P (pressure) Do you have or are you being treated for high blood pressure? NO   B (BMI) Is your body index greater than 35 kg/m? NO   A (age) Are you 26 years old or older? YES  N (neck) Do you have  a neck circumference greater than 16 inches?   NO   G (gender) Are you a male? YES   TOTAL STOP/BANG "YES" ANSWERS 3       A STOP-Bang score of 2 or less is considered low risk, and a score of 5 or more is high risk for having either moderate or severe OSA. For people who score 3 or 4, doctors may need to perform further assessment to determine how likely they are to have OSA.         EPWORTH SLEEPINESS SCALE:  Scale:  (0)= no chance of dozing; (1)= slight chance of dozing; (2)= moderate chance of dozing; (3)= high chance of dozing  Chance  Situtation    Sitting and reading: 3    Watching TV: 0    Sitting Inactive in public: 2    As a passenger in car: 2      Lying down to rest: 3    Sitting and talking: 1    Sitting quielty after lunch: 3    In a car, stopped in traffic: 0   TOTAL SCORE:   14 out of 24    SLEEP STUDIES:  PSG (05/08/15) AHI 7.4, 67% central, min SPO2 87%, recommended BIPAP titration TITRATION (06/01/16) recommended BIPAP ST at 17/11 cmh20 back up 10 bpm TITRATION (11/21/21) recommended CPAP at 10 cmh20   CPAP COMPLIANCE DATA:  Date Range: 01/05/22 - 02/22/ 24  Average Daily Use: 8 hours 6 minutes  Median Use: 8 hours 10 minutes  Compliance for > 4 Hours: 63 days  AHI: 8.5 respiratory events per hour  Days Used: 63/63  Mask Leak: 34.7  95th Percentile Pressure: 11.7 cmh20         LABS: No results found for this or any previous visit (from the past 2160 hour(s)).  Radiology: CT Renal Stone Study  Result Date: 06/21/2021 CLINICAL DATA:  Generalized weakness and flank pain for several days, initial encounter EXAM: CT ABDOMEN AND PELVIS WITHOUT CONTRAST TECHNIQUE: Multidetector CT imaging of the abdomen and pelvis was performed following the standard protocol without IV contrast. RADIATION DOSE REDUCTION: This exam was performed according to the departmental dose-optimization program which includes automated exposure control,  adjustment of the mA and/or kV according to patient size and/or use of iterative reconstruction technique. COMPARISON:  CT of the chest from 04/23/2017 FINDINGS: Lower chest: Lung bases demonstrate evidence of a moderate-sized hiatal hernia with compressive atelectasis in the left lower lobe. Hepatobiliary: Liver shows scattered hypodensities measuring up to 2.5 cm consistent with simple cysts. The gallbladder is within normal limits. Pancreas: Unremarkable. No pancreatic ductal dilatation or surrounding inflammatory changes. Spleen: Normal in size without focal abnormality. Adrenals/Urinary Tract: Adrenal glands are within normal limits. Kidneys are well visualized bilaterally without renal calculi. Prominent extrarenal pelvis is noted on the left although no obstructive changes are seen. The bladder is decompressed by Foley catheter. Stomach/Bowel: Mild fecal material is noted throughout the colon consistent with a degree of constipation. The appendix is not well visualized although no inflammatory changes to suggest appendicitis are seen. Stomach and small bowel are within normal limits aside from the previously mentioned hiatal hernia. Vascular/Lymphatic: Aortic atherosclerosis. No enlarged abdominal or pelvic lymph nodes. Reproductive: Prostate is unremarkable. Other: No abdominal wall hernia or abnormality. No abdominopelvic ascites. Musculoskeletal: Degenerative changes of lumbar spine are noted. Chronic likely posttraumatic changes are noted in the right ilium. A pain pump is noted in the anterior abdomen. IMPRESSION:  Moderate-sized hiatal hernia. Changes of mild colonic constipation. No acute abnormality to correspond with the given clinical history is noted. Electronically Signed   By: Inez Catalina M.D.   On: 06/21/2021 20:24   DG Chest 1 View  Result Date: 06/21/2021 CLINICAL DATA:  Weakness EXAM: CHEST  1 VIEW COMPARISON:  01/01/2017, CT 04/23/2017 FINDINGS: Moderate hiatal hernia. Streaky atelectasis  or scar at the right base. Streaky probable atelectasis left base. Probable air-filled loop of bowel in the right upper quadrant. IMPRESSION: 1. Streaky basilar opacities, favor atelectasis 2. Moderate hiatal hernia Electronically Signed   By: Donavan Foil M.D.   On: 06/21/2021 19:18    No results found.  No results found.    Assessment and Plan: Patient Active Problem List   Diagnosis Date Noted   Complex sleep apnea syndrome 10/23/2021   E coli bacteremia 06/22/2021   Severe sepsis ([place]) 06/21/2021   AKI (acute kidney injury) ([place]) 06/21/2021   UTI (urinary tract infection) due to urinary indwelling Foley catheter ([place]) 06/21/2021   OSA on CPAP 05/23/2020   CPAP use counseling 05/23/2020   Chronic pain syndrome 10/26/2017   Presence of intrathecal pump 06/27/2017   OAB (overactive bladder) 04/05/2017   Dural venous sinus thrombosis 03/15/2017   Cerebellar stroke ([place]) 01/01/2017   Knee pain, bilateral 08/31/2016   Seizures ([place]) 07/31/2016   Chronic venous insufficiency 04/09/2016   Impaired functional mobility, balance, gait, and endurance 11/14/2015   Hypogonadism in male 05/27/2015   BPH with obstruction/lower urinary tract symptoms 04/15/2015   Anemia 07/23/2014   Narrowing of intervertebral disc space 07/23/2014   Depression with anxiety 07/23/2014   GERD without esophagitis 07/23/2014   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   Scoliosis 07/23/2014   Compressed spine fracture ([place]) 07/23/2014   Spinal stenosis 07/23/2014   Obstructive apnea 12/08/2013   Generalized OA 11/19/2013   Idiopathic scoliosis and kyphoscoliosis 08/20/2013   Osteoporosis 07/30/2013   Dermatitis, stasis 06/29/2013   Curvature of spine 10/10/2012   Acquired deformities of spine 10/10/2012   Cervical spondylosis without myelopathy 05/03/2011   Cervical osteoarthritis 05/03/2011   DDD (degenerative disc disease), lumbosacral 05/03/2011   Angulation of spine 05/03/2011   Degenerative arthritis of  lumbar spine 05/03/2011   Lumbosacral spondylosis 05/03/2011   Back pain, chronic 03/29/2011   Benign fibroma of prostate 03/29/2011   Arthritis 08/07/2010   Constipation due to opioid therapy 08/07/2010   1. Complex sleep apnea syndrome The patient does tolerate PAP and reports  benefit from PAP use. His apnea is not optimally controlled, but it is much improved. He does complain of some increased sleepiness in the last 6 weeks. I have advised he see him PCP for evaluation, and undergo testing to be sure there is not a medical reason that would be causing that, as I think with his apnea being better controlled he should be feeling more rested, not less. He might be a candidate for Wakix or Modafinil to help with his daytime sleepiness, although at his age and with his other medications that would need to be undertaken with caution. The patient was reminded how to clean equipment and advised to replace supplies routinely.  The compliance is excellent. The AHI is 8.5   OSA on CPAP- not optimally controlled but improved at 6-13 cm. Will continue this, his recommended pressure on titration was 10 but his apnea was significantlly worse. His sleepiness may be related to his pain medication, or a medical condition. He will see his PCP for  evaluation. If no cause is found, a low dose stimulant could be considered.    2. CPAP use counseling CPAP Counseling: had a lengthy discussion with the patient regarding the importance of PAP therapy in management of the sleep apnea. Patient appears to understand the risk factor reduction and also understands the risks associated with untreated sleep apnea. Patient will try to make a good faith effort to remain compliant with therapy. Also instructed the patient on proper cleaning of the device including the water must be changed daily if possible and use of distilled water is preferred. Patient understands that the machine should be regularly cleaned with appropriate  recommended cleaning solutions that do not damage the PAP machine for example given white vinegar and water rinses. Other methods such as ozone treatment may not be as good as these simple methods to achieve cleaning.     General Counseling: I have discussed the findings of the evaluation and examination with [place].  I have also discussed any further diagnostic evaluation thatmay be needed or ordered today. Shavar verbalizes understanding of the findings of todays visit. We also reviewed his medications today and discussed drug interactions and side effects including but not limited excessive drowsiness and altered mental states. We also discussed that there is always a risk not just to him but also people around him. he has been encouraged to call the office with any questions or concerns that should arise related to todays visit.  No orders of the defined types were placed in this encounter.       I have personally obtained a history, examined the patient, evaluated laboratory and imaging results, formulated the assessment and plan and placed orders. This patient was seen today by Tressie Ellis, PA-C in collaboration with Dr. Devona Konig.   Allyne Gee, MD [facility] Diplomate ABMS Pulmonary Critical Care Medicine and Sleep Medicine

## 2022-03-12 NOTE — Patient Instructions (Signed)

## 2022-03-19 ENCOUNTER — Other Ambulatory Visit (INDEPENDENT_AMBULATORY_CARE_PROVIDER_SITE_OTHER): Payer: Self-pay | Admitting: Nurse Practitioner

## 2022-03-19 DIAGNOSIS — R0989 Other specified symptoms and signs involving the circulatory and respiratory systems: Secondary | ICD-10-CM

## 2022-03-19 DIAGNOSIS — L819 Disorder of pigmentation, unspecified: Secondary | ICD-10-CM

## 2022-03-19 DIAGNOSIS — M79605 Pain in left leg: Secondary | ICD-10-CM

## 2022-03-21 ENCOUNTER — Ambulatory Visit (INDEPENDENT_AMBULATORY_CARE_PROVIDER_SITE_OTHER): Payer: Medicare HMO

## 2022-03-21 ENCOUNTER — Ambulatory Visit (INDEPENDENT_AMBULATORY_CARE_PROVIDER_SITE_OTHER): Payer: Medicare HMO | Admitting: Nurse Practitioner

## 2022-03-21 ENCOUNTER — Encounter (INDEPENDENT_AMBULATORY_CARE_PROVIDER_SITE_OTHER): Payer: Self-pay | Admitting: Nurse Practitioner

## 2022-03-21 ENCOUNTER — Telehealth (INDEPENDENT_AMBULATORY_CARE_PROVIDER_SITE_OTHER): Payer: Self-pay | Admitting: Nurse Practitioner

## 2022-03-21 VITALS — BP 122/76 | HR 69 | Resp 18 | Ht 66.0 in | Wt 180.0 lb

## 2022-03-21 DIAGNOSIS — L819 Disorder of pigmentation, unspecified: Secondary | ICD-10-CM

## 2022-03-21 DIAGNOSIS — M79605 Pain in left leg: Secondary | ICD-10-CM

## 2022-03-21 DIAGNOSIS — S91302A Unspecified open wound, left foot, initial encounter: Secondary | ICD-10-CM

## 2022-03-21 DIAGNOSIS — R0989 Other specified symptoms and signs involving the circulatory and respiratory systems: Secondary | ICD-10-CM

## 2022-03-21 DIAGNOSIS — I872 Venous insufficiency (chronic) (peripheral): Secondary | ICD-10-CM

## 2022-03-21 NOTE — Telephone Encounter (Signed)
Patient wanted to wait to make his 3 months follow-up appointment.

## 2022-03-22 ENCOUNTER — Encounter (INDEPENDENT_AMBULATORY_CARE_PROVIDER_SITE_OTHER): Payer: Self-pay | Admitting: Nurse Practitioner

## 2022-03-22 NOTE — Progress Notes (Signed)
Subjective:    Patient ID: Dustin Turner, male    DOB: [birth year], 87 y.o.   MRN: [id number] Chief Complaint  Patient presents with   New Patient (Initial Visit)    NP consult. further evaluation and treatment of LLE pain + bilateral LE are rudy in color , hairless, cool, peripheral edema noted, and weak pedal pulses are felt. referred by [name].       The patient is seen for evaluation of lower extremity discoloration in addition to left lower extremity pain with peripheral edema and decreased pulses.  The patient has known polyneuropathy.  He also has limited mobility and generalized muscle weakness following stroke.  The patient denies rest pain or dangling of an extremity off the side of the bed during the night for relief. No open wounds or sores at this time. No prior interventions or surgeries.  History of back problems or DJD of the lumbar sacral spine.   The patient's blood pressure has been stable and relatively well controlled. The patient denies amaurosis fugax or recent TIA symptoms. There are no recent neurological changes noted. The patient denies history of DVT, PE or superficial thrombophlebitis. The patient denies recent episodes of angina or shortness of breath.   Today noninvasive studies show an ABI of 1.22 on the right and 1.08 on the left.  Patient has triphasic tibial artery waveforms with normal toe waveforms bilaterally.    Review of Systems  Cardiovascular:  Positive for leg swelling.  Skin:  Positive for color change and wound.  All other systems reviewed and are negative.      Objective:   Physical Exam Vitals reviewed.  HENT:     Head: Normocephalic.  Cardiovascular:     Rate and Rhythm: Normal rate.     Pulses:          Dorsalis pedis pulses are detected w/ Doppler on the right side and detected w/ Doppler on the left side.       Posterior tibial pulses are detected w/ Doppler on the right side and detected w/ Doppler on the left  side.  Pulmonary:     Effort: Pulmonary effort is normal.  Skin:    General: Skin is warm and dry.  Neurological:     Mental Status: He is alert and oriented to person, place, and time.  Psychiatric:        Mood and Affect: Mood normal.        Behavior: Behavior normal.        Thought Content: Thought content normal.        Judgment: Judgment normal.     BP 122/76 (BP Location: Left Arm)   Pulse 69   Resp 18   Ht 5\' 6"  (1.676 m)   Wt 180 lb (81.6 kg)   BMI 29.05 kg/m   Past Medical History:  Diagnosis Date   Abdominal pain    other specified site   Abnormal gait    Advance directive in chart 10/12/2015   Anemia    Arthritis    BPH (benign prostatic hyperplasia)    Cataract    Cervical spondylosis without myelopathy    Cognitive dysfunction    DDD (degenerative disc disease), cervical 06/29/2013   Degeneration of cervical intervertebral disc    Degeneration of lumbar intervertebral disc    Depression    Excessive daytime sleepiness    Generalized osteoarthritis of multiple sites 11/19/2013   GERD (gastroesophageal reflux disease)    Hallux varus,  acquired    Hiatal hernia    Hyperlipidemia    Hypertension    (12/30/19 - pt denies)   Hypogonadism in male    Impaired functional mobility, balance, gait, and endurance 11/14/2015   uses motorized wheelchair   Insomnia 06/29/2013   Low back pain    Lumbosacral spondylosis without myelopathy    Memory loss    Mild cognitive impairment    Neurogenic bladder    OP (osteoporosis)    Organic sleep apnea    Osteoporosis 03/15/2004   Peripheral neuropathy    Radial nerve palsy    Scoliosis 06/29/2013   Seizure ([place]) 07/31/2016   Sleep apnea    CPAP to bring DOS sleep study done in [place]   Spinal compression fracture ([place])    Spinal stenosis    Spondylolisthesis, acquired    Urinary incontinence, stress, male 08/10/2015   Urinary urgency    Uses hearing aid 10/12/2015   both ears   Uses wheelchair     Venous stasis    Venous stasis dermatitis of both lower extremities 06/29/2013   Vision abnormalities    Vitamin B12 deficiency 09/27/2016   Wrist drop, bilateral     Social History   Socioeconomic History   Marital status: Married    Spouse name: Not on file   Number of children: Not on file   Years of education: Not on file   Highest education level: Not on file  Occupational History   Not on file  Tobacco Use   Smoking status: Never   Smokeless tobacco: Never  Vaping Use   Vaping Use: Never used  Substance and Sexual Activity   Alcohol use: No    Alcohol/week: 0.0 standard drinks of alcohol    Comment: history of alcohol addiction, attends regular AA meetings   Drug use: No   Sexual activity: Not on file  Other Topics Concern   Not on file  Social History Narrative   Not on file   Social Determinants of Health   Financial Resource Strain: Not on file  Food Insecurity: Not on file  Transportation Needs: Not on file  Physical Activity: Not on file  Stress: Not on file  Social Connections: Not on file  Intimate Partner Violence: Not on file    Past Surgical History:  Procedure Laterality Date   CARPAL TUNNEL RELEASE Left 03/16/2016   Dr. Hessie Knows   CATARACT EXTRACTION W/PHACO Right 01/04/2020   Procedure: CATARACT EXTRACTION PHACO AND INTRAOCULAR LENS PLACEMENT ([place]) RIGHT;  Surgeon: Eulogio Bear, MD;  Location: [place];  Service: Ophthalmology;  Laterality: Right;  4.77 0:34.4   CERVICAL SPINE SURGERY     CORNEAL EYE SURGERY Right 09/08/2014   [place] (Dr. Lyndel Safe)   [place]     left side   INTRATHECAL PUMP IMPLANTATION  04/28/2014   Procedure: REVISION INTRATHECAL CATH; Surgeon: Marinda Elk, MD; Location: [facility]; Service: Anes/ Pain Mgmt; Laterality: N/A;   JOINT REPLACEMENT Bilateral    bilateral knee replacements   LUMBAR SPINE SURGERY     PAIN PUMP IMPLANTATION     multiple procedures for  removal and insertion this is his 3rd pain pump   PAIN PUMP REMOVAL     multiple procedures for removal and insertion this is his 3rd pain pump   Proleive System Treatment  2006   ROTATOR CUFF REPAIR Right    x2   SPINAL CORD STIMULATOR IMPLANT  x2   THORACIC SPINE SURGERY     TONSILLECTOMY      Family History  Problem Relation Age of Onset   Stroke Father    Alcohol abuse Father    Diabetes type II Father    Heart disease Father        pacemaker   Diabetes type II Brother    Heart disease Brother        pacemaker   Stroke Mother    COPD Mother    Alcohol abuse Brother    Arthritis Sister    Glaucoma Paternal Uncle    Sleep apnea Son    Kidney disease Neg Hx    Prostate cancer Neg Hx    Macular degeneration Neg Hx     Allergies  Allergen Reactions   Amitriptyline Rash   Nortriptyline Other (See Comments) and Rash    GI  upset       Latest Ref Rng & Units 06/23/2021    6:01 AM 06/21/2021    6:45 PM 04/02/2018    5:45 AM  CBC  WBC 4.0 - 10.5 K/uL 8.0  16.1  3.6   Hemoglobin 13.0 - 17.0 g/dL 10.2  11.1  11.9   Hematocrit 39.0 - 52.0 % 29.6  34.1  36.8   Platelets 150 - 400 K/uL 89  100  133       CMP     Component Value Date/Time   NA 135 06/23/2021 0601   NA 138 08/21/2016 1517   K 3.4 (L) 06/24/2021 0631   CL 102 06/23/2021 0601   CO2 28 06/23/2021 0601   GLUCOSE 101 (H) 06/23/2021 0601   BUN 26 (H) 06/23/2021 0601   BUN 22 06/27/2016 1548   CREATININE 1.11 06/23/2021 0601   CALCIUM 7.8 (L) 06/23/2021 0601   PROT 6.2 (L) 06/21/2021 1845   ALBUMIN 3.3 (L) 06/21/2021 1845   AST 35 06/21/2021 1845   ALT 17 06/21/2021 1845   ALKPHOS 65 06/21/2021 1845   BILITOT 1.3 (H) 06/21/2021 1845   GFRNONAA >60 06/23/2021 0601   GFRAA >60 11/19/2017 0612     No results found.     Assessment & Plan:   1. Chronic venous insufficiency Patient has some notable stasis dermatitis.  Continue swelling may exacerbate some of the issues with wound healing.   Patient should utilize compression as tolerable.  2. Non healing left heel wound Based on the patient's noninvasive studies he should have adequate perfusion for wound healing.  I suspect his discoloration is more so related to his chronic venous insufficiency.  Given that the patient's wound is on his heel we will maintain a closer follow-up and have him return in 3 months with arterial duplex to evaluate the wounds progression with healing.  Patient is advised that if the wound suddenly worsens and deteriorates reevaluation may be necessary.   Current Outpatient Medications on File Prior to Visit  Medication Sig Dispense Refill   acetaminophen (TYLENOL) 500 MG tablet Take 1,000 mg by mouth every 6 (six) hours as needed.     alendronate (FOSAMAX) 70 MG tablet Take 70 mg by mouth once a week. On Thursday     alum & mag hydroxide-simeth (MAALOX PLUS) 400-400-40 MG/5ML suspension Take 15 mLs by mouth every 4 (four) hours as needed. Give 30 ml by mouth daily with meals as needed     ammonium lactate (LAC-HYDRIN) 12 % lotion Apply 1 application topically 2 (two) times daily as needed.  ammonium lactate (LAC-HYDRIN) 12 % lotion Ammonium Lactate     Artificial Saliva (BIOTENE ORALBALANCE DRY MOUTH) GEL Use as directed 1 application in the mouth or throat daily as needed. Apply a pea size amount to mucous membrane for dry mouth     calcium-vitamin D (OSCAL WITH D) 500-200 MG-UNIT tablet Take 1 tablet by mouth 2 (two) times daily.      capsicum (ZOSTRIX) 0.075 % topical cream Apply 1 application. topically 3 (three) times daily.     carbamide peroxide (DEBROX) 6.5 % OTIC solution 5 drops 2 (two) times daily.     diclofenac Sodium (VOLTAREN ARTHRITIS PAIN) 1 % GEL Apply 2 g topically 1 day or 1 dose.     diclofenac sodium (VOLTAREN) 1 % GEL Apply 2 g topically at bedtime. Apply to Bilateral shoulders     DULoxetine (CYMBALTA) 60 MG capsule Take 60 mg by mouth daily.     DULoxetine HCl 60 MG CSDR Take  1 capsule by mouth at bedtime.     finasteride (PROSCAR) 5 MG tablet Take 5 mg by mouth daily.     finasteride (PROSCAR) 5 MG tablet Take 5 mg by mouth daily.     gabapentin (NEURONTIN) 800 MG tablet Take 800 mg by mouth 2 (two) times daily.     hypromellose (SYSTANE OVERNIGHT THERAPY) 0.3 % GEL ophthalmic ointment Place 1 application. into both eyes at bedtime.     Infant Care Products [facility] EX) Apply 1 application. topically 3 (three) times daily. Apply to penile area     Infant Care Products (DERMACLOUD EX) Dermacloud     ipratropium (ATROVENT) 0.06 % nasal spray Place 2 sprays into both nostrils 3 (three) times daily as needed for rhinitis.     Lysine 1000 MG TABS Take 1 tablet by mouth every other day.     Lysine HCl 1000 MG TABS Take 1,000 mg by mouth every other day.     Magnesium 250 MG TABS Take 1 tablet by mouth daily.     MOVANTIK 25 MG TABS tablet Take by mouth.     mupirocin ointment (BACTROBAN) 2 % Apply 1 application. topically 2 (two) times daily.     naloxone (NARCAN) nasal spray 4 mg/0.1 mL Place into the nose.     ondansetron (ZOFRAN) 4 MG tablet Take 4 mg by mouth every 6 (six) hours as needed for nausea or vomiting.     oxyCODONE (OXY IR/ROXICODONE) 5 MG immediate release tablet Take 5 mg by mouth every 6 (six) hours as needed for severe pain.     polyethylene glycol (MIRALAX / GLYCOLAX) packet Take 17 g by mouth daily.     pregabalin (LYRICA) 200 MG capsule Take 200 mg by mouth 2 (two) times daily.     psyllium (HYDROCIL/METAMUCIL) 95 % PACK Take 1 packet by mouth daily.     senna-docusate (SENOKOT-S) 8.6-50 MG tablet Take 1 tablet by mouth as directed. Take 1 tablet every morning Take 1 tablet at bedtime on Monday, Wednesday and Friday.     tamsulosin (FLOMAX) 0.4 MG CAPS capsule Take 0.4 mg by mouth 1 day or 1 dose.     simethicone (MYLICON) 0000000 MG chewable tablet Chew 125 mg by mouth every 6 (six) hours as needed for flatulence. (Patient not taking: Reported on  03/21/2022)     tamsulosin (FLOMAX) 0.4 MG CAPS capsule Take 0.4 mg by mouth daily.  (Patient not taking: Reported on 03/21/2022)     triamcinolone cream (KENALOG) 0.1 %  Apply 1 application. topically 2 (two) times daily. (Patient not taking: Reported on 03/21/2022)     vitamin C (ASCORBIC ACID) 500 MG tablet Take 500 mg by mouth daily.  (Patient not taking: Reported on 03/21/2022)     No current facility-administered medications on file prior to visit.    There are no Patient Instructions on file for this visit. No follow-ups on file.   Kris Hartmann, NP

## 2022-03-25 LAB — VAS US ABI WITH/WO TBI
Left ABI: 1.08
Right ABI: 1.22

## 2022-03-26 ENCOUNTER — Encounter: Payer: Self-pay | Admitting: Ophthalmology

## 2022-03-28 NOTE — Discharge Instructions (Signed)

## 2022-04-02 ENCOUNTER — Ambulatory Visit: Payer: Medicare HMO | Admitting: Anesthesiology

## 2022-04-02 ENCOUNTER — Ambulatory Visit
Admission: RE | Admit: 2022-04-02 | Discharge: 2022-04-02 | Disposition: A | Discharge status: Home / Self Care | Payer: Medicare HMO | Source: Ambulatory Visit | Attending: Ophthalmology | Admitting: Ophthalmology

## 2022-04-02 ENCOUNTER — Encounter
Admission: RE | Disposition: A | Discharge status: Home / Self Care | Payer: Self-pay | Source: Ambulatory Visit | Attending: Ophthalmology

## 2022-04-02 ENCOUNTER — Encounter: Payer: Self-pay | Admitting: Ophthalmology

## 2022-04-02 ENCOUNTER — Other Ambulatory Visit: Payer: Self-pay

## 2022-04-02 DIAGNOSIS — F418 Other specified anxiety disorders: Secondary | ICD-10-CM | POA: Insufficient documentation

## 2022-04-02 DIAGNOSIS — I739 Peripheral vascular disease, unspecified: Secondary | ICD-10-CM | POA: Insufficient documentation

## 2022-04-02 DIAGNOSIS — M1990 Unspecified osteoarthritis, unspecified site: Secondary | ICD-10-CM | POA: Diagnosis not present

## 2022-04-02 DIAGNOSIS — Z993 Dependence on wheelchair: Secondary | ICD-10-CM | POA: Insufficient documentation

## 2022-04-02 DIAGNOSIS — G40909 Epilepsy, unspecified, not intractable, without status epilepticus: Secondary | ICD-10-CM | POA: Diagnosis not present

## 2022-04-02 DIAGNOSIS — Z09 Encounter for follow-up examination after completed treatment for conditions other than malignant neoplasm: Secondary | ICD-10-CM | POA: Diagnosis not present

## 2022-04-02 DIAGNOSIS — H2181 Floppy iris syndrome: Secondary | ICD-10-CM | POA: Insufficient documentation

## 2022-04-02 DIAGNOSIS — I10 Essential (primary) hypertension: Secondary | ICD-10-CM | POA: Diagnosis not present

## 2022-04-02 DIAGNOSIS — K219 Gastro-esophageal reflux disease without esophagitis: Secondary | ICD-10-CM | POA: Insufficient documentation

## 2022-04-02 DIAGNOSIS — H2512 Age-related nuclear cataract, left eye: Secondary | ICD-10-CM | POA: Diagnosis present

## 2022-04-02 DIAGNOSIS — Z8673 Personal history of transient ischemic attack (TIA), and cerebral infarction without residual deficits: Secondary | ICD-10-CM | POA: Insufficient documentation

## 2022-04-02 HISTORY — PX: CATARACT EXTRACTION W/PHACO: SHX586

## 2022-04-02 HISTORY — DX: Presence of other specified functional implants: Z96.89

## 2022-04-02 SURGERY — PHACOEMULSIFICATION, CATARACT, WITH IOL INSERTION
Anesthesia: Monitor Anesthesia Care | Site: Eye | Laterality: Left

## 2022-04-02 MED ORDER — MOXIFLOXACIN HCL 0.5 % OP SOLN
OPHTHALMIC | Status: DC | PRN
  Administered 2022-04-02: .2 mL via OPHTHALMIC

## 2022-04-02 MED ORDER — LACTATED RINGERS IV SOLN: INTRAVENOUS | Status: DC

## 2022-04-02 MED ORDER — ARMC OPHTHALMIC DILATING DROPS
1.0000 | OPHTHALMIC | Status: DC | PRN
  Administered 2022-04-02 (×3): 1 via OPHTHALMIC

## 2022-04-02 MED ORDER — SIGHTPATH DOSE#1 BSS IO SOLN
INTRAOCULAR | Status: DC | PRN
  Administered 2022-04-02: 15 mL

## 2022-04-02 MED ORDER — TETRACAINE HCL 0.5 % OP SOLN
1.0000 [drp] | OPHTHALMIC | Status: DC | PRN
  Administered 2022-04-02 (×3): 1 [drp] via OPHTHALMIC

## 2022-04-02 MED ORDER — SIGHTPATH DOSE#1 BSS IO SOLN
INTRAOCULAR | Status: DC | PRN
  Administered 2022-04-02: 88 mL via OPHTHALMIC

## 2022-04-02 MED ORDER — LIDOCAINE HCL (PF) 2 % IJ SOLN
INTRAOCULAR | Status: DC | PRN
  Administered 2022-04-02: 1 mL via INTRAOCULAR

## 2022-04-02 MED ORDER — SIGHTPATH DOSE#1 NA HYALUR & NA CHOND-NA HYALUR IO KIT
PACK | INTRAOCULAR | Status: DC | PRN
  Administered 2022-04-02: 1 via OPHTHALMIC

## 2022-04-02 MED ORDER — FENTANYL CITRATE (PF) 100 MCG/2ML IJ SOLN
INTRAMUSCULAR | Status: DC | PRN
  Administered 2022-04-02: 50 ug via INTRAVENOUS

## 2022-04-02 SURGICAL SUPPLY — 14 items
CATARACT SUITE SIGHTPATH (MISCELLANEOUS) ×1 IMPLANT
DISSECTOR HYDRO NUCLEUS 50X22 (MISCELLANEOUS) ×1 IMPLANT
FEE CATARACT SUITE SIGHTPATH (MISCELLANEOUS) ×1 IMPLANT
GLOVE SURG GAMMEX PI TX LF 7.5 (GLOVE) ×1 IMPLANT
GLOVE SURG SYN 8.5  E (GLOVE) ×1
GLOVE SURG SYN 8.5 E (GLOVE) ×1 IMPLANT
GLOVE SURG SYN 8.5 PF PI (GLOVE) ×1 IMPLANT
LENS IOL TECNIS EYHANCE 15.5 (Intraocular Lens) IMPLANT
NDL FILTER BLUNT 18X1 1/2 (NEEDLE) ×1 IMPLANT
NEEDLE FILTER BLUNT 18X1 1/2 (NEEDLE) ×1 IMPLANT
RING MALYGIN (MISCELLANEOUS) IMPLANT
SYR 3ML LL SCALE MARK (SYRINGE) ×1 IMPLANT
SYR 5ML LL (SYRINGE) ×1 IMPLANT
WATER STERILE IRR 250ML POUR (IV SOLUTION) ×1 IMPLANT

## 2022-04-02 NOTE — Op Note (Signed)
OPERATIVE NOTE  Dustin Turner [id number] 04/02/2022   PREOPERATIVE DIAGNOSIS:  Nuclear sclerotic cataract left eye.  H25.12   POSTOPERATIVE DIAGNOSIS:      Nuclear sclerotic cataract left eye.   Intraoperative floppy iris syndrome, left eye.   PROCEDURE:   CPT [phone] Complex phacoemusification with posterior chamber intraocular lens placement of the left eye, requiring placement of a Malyugin ring for dilation and stabilization of the iris  LENS:   Implant Name Type Inv. Item Serial No. Manufacturer Lot No. LRB No. Used Action  LENS IOL TECNIS EYHANCE 15.5 - [id number] Intraocular Lens LENS IOL TECNIS EYHANCE 15.5 [id number] S[employer]  Left 1 Implanted      Procedure(s) with comments: CATARACT EXTRACTION PHACO AND INTRAOCULAR LENS PLACEMENT ([facility]) LEFT  5.20  00:35.8 (Left) - sleep apnea  DIB00 +15.5   ULTRASOUND TIME: 0 minutes 35 seconds.  CDE 5.20   SURGEON:  Benay Pillow, MD, MPH   ANESTHESIA:  Topical with tetracaine drops augmented with 1% preservative-free intracameral lidocaine.  ESTIMATED BLOOD LOSS: <1 mL   COMPLICATIONS:  None.   DESCRIPTION OF PROCEDURE:  The patient was identified in the holding room and transported to the operating room and placed in the supine position under the operating microscope.  The left eye was identified as the operative eye and it was prepped and draped in the usual sterile ophthalmic fashion.   A 1.0 millimeter clear-corneal paracentesis was made at the 5:00 position. 0.5 ml of preservative-free 1% lidocaine with epinephrine was injected into the anterior chamber.  The anterior chamber was filled with viscoelastic.  A 2.4 millimeter keratome was used to make a near-clear corneal incision at the 2:00 position.    The pupil was small, so a Malyugin ring was placed without difficulty.  A curvilinear capsulorrhexis was made with a cystotome and capsulorrhexis forceps.  Balanced salt solution was used to hydrodissect and hydrodelineate the  nucleus.   Phacoemulsification was then used in stop and chop fashion to remove the lens nucleus and epinucleus.  The remaining cortex was then removed using the irrigation and aspiration handpiece. Viscoelastic was then placed into the capsular bag to distend it for lens placement.  A lens was then injected into the capsular bag.    The Malyugin ring was removed.  The remaining viscoelastic was aspirated.   Wounds were hydrated with balanced salt solution.  The anterior chamber was inflated to a physiologic pressure with balanced salt solution.  Intracameral vigamox 0.1 mL undiltued was injected into the eye and a drop placed onto the ocular surface.  No wound leaks were noted.  The patient was taken to the recovery room in stable condition without complications of anesthesia or surgery  Benay Pillow 04/02/2022, 1:43 PM
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
## 2022-04-02 NOTE — Anesthesia Postprocedure Evaluation (Signed)
Anesthesia Post Note  Patient: LEWELLYN PINAL  Procedure(s) Performed: CATARACT EXTRACTION PHACO AND INTRAOCULAR LENS PLACEMENT (IOC) LEFT  5.20  00:35.8 (Left: Eye)  Patient location during evaluation: PACU Anesthesia Type: MAC Level of consciousness: awake and alert Pain management: pain level controlled Vital Signs Assessment: post-procedure vital signs reviewed and stable Respiratory status: spontaneous breathing, nonlabored ventilation and respiratory function stable Cardiovascular status: blood pressure returned to baseline and stable Postop Assessment: no apparent nausea or vomiting Anesthetic complications: no   No notable events documented.   Last Vitals:  Vitals:   04/02/22 1345  BP: 135/83  Pulse: 63  Resp: (!) 21  Temp: 36.8 C  SpO2: 100%    Last Pain:  Vitals:   04/02/22 1345  PainSc: 0-No pain                 Iran Ouch

## 2022-04-02 NOTE — Transfer of Care (Signed)
Immediate Anesthesia Transfer of Care Note  Patient: Dustin Turner  Procedure(s) Performed: CATARACT EXTRACTION PHACO AND INTRAOCULAR LENS PLACEMENT (IOC) LEFT  5.20  00:35.8 (Left: Eye)  Patient Location: PACU  Anesthesia Type: MAC  Level of Consciousness: awake, alert  and patient cooperative  Airway and Oxygen Therapy: Patient Spontanous Breathing and Patient connected to supplemental oxygen  Post-op Assessment: Post-op Vital signs reviewed, Patient's Cardiovascular Status Stable, Respiratory Function Stable, Patent Airway and No signs of Nausea or vomiting  Post-op Vital Signs: Reviewed and stable  Complications: No notable events documented.

## 2022-04-02 NOTE — Anesthesia Preprocedure Evaluation (Addendum)
Anesthesia Evaluation  Patient identified by MRN, date of birth, ID band Patient awake    Airway Mallampati: II  TM Distance: >3 FB Neck ROM: Full    Dental no notable dental hx.    Pulmonary sleep apnea    Pulmonary exam normal        Cardiovascular hypertension, Normal cardiovascular exam  ECHO 2018: - Left ventricle: The cavity size was normal. There was mild    concentric hypertrophy. Systolic function was vigorous. The    estimated ejection fraction was in the range of 65% to 70%. Wall    motion was normal; there were no regional wall motion    abnormalities. The study is not technically sufficient to allow    evaluation of LV diastolic function.  - Pulmonary arteries: Systolic pressure was moderately increased.    PA peak pressure: 50 mm Hg (S).  Impressions:  - No cardiac source of emboli was indentified.     Neuro/Psych Seizures -, Well Controlled,   Anxiety Depression    Mild cognitive dysfunction Neuromuscular disease (Peripheral neuropathy) CVA, No Residual Symptoms    GI/Hepatic Neg liver ROS, hiatal hernia,GERD  Controlled,,  Endo/Other  negative endocrine ROS    Renal/GU negative Renal ROS Bladder dysfunction      Musculoskeletal  (+) Arthritis , Osteoarthritis,  Uses wheelchair   Abdominal Normal abdominal exam  (+)   Peds  Hematology negative hematology ROS (+)   Anesthesia Other Findings   Reproductive/Obstetrics                             Anesthesia Physical Anesthesia Plan  ASA: 3  Anesthesia Plan: MAC   Post-op Pain Management:    Induction: Intravenous  PONV Risk Score and Plan: 1 and TIVA, Treatment may vary due to age or medical condition and Midazolam  Airway Management Planned: Natural Airway and Nasal Cannula  Additional Equipment:   Intra-op Plan:   Post-operative Plan:   Informed Consent: I have reviewed the patients History and Physical,  chart, labs and discussed the procedure including the risks, benefits and alternatives for the proposed anesthesia with the patient or authorized representative who has indicated his/her understanding and acceptance.     Dental advisory given  Plan Discussed with: CRNA  Anesthesia Plan Comments:         Anesthesia Quick Evaluation

## 2022-04-03 ENCOUNTER — Encounter: Payer: Self-pay | Admitting: Ophthalmology

## 2022-06-15 NOTE — Progress Notes (Signed)
[facility] [address] [place], [place] [address]  Pulmonary Sleep Medicine   Office Visit Note  Patient Name: Dustin Turner DOB: Apr 18, 1935 MRN [id number]    Chief Complaint: Obstructive Sleep Apnea visit  Brief History:  Dustin Turner is seen today for for a follow up visit for APAP@ 6-13 cmH2O. The patient has a 7 year history of sleep apnea. Patient is using PAP nightly.  The patient feels better after sleeping with PAP.  The patient reports benefit from PAP use. Reported sleepiness is  improved and the Epworth Sleepiness Score is 5 out of 24. The patient does not  take naps. The patient complains of the following: mask leak.   The compliance download shows 96% compliance with an average use time of 8 hours 7 minutes. The AHI is 11.1  The patient does not complain of limb movements disrupting sleep. The patient continues to require PAP therapy in order to eliminate sleep apnea. We had discussed his sleepiness in the daytime previously, but the patient reports in the last two weeks it has improved significantly.   ROS  General: (-) fever, (-) chills, (-) night sweat Nose and Sinuses: (-) nasal stuffiness or itchiness, (-) postnasal drip, (-) nosebleeds, (-) sinus trouble. Mouth and Throat: (-) sore throat, (-) hoarseness. Neck: (-) swollen glands, (-) enlarged thyroid, (-) neck pain. Respiratory: - cough, - shortness of breath, - wheezing. Neurologic: - numbness, - tingling. Psychiatric: - anxiety, - depression   Current Medication: Outpatient Encounter Medications as of 06/18/2022  Medication Sig   DULoxetine (CYMBALTA) 60 MG capsule Take 60 mg by mouth daily.   acetaminophen (TYLENOL) 500 MG tablet Take 1,000 mg by mouth every 6 (six) hours as needed.   alendronate (FOSAMAX) 70 MG tablet Take 70 mg by mouth once a week. On Thursday   alum & mag hydroxide-simeth (MAALOX PLUS) 400-400-40 MG/5ML suspension Take 15 mLs by mouth every 4 (four) hours as needed. Give 30 ml by  mouth daily with meals as needed   ammonium lactate (LAC-HYDRIN) 12 % lotion Apply 1 application topically 2 (two) times daily as needed.    Artificial Saliva (BIOTENE ORALBALANCE DRY MOUTH) GEL Use as directed 1 application in the mouth or throat daily as needed. Apply a pea size amount to mucous membrane for dry mouth   calcium-vitamin D (OSCAL WITH D) 500-200 MG-UNIT tablet Take 1 tablet by mouth 2 (two) times daily.    capsicum (ZOSTRIX) 0.075 % topical cream Apply 1 application. topically 3 (three) times daily.   carbamide peroxide (DEBROX) 6.5 % OTIC solution 5 drops 2 (two) times daily.   carbamide peroxide (GLY-OXIDE) 10 % solution Place 1 Application onto teeth daily.   diclofenac Sodium (VOLTAREN ARTHRITIS PAIN) 1 % GEL Apply 2 g topically 1 day or 1 dose.   DULoxetine HCl 60 MG CSDR Take 1 capsule by mouth at bedtime.   finasteride (PROSCAR) 5 MG tablet Take 5 mg by mouth daily.   gabapentin (NEURONTIN) 800 MG tablet Take 800 mg by mouth daily.   guaiFENesin (ROBITUSSIN) 100 MG/5ML liquid Take 10 mLs by mouth every 6 (six) hours as needed for cough or to loosen phlegm.   hypromellose (SYSTANE OVERNIGHT THERAPY) 0.3 % GEL ophthalmic ointment Place 1 application. into both eyes at bedtime.   Infant Care Products [facility] EX) Apply 1 application. topically 3 (three) times daily. Apply to penile area   ipratropium (ATROVENT) 0.06 % nasal spray Place 2 sprays into both nostrils 3 (three) times  daily as needed for rhinitis.   Lysine 1000 MG TABS Take 1 tablet by mouth every other day.   Magnesium 250 MG TABS Take 1 tablet by mouth daily.   MOVANTIK 25 MG TABS tablet Take by mouth.   mupirocin ointment (BACTROBAN) 2 % Apply 1 application. topically 2 (two) times daily.   naloxone (NARCAN) nasal spray 4 mg/0.1 mL Place into the nose.   ondansetron (ZOFRAN) 4 MG tablet Take 4 mg by mouth every 6 (six) hours as needed for nausea or vomiting.   oxyCODONE (OXY IR/ROXICODONE) 5 MG immediate  release tablet Take 5 mg by mouth every 12 (twelve) hours as needed for severe pain.   polyethylene glycol (MIRALAX / GLYCOLAX) packet Take 17 g by mouth daily.   pregabalin (LYRICA) 200 MG capsule Take 200 mg by mouth 2 (two) times daily.   psyllium (HYDROCIL/METAMUCIL) 95 % PACK Take 1 packet by mouth daily.   senna-docusate (SENOKOT-S) 8.6-50 MG tablet Take 1 tablet by mouth as directed. Take 1 tablet every morning Take 1 tablet at bedtime on Monday, Wednesday and Friday.   simethicone (MYLICON) 125 MG chewable tablet Chew 125 mg by mouth every 6 (six) hours as needed for flatulence.   tamsulosin (FLOMAX) 0.4 MG CAPS capsule Take 0.4 mg by mouth daily.   triamcinolone cream (KENALOG) 0.1 % Apply 1 application  topically 2 (two) times daily.   vitamin C (ASCORBIC ACID) 500 MG tablet Take 500 mg by mouth daily.   No facility-administered encounter medications on file as of 06/18/2022.    Surgical History: Past Surgical History:  Procedure Laterality Date   CARPAL TUNNEL RELEASE Left 03/16/2016   Dr. Kennedy Bucker   CATARACT EXTRACTION W/PHACO Right 01/04/2020   Procedure: CATARACT EXTRACTION PHACO AND INTRAOCULAR LENS PLACEMENT (IOC) RIGHT;  Surgeon: Nevada Crane, MD;  Location: [facility];  Service: Ophthalmology;  Laterality: Right;  4.77 0:34.4   CATARACT EXTRACTION W/PHACO Left 04/02/2022   Procedure: CATARACT EXTRACTION PHACO AND INTRAOCULAR LENS PLACEMENT (IOC) LEFT  5.20  00:35.8;  Surgeon: Nevada Crane, MD;  Location: [facility];  Service: Ophthalmology;  Laterality: Left;  sleep apnea   CERVICAL SPINE SURGERY     CORNEAL EYE SURGERY Right 09/08/2014   PTK (Dr. Chales Abrahams)   HEMORRHOID SURGERY     HERNIA REPAIR     left side   INTRATHECAL PUMP IMPLANTATION  04/28/2014   Procedure: REVISION INTRATHECAL CATH; Surgeon: Burman Blacksmith, MD; Location: [facility] OR; Service: Anes/ Pain Mgmt; Laterality: N/A;   JOINT REPLACEMENT Bilateral    bilateral knee  replacements   LUMBAR SPINE SURGERY     PAIN PUMP IMPLANTATION     multiple procedures for removal and insertion this is his 3rd pain pump   PAIN PUMP REMOVAL     multiple procedures for removal and insertion this is his 3rd pain pump   Proleive System Treatment  2006   ROTATOR CUFF REPAIR Right    x2   SPINAL CORD STIMULATOR IMPLANT     x2   THORACIC SPINE SURGERY     TONSILLECTOMY      Medical History: Past Medical History:  Diagnosis Date   Abdominal pain    other specified site   Abnormal gait    Advance directive in chart 10/12/2015   Anemia    Arthritis    BPH (benign prostatic hyperplasia)    Cataract    Cervical spondylosis without myelopathy    Cognitive dysfunction  DDD (degenerative disc disease), cervical 06/29/2013   Degeneration of cervical intervertebral disc    Degeneration of lumbar intervertebral disc    Depression    Excessive daytime sleepiness    Generalized osteoarthritis of multiple sites 11/19/2013   GERD (gastroesophageal reflux disease)    Hallux varus, acquired    Hiatal hernia    Hyperlipidemia    Hypertension    (12/30/19 - pt denies)   Hypogonadism in male    Impaired functional mobility, balance, gait, and endurance 11/14/2015   uses motorized wheelchair   Insomnia 06/29/2013   Low back pain    Lumbosacral spondylosis without myelopathy    Memory loss    Mild cognitive impairment    Neurogenic bladder    OP (osteoporosis)    Organic sleep apnea    Osteoporosis 03/15/2004   Peripheral neuropathy    Radial nerve palsy    Scoliosis 06/29/2013   Seizure (HCC) 07/31/2016   Sleep apnea    CPAP to bring DOS sleep study done in [place]   Spinal compression fracture (HCC)    Spinal cord stimulator status    No longer functions, "battery died"   Spinal stenosis    Spondylolisthesis, acquired    Urinary incontinence, stress, male 08/10/2015   Urinary urgency    Uses hearing aid 10/12/2015   both ears   Uses wheelchair     Venous stasis    Venous stasis dermatitis of both lower extremities 06/29/2013   Vision abnormalities    Vitamin B12 deficiency 09/27/2016   Wrist drop, bilateral     Family History: Non contributory to the present illness  Social History: Social History   Socioeconomic History   Marital status: Married    Spouse name: Not on file   Number of children: Not on file   Years of education: Not on file   Highest education level: Not on file  Occupational History   Not on file  Tobacco Use   Smoking status: Never   Smokeless tobacco: Never  Vaping Use   Vaping Use: Never used  Substance and Sexual Activity   Alcohol use: No    Alcohol/week: 0.0 standard drinks of alcohol    Comment: history of alcohol addiction, attends regular AA meetings   Drug use: No   Sexual activity: Not on file  Other Topics Concern   Not on file  Social History Narrative   Not on file   Social Determinants of Health   Financial Resource Strain: Not on file  Food Insecurity: Not on file  Transportation Needs: Not on file  Physical Activity: Not on file  Stress: Not on file  Social Connections: Not on file  Intimate Partner Violence: Not on file    Vital Signs: There were no vitals taken for this visit. There is no height or weight on file to calculate BMI.    Examination: General Appearance: The patient is well-developed, well-nourished, and in no distress. Neck Circumference: 38 cm Skin: Gross inspection of skin unremarkable. Head: normocephalic, no gross deformities. Eyes: no gross deformities noted. ENT: ears appear grossly normal Neurologic: Alert and oriented. No involuntary movements.  STOP BANG RISK ASSESSMENT S (snore) Have you been told that you snore?     YES/NO   T (tired) Are you often tired, fatigued, or sleepy during the day?   YES/NO  O (obstruction) Do you stop breathing, choke, or gasp during sleep? YES/NO   P (pressure) Do you have or are you being treated for  high blood pressure? YES/NO   B (BMI) Is your body index greater than 35 kg/m? YES/NO   A (age) Are you 87 years old or older? YES/NO   N (neck) Do you have a neck circumference greater than 16 inches?   YES/NO   G (gender) Are you a male? YES/NO   TOTAL STOP/BANG "YES" ANSWERS        A STOP-Bang score of 2 or less is considered low risk, and a score of 5 or more is high risk for having either moderate or severe OSA. For people who score 3 or 4, doctors may need to perform further assessment to determine how likely they are to have OSA.         EPWORTH SLEEPINESS SCALE:  Scale:  (0)= no chance of dozing; (1)= slight chance of dozing; (2)= moderate chance of dozing; (3)= high chance of dozing  Chance  Situtation    Sitting and reading: 2    Watching TV: 0    Sitting Inactive in public: 0    As a passenger in car: 0      Lying down to rest: 0    Sitting and talking: 0    Sitting quielty after lunch: 3    In a car, stopped in traffic: 0   TOTAL SCORE:   5 out of 24    SLEEP STUDIES:  PSG (04/2015) AHI 7.4/hr, 67% central, min SpO2 87%, recommend BiPAP titration Titration (05/2016) recommended BiPAP ST@ 17/11 cmH2O, back up RR 10 bpm Titration (11/2021) recommended CPAP@ 10 cmH2O   CPAP COMPLIANCE DATA:  Date Range: 03/12/2022-06/13/2022  Average Daily Use: 8 hours 7 minutes  Median Use: 8 hours 25 minutes  Compliance for > 4 Hours: 96%  AHI: 11.1 respiratory events per hour  Days Used: 94/94 days  Mask Leak: 70.8  95th Percentile Pressure: 11.4         LABS: Recent Results (from the past 2160 hour(s))  VAS [place] ABI WITH/WO TBI     Status: None   Collection Time: 03/21/22  2:20 PM  Result Value Ref Range   Right ABI 1.22    Left ABI 1.08     Radiology: No results found.  No results found.  No results found.    Assessment and Plan: Patient Active Problem List   Diagnosis Date Noted   Excessive daytime sleepiness 06/18/2022    Complex sleep apnea syndrome 10/23/2021   E coli bacteremia 06/22/2021   Severe sepsis (HCC) 06/21/2021   AKI (acute kidney injury) (HCC) 06/21/2021   UTI (urinary tract infection) due to urinary indwelling Foley catheter (HCC) 06/21/2021   OSA on CPAP 05/23/2020   CPAP use counseling 05/23/2020   Chronic pain syndrome 10/26/2017   Presence of intrathecal pump 06/27/2017   OAB (overactive bladder) 04/05/2017   Dural venous sinus thrombosis 03/15/2017   Cerebellar stroke (HCC) 01/01/2017   Knee pain, bilateral 08/31/2016   Seizures (HCC) 07/31/2016   Chronic venous insufficiency 04/09/2016   Impaired functional mobility, balance, gait, and endurance 11/14/2015   Hypogonadism in male 05/27/2015   BPH with obstruction/lower urinary tract symptoms 04/15/2015   Anemia 07/23/2014   Narrowing of intervertebral disc space 07/23/2014   Depression with anxiety 07/23/2014   GERD without esophagitis 07/23/2014   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   Scoliosis 07/23/2014   Compressed spine fracture (HCC) 07/23/2014   Spinal stenosis 07/23/2014   Obstructive apnea 12/08/2013   Generalized OA 11/19/2013   Idiopathic scoliosis and kyphoscoliosis 08/20/2013  Osteoporosis 07/30/2013   Dermatitis, stasis 06/29/2013   Curvature of spine 10/10/2012   Acquired deformities of spine 10/10/2012   Cervical spondylosis without myelopathy 05/03/2011   Cervical osteoarthritis 05/03/2011   DDD (degenerative disc disease), lumbosacral 05/03/2011   Angulation of spine 05/03/2011   Degenerative arthritis of lumbar spine 05/03/2011   Lumbosacral spondylosis 05/03/2011   Back pain, chronic 03/29/2011   Benign fibroma of prostate 03/29/2011   Arthritis 08/07/2010   Constipation due to opioid therapy 08/07/2010   1. Complex sleep apnea syndrome  The patient does tolerate PAP and reports  benefit from PAP use. The patient was reminded how to clean equipment and advised to replace supplies more frequently. He  has significant mask leak that is causing a higher AHI. He did recently change his mask and seal. We will do a download in 2 weeks and if no improvement he will need a mask fitting. He is agreeable. He gets his supplies at [employer] normally. . The compliance is excellent. The AHI is 11,1.   OSA on cpap- not optimally controlled due to mask leak. Download 2 weeks. If no improvement, mask fitting. F/u 53m telemed. CPAP continues to be medically necessary to treat this patient's OSA.   2. CPAP use counseling CPAP Counseling: had a lengthy discussion with the patient regarding the importance of PAP therapy in management of the sleep apnea. Patient appears to understand the risk factor reduction and also understands the risks associated with untreated sleep apnea. Patient will try to make a good faith effort to remain compliant with therapy. Also instructed the patient on proper cleaning of the device including the water must be changed daily if possible and use of distilled water is preferred. Patient understands that the machine should be regularly cleaned with appropriate recommended cleaning solutions that do not damage the PAP machine for example given white vinegar and water rinses. Other methods such as ozone treatment may not be as good as these simple methods to achieve cleaning.   3. Excessive daytime sleepiness Pt states he dozes off very infrequently. He is on multiple medications that can cause sleepiness, but he is stating symptoms have improved. He is not a candidate for stimulants due to his history of seizures. F/u 74m     General Counseling: I have discussed the findings of the evaluation and examination with Abdulhadi.  I have also discussed any further diagnostic evaluation thatmay be needed or ordered today. Haley verbalizes understanding of the findings of todays visit. We also reviewed his medications today and discussed drug interactions and side effects including but not limited  excessive drowsiness and altered mental states. We also discussed that there is always a risk not just to him but also people around him. he has been encouraged to call the office with any questions or concerns that should arise related to todays visit.  No orders of the defined types were placed in this encounter.       I have personally obtained a history, examined the patient, evaluated laboratory and imaging results, formulated the assessment and plan and placed orders. This patient was seen today by Emmaline Kluver, PA-C in collaboration with Dr. Freda Munro.   Yevonne Pax, MD [facility] Diplomate ABMS Pulmonary Critical Care Medicine and Sleep Medicine

## 2022-06-18 ENCOUNTER — Ambulatory Visit (INDEPENDENT_AMBULATORY_CARE_PROVIDER_SITE_OTHER): Payer: Medicare HMO | Admitting: Internal Medicine

## 2022-06-18 DIAGNOSIS — G4719 Other hypersomnia: Secondary | ICD-10-CM | POA: Diagnosis not present

## 2022-06-18 DIAGNOSIS — Z7189 Other specified counseling: Secondary | ICD-10-CM

## 2022-06-18 DIAGNOSIS — G4731 Primary central sleep apnea: Secondary | ICD-10-CM

## 2022-06-18 NOTE — Patient Instructions (Signed)

## 2022-09-24 ENCOUNTER — Ambulatory Visit: Payer: Medicare HMO | Admitting: Internal Medicine

## 2022-09-24 NOTE — Progress Notes (Unsigned)
Patient was no-show for appointment.  The office staff will contact the patient for rescheduling follow-up.

## 2022-09-24 NOTE — Patient Instructions (Signed)

## 2023-01-11 NOTE — Progress Notes (Unsigned)
[facility] [address] [place], [place] [address]  Pulmonary Sleep Medicine   Office Visit Note  Patient Name: Dustin Turner DOB: [birth year] MRN [id number]    Chief Complaint: Obstructive Sleep Apnea visit  Brief History:  Dustin Turner is seen today for a follow up visit for APAP@ 6-13 cmH2O The patient has a 7 year history of sleep apnea. Patient is using PAP nightly.  The patient feels rested after sleeping with PAP.  The patient reports benefiting from PAP use. Reported sleepiness is  improved and the Epworth Sleepiness Score is *** out of 24. The patient *** take naps. The patient complains of the following: ***  The compliance download shows 98% compliance with an average use time of 7 hours 57 minutes. The AHI is 13.3.  The patient *** of limb movements disrupting sleep. The patient continues to require PAP therapy in order to eliminate sleep apnea.   ROS  General: (-) fever, (-) chills, (-) night sweat Nose and Sinuses: (-) nasal stuffiness or itchiness, (-) postnasal drip, (-) nosebleeds, (-) sinus trouble. Mouth and Throat: (-) sore throat, (-) hoarseness. Neck: (-) swollen glands, (-) enlarged thyroid, (-) neck pain. Respiratory: *** cough, *** shortness of breath, *** wheezing. Neurologic: *** numbness, *** tingling. Psychiatric: *** anxiety, *** depression   Current Medication: Outpatient Encounter Medications as of 01/14/2023  Medication Sig   acetaminophen (TYLENOL) 500 MG tablet Take 1,000 mg by mouth every 6 (six) hours as needed.   alendronate (FOSAMAX) 70 MG tablet Take 70 mg by mouth once a week. On Thursday   alum & mag hydroxide-simeth (MAALOX PLUS) 400-400-40 MG/5ML suspension Take 15 mLs by mouth every 4 (four) hours as needed. Give 30 ml by mouth daily with meals as needed   ammonium lactate (LAC-HYDRIN) 12 % lotion Apply 1 application topically 2 (two) times daily as needed.    Artificial Saliva (BIOTENE ORALBALANCE DRY MOUTH) GEL Use as directed 1  application in the mouth or throat daily as needed. Apply a pea size amount to mucous membrane for dry mouth   calcium-vitamin D (OSCAL WITH D) 500-200 MG-UNIT tablet Take 1 tablet by mouth 2 (two) times daily.    capsicum (ZOSTRIX) 0.075 % topical cream Apply 1 application. topically 3 (three) times daily.   carbamide peroxide (DEBROX) 6.5 % OTIC solution 5 drops 2 (two) times daily.   carbamide peroxide (GLY-OXIDE) 10 % solution Place 1 Application onto teeth daily.   diclofenac Sodium (VOLTAREN ARTHRITIS PAIN) 1 % GEL Apply 2 g topically 1 day or 1 dose.   DULoxetine (CYMBALTA) 60 MG capsule Take 60 mg by mouth daily.   DULoxetine HCl 60 MG CSDR Take 1 capsule by mouth at bedtime.   finasteride (PROSCAR) 5 MG tablet Take 5 mg by mouth daily.   gabapentin (NEURONTIN) 800 MG tablet Take 800 mg by mouth daily.   guaiFENesin (ROBITUSSIN) 100 MG/5ML liquid Take 10 mLs by mouth every 6 (six) hours as needed for cough or to loosen phlegm.   hypromellose (SYSTANE OVERNIGHT THERAPY) 0.3 % GEL ophthalmic ointment Place 1 application. into both eyes at bedtime.   Infant Care Products [facility] EX) Apply 1 application. topically 3 (three) times daily. Apply to penile area   ipratropium (ATROVENT) 0.06 % nasal spray Place 2 sprays into both nostrils 3 (three) times daily as needed for rhinitis.   Lysine 1000 MG TABS Take 1 tablet by mouth every other day.   Magnesium 250 MG TABS Take 1 tablet by  mouth daily.   MOVANTIK 25 MG TABS tablet Take by mouth.   mupirocin ointment (BACTROBAN) 2 % Apply 1 application. topically 2 (two) times daily.   naloxone (NARCAN) nasal spray 4 mg/0.1 mL Place into the nose.   ondansetron (ZOFRAN) 4 MG tablet Take 4 mg by mouth every 6 (six) hours as needed for nausea or vomiting.   oxyCODONE (OXY IR/ROXICODONE) 5 MG immediate release tablet Take 5 mg by mouth every 12 (twelve) hours as needed for severe pain.   polyethylene glycol (MIRALAX / GLYCOLAX) packet Take 17 g by  mouth daily.   pregabalin (LYRICA) 200 MG capsule Take 200 mg by mouth 2 (two) times daily.   psyllium (HYDROCIL/METAMUCIL) 95 % PACK Take 1 packet by mouth daily.   senna-docusate (SENOKOT-S) 8.6-50 MG tablet Take 1 tablet by mouth as directed. Take 1 tablet every morning Take 1 tablet at bedtime on Monday, Wednesday and Friday.   simethicone (MYLICON) 125 MG chewable tablet Chew 125 mg by mouth every 6 (six) hours as needed for flatulence.   tamsulosin (FLOMAX) 0.4 MG CAPS capsule Take 0.4 mg by mouth daily.   triamcinolone cream (KENALOG) 0.1 % Apply 1 application  topically 2 (two) times daily.   vitamin C (ASCORBIC ACID) 500 MG tablet Take 500 mg by mouth daily.   No facility-administered encounter medications on file as of 01/14/2023.    Surgical History: Past Surgical History:  Procedure Laterality Date   CARPAL TUNNEL RELEASE Left 03/16/2016   Dr. Kennedy Bucker   CATARACT EXTRACTION W/PHACO Right 01/04/2020   Procedure: CATARACT EXTRACTION PHACO AND INTRAOCULAR LENS PLACEMENT (IOC) RIGHT;  Surgeon: Nevada Crane, MD;  Location: [facility];  Service: Ophthalmology;  Laterality: Right;  4.77 0:34.4   CATARACT EXTRACTION W/PHACO Left 04/02/2022   Procedure: CATARACT EXTRACTION PHACO AND INTRAOCULAR LENS PLACEMENT (IOC) LEFT  5.20  00:35.8;  Surgeon: Nevada Crane, MD;  Location: [facility];  Service: Ophthalmology;  Laterality: Left;  sleep apnea   CERVICAL SPINE SURGERY     CORNEAL EYE SURGERY Right 09/08/2014   PTK (Dr. Chales Abrahams)   HEMORRHOID SURGERY     HERNIA REPAIR     left side   INTRATHECAL PUMP IMPLANTATION  04/28/2014   Procedure: REVISION INTRATHECAL CATH; Surgeon: Burman Blacksmith, MD; Location: [facility]; Service: Anes/ Pain Mgmt; Laterality: N/A;   JOINT REPLACEMENT Bilateral    bilateral knee replacements   LUMBAR SPINE SURGERY     PAIN PUMP IMPLANTATION     multiple procedures for removal and insertion this is his 3rd pain pump    PAIN PUMP REMOVAL     multiple procedures for removal and insertion this is his 3rd pain pump   Proleive System Treatment  2006   ROTATOR CUFF REPAIR Right    x2   SPINAL CORD STIMULATOR IMPLANT     x2   THORACIC SPINE SURGERY     TONSILLECTOMY      Medical History: Past Medical History:  Diagnosis Date   Abdominal pain    other specified site   Abnormal gait    Advance directive in chart 10/12/2015   Anemia    Arthritis    BPH (benign prostatic hyperplasia)    Cataract    Cervical spondylosis without myelopathy    Cognitive dysfunction    DDD (degenerative disc disease), cervical 06/29/2013   Degeneration of cervical intervertebral disc    Degeneration of lumbar intervertebral disc    Depression    Excessive  daytime sleepiness    Generalized osteoarthritis of multiple sites 11/19/2013   GERD (gastroesophageal reflux disease)    Hallux varus, acquired    Hiatal hernia    Hyperlipidemia    Hypertension    (12/30/19 - pt denies)   Hypogonadism in male    Impaired functional mobility, balance, gait, and endurance 11/14/2015   uses motorized wheelchair   Insomnia 06/29/2013   Low back pain    Lumbosacral spondylosis without myelopathy    Memory loss    Mild cognitive impairment    Neurogenic bladder    OP (osteoporosis)    Organic sleep apnea    Osteoporosis 03/15/2004   Peripheral neuropathy    Radial nerve palsy    Scoliosis 06/29/2013   Seizure (HCC) 07/31/2016   Sleep apnea    CPAP to bring DOS sleep study done in [place]   Spinal compression fracture (HCC)    Spinal cord stimulator status    No longer functions, "battery died"   Spinal stenosis    Spondylolisthesis, acquired    Urinary incontinence, stress, male 08/10/2015   Urinary urgency    Uses hearing aid 10/12/2015   both ears   Uses wheelchair    Venous stasis    Venous stasis dermatitis of both lower extremities 06/29/2013   Vision abnormalities    Vitamin B12 deficiency 09/27/2016    Wrist drop, bilateral     Family History: Non contributory to the present illness  Social History: Social History   Socioeconomic History   Marital status: Married    Spouse name: Not on file   Number of children: Not on file   Years of education: Not on file   Highest education level: Not on file  Occupational History   Not on file  Tobacco Use   Smoking status: Never   Smokeless tobacco: Never  Vaping Use   Vaping status: Never Used  Substance and Sexual Activity   Alcohol use: No    Alcohol/week: 0.0 standard drinks of alcohol    Comment: history of alcohol addiction, attends regular AA meetings   Drug use: No   Sexual activity: Not on file  Other Topics Concern   Not on file  Social History Narrative   Not on file   Social Drivers of Health   Financial Resource Strain: Low Risk  (03/06/2021)   [facility], [facility]   Overall Financial Resource Strain (CARDIA)    Difficulty of Paying Living Expenses: Not hard at all  Food Insecurity: No Food Insecurity (03/06/2021)   Received from [facility], [facility]   Hunger Vital Sign    Worried About Running Out of Food in the Last Year: Never true    Ran Out of Food in the Last Year: Never true  Transportation Needs: No Transportation Needs (03/06/2021)   Received from [facility], [facility]   PRAPARE - Transportation    Lack of Transportation (Medical): No    Lack of Transportation (Non-Medical): No  Physical Activity: Not on file  Stress: Not on file  Social Connections: Not on file  Intimate Partner Violence: Not on file    Vital Signs: There were no vitals taken for this visit. There is no height or weight on file to calculate BMI.    Examination: General Appearance: The patient is well-developed, well-nourished, and in no distress. Neck Circumference: 38 cm Skin: Gross inspection of  skin unremarkable.  Head: normocephalic, no gross deformities. Eyes: no gross deformities noted. ENT: ears appear grossly normal Neurologic: Alert and oriented. No involuntary movements.  STOP BANG RISK ASSESSMENT S (snore) Have you been told that you snore?     NO   T (tired) Are you often tired, fatigued, or sleepy during the day?   YES/NO  O (obstruction) Do you stop breathing, choke, or gasp during sleep? NO   P (pressure) Do you have or are you being treated for high blood pressure? YES   B (BMI) Is your body index greater than 35 kg/m? YES/NO   A (age) Are you 27 years old or older? YES   N (neck) Do you have a neck circumference greater than 16 inches?   NO   G (gender) Are you a male? YES   TOTAL STOP/BANG "YES" ANSWERS        A STOP-Bang score of 2 or less is considered low risk, and a score of 5 or more is high risk for having either moderate or severe OSA. For people who score 3 or 4, doctors may need to perform further assessment to determine how likely they are to have OSA.         EPWORTH SLEEPINESS SCALE:  Scale:  (0)= no chance of dozing; (1)= slight chance of dozing; (2)= moderate chance of dozing; (3)= high chance of dozing  Chance  Situtation    Sitting and reading: ***    Watching TV: ***    Sitting Inactive in public: ***    As a passenger in car: ***      Lying down to rest: ***    Sitting and talking: ***    Sitting quielty after lunch: ***    In a car, stopped in traffic: ***   TOTAL SCORE:   *** out of 24    SLEEP STUDIES:  PSG (04/2015) AHI 7.4/hr, 67% central, min SpO2 87%, recommend BiPAP titration  Titration (05/2016) recommend BiPAP ST@ 17/11 cmH2O, back up RR 10 bpm Titration (11/2021) recommend CPAP@ 10 cmH2O   CPAP COMPLIANCE DATA:  Date Range: 03/12/2022-01/10/2023  Average Daily Use: 7 hours 57 minutes  Median Use: 8 hours 7 minutes  Compliance for > 4 Hours: 98%  AHI: 13.3 respiratory events per hour  Days  Used: 304/305 days  Mask Leak: 83.1  95th Percentile Pressure: 11.9         LABS: No results found for this or any previous visit (from the past 2160 hours).  Radiology: No results found.  No results found.  No results found.    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/2017   Cerebellar stroke (HCC) 01/01/2017   Knee pain, bilateral 08/31/2016   Seizures (HCC) 07/31/2016   Chronic venous insufficiency 04/09/2016   Impaired functional mobility, balance, gait, and endurance 11/14/2015   Hypogonadism in male 05/27/2015   BPH with obstruction/lower urinary tract symptoms 04/15/2015   Anemia 07/23/2014   Narrowing of intervertebral disc  space 07/23/2014   Depression with anxiety 07/23/2014   GERD without esophagitis 07/23/2014   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   Scoliosis 07/23/2014   Compressed spine fracture (HCC) 07/23/2014   Spinal stenosis 07/23/2014   Obstructive apnea 12/08/2013   Generalized OA 11/19/2013   Idiopathic scoliosis and kyphoscoliosis 08/20/2013   Osteoporosis 07/30/2013   Dermatitis, stasis 06/29/2013   Curvature of spine 10/10/2012   Acquired deformities of spine 10/10/2012   Cervical spondylosis without myelopathy 05/03/2011   Cervical osteoarthritis 05/03/2011   DDD (degenerative disc disease), lumbosacral 05/03/2011   Angulation of spine 05/03/2011   Degenerative arthritis of lumbar spine 05/03/2011   Lumbosacral  spondylosis 05/03/2011   Back pain, chronic 03/29/2011   Benign fibroma of prostate 03/29/2011   Arthritis 08/07/2010   Constipation due to opioid therapy 08/07/2010      The patient *** tolerate PAP and reports *** benefit from PAP use. The patient was reminded how to *** and advised to ***. The patient was also counselled on ***. The compliance is ***. The AHI is ***.   ***  General Counseling: I have discussed the findings of the evaluation and examination with Dustin Turner.  I have also discussed any further diagnostic evaluation thatmay be needed or ordered today. Dustin Turner verbalizes understanding of the findings of todays visit. We also reviewed his medications today and discussed drug interactions and side effects including but not limited excessive drowsiness and altered mental states. We also discussed that there is always a risk not just to him but also people around him. he has been encouraged to call the office with any questions or concerns that should arise related to todays visit.  No orders of the defined types were placed in this encounter.       I have personally obtained a history, examined the patient, evaluated laboratory and imaging results, formulated the assessment and plan and placed orders.  Yevonne Pax, MD [facility] Diplomate ABMS Pulmonary Critical Care Medicine and Sleep Medicine

## 2023-01-14 ENCOUNTER — Ambulatory Visit (INDEPENDENT_AMBULATORY_CARE_PROVIDER_SITE_OTHER): Payer: Medicare HMO | Admitting: Internal Medicine

## 2023-01-14 DIAGNOSIS — Z7189 Other specified counseling: Secondary | ICD-10-CM

## 2023-01-14 DIAGNOSIS — G4739 Other sleep apnea: Secondary | ICD-10-CM

## 2023-01-14 NOTE — Patient Instructions (Signed)

## 2023-02-04 ENCOUNTER — Encounter: Payer: Medicare HMO | Attending: Physician Assistant | Admitting: Physician Assistant

## 2023-02-04 DIAGNOSIS — L97422 Non-pressure chronic ulcer of left heel and midfoot with fat layer exposed: Secondary | ICD-10-CM | POA: Diagnosis present

## 2023-02-04 DIAGNOSIS — I872 Venous insufficiency (chronic) (peripheral): Secondary | ICD-10-CM | POA: Insufficient documentation

## 2023-02-04 DIAGNOSIS — G40909 Epilepsy, unspecified, not intractable, without status epilepticus: Secondary | ICD-10-CM | POA: Insufficient documentation

## 2023-02-04 DIAGNOSIS — M1990 Unspecified osteoarthritis, unspecified site: Secondary | ICD-10-CM | POA: Diagnosis not present

## 2023-02-04 DIAGNOSIS — G4730 Sleep apnea, unspecified: Secondary | ICD-10-CM | POA: Insufficient documentation

## 2023-02-04 DIAGNOSIS — D649 Anemia, unspecified: Secondary | ICD-10-CM | POA: Diagnosis not present

## 2023-02-04 DIAGNOSIS — G603 Idiopathic progressive neuropathy: Secondary | ICD-10-CM | POA: Diagnosis not present

## 2023-02-04 DIAGNOSIS — L97522 Non-pressure chronic ulcer of other part of left foot with fat layer exposed: Secondary | ICD-10-CM | POA: Diagnosis not present

## 2023-02-04 DIAGNOSIS — F03A0 Unspecified dementia, mild, without behavioral disturbance, psychotic disturbance, mood disturbance, and anxiety: Secondary | ICD-10-CM | POA: Insufficient documentation

## 2023-02-04 NOTE — Progress Notes (Signed)
Dustin Turner, Dustin Turner ([id number]) [id number] Page 1 of 12 Visit Report for 02/04/2023 Chief Complaint Document Details Patient Name: Date of Service: ARVID, DEGROOT [facility] D. 02/04/2023 2:00 PM Medical Record Number: [id number] Patient Account Number: [id number] Date of Birth/Sex: Treating RN: [birth year] (88 y.o. Dustin Turner Primary Care Provider: Smiley Houseman Other Clinician: Referring Provider: Treating Provider/Extender: [name] Weeks in Treatment: 0 Information Obtained from: Patient Chief Complaint Left foot Dustin heel ulcers Electronic Signature(s) Signed: 02/04/2023 3:20:01 PM By: Allen Derry PA-C Entered By: Allen Derry on 02/04/2023 15:20:01 -------------------------------------------------------------------------------- Debridement Details Patient Name: Date of Service: Dustin Barrio HN D. 02/04/2023 2:00 PM Medical Record Number: [id number] Patient Account Number: [id number] Date of Birth/Sex: Treating RN: May 06, 1935 (88 y.o. Dustin Turner Primary Care Provider: Smiley Houseman Other Clinician: Referring Provider: Treating Provider/Extender: [name] Weeks in Treatment: 0 Debridement Performed for Assessment: Wound #2 Left,Lateral Calcaneus Performed By: Physician Allen Derry, PA-C The following information was scribed by: [name] The information was scribed for: Allen Derry Debridement Type: Debridement Level of Consciousness (Pre-procedure): Awake Dustin Alert Pre-procedure Verification/Time Out Yes - 15:11 Taken: Pain Control: Lidocaine 4% T opical Solution Percent of Wound Bed Debrided: 50% T Area Debrided (cm): otal 0.53 Tissue Dustin other material debrided: Viable, Non-Viable, Callus, Slough, Subcutaneous, Biofilm, Slough Level: Skin/Subcutaneous Tissue Debridement Description: Excisional Instrument: Curette Bleeding: Minimum Hemostasis Achieved: Pressure Response to Treatment: Procedure was  tolerated well Level of Consciousness Arlie SolomonsMACLOVIO, EXLINE ([id number]) [id number] Page 2 of 12 Level of Consciousness (Post- Awake Dustin Alert procedure): Post Debridement Measurements of Total Wound Length: (cm) 0.9 Width: (cm) 1.5 Depth: (cm) 0.1 Volume: (cm) 0.106 Character of Wound/Ulcer Post Debridement: Stable Post Procedure Diagnosis Same as Pre-procedure Electronic Signature(s) Signed: 02/04/2023 5:02:42 PM By: [name] Signed: 02/04/2023 5:18:10 PM By: Allen Derry PA-C Entered By: [name] on 02/04/2023 15:14:22 -------------------------------------------------------------------------------- Debridement Details Patient Name: Date of Service: Dustin Barrio HN D. 02/04/2023 2:00 PM Medical Record Number: [id number] Patient Account Number: [id number] Date of Birth/Sex: Treating RN: 1935/10/03 (88 y.o. Dustin Turner Primary Care Provider: Smiley Houseman Other Clinician: Referring Provider: Treating Provider/Extender: [name] Weeks in Treatment: 0 Debridement Performed for Assessment: Wound #1 Left,Distal,Dorsal Foot Performed By: Physician Allen Derry, PA-C Debridement Type: Debridement Level of Consciousness (Pre-procedure): Awake Dustin Alert Pre-procedure Verification/Time Out Yes - 15:14 Taken: Pain Control: Lidocaine 4% T opical Solution Percent of Wound Bed Debrided: 100% T Area Debrided (cm): otal 0.16 Tissue Dustin other material debrided: Viable, Non-Viable, Slough, Subcutaneous, Biofilm, Slough Level: Skin/Subcutaneous Tissue Debridement Description: Excisional Instrument: Curette Bleeding: Minimum Hemostasis Achieved: Pressure Response to Treatment: Procedure was tolerated well Level of Consciousness (Post- Awake Dustin Alert procedure): Post Debridement Measurements of Total Wound Length: (cm) 0.5 Width: (cm) 0.4 Depth: (cm) 0.1 Volume: (cm) 0.016 Character of Wound/Ulcer Post Debridement:  Stable Post Procedure Diagnosis Same as Pre-procedure Electronic Signature(s) Signed: 02/04/2023 5:02:42 PM By: [name] Signed: 02/04/2023 5:18:10 PM By: Allen Derry PA-C Entered By: [name] on 02/04/2023 15:15:16 [name], [name] ([id number]) [id number] Page 3 of 12 -------------------------------------------------------------------------------- HPI Details Patient Name: Date of Service: Dustin Turner, Dustin D. 02/04/2023 2:00 PM Medical Record Number: [id number] Patient Account Number: [id number] Date of Birth/Sex: Treating RN: Oct 04, 1935 (88 y.o. Dustin Turner Primary Care Provider: Smiley Houseman Other Clinician: Referring Provider: Treating Provider/Extender: [name] Weeks in Treatment: 0 History of Present Illness Chronic/Inactive Conditions Condition 1: 02-04-23 patient's ABI on the right was 1.22 with a TBI of  1.09 Dustin on the left was 1.08 with a TBI of 0.95. He appears to have good arterial flow with 2+ Dustin equal bilaterally pulses Dustin subsequently shows no signs of an issue here with arterial flow that would indicate he is can have a difficult time healing. HPI Description: 02-03-2022 upon evaluation today patient presents for initial inspection here in our clinic concerning wounds he has on his foot at the base of the third toe right between the third Dustin fourth as well as the lateral heel Dustin this is on the left as well. These wounds have been present for about a year he tells me. During that time he has had an arterial study Dustin they told him that his blood flow was perfect he is information as above on the arterial testing. With that being said he has had home health coming out he lives in assisted living they have been helping with dressing changes Dustin the good news is that he actually seems to be doing well Dustin is better than where these were but they are just not 100% yet. At this point he does have what appears to  be some neuropathy I think that it is part of the reason why the wounds are present I think also he has quite a bit of swelling that was not immediately obvious except for the pitting edema when I did evaluate him today. He does have some mild dementia but seems to be able to answer most of the questions without complication. Patient does not have diabetes, he does not smoke, Dustin he does not consume alcoholic beverages. Again he does have some neuropathy Dustin venous stasis but other than this I see no other major complications in my opinion. I do think however the neuropathy Dustin the venous stasis is likely contributing to the wounds lack of healing. Electronic Signature(s) Signed: 02/04/2023 3:40:19 PM By: Allen Derry PA-C Entered By: Allen Derry on 02/04/2023 15:40:19 -------------------------------------------------------------------------------- Physical Exam Details Patient Name: Date of Service: DIGBY, MIYAGI HN D. 02/04/2023 2:00 PM Medical Record Number: [id number] Patient Account Number: [id number] Date of Birth/Sex: Treating RN: 07-Apr-1935 (88 y.o. Dustin Turner Primary Care Provider: Smiley Houseman Other Clinician: Referring Provider: Treating Provider/Extender: Herbert Spires, Efraim Kaufmann Weeks in Treatment: 0 Constitutional sitting or standing blood pressure is within target range for patient.. pulse regular Dustin within target range for patient.[name] Kitchen respirations regular, non-labored Dustin within target range for patient.[name] Kitchen temperature within target range for patient.. Well-nourished Dustin well-hydrated in no acute distress. Eyes conjunctiva clear no eyelid edema noted. pupils equal round Dustin reactive to light Dustin accommodation. Ears, Nose, Mouth, Dustin Throat Thoman, ELSON TWEEDLE ([id number]) [id number] Page 4 of 12 no gross abnormality of ear auricles or external auditory canals. normal hearing noted during conversation. mucus membranes moist. Respiratory normal  breathing without difficulty. Cardiovascular 2+ dorsalis pedis/posterior tibialis pulses. no clubbing, cyanosis, significant edema, <3 sec cap refill. Musculoskeletal normal gait Dustin posture. no significant deformity or arthritic changes, no loss or range of motion, no clubbing. Psychiatric this patient is able to make decisions Dustin demonstrates good insight into disease process. Alert Dustin Oriented x 3. pleasant Dustin cooperative. Notes Upon inspection patient's wounds did require sharp debridement at both locations to clear away necrotic debris tolerated the debridement today without complication Dustin postdebridement wound bed appears to be doing significantly better which is great news. I do not see any signs of active infection at this time. Electronic Signature(s) Signed: 02/04/2023 3:41:31 PM By: Allen Derry PA-C Entered  By: Allen Derry on 02/04/2023 15:41:31 -------------------------------------------------------------------------------- Physician Orders Details Patient Name: Date of Service: OSMANI, LACLAIR D. 02/04/2023 2:00 PM Medical Record Number: [id number] Patient Account Number: [id number] Date of Birth/Sex: Treating RN: 12-12-1935 (88 y.o. Dustin Turner Primary Care Provider: Smiley Houseman Other Clinician: Referring Provider: Treating Provider/Extender: [name] Weeks in Treatment: 0 The following information was scribed by: [name] The information was scribed for: Allen Derry Verbal / Phone Orders: No Diagnosis Coding Follow-up Appointments Return Appointment in 1 week. Home Health Home Health Company: - [facility] for wound care. May utilize formulary equivalent dressing for wound treatment orders unless otherwise specified. Home Health Nurse may visit PRN to address patients wound care needs. Scheduled days for dressing changes to be completed; exception, patient has scheduled wound care visit that day. -  please change dressing Dustin compression wrap on Thursday. If you do not have an URGO K2 LITE wrap please place patient in a 3 layer wrap. **Please direct any NON-WOUND related issues/requests for orders to patient's Primary Care Physician. **If current dressing causes regression in wound condition, may D/C ordered dressing product/s Dustin apply Normal Saline Moist Dressing daily until next Wound Healing Center or Other MD appointment. **Notify Wound Healing Center of regression in wound condition at [phone]. Bathing/ [employer] wounds with antibacterial soap Dustin water. May shower with wound dressing protected with water repellent cover or cast protector. No tub bath. Anesthetic (Use 'Patient Medications' Section for Anesthetic Order Entry) Lidocaine applied to wound bed Edema Control - Orders / Instructions Elevate, Exercise Daily Dustin A void Standing for Long Periods of Time. Elevate legs to the level of the heart Dustin pump ankles as often as possible Elevate leg(s) parallel to the floor when sitting. DO YOUR BEST to sleep in the bed at night. DO NOT sleep in your recliner. Long hours of sitting in a recliner leads to swelling of the legs Dustin/or potential wounds on your backside. TEOMAN, ZUPON ([id number]) [id number] Page 5 of 12 Wound Treatment Wound #1 - Foot Wound Laterality: Dorsal, Left, Distal Cleanser: Soap Dustin Water 2 x Per Week/15 Days Discharge Instructions: Gently cleanse wound with antibacterial soap, rinse Dustin pat dry prior to dressing wounds Cleanser: Wound Cleanser 2 x Per Week/15 Days Discharge Instructions: Wash your hands with soap Dustin water. Remove old dressing, discard into plastic bag Dustin place into trash. Cleanse the wound with Wound Cleanser prior to applying a clean dressing using gauze sponges, not tissues or cotton balls. Do not scrub or use excessive force. Pat dry using gauze sponges, not tissue or cotton balls. Prim  Dressing: Hydrofera Blue Ready Transfer Foam, 2.5x2.5 (in/in) 2 x Per Week/15 Days ary Discharge Instructions: Apply Hydrofera Blue Ready to wound bed as directed Secondary Dressing: ABD Pad 5x9 (in/in) 2 x Per Week/15 Days Discharge Instructions: Cover with ABD pad Compression Wrap: Urgo K2 Lite, two layer compression system, large 2 x Per Week/15 Days Wound #2 - Calcaneus Wound Laterality: Left, Lateral Cleanser: Soap Dustin Water Discharge Instructions: Gently cleanse wound with antibacterial soap, rinse Dustin pat dry prior to dressing wounds Cleanser: Wound Cleanser Discharge Instructions: Wash your hands with soap Dustin water. Remove old dressing, discard into plastic bag Dustin place into trash. Cleanse the wound with Wound Cleanser prior to applying a clean dressing using gauze sponges, not tissues or cotton balls. Do not scrub or use excessive force. Pat dry using gauze sponges, not tissue or cotton balls. Prim  Dressing: Hydrofera Blue Ready Transfer Foam, 2.5x2.5 (in/in) ary Discharge Instructions: Apply Hydrofera Blue Ready to wound bed as directed Secondary Dressing: Coverlet Latex-Free Fabric Adhesive Dressings Discharge Instructions: Knuckle Electronic Signature(s) Signed: 02/05/2023 4:29:01 PM By: [name] Signed: 02/05/2023 6:09:39 PM By: Allen Derry PA-C Previous Signature: 02/04/2023 5:02:42 PM Version By: [name] Previous Signature: 02/04/2023 5:18:10 PM Version By: Allen Derry PA-C Entered By: [name] on 02/05/2023 09:40:46 -------------------------------------------------------------------------------- Problem List Details Patient Name: Date of Service: Dustin Barrio HN D. 02/04/2023 2:00 PM Medical Record Number: [id number] Patient Account Number: [id number] Date of Birth/Sex: Treating RN: [birth year] (88 y.o. 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 02/04/2023 2:00 PM Medical Record Number: [id number] Patient Account Number: [id number] Date of Birth/Sex: Treating RN: 02-Dec-1935 (88 y.o. Dustin Turner Primary Care Provider: Smiley Houseman Other Clinician: Referring Provider: Treating Provider/Extender: [name] Weeks in Treatment: 0 Subjective Chief Complaint Information obtained from Patient Left foot Dustin heel ulcers History of Present Illness (HPI) Chronic/Inactive Condition: 02-04-23 patient's ABI on the right was 1.22 with a TBI of 1.09 Dustin on the left was 1.08 with a TBI of 0.95. He appears to have good arterial flow with 2+ Dustin equal bilaterally pulses Dustin subsequently shows no signs of an issue here with arterial flow that would indicate he is can have a difficult time healing. 02-03-2022 upon evaluation today patient presents for initial inspection here in our clinic concerning wounds he has on his foot at  the base of the third toe right between the third Dustin fourth as well as the lateral heel Dustin this is on the left as well. These wounds have been present for about a year he tells me. During that time he has had an arterial study Dustin they told him that his blood flow was perfect he is information as above on the arterial testing. With that being said he has had home health coming out he lives in assisted living they have been helping with dressing changes Dustin the good news is that he actually seems to be doing well Dustin is better than where these were but they are just not 100% yet. At this point he does have what appears to be some neuropathy I think that it is part of the reason why the wounds are present I think also he has quite a bit of swelling that was not immediately obvious except for the pitting edema when I did evaluate him today. He does have some mild dementia but seems to be able to answer most of the questions without complication. Patient does not have diabetes, he does not smoke, Dustin he does not consume alcoholic beverages. Again he  does have some neuropathy Dustin venous stasis but other than this I see no other major complications in my opinion. I do think however the neuropathy Dustin the venous stasis is likely contributing to the wounds lack of healing. Patient History Information obtained from Patient, Chart. Allergies amitriptyline, nortriptyline Social History NALIN, MUNDINE ([id number]) [id number] Page 7 of 12 Never smoker, Alcohol Use - Never, Caffeine Use - Never. Medical History Eyes Patient has history of Cataracts - surgery Hematologic/Lymphatic Patient has history of Anemia Respiratory Patient has history of Sleep Apnea - CPAP Musculoskeletal Patient has history of Osteoarthritis Neurologic Patient has history of Dementia, Seizure Disorder - 5 years ago Medical A Surgical History Notes nd Cardiovascular chronic venous insufficiency,  stroke Review of Systems (ROS) Constitutional Symptoms (General Health) Denies complaints or symptoms of Fatigue, Fever, Chills, Marked Weight Change. Ear/Nose/Mouth/Throat hearing aid Hematologic/Lymphatic Denies complaints or symptoms of Bleeding / Clotting Disorders, Human Immunodeficiency Virus. Gastrointestinal Denies complaints or symptoms of Frequent diarrhea, Nausea, Vomiting. Endocrine Denies complaints or symptoms of Hepatitis, Thyroid disease, Polydypsia (Excessive Thirst). Genitourinary AKI, indwelling catheter, BPH Immunological Denies complaints or symptoms of Hives, Itching. Integumentary (Skin) Complains or has symptoms of Wounds, Swelling. Musculoskeletal Complains or has symptoms of Muscle Weakness. General Notes: Patient states he has an intrathecal pump, electric stimulator - no active currently Objective Constitutional sitting or standing blood pressure is within target range for patient.. pulse regular Dustin within target range for patient.[name] Kitchen respirations regular, non-labored Dustin within target range for patient.[name] Kitchen temperature within target range for patient.. Well-nourished Dustin well-hydrated in no acute distress. Vitals Time Taken: 2:20 PM, Height: 65 in, Source: Stated, Weight: 185 lbs, Source: Stated, BMI: 30.8, Temperature: 97.6 F, Pulse: 77 bpm, Respiratory Rate: 18 breaths/min, Blood Pressure: 135/73 mmHg. Eyes conjunctiva clear no eyelid edema noted. pupils equal round Dustin reactive to light Dustin accommodation. Ears, Nose, Mouth, Dustin Throat no gross abnormality of ear auricles or external auditory canals. normal hearing noted during conversation. mucus membranes moist. Respiratory normal breathing without difficulty. Cardiovascular 2+ dorsalis pedis/posterior tibialis pulses. no clubbing, cyanosis, significant edema, Musculoskeletal normal gait Dustin posture. no significant deformity or arthritic changes, no loss or range of motion, no  clubbing. Psychiatric this patient is able to make decisions Dustin demonstrates good insight into disease process. Alert Dustin Oriented x 3. pleasant Dustin cooperative. General Notes: Upon inspection patient's wounds did require sharp debridement at both locations to clear away necrotic debris tolerated the debridement today without complication Dustin postdebridement wound bed appears to be doing significantly better which is great news. I do not see any signs of active infection at this time. Integumentary (Hair, Skin) Wound #1 status is Open. Original cause of wound was Gradually Appeared. The date acquired was: 01/29/2022. The wound is located on the Left,Distal,Dorsal Foot. The wound measures 0.5cm length x 0.4cm width x 0.1cm depth; 0.157cm^2 area Dustin 0.016cm^3 volume. There is Fat Layer (Subcutaneous Tissue) exposed. There is a small amount of serous drainage noted. There is medium (34-66%) pink granulation within the wound bed. There is a medium (34-66%) amount of necrotic tissue within the wound bed including Adherent Slough. Wound #2 status is Open. Original cause of wound was Gradually Appeared. The date acquired was: 01/29/2022. The wound is located on the Left,Lateral Calcaneus. The wound measures 0.5cm length x 1.9cm width x 0.1cm depth; 0.746cm^2 area Dustin 0.075cm^3 volume. There is Fat Layer (Subcutaneous Tissue) exposed. There is a medium amount of serous drainage noted. There is small (1-33%) pink, pale granulation within  the wound bed. There is a medium (34-66%) amount of necrotic tissue within the wound bed including Adherent Slough. 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 There was a Excisional Skin/Subcutaneous Tissue Debridement with a total area of 0.16 sq cm performed by Allen Derry, PA-C. With the following instrument(s): Curette to remove Viable Dustin Non- Viable tissue/material. Material removed includes Subcutaneous Tissue, Slough, Dustin Biofilm after achieving pain control using Lidocaine 4% Topical Solution. No specimens were taken. A time out was conducted at 15:14, prior to the start of the procedure. A Minimum amount of bleeding was controlled with Pressure. The procedure was tolerated well. Post Debridement Measurements: 0.5cm length x 0.4cm width x 0.1cm depth; 0.016cm^3 volume. Character of Wound/Ulcer Post Debridement is stable. Post procedure Diagnosis Wound #1: Same as Pre-Procedure Pre-procedure diagnosis of Wound #1 is a Neuropathic Ulcer-Non Diabetic located on the Left,Distal,Dorsal Foot . There was a Double Layer Compression Therapy Procedure by [name], RN. Post procedure Diagnosis Wound #1: Same as Pre-Procedure Wound #2 Pre-procedure diagnosis of Wound #2 is a Neuropathic Ulcer-Non Diabetic located on the Left,Lateral Calcaneus . There was a Excisional Skin/Subcutaneous Tissue Debridement with a total area of 0.53 sq cm performed by Allen Derry, PA-C. With the following instrument(s): Curette to remove Viable Dustin Non- Viable tissue/material. Material removed includes Callus, Subcutaneous Tissue, Slough, Dustin Biofilm after achieving pain control using Lidocaine 4% Topical Solution. No specimens were taken. A time out was conducted at 15:11, prior to the start of the procedure. A Minimum amount of bleeding was controlled with Pressure. The procedure was tolerated  well. Post Debridement Measurements: 0.9cm length x 1.5cm width x 0.1cm depth; 0.106cm^3 volume. Character of Wound/Ulcer Post Debridement is stable. Post procedure Diagnosis Wound #2: Same as Pre-Procedure Pre-procedure diagnosis of Wound #2 is a Neuropathic Ulcer-Non Diabetic located on the Left,Lateral Calcaneus . There was a Double Layer Compression Therapy Procedure by [name], RN. Post procedure Diagnosis Wound #2: Same as Pre-Procedure Plan Follow-up Appointments: Wound #1 Left,Distal,Dorsal Foot: Return Appointment in 1 week. Wound #2 Left,Lateral Calcaneus: Return Appointment in 1 week. Home Health: Wound #1 Left,Distal,Dorsal Foot: Home Health Company: - [facility] for wound care. May utilize formulary equivalent dressing for wound treatment orders unless otherwise specified. Home Health Nurse may visit PRN to address patients wound care needs. Scheduled days for dressing changes to be completed; exception, patient has scheduled wound care visit that day. - please change dressing Dustin compression wrap on Thursday. If you do not have an URGO K2 LITE wrap please place patient in a 3 layer wrap. **Please direct any NON-WOUND related issues/requests for orders to patient's Primary Care Physician. **If current dressing causes regression in wound condition, may D/C ordered dressing product/s Dustin apply Normal Saline Moist Dressing daily until next Wound Healing Center or Other MD appointment. **Notify Wound Healing Center of regression in wound condition at [phone]. Wound #2 Left,Lateral Calcaneus: Home Health Company: - [facility] CONTINUE Home Health for wound care. May utilize formulary equivalent dressing for wound treatment  orders unless otherwise specified. Home Health Nurse may visit PRN to address patients wound care needs. Scheduled days for dressing changes to be completed; exception, patient has scheduled wound care visit  that day. **Please direct any NON-WOUND related issues/requests for orders to patient's Primary Care Physician. **If current dressing causes regression in wound condition, may D/C ordered dressing product/s Dustin apply Normal Saline Moist Dressing daily until next Wound Healing Center or Other MD appointment. **Notify Wound Healing Center of regression in wound condition at [phone]. Bathing/ Shower/ Hygiene: Wound #1 Left,Distal,Dorsal Foot: Wash wounds with antibacterial soap Dustin water. May shower with wound dressing protected with water repellent cover or cast protector. No tub bath. Wound #2 Left,Lateral Calcaneus: Wash wounds with antibacterial soap Dustin water. May shower with wound dressing protected with water repellent cover or cast protector. No tub bath. Dustin Turner, Dustin Turner ([id number]) [id number] Page 9 of 12 Anesthetic (Use 'Patient Medications' Section for Anesthetic Order Entry): Wound #1 Left,Distal,Dorsal Foot: Lidocaine applied to wound bed Wound #2 Left,Lateral Calcaneus: Lidocaine applied to wound bed Edema Control - Orders / Instructions: Wound #1 Left,Distal,Dorsal Foot: UrgoK2 LITE Elevate, Exercise Daily Dustin Avoid Standing for Long Periods of Time. Elevate legs to the level of the heart Dustin pump ankles as often as possible Elevate leg(s) parallel to the floor when sitting. DO YOUR BEST to sleep in the bed at night. DO NOT sleep in your recliner. Long hours of sitting in a recliner leads to swelling of the legs Dustin/or potential wounds on your backside. Wound #2 Left,Lateral Calcaneus: UrgoK2 LITE Elevate, Exercise Daily Dustin Avoid Standing for Long Periods of Time. Elevate legs to the level of the heart Dustin pump ankles as often as possible Elevate leg(s) parallel to the floor when sitting. DO YOUR BEST to sleep in the bed at night. DO NOT sleep in your recliner. Long hours of sitting in a recliner leads to swelling of the legs  Dustin/or potential wounds on your backside. WOUND #1: - Foot Wound Laterality: Dorsal, Left, Distal Cleanser: Soap Dustin Water 2 x Per Week/ Discharge Instructions: Gently cleanse wound with antibacterial soap, rinse Dustin pat dry prior to dressing wounds Cleanser: Wound Cleanser 2 x Per Week/ Discharge Instructions: Wash your hands with soap Dustin water. Remove old dressing, discard into plastic bag Dustin place into trash. Cleanse the wound with Wound Cleanser prior to applying a clean dressing using gauze sponges, not tissues or cotton balls. Do not scrub or use excessive force. Pat dry using gauze sponges, not tissue or cotton balls. Prim Dressing: Hydrofera Blue Ready Transfer Foam, 2.5x2.5 (in/in) 2 x Per Week/ ary Discharge Instructions: Apply Hydrofera Blue Ready to wound bed as directed Secondary Dressing: ABD Pad 5x9 (in/in) 2 x Per Week/ Discharge Instructions: Cover with ABD pad Com pression Wrap: Urgo K2 Lite, two layer compression system, large 2 x Per Week/ WOUND #2: - Calcaneus Wound Laterality: Left, Lateral Cleanser: Soap Dustin Water Discharge Instructions: Gently cleanse wound with antibacterial soap, rinse Dustin pat dry prior to dressing wounds Cleanser: Wound Cleanser Discharge Instructions: Wash your hands with soap Dustin water. Remove old dressing, discard into plastic bag Dustin place into trash. Cleanse the wound with Wound Cleanser prior to applying a clean dressing using gauze sponges, not tissues or cotton balls. Do not scrub or use excessive force. Pat dry using gauze sponges, not tissue or cotton balls. Prim Dressing: Hydrofera Blue Ready Transfer Foam, 2.5x2.5 (in/in) ary Discharge Instructions: Apply Hydrofera Blue Ready to wound bed as  directed Secondary Dressing: Coverlet Latex-Free Fabric Adhesive Dressings Discharge Instructions: Knuckle 1. Based on what I am seeing I do believe that the patient is making pretty good headway here towards getting the wounds closed I think  there are some things we can do to help out 1 of these I think is gena be to control the edema. I am going to place him in a Urgo K2 light compression wrap Dustin see if we can improve some of the edema has been wearing a compression sock but I think this will be much more effective. 2. I am going to recommend as well that we have the patient continue with [facility] which I think is gena do quite well for him he already has this at home I think is a good option. 3. I am also can recommend that we use dressing changes twice a week we will do 1 Dustin then have home health to 1. 4. I am also going to suggest that the patient should continue to elevate his legs much as possible the more this he can do I think the better off he will be. We will see patient back for reevaluation in 1 week here in the clinic. If anything worsens or changes patient will contact our office for additional recommendations. Electronic Signature(s) Signed: 02/04/2023 3:42:22 PM By: Allen Derry PA-C Entered By: Allen Derry on 02/04/2023 15:42:22 -------------------------------------------------------------------------------- ROS/PFSH Details Patient Name: Date of Service: Dustin Turner, Dustin Turner HN D. 02/04/2023 2:00 PM Medical Record Number: [id number] Patient Account Number: [id number] Date of Birth/Sex: Treating RN: 07/24/1935 (88 y.o. Dustin Turner Primary Care Provider: Smiley Houseman Other Clinician: Referring Provider: Treating Provider/Extender: [name] Weeks in Treatment: [address], [place] ([id number]) [id number] Page 10 of 12 Information Obtained From Patient Chart Constitutional Symptoms (General Health) Complaints Dustin Symptoms: Negative for: Fatigue; Fever; Chills; Marked Weight Change Hematologic/Lymphatic Complaints Dustin Symptoms: Negative for: Bleeding / Clotting Disorders; Human Immunodeficiency Virus Medical History: Positive for:  Anemia Gastrointestinal Complaints Dustin Symptoms: Negative for: Frequent diarrhea; Nausea; Vomiting Endocrine Complaints Dustin Symptoms: Negative for: Hepatitis; Thyroid disease; Polydypsia (Excessive Thirst) Immunological Complaints Dustin Symptoms: Negative for: Hives; Itching Integumentary (Skin) Complaints Dustin Symptoms: Positive for: Wounds; Swelling Musculoskeletal Complaints Dustin Symptoms: Positive for: Muscle Weakness Medical History: Positive for: Osteoarthritis Eyes Medical History: Positive for: Cataracts - surgery Ear/Nose/Mouth/Throat Complaints Dustin Symptoms: Review of System Notes: hearing aid Respiratory Medical History: Positive for: Sleep Apnea - CPAP Cardiovascular Medical History: Past Medical History Notes: chronic venous insufficiency, stroke Genitourinary Complaints Dustin Symptoms: Review of System Notes: AKI, indwelling catheter, BPH Neurologic Medical History: Positive for: Dementia; Seizure Disorder - 5 years ago HBO Extended History Items Eyes: ADAN, DEWAARD ([id number]) [id number] Page 11 of 12 Cataracts Immunizations Pneumococcal Vaccine: Received Pneumococcal Vaccination: Yes Received Pneumococcal Vaccination On or After 60th Birthday: Yes Implantable Devices Yes Family Dustin Social History Never smoker; Alcohol Use: Never; Caffeine Use: Never Social Determinants of Health (SDOH) 1. In the past 2 months, did you or others you live with eat smaller meals or skip meals because you didn't have money for foodo : No 2. Are you homeless or worried that you might be in the futureo : No 3. Do you have trouble paying for your utilities (gas, electricity, phone)o : No 4. Do you have trouble finding or paying for a rideo : No 5. Do you need daycare, or better daycare, for your kidso : No 6. Are you unemployed or without regular incomeo : No 7. Do  you need help finding a better jobo : No 8. Do you need help getting more  educationo : No 9. Are you concerned about someone in your home using drugs or alcoholo : No 10. Do you feel unsafe in your daily lifeo : No 11. Is anyone in your home threatening or abusing youo : No 12. Do you lack quality relationships that make you feel valued Dustin supportedo : No 13. Do you need help getting cultural information in a language you understando : No 14. Do you need help getting internet accesso : No Advanced Directives Dustin Instructions Spiritual or Cultural beliefs preclude asking about Advance Care Planning: No Advanced Directives: No Do not resuscitate: No Living Will: No Medical Power of Attorney: No Surrogate Decision Maker: No Notes Patient states he has an intrathecal pump, electric stimulator - no active currently Electronic Signature(s) Signed: 02/04/2023 5:02:42 PM By: [name] Signed: 02/04/2023 5:18:10 PM By: Allen Derry PA-C Entered By: [name] on 02/04/2023 14:44:41 -------------------------------------------------------------------------------- SuperBill Details Patient Name: Date of Service: Dustin Barrio HN D. 02/04/2023 Medical Record Number: [id number] Patient Account Number: [id number] Date of Birth/Sex: Treating RN: Oct 20, 1935 (88 y.o. 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, mild, without behavioral disturbance, psychotic disturbance, mood  disturbance, Dustin anxiety Facility Procedures : CPT4 Code: 78295621 Description: 99213 - WOUND CARE VISIT-LEV 3 EST PT Modifier: Quantity: 1 : CPT4 Code: 30865784 Description: 11042 - DEB SUBQ TISSUE 20 SQ CM/< ICD-10 Diagnosis Description 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 Modifier: Quantity: 1 Physician Procedures : CPT4 Code Description Modifier [id number] WC PHYS LEVEL 3 NEW PT ICD-10 Diagnosis Description 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 I87.332 Chronic venous hypertension (idiopathic) with ulcer Dustin inflammation of left lower extremity L97.522 Non-pressure  chronic ulcer of other part of left foot with fat layer exposed 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

## 2023-02-04 NOTE — Progress Notes (Signed)
YOANDY, PARR ([id number]) [id number] Page 1 of 5 Visit Report for 02/04/2023 Abuse Risk Screen Details Patient Name: Date of Service: Dustin Turner, Dustin Turner. 02/04/2023 2:00 PM Medical Record Number: [id number] Patient Account Number: [id number] Date of Birth/Sex: Treating RN: 1935-08-15 (88 y.o. Dustin Turner Primary Care Dustin Turner: Dustin Turner Other Clinician: Referring Dustin Turner: Treating Dustin Turner/Extender: Dustin Turner: 0 Abuse Risk Screen Items Answer ABUSE RISK SCREEN: Has anyone close to you tried to hurt or harm you recentlyo No Do you feel uncomfortable with anyone in your familyo No Has anyone forced you do things that you didnt want to doo No Electronic Signature(s) Signed: 02/04/2023 2:44:53 PM By: Dustin Turner Entered By: Dustin Turner on 02/04/2023 14:44:53 -------------------------------------------------------------------------------- Activities of Daily Living Details Patient Name: Date of Service: Dustin Turner, Dustin Turner. 02/04/2023 2:00 PM Medical Record Number: [id number] Patient Account Number: [id number] Date of Birth/Sex: Treating RN: 01/27/1935 (88 y.o. Dustin Turner Primary Care Cedric Denison: Dustin Turner Other Clinician: Referring Dustin Turner: Treating Dustin Turner: Dustin Turner: 0 Activities of Daily Living Items Answer Activities of Daily Living (Please select one for each item) Drive Automobile Not Able T Medications ake Completely Able Use T elephone Completely Able Care for Appearance Completely Able Use T oilet Completely Able Bath / Shower Need Assistance Dress Self Completely Able Feed Self Completely Able Walk Need Assistance Get In / Out Bed Completely Able [name] Need Assistance Dustin, Turner ([id number]) [id number] Page 2 of 5 Prepare Meals Need Assistance Handle Money Completely Able Shop  for Self Completely Able Electronic Signature(s) Signed: 02/04/2023 5:02:42 PM By: Dustin Turner Entered By: Dustin Turner on 02/04/2023 14:46:35 -------------------------------------------------------------------------------- Education Screening Details Patient Name: Date of Service: Dustin Barrio HN Turner. 02/04/2023 2:00 PM Medical Record Number: [id number] Patient Account Number: [id number] Date of Birth/Sex: Treating RN: 1935-08-14 (88 y.o. Dustin Turner Primary Care Ellanore Vanhook: Dustin Turner Other Clinician: Referring Dustin Turner: Treating Dustin Turner/Extender: Dustin Turner: 0 Primary Learner Assessed: Patient Learning Preferences/Education Level/Primary Language Learning Preference: Explanation, Demonstration, Printed Material Preferred Language: English Cognitive Barrier Language Barrier: No Translator Needed: No Memory Deficit: No Emotional Barrier: No Cultural/Religious Beliefs Affecting Medical Care: No Physical Barrier Impaired Vision: No Impaired Hearing: Yes Decreased Hand dexterity: No Knowledge/Comprehension Knowledge Level: High Comprehension Level: High Ability to understand written instructions: High Ability to understand verbal instructions: High Motivation Anxiety Level: Calm Cooperation: Cooperative Education Importance: Acknowledges Need Interest in Health Problems: Asks Questions Perception: Coherent Willingness to Engage in Self-Management High Activities: Readiness to Engage in Self-Management High Activities: Electronic Signature(s) Signed: 02/04/2023 2:45:20 PM By: Dustin Turner Entered By: Dustin Turner on 02/04/2023 14:45:20 Jagiello, Beulah Turner ([id number]) [phone] [id number].pdf Page 3 of 5 -------------------------------------------------------------------------------- Fall Risk Assessment Details Patient Name: Date of Service: Dustin Turner, Dustin Turner. 02/04/2023 2:00 PM Medical Record Number:  [id number] Patient Account Number: [id number] Date of Birth/Sex: Treating RN: 01/23/1935 (88 y.o. Dustin Turner Primary Care Dustin Turner: Dustin Turner Other Clinician: Referring Dustin Turner: Treating Dustin Turner/Extender: Dustin Turner: 0 Fall Risk Assessment Items Have you had 2 or more falls in the last 12 monthso 0 No Have you had any fall that resulted in injury in the last 12 monthso 0 No FALLS RISK SCREEN History of falling - immediate or within 3 months 0 No Secondary diagnosis (Do you have 2 or more medical diagnoseso) 0 No Ambulatory aid None/bed rest/wheelchair/nurse 0 No Crutches/cane/walker 0 No Furniture 0 No Intravenous therapy  Access/Saline/Heparin Lock 0 No Gait/Transferring Normal/ bed rest/ wheelchair 0 No Weak (short steps with or without shuffle, stooped but able to lift head while walking, may seek 0 No support from furniture) Impaired (short steps with shuffle, may have difficulty arising from chair, head down, impaired 0 No balance) Mental Status Oriented to own ability 0 No Electronic Signature(s) Signed: 02/04/2023 5:02:42 PM By: Dustin Turner Entered By: Dustin Turner on 02/04/2023 14:45:00 -------------------------------------------------------------------------------- Foot Assessment Details Patient Name: Date of Service: Dustin Barrio HN Turner. 02/04/2023 2:00 PM Medical Record Number: [id number] Patient Account Number: [id number] Date of Birth/Sex: Treating RN: [birth year] (88 y.o. Dustin Turner Primary Care Dustin Turner: Dustin Turner Other Clinician: Referring Dustin Turner: Treating Dustin Turner: Dustin Turner: 0 Foot Assessment Items Site Locations Dustin Turner ([id number]) [id number] [id number] Page 4 of 5 + = Sensation present, - = Sensation absent, C = Callus, U = Ulcer R = Redness, W = Warmth, M = Maceration, PU = Pre-ulcerative lesion F =  Fissure, S = Swelling, Turner = Dryness Assessment Right: Left: Other Deformity: No No Prior Foot Ulcer: No No Prior Amputation: No No Charcot Joint: No No Ambulatory Status: Ambulatory With Help Assistance Device: Wheelchair Gait: Steady Electronic Signature(s) Signed: 02/04/2023 5:02:42 PM By: Dustin Turner Entered By: Dustin Turner on 02/04/2023 14:27:18 -------------------------------------------------------------------------------- Nutrition Risk Screening Details Patient Name: Date of Service: Dustin Turner, Dustin Turner. 02/04/2023 2:00 PM Medical Record Number: [id number] Patient Account Number: [id number] Date of Birth/Sex: Treating RN: [birth year] (88 y.o. Dustin Turner Primary Care Adit Riddles: Dustin Turner Other Clinician: Referring Yanett Conkright: Treating Anicia Leuthold/Extender: Herbert Spires, Efraim Kaufmann Weeks in Turner: 0 Height (in): 65 Weight (lbs): 185 Body Mass Index (BMI): 30.8 Nutrition Risk Screening Items Score Screening NUTRITION RISK SCREEN: I have an illness or condition that made me change the kind and/or amount of food I eat 0 No I eat fewer than two meals per day 0 No I eat few fruits and vegetables, or milk products 0 No I have three or more drinks of beer, liquor or wine almost every day 0 No I have tooth or mouth problems that make it hard for me to eat 0 No JOLEN, BARDI ([id number]) [id number] [id number].pdf Page 5 of 5 I don't always have enough money to buy the food I need 0 No I eat alone most of the time 0 No I take three or more different prescribed or over-the-counter drugs a day 0 No Without wanting to, I have lost or gained 10 pounds in the last six months 0 No I am not always physically able to shop, cook and/or feed myself 0 No Nutrition Protocols Good Risk Protocol 0 No interventions needed Moderate Risk Protocol High Risk Proctocol Risk Level: Good Risk Score: 0 Electronic Signature(s) Signed: 02/04/2023 5:02:42 PM By:  Dustin Turner Entered By: Dustin Turner on 02/04/2023 14:45:29

## 2023-02-04 NOTE — Progress Notes (Signed)
BRYTON, KEELEN ([id number]) [id number] Page 1 of 9 Visit Report for 02/04/2023 Allergy List Details Patient Name: Date of Service: Dustin Turner, Dustin Turner [facility] D. 02/04/2023 2:00 PM Medical Record Number: [id number] Patient Account Number: [id number] Date of Birth/Sex: Treating RN: [birth year] (88 y.o. [name] Primary Care Tanny Harnack: Smiley Houseman Other Clinician: Referring Rin Gorton: Treating Brion Sossamon/Extender: Herbert Spires, Efraim Kaufmann Weeks in Treatment: 0 Allergies Active Allergies amitriptyline nortriptyline Allergy Notes Electronic Signature(s) Signed: 02/04/2023 5:02:42 PM By: [name] Entered By: [name] on 02/04/2023 14:25:14 -------------------------------------------------------------------------------- Arrival Information Details Patient Name: Date of Service: Dustin Barrio HN D. 02/04/2023 2:00 PM Medical Record Number: [id number] Patient Account Number: [id number] Date of Birth/Sex: Treating RN: 05/19/1935 (88 y.o. [name] Primary Care Jezreel Sisk: Smiley Houseman Other Clinician: Referring Amare Kontos: Treating Damiano Stamper/Extender: Weber Cooks in Treatment: 0 Visit Information Patient Arrived: Wheel Chair Arrival Time: 14:17 Accompanied By: self Transfer Assistance: EasyPivot Patient Lift Patient Identification Verified: Yes Secondary Verification Process Completed: Yes Patient Has Alerts: Yes Patient Alerts: ABI 03/21/2022 R 1.22 ABI 03/21/2022 L 1.08 Electronic Signature(s) HELMER, SLOVER ([id number]) [id number] Page 2 of 9 Signed: 02/04/2023 2:51:22 PM By: [name] Entered By: [name] on 02/04/2023 14:51:22 -------------------------------------------------------------------------------- Clinic Level of Care Assessment Details Patient Name: Date of Service: Dustin Turner, Dustin D. 02/04/2023 2:00 PM Medical Record Number: [id number] Patient Account Number:  [id number] Date of Birth/Sex: Treating RN: 04-15-1935 (88 y.o. [name] Primary Care Sedona Wenk: Smiley Houseman Other Clinician: Referring Neelam Tiggs: Treating Tayllor Breitenstein/Extender: [name] Weeks in Treatment: 0 Clinic Level of Care Assessment Items TOOL 1 Quantity Score []  - 0 Use when EandM and Procedure is performed on INITIAL visit ASSESSMENTS - Nursing Assessment / Reassessment X- 1 20 General Physical Exam (combine w/ comprehensive assessment (listed just below) when performed on new pt. evals) X- 1 25 Comprehensive Assessment (HX, ROS, Risk Assessments, Wounds Hx, etc.) ASSESSMENTS - Wound and Skin Assessment / Reassessment []  - 0 Dermatologic / Skin Assessment (not related to wound area) ASSESSMENTS - Ostomy and/or Continence Assessment and Care []  - 0 Incontinence Assessment and Management []  - 0 Ostomy Care Assessment and Management (repouching, etc.) PROCESS - Coordination of Care X - Simple Patient / Family Education for ongoing care 1 15 []  - 0 Complex (extensive) Patient / Family Education for ongoing care X- 1 10 Staff obtains Chiropractor, Records, T Results / Process Orders est []  - 0 Staff telephones HHA, Nursing Homes / Clarify orders / etc []  - 0 Routine Transfer to another Facility (non-emergent condition) []  - 0 Routine Hospital Admission (non-emergent condition) X- 1 15 New Admissions / Manufacturing engineer / Ordering NPWT Apligraf, etc. , []  - 0 Emergency Hospital Admission (emergent condition) PROCESS - Special Needs []  - 0 Pediatric / Minor Patient Management []  - 0 Isolation Patient Management []  - 0 Hearing / Language / Visual special needs []  - 0 Assessment of Community assistance (transportation, D/C planning, etc.) []  - 0 Additional assistance / Altered mentation []  - 0 Support Surface(s) Assessment (bed, cushion, seat, etc.) INTERVENTIONS - Miscellaneous []  - 0 External ear exam []  - 0 Patient  Transfer (multiple staff / Nurse, adult / Similar devices) []  - 0 Simple Staple / Suture removal (25 or less) Dustin Turner, Dustin Turner ([id number]) [id number] Page 3 of 9 []  - 0 Complex Staple / Suture removal (26 or more) []  - 0 Hypo/Hyperglycemic Management (do not check if billed separately) []  - 0 Ankle / Brachial Index (ABI) - do not check  if billed separately Has the patient been seen at the hospital within the last three years: Yes Total Score: 85 Level Of Care: New/Established - Level 3 Electronic Signature(s) Signed: 02/04/2023 5:02:42 PM By: [name] Entered By: [name] on 02/04/2023 15:21:45 -------------------------------------------------------------------------------- Compression Therapy Details Patient Name: Date of Service: Dustin Barrio HN D. 02/04/2023 2:00 PM Medical Record Number: [id number] Patient Account Number: [id number] Date of Birth/Sex: Treating RN: 11-Feb-1935 (88 y.o. [name] Primary Care Byanka Landrus: Smiley Houseman Other Clinician: Referring Pradyun Ishman: Treating Kennita Pavlovich/Extender: Herbert Spires, Efraim Kaufmann Weeks in Treatment: 0 Compression Therapy Performed for Wound Assessment: Wound #1 Left,Distal,Dorsal Foot Performed By: Clinician [name], RN Compression Type: Double Layer Post Procedure Diagnosis Same as Pre-procedure Electronic Signature(s) Signed: 02/04/2023 5:02:42 PM By: [name] Entered By: [name] on 02/04/2023 15:24:20 -------------------------------------------------------------------------------- Compression Therapy Details Patient Name: Date of Service: Dustin Barrio HN D. 02/04/2023 2:00 PM Medical Record Number: [id number] Patient Account Number: [id number] Date of Birth/Sex: Treating RN: [birth year] (88 y.o. [name] Primary Care Bryley Chrisman: Smiley Houseman Other Clinician: Referring Nayah Lukens: Treating Chena Chohan/Extender: [name] Weeks in  Treatment: 0 Compression Therapy Performed for Wound Assessment: Wound #2 Left,Lateral Calcaneus Performed By: Clinician [name], RN Compression Type: Double Layer Post Procedure Diagnosis Same as Pre-procedure Dustin Turner, Dustin Turner ([id number]) [id number] Page 4 of 9 Electronic Signature(s) Signed: 02/04/2023 5:02:42 PM By: [name] Entered By: [name] on 02/04/2023 15:24:20 -------------------------------------------------------------------------------- Lower Extremity Assessment Details Patient Name: Date of Service: Dustin Turner, Dustin D. 02/04/2023 2:00 PM Medical Record Number: [id number] Patient Account Number: [id number] Date of Birth/Sex: Treating RN: September 17, 1935 (88 y.o. [name] Primary Care Tieshia Rettinger: Smiley Houseman Other Clinician: Referring Toben Acuna: Treating Yaroslav Gombos/Extender: [name] Weeks in Treatment: 0 Edema Assessment Assessed: [Left: No] [Right: No] Edema: [Left: Ye] [Right: s] Calf Left: Right: Point of Measurement: 32 cm From Medial Instep 38.5 cm Ankle Left: Right: Point of Measurement: 12 cm From Medial Instep 22.5 cm Vascular Assessment Pulses: Dorsalis Pedis Palpable: [Left:Yes] Extremity colors, hair growth, and conditions: Extremity Color: [Left:Hyperpigmented] Hair Growth on Extremity: [Left:No] Temperature of Extremity: [Left:Warm < 3 seconds] Toe Nail Assessment Left: Right: Thick: Yes Discolored: No Deformed: Yes Improper Length and Hygiene: No Electronic Signature(s) Signed: 02/04/2023 5:02:42 PM By: [name] Entered By: [name] on 02/04/2023 14:30:03 Przybylski, Dustin Turner ([id number]) [id number] Page 5 of 9 -------------------------------------------------------------------------------- Multi Wound Chart Details Patient Name: Date of Service: Dustin Turner, Dustin D. 02/04/2023 2:00 PM Medical Record Number: [id number] Patient Account Number:  [id number] Date of Birth/Sex: Treating RN: Sep 26, 1935 (88 y.o. 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 Primary Etiology: Cataracts, Anemia, Sleep Apnea, Cataracts, Anemia, Sleep Apnea, N/A Comorbid History: Osteoarthritis, Dementia, Seizure Osteoarthritis, Dementia, Seizure Disorder Disorder 01/29/2022 01/29/2022 N/A Date Acquired: 0 0 N/A Weeks of Treatment: Open Open N/A Wound Status: No No N/A Wound Recurrence: 0.5x0.4x0.1 0.5x1.9x0.1 N/A Measurements L x W x D (cm) 0.157 0.746 N/A A (cm) : rea 0.016 0.075 N/A Volume (cm) : Full Thickness Without Exposed Full Thickness Without Exposed N/A Classification: Support Structures Support Structures Small Medium N/A Exudate Amount: Serous Serous N/A Exudate Type: Media planner  N/A Exudate Color: Medium (34-66%) Small (1-33%) N/A Granulation Amount: Pink Pink, Pale N/A Granulation Quality: Medium (34-66%) Medium (34-66%) N/A Necrotic Amount: Fat Layer (Subcutaneous Tissue): Yes Fat Layer (Subcutaneous Tissue): Yes N/A Exposed Structures: Fascia: No Fascia: No Tendon: No Tendon: No Muscle: No Muscle: No Joint: No Joint: No Bone: No Bone: No Medium (34-66%) Medium (34-66%) N/A Epithelialization: Treatment Notes Electronic Signature(s) Signed: 02/04/2023 5:02:42 PM By: [name] Entered By: [name] on 02/04/2023 15:10:29 Dustin Turner, Dustin Turner ([id number])  [id number] Page 6 of 9 -------------------------------------------------------------------------------- Pain Assessment Details Patient Name: Date of Service: Dustin Turner, Dustin D. 02/04/2023 2:00 PM Medical Record Number: [id number] Patient Account Number: [id number] Date of Birth/Sex: Treating RN: 12-15-1935 (88 y.o. [name] Primary Care Malessa Zartman: Smiley Houseman Other Clinician: Referring Terryon Pineiro: Treating Tyesha Joffe/Extender: [name] Weeks in Treatment: 0 Active Problems Location of Pain Severity and Description of Pain Patient Has Paino Yes Site Locations Rate the pain. Current Pain Level: 5 Pain Management and Medication Current Pain Management: Electronic Signature(s) Signed: 02/04/2023 5:02:42 PM By: [name] Entered By: [name] on 02/04/2023 14:23:44 -------------------------------------------------------------------------------- Wound Assessment Details Patient Name: Date of Service: Dustin Turner, Dustin HN D. 02/04/2023 2:00 PM Medical Record Number: [id number] Patient Account Number: [id number] Date of Birth/Sex: Treating RN: 1935-08-14 (88 y.o. [name] Primary Care Naveya Ellerman: Smiley Houseman Other Clinician: Referring Tecumseh Yeagley: Treating Ameira Alessandrini/Extender: [name] Weeks in Treatment: [address] [place] ([id number]) [id number] Page 7 of 9 Wound Status Wound Number: 1 Primary Neuropathic Ulcer-Non Diabetic Etiology: Wound Location: Left, Distal, Dorsal Foot Wound Status: Open Wounding Event: Gradually Appeared Comorbid Cataracts, Anemia, Sleep Apnea, Osteoarthritis, Dementia, Date Acquired: 01/29/2022 History: Seizure Disorder Weeks Of Treatment: 0 Clustered Wound: No Photos Wound Measurements Length: (cm) 0.5 Width: (cm) 0.4 Depth: (cm) 0.1 Area: (cm) 0.157 Volume: (cm) 0.016 % Reduction in Area: % Reduction in Volume: Epithelialization: Medium  (34-66%) Wound Description Classification: Full Thickness Without Exposed Suppor Exudate Amount: Small Exudate Type: Serous Exudate Color: amber t Structures Foul Odor After Cleansing: No Slough/Fibrino Yes Wound Bed Granulation Amount: Medium (34-66%) Exposed Structure Granulation Quality: Pink Fascia Exposed: No Necrotic Amount: Medium (34-66%) Fat Layer (Subcutaneous Tissue) Exposed: Yes Necrotic Quality: Adherent Slough Tendon Exposed: No Muscle Exposed: No Joint Exposed: No Bone Exposed: No Electronic Signature(s) Signed: 02/04/2023 3:08:28 PM By: Allen Derry PA-C Signed: 02/04/2023 5:02:42 PM By: [name] Entered By: Allen Derry on 02/04/2023 15:08:28 -------------------------------------------------------------------------------- Wound Assessment Details Patient Name: Date of Service: Dustin Barrio HN D. 02/04/2023 2:00 PM Medical Record Number: [id number] Patient Account Number: [id number] Date of Birth/Sex: Treating RN: 1935/12/07 (88 y.o. [name] Primary Care Keilah Lemire: Smiley Houseman Other Clinician: Referring Lindsey Hommel: Treating Eura Radabaugh/Extender: [name] Weeks in Treatment: 0 Wound Status Dustin Turner, Dustin Turner ([id number]) [id number] Page 8 of 9 Wound Number: 2 Primary Neuropathic Ulcer-Non Diabetic Etiology: Wound Location: Left, Lateral Calcaneus Wound Status: Open Wounding Event: Gradually Appeared Comorbid Cataracts, Anemia, Sleep Apnea, Osteoarthritis, Dementia, Date Acquired: 01/29/2022 History: Seizure Disorder Weeks Of Treatment: 0 Clustered Wound: No Photos Wound Measurements Length: (cm) 0.5 Width: (cm) 1.9 Depth: (cm) 0.1 Area: (cm) 0.746 Volume: (cm) 0.075 % Reduction in Area: % Reduction in Volume: Epithelialization: Medium (34-66%) Wound Description Classification: Full Thickness Without Exposed Suppor Exudate Amount: Medium Exudate Type: Serous Exudate Color: amber t Structures  Foul Odor After Cleansing: No Slough/Fibrino No Wound Bed Granulation Amount: Small (1-33%) Exposed Structure Granulation Quality: Pink, Pale Fascia Exposed: No Necrotic Amount: Medium (34-66%) Fat Layer (Subcutaneous Tissue)  Exposed: Yes Necrotic Quality: Adherent Slough Tendon Exposed: No Muscle Exposed: No Joint Exposed: No Bone Exposed: No Electronic Signature(s) Signed: 02/04/2023 3:09:05 PM By: Allen Derry PA-C Signed: 02/04/2023 5:02:42 PM By: [name] Entered By: Allen Derry on 02/04/2023 15:09:05 -------------------------------------------------------------------------------- Vitals Details Patient Name: Date of Service: Dustin Barrio HN D. 02/04/2023 2:00 PM Medical Record Number: [id number] Patient Account Number: [id number] Date of Birth/Sex: Treating RN: [birth year] (88 y.o. [name] Primary Care Amoy Steeves: Smiley Houseman Other Clinician: Referring Aiyden Lauderback: Treating Margene Cherian/Extender: Herbert Spires, Efraim Kaufmann Weeks in Treatment: 0 Vital Signs Time Taken: 14:20 Temperature (F): 97.6 Dustin Turner, JALONNIE PAWELSKI ([id number]) [id number] Page 9 of 9 Height (in): 65 Pulse (bpm): 77 Source: Stated Respiratory Rate (breaths/min): 18 Weight (lbs): 185 Blood Pressure (mmHg): 135/73 Source: Stated Reference Range: 80 - 120 mg / dl Body Mass Index (BMI): 30.8 Electronic Signature(s) Signed: 02/04/2023 5:02:42 PM By: [name] Entered By: [name] on 02/04/2023 14:24:17

## 2023-02-12 ENCOUNTER — Encounter: Payer: Medicare HMO | Admitting: Physician Assistant

## 2023-02-12 DIAGNOSIS — L97522 Non-pressure chronic ulcer of other part of left foot with fat layer exposed: Secondary | ICD-10-CM | POA: Diagnosis not present

## 2023-02-20 ENCOUNTER — Encounter: Payer: Medicare HMO | Attending: Physician Assistant | Admitting: Physician Assistant

## 2023-02-20 DIAGNOSIS — G603 Idiopathic progressive neuropathy: Secondary | ICD-10-CM | POA: Insufficient documentation

## 2023-02-20 DIAGNOSIS — I87302 Chronic venous hypertension (idiopathic) without complications of left lower extremity: Secondary | ICD-10-CM | POA: Diagnosis not present

## 2023-02-20 DIAGNOSIS — Z09 Encounter for follow-up examination after completed treatment for conditions other than malignant neoplasm: Secondary | ICD-10-CM | POA: Diagnosis present

## 2023-02-20 DIAGNOSIS — F01A0 Vascular dementia, mild, without behavioral disturbance, psychotic disturbance, mood disturbance, and anxiety: Secondary | ICD-10-CM | POA: Diagnosis not present

## 2023-02-20 DIAGNOSIS — Z872 Personal history of diseases of the skin and subcutaneous tissue: Secondary | ICD-10-CM | POA: Insufficient documentation

## 2023-07-18 NOTE — Progress Notes (Unsigned)
 [facility] [address] [place], [place] [address]  Pulmonary Sleep Medicine   Office Visit Note  Patient Name: Dustin Turner DOB: 1935/10/08 MRN [id number]    Chief Complaint: Obstructive Sleep Apnea visit  Brief History:  Dustin Turner is seen today for a follow up visit for BiPAP auto@ IPAP max 11, EPAP min 5, PS 4 cmH2O. The patient has a 8 year history of sleep apnea. Patient is using PAP nightly.  The patient feels rested after sleeping with PAP.  The patient reports benefiting from PAP use. Reported sleepiness is  improved and the Epworth Sleepiness Score is 4 out of 24. The patient does not take naps. The patient complains of the following: daytime sleepiness.   The compliance download shows 98% compliance with an average use time of 7 hours 21 minutes. The AHI is 11.3. The patient does not complain of limb movements disrupting sleep. The patient continues to require PAP therapy in order to eliminate sleep apnea.  ROS  General: (-) fever, (-) chills, (-) night sweat Nose and Sinuses: (-) nasal stuffiness or itchiness, (-) postnasal drip, (-) nosebleeds, (-) sinus trouble. Mouth and Throat: (-) sore throat, (-) hoarseness. Neck: (-) swollen glands, (-) enlarged thyroid , (-) neck pain. Respiratory: - cough, - shortness of breath, - wheezing. Neurologic: - numbness, - tingling. Psychiatric: - anxiety, - depression   Current Medication: Outpatient Encounter Medications as of 07/22/2023  Medication Sig   acetaminophen  (TYLENOL ) 500 MG tablet Take 1,000 mg by mouth every 6 (six) hours as needed.   alendronate (FOSAMAX) 70 MG tablet Take 70 mg by mouth once a week. On Thursday   alum & mag hydroxide-simeth (MAALOX PLUS) 400-400-40 MG/5ML suspension Take 15 mLs by mouth every 4 (four) hours as needed. Give 30 ml by mouth daily with meals as needed   ammonium lactate (LAC-HYDRIN) 12 % lotion Apply 1 application topically 2 (two) times daily as needed.    amoxicillin  (AMOXIL ) 250  MG capsule Take by mouth.   Artificial Saliva (BIOTENE ORALBALANCE DRY MOUTH) GEL Use as directed 1 application in the mouth or throat daily as needed. Apply a pea size amount to mucous membrane for dry mouth   calcium -vitamin D  (OSCAL WITH D) 500-200 MG-UNIT tablet Take 1 tablet by mouth 2 (two) times daily.    capsicum (ZOSTRIX) 0.075 % topical cream Apply 1 application. topically 3 (three) times daily.   carbamide peroxide (DEBROX) 6.5 % OTIC solution 5 drops 2 (two) times daily.   carbamide peroxide (GLY-OXIDE) 10 % solution Place 1 Application onto teeth daily.   diclofenac  Sodium (VOLTAREN  ARTHRITIS PAIN) 1 % GEL Apply 2 g topically 1 day or 1 dose.   DULoxetine  (CYMBALTA ) 60 MG capsule Take 60 mg by mouth daily.   DULoxetine  HCl 60 MG CSDR Take 1 capsule by mouth at bedtime.   finasteride  (PROSCAR ) 5 MG tablet Take 5 mg by mouth daily.   gabapentin  (NEURONTIN ) 800 MG tablet Take 800 mg by mouth daily.   guaiFENesin (ROBITUSSIN) 100 MG/5ML liquid Take 10 mLs by mouth every 6 (six) hours as needed for cough or to loosen phlegm.   hypromellose (SYSTANE OVERNIGHT THERAPY) 0.3 % GEL ophthalmic ointment Place 1 application. into both eyes at bedtime.   Infant Care Products [facility] EX) Apply 1 application. topically 3 (three) times daily. Apply to penile area   ipratropium (ATROVENT) 0.06 % nasal spray Place 2 sprays into both nostrils 3 (three) times daily as needed for rhinitis.   Lysine  1000 MG TABS Take 1 tablet by mouth every other day.   Magnesium  250 MG TABS Take 1 tablet by mouth daily.   MOVANTIK 25 MG TABS tablet Take by mouth.   mupirocin  ointment (BACTROBAN ) 2 % Apply 1 application. topically 2 (two) times daily.   naloxone (NARCAN) nasal spray 4 mg/0.1 mL Place into the nose.   ondansetron  (ZOFRAN ) 4 MG tablet Take 4 mg by mouth every 6 (six) hours as needed for nausea or vomiting.   oxyCODONE (OXY IR/ROXICODONE) 5 MG immediate release tablet Take 5 mg by mouth every 12 (twelve)  hours as needed for severe pain.   polyethylene glycol (MIRALAX  / GLYCOLAX ) packet Take 17 g by mouth daily.   pregabalin  (LYRICA ) 200 MG capsule Take 200 mg by mouth 2 (two) times daily.   psyllium (HYDROCIL/METAMUCIL) 95 % PACK Take 1 packet by mouth daily.   senna-docusate (SENOKOT-S) 8.6-50 MG tablet Take 1 tablet by mouth as directed. Take 1 tablet every morning Take 1 tablet at bedtime on Monday, Wednesday and Friday.   simethicone (MYLICON) 125 MG chewable tablet Chew 125 mg by mouth every 6 (six) hours as needed for flatulence.   tamsulosin  (FLOMAX ) 0.4 MG CAPS capsule Take 0.4 mg by mouth daily.   triamcinolone cream (KENALOG) 0.1 % Apply 1 application  topically 2 (two) times daily.   vitamin C (ASCORBIC ACID) 500 MG tablet Take 500 mg by mouth daily.   No facility-administered encounter medications on file as of 07/22/2023.    Surgical History: Past Surgical History:  Procedure Laterality Date   CARPAL TUNNEL RELEASE Left 03/16/2016   Dr. Ozell Flake   CATARACT EXTRACTION W/PHACO Right 01/04/2020   Procedure: CATARACT EXTRACTION PHACO AND INTRAOCULAR LENS PLACEMENT (IOC) RIGHT;  Surgeon: Myrna Adine Anes, MD;  Location: [facility];  Service: Ophthalmology;  Laterality: Right;  4.77 0:34.4   CATARACT EXTRACTION W/PHACO Left 04/02/2022   Procedure: CATARACT EXTRACTION PHACO AND INTRAOCULAR LENS PLACEMENT (IOC) LEFT  5.20  00:35.8;  Surgeon: Myrna Adine Anes, MD;  Location: [facility];  Service: Ophthalmology;  Laterality: Left;  sleep apnea   CERVICAL SPINE SURGERY     CORNEAL EYE SURGERY Right 09/08/2014   PTK (Dr. Charlanne)   HEMORRHOID SURGERY     HERNIA REPAIR     left side   INTRATHECAL PUMP IMPLANTATION  04/28/2014   Procedure: REVISION INTRATHECAL CATH; Surgeon: Charlie Richardean Dryer, MD; Location: [facility] OR; Service: Anes/ Pain Mgmt; Laterality: N/A;   JOINT REPLACEMENT Bilateral    bilateral knee replacements   LUMBAR SPINE SURGERY     PAIN PUMP  IMPLANTATION     multiple procedures for removal and insertion this is his 3rd pain pump   PAIN PUMP REMOVAL     multiple procedures for removal and insertion this is his 3rd pain pump   Proleive System Treatment  2006   ROTATOR CUFF REPAIR Right    x2   SPINAL CORD STIMULATOR IMPLANT     x2   THORACIC SPINE SURGERY     TONSILLECTOMY      Medical History: Past Medical History:  Diagnosis Date   Abdominal pain    other specified site   Abnormal gait    Advance directive in chart 10/12/2015   Anemia    Arthritis    BPH (benign prostatic hyperplasia)    Cataract    Cervical spondylosis without myelopathy    Cognitive dysfunction    DDD (degenerative disc disease), cervical 06/29/2013  Degeneration of cervical intervertebral disc    Degeneration of lumbar intervertebral disc    Depression    Excessive daytime sleepiness    Generalized osteoarthritis of multiple sites 11/19/2013   GERD (gastroesophageal reflux disease)    Hallux varus, acquired    Hiatal hernia    Hyperlipidemia    Hypertension    (12/30/19 - pt denies)   Hypogonadism in male    Impaired functional mobility, balance, gait, and endurance 11/14/2015   uses motorized wheelchair   Insomnia 06/29/2013   Low back pain    Lumbosacral spondylosis without myelopathy    Memory loss    Mild cognitive impairment    Neurogenic bladder    OP (osteoporosis)    Organic sleep apnea    Osteoporosis 03/15/2004   Peripheral neuropathy    Radial nerve palsy    Scoliosis 06/29/2013   Seizure (HCC) 07/31/2016   Sleep apnea    CPAP to bring DOS sleep study done in [place]   Spinal compression fracture (HCC)    Spinal cord stimulator status    No longer functions, battery died   Spinal stenosis    Spondylolisthesis, acquired    Urinary incontinence, stress, male 08/10/2015   Urinary urgency    Uses hearing aid 10/12/2015   both ears   Uses wheelchair    Venous stasis    Venous stasis dermatitis of both  lower extremities 06/29/2013   Vision abnormalities    Vitamin B12 deficiency 09/27/2016   Wrist drop, bilateral     Family History: Non contributory to the present illness  Social History: Social History   Socioeconomic History   Marital status: Married    Spouse name: Not on file   Number of children: Not on file   Years of education: Not on file   Highest education level: Not on file  Occupational History   Not on file  Tobacco Use   Smoking status: Never   Smokeless tobacco: Never  Vaping Use   Vaping status: Never Used  Substance and Sexual Activity   Alcohol use: No    Alcohol/week: 0.0 standard drinks of alcohol    Comment: history of alcohol addiction, attends regular AA meetings   Drug use: No   Sexual activity: Not on file  Other Topics Concern   Not on file  Social History Narrative   Not on file   Social Drivers of Health   Financial Resource Strain: Low Risk  (03/06/2021)   Received from [facility]   Overall Financial Resource Strain (CARDIA)    Difficulty of Paying Living Expenses: Not hard at all  Food Insecurity: No Food Insecurity (03/06/2021)   Received from [facility]   Hunger Vital Sign    Worried About Running Out of Food in the Last Year: Never true    Ran Out of Food in the Last Year: Never true  Transportation Needs: No Transportation Needs (03/06/2021)   Received from [facility]   PRAPARE - Transportation    Lack of Transportation (Medical): No    Lack of Transportation (Non-Medical): No  Physical Activity: Not on file  Stress: Not on file  Social Connections: Not on file  Intimate Partner Violence: Not on file    Vital Signs: There were no vitals taken for this visit. There is no height or weight on file to calculate BMI.    Examination: General Appearance: The patient is well-developed, well-nourished, and in no distress. Neck Circumference:  38 cm Skin: Gross inspection  of skin unremarkable. Head: normocephalic, no gross deformities. Eyes: no gross deformities noted. ENT: ears appear grossly normal Neurologic: Alert and oriented. No involuntary movements.  STOP BANG RISK ASSESSMENT S (snore) Have you been told that you snore?     YES/N   T (tired) Are you often tired, fatigued, or sleepy during the day?   YES  O (obstruction) Do you stop breathing, choke, or gasp during sleep? NO   P (pressure) Do you have or are you being treated for high blood pressure? YES   B (BMI) Is your body index greater than 35 kg/m? NO   A (age) Are you 22 years old or older? YES   N (neck) Do you have a neck circumference greater than 16 inches?   NO   G (gender) Are you a male? YES   TOTAL STOP/BANG "YES" ANSWERS        A STOP-Bang score of 2 or less is considered low risk, and a score of 5 or more is high risk for having either moderate or severe OSA. For people who score 3 or 4, doctors may need to perform further assessment to determine how likely they are to have OSA.         EPWORTH SLEEPINESS SCALE:  Scale:  (0)= no chance of dozing; (1)= slight chance of dozing; (2)= moderate chance of dozing; (3)= high chance of dozing  Chance  Situtation    Sitting and reading: 0    Watching TV: 0    Sitting Inactive in public: 0    As a passenger in car: 1      Lying down to rest: 0    Sitting and talking: 0    Sitting quielty after lunch: 3    In a car, stopped in traffic: N/a   TOTAL SCORE:   4 out of 24    SLEEP STUDIES:  PSG (04/2015) AHI 7.4/hr, 67% central, min SpO2 87%, recommend BiPAP titration  Titration (05/2016) recommend BiPAP ST@ 17/11 cmH2O, back up RR 10 bpm Titration (11/2021) recommend CPAP@ 10 cmH2O Titration (02/2023) APAP@ 10-18 cmH2O w/ 2 week download Titration (03/2023) BiPAP@ 11/5 cmH2O   CPAP COMPLIANCE DATA:  Date Range: 05/28/2023-07/17/2023  Average Daily Use: 7 hours 21 minutes  Median Use: 7 hours 21  minutes  Compliance for > 4 Hours: 98%  AHI: 11.3 respiratory events per hour  Days Used: 50/51 days  Mask Leak: 53.2  95th Percentile Pressure: 10.8/6.9         LABS: No results found for this or any previous visit (from the past 2160 hours).  Radiology: No results found.  No results found.  No results found.    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/2017   Cerebellar stroke (HCC) 01/01/2017   Knee pain, bilateral 08/31/2016   Seizures (HCC) 07/31/2016   Chronic venous insufficiency 04/09/2016   Impaired functional mobility, balance, gait, and endurance 11/14/2015  Hypogonadism in male 05/27/2015   BPH with obstruction/lower urinary tract symptoms 04/15/2015   Anemia 07/23/2014   Narrowing of intervertebral disc space 07/23/2014   Depression with anxiety 07/23/2014   GERD without esophagitis 07/23/2014   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   Scoliosis 07/23/2014   Compressed spine fracture (HCC) 07/23/2014   Spinal stenosis 07/23/2014   Obstructive apnea 12/08/2013   Generalized OA 11/19/2013   Idiopathic scoliosis and kyphoscoliosis 08/20/2013   Osteoporosis 07/30/2013   Dermatitis, stasis 06/29/2013   Curvature of spine 10/10/2012   Acquired deformities of spine 10/10/2012   Cervical spondylosis without myelopathy 05/03/2011   Cervical osteoarthritis 05/03/2011   DDD (degenerative disc disease), lumbosacral 05/03/2011    Angulation of spine 05/03/2011   Degenerative arthritis of lumbar spine 05/03/2011   Lumbosacral spondylosis 05/03/2011   Back pain, chronic 03/29/2011   Benign fibroma of prostate 03/29/2011   Arthritis 08/07/2010   Constipation due to opioid therapy 08/07/2010   1. Complex sleep apnea syndrome (Primary) The patient does tolerate PAP and reports  benefit from PAP use. His apnea is not controlled, AHI is 11.3.  This is improved since I put him on bipap auto, but still not at target. His leak continues to be a problem. We will increase his max IPAP to 13 and do a 4 week download. If no improvement, he likely will require a bipap asv.  The patient was reminded how to clean equipment and advised to replace supplies routinely.  The compliance is excellent. .   Complex sleep apnea syndrome- not optimally controlled. Will increase max IPAP to 13, and do a 4 week download. F/u 4wks.   2. Encounter for BiPAP use counseling PAP Counseling: had a lengthy discussion with the patient regarding the importance of PAP therapy in management of the sleep apnea. Patient appears to understand the risk factor reduction and also understands the risks associated with untreated sleep apnea. Patient will try to make a good faith effort to remain compliant with therapy. Also instructed the patient on proper cleaning of the device including the water must be changed daily if possible and use of distilled water is preferred. Patient understands that the machine should be regularly cleaned with appropriate recommended cleaning solutions that do not damage the PAP machine for example given white vinegar and water rinses. Other methods such as ozone treatment may not be as good as these simple methods to achieve cleaning.   3. Excessive daytime sleepiness Likely multifactorial, given he is on chronic opiates.     General Counseling: I have discussed the findings of the evaluation and examination with Aum.  I have also  discussed any further diagnostic evaluation thatmay be needed or ordered today. Raudel verbalizes understanding of the findings of todays visit. We also reviewed his medications today and discussed drug interactions and side effects including but not limited excessive drowsiness and altered mental states. We also discussed that there is always a risk not just to him but also people around him. he has been encouraged to call the office with any questions or concerns that should arise related to todays visit.  No orders of the defined types were placed in this encounter.       I have personally obtained a history, examined the patient, evaluated laboratory and imaging results, formulated the assessment and plan and placed orders. This patient was seen today by Lauraine Lay, PA-C in collaboration with Dr. Elfreda Bathe.   Elfreda DELENA Bathe, MD [facility] Diplomate ABMS Pulmonary Critical Care  Medicine and Sleep Medicine

## 2023-07-22 ENCOUNTER — Ambulatory Visit (INDEPENDENT_AMBULATORY_CARE_PROVIDER_SITE_OTHER): Admitting: Internal Medicine

## 2023-07-22 DIAGNOSIS — Z7189 Other specified counseling: Secondary | ICD-10-CM

## 2023-07-22 DIAGNOSIS — G4739 Other sleep apnea: Secondary | ICD-10-CM

## 2023-07-22 DIAGNOSIS — G4719 Other hypersomnia: Secondary | ICD-10-CM

## 2023-07-22 NOTE — Patient Instructions (Signed)

## 2023-09-28 NOTE — Progress Notes (Unsigned)
 [facility] [address] [place], [place] [address]  Pulmonary Sleep Medicine   Office Visit Note  Patient Name: Dustin Turner DOB: [birth year] MRN [id number]    Chief Complaint: Obstructive Sleep Apnea visit  Brief History:  Dustin Turner is seen today for a follow up on BIPAP Auto  IPAP max 11, EPAP min 5, PS 4 cmH2O.  The patient has a 9 year history of sleep apnea. Patient is using PAP nightly.  The patient feels rested after sleeping with PAP.  The patient reports no complaints from PAP use. Reported sleepiness is  improved and the Epworth Sleepiness Score is 5 out of 24. The patient does not  take naps. The patient complains of the following: no complaints.   The compliance download shows 100% compliance with an average use time of 7 hours 19 minutes. The AHI is 8.2  The patient does not complain of limb movements disrupting sleep.  ROS  General: (-) fever, (-) chills, (-) night sweat Nose and Sinuses: (-) nasal stuffiness or itchiness, (-) postnasal drip, (-) nosebleeds, (-) sinus trouble. Mouth and Throat: (-) sore throat, (-) hoarseness. Neck: (-) swollen glands, (-) enlarged thyroid , (-) neck pain. Respiratory: - cough, - shortness of breath, - wheezing. Neurologic: - numbness, - tingling. Psychiatric: - anxiety, - depression   Current Medication: Outpatient Encounter Medications as of 09/30/2023  Medication Sig   alfuzosin (UROXATRAL) 10 MG 24 hr tablet Take 10 mg by mouth daily.   acetaminophen  (TYLENOL ) 500 MG tablet Take 1,000 mg by mouth every 6 (six) hours as needed.   alendronate (FOSAMAX) 70 MG tablet Take 70 mg by mouth once a week. On Thursday   alum & mag hydroxide-simeth (MAALOX PLUS) 400-400-40 MG/5ML suspension Take 15 mLs by mouth every 4 (four) hours as needed. Give 30 ml by mouth daily with meals as needed   ammonium lactate (LAC-HYDRIN) 12 % lotion Apply 1 application topically 2 (two) times daily as needed.    amoxicillin  (AMOXIL ) 250 MG capsule  Take by mouth.   Artificial Saliva (BIOTENE ORALBALANCE DRY MOUTH) GEL Use as directed 1 application in the mouth or throat daily as needed. Apply a pea size amount to mucous membrane for dry mouth   calcium -vitamin D  (OSCAL WITH D) 500-200 MG-UNIT tablet Take 1 tablet by mouth 2 (two) times daily.    capsicum (ZOSTRIX) 0.075 % topical cream Apply 1 application. topically 3 (three) times daily.   carbamide peroxide (DEBROX) 6.5 % OTIC solution 5 drops 2 (two) times daily.   carbamide peroxide (GLY-OXIDE) 10 % solution Place 1 Application onto teeth daily.   diclofenac  Sodium (VOLTAREN  ARTHRITIS PAIN) 1 % GEL Apply 2 g topically 1 day or 1 dose.   DULoxetine  (CYMBALTA ) 60 MG capsule Take 60 mg by mouth daily.   DULoxetine  HCl 60 MG CSDR Take 1 capsule by mouth at bedtime.   finasteride  (PROSCAR ) 5 MG tablet Take 5 mg by mouth daily.   gabapentin  (NEURONTIN ) 800 MG tablet Take 800 mg by mouth daily.   guaiFENesin (ROBITUSSIN) 100 MG/5ML liquid Take 10 mLs by mouth every 6 (six) hours as needed for cough or to loosen phlegm.   hypromellose (SYSTANE OVERNIGHT THERAPY) 0.3 % GEL ophthalmic ointment Place 1 application. into both eyes at bedtime.   Inf[employer] [facility] EX) Apply 1 application. topically 3 (three) times daily. Apply to penile area   ipratropium (ATROVENT) 0.06 % nasal spray Place 2 sprays into both nostrils 3 (three) times daily as  needed for rhinitis.   lactulose (CHRONULAC) 10 GM/15ML solution SMARTSIG:Milliliter(s) By Mouth   lactulose, encephalopathy, (CHRONULAC) 10 GM/15ML SOLN    Lysine 1000 MG TABS Take 1 tablet by mouth every other day.   Magnesium  250 MG TABS Take 1 tablet by mouth daily.   MOVANTIK 25 MG TABS tablet Take by mouth.   mupirocin  ointment (BACTROBAN ) 2 % Apply 1 application. topically 2 (two) times daily.   naloxone (NARCAN) nasal spray 4 mg/0.1 mL Place into the nose.   ondansetron  (ZOFRAN ) 4 MG tablet Take 4 mg by mouth every 6 (six) hours as  needed for nausea or vomiting.   oxyCODONE (OXY IR/ROXICODONE) 5 MG immediate release tablet Take 5 mg by mouth every 12 (twelve) hours as needed for severe pain.   polyethylene glycol (MIRALAX  / GLYCOLAX ) packet Take 17 g by mouth daily.   pregabalin  (LYRICA ) 200 MG capsule Take 200 mg by mouth 2 (two) times daily.   Prucalopride Succinate 2 MG TABS Take 2 mg by mouth.   psyllium (HYDROCIL/METAMUCIL) 95 % PACK Take 1 packet by mouth daily.   senna-docusate (SENOKOT-S) 8.6-50 MG tablet Take 1 tablet by mouth as directed. Take 1 tablet every morning Take 1 tablet at bedtime on Monday, Wednesday and Friday.   simethicone (MYLICON) 125 MG chewable tablet Chew 125 mg by mouth every 6 (six) hours as needed for flatulence.   tamsulosin  (FLOMAX ) 0.4 MG CAPS capsule Take 0.4 mg by mouth daily.   triamcinolone cream (KENALOG) 0.1 % Apply 1 application  topically 2 (two) times daily.   vitamin C (ASCORBIC ACID) 500 MG tablet Take 500 mg by mouth daily.   No facility-administered encounter medications on file as of 09/30/2023.    Surgical History: Past Surgical History:  Procedure Laterality Date   CARPAL TUNNEL RELEASE Left 03/16/2016   Dr. Ozell Flake   CATARACT EXTRACTION W/PHACO Right 01/04/2020   Procedure: CATARACT EXTRACTION PHACO AND INTRAOCULAR LENS PLACEMENT (IOC) RIGHT;  Surgeon: Myrna Adine Anes, MD;  Location: [facility];  Service: Ophthalmology;  Laterality: Right;  4.77 0:34.4   CATARACT EXTRACTION W/PHACO Left 04/02/2022   Procedure: CATARACT EXTRACTION PHACO AND INTRAOCULAR LENS PLACEMENT (IOC) LEFT  5.20  00:35.8;  Surgeon: Myrna Adine Anes, MD;  Location: [facility];  Service: Ophthalmology;  Laterality: Left;  sleep apnea   CERVICAL SPINE SURGERY     CORNEAL EYE SURGERY Right 09/08/2014   PTK (Dr. Charlanne)   HEMORRHOID SURGERY     HERNIA REPAIR     left side   INTRATHECAL PUMP IMPLANTATION  04/28/2014   Procedure: REVISION INTRATHECAL CATH; Surgeon:  Charlie Richardean Dryer, MD; Location: [facility]; Service: Anes/ Pain Mgmt; Laterality: N/A;   JOINT REPLACEMENT Bilateral    bilateral knee replacements   LUMBAR SPINE SURGERY     PAIN PUMP IMPLANTATION     multiple procedures for removal and insertion this is his 3rd pain pump   PAIN PUMP REMOVAL     multiple procedures for removal and insertion this is his 3rd pain pump   Proleive System Treatment  2006   ROTATOR CUFF REPAIR Right    x2   SPINAL CORD STIMULATOR IMPLANT     x2   THORACIC SPINE SURGERY     TONSILLECTOMY      Medical History: Past Medical History:  Diagnosis Date   Abdominal pain    other specified site   Abnormal gait    Advance directive in chart 10/12/2015   Anemia  Arthritis    BPH (benign prostatic hyperplasia)    Cataract    Cervical spondylosis without myelopathy    Cognitive dysfunction    DDD (degenerative disc disease), cervical 06/29/2013   Degeneration of cervical intervertebral disc    Degeneration of lumbar intervertebral disc    Depression    Excessive daytime sleepiness    Generalized osteoarthritis of multiple sites 11/19/2013   GERD (gastroesophageal reflux disease)    Hallux varus, acquired    Hiatal hernia    Hyperlipidemia    Hypertension    (12/30/19 - pt denies)   Hypogonadism in male    Impaired functional mobility, balance, gait, and endurance 11/14/2015   uses motorized wheelchair   Insomnia 06/29/2013   Low back pain    Lumbosacral spondylosis without myelopathy    Memory loss    Mild cognitive impairment    Neurogenic bladder    OP (osteoporosis)    Organic sleep apnea    Osteoporosis 03/15/2004   Peripheral neuropathy    Radial nerve palsy    Scoliosis 06/29/2013   Seizure (HCC) 07/31/2016   Sleep apnea    CPAP to bring DOS sleep study done in [place]   Spinal compression fracture (HCC)    Spinal cord stimulator status    No longer functions, battery died   Spinal stenosis    Spondylolisthesis,  acquired    Urinary incontinence, stress, male 08/10/2015   Urinary urgency    Uses hearing aid 10/12/2015   both ears   Uses wheelchair    Venous stasis    Venous stasis dermatitis of both lower extremities 06/29/2013   Vision abnormalities    Vitamin B12 deficiency 09/27/2016   Wrist drop, bilateral     Family History: Non contributory to the present illness  Social History: Social History   Socioeconomic History   Marital status: Married    Spouse name: Not on file   Number of children: Not on file   Years of education: Not on file   Highest education level: Not on file  Occupational History   Not on file  Tobacco Use   Smoking status: Never   Smokeless tobacco: Never  Vaping Use   Vaping status: Never Used  Substance and Sexual Activity   Alcohol use: No    Alcohol/week: 0.0 standard drinks of alcohol    Comment: history of alcohol addiction, attends regular AA meetings   Drug use: No   Sexual activity: Not on file  Other Topics Concern   Not on file  Social History Narrative   Not on file   Social Drivers of Health   Financial Resource Strain: Low Risk  (03/06/2021)   Received from [facility]   Overall Financial Resource Strain (CARDIA)    Difficulty of Paying Living Expenses: Not hard at all  Food Insecurity: No Food Insecurity (03/06/2021)   Received from [facility]   Hunger Vital Sign    Within the past 12 months, you worried that your food would run out before you got the money to buy more.: Never true    Within the past 12 months, the food you bought just didn't last and you didn't have money to get more.: Never true  Transportation Needs: No Transportation Needs (03/06/2021)   Received from [facility]   PRAPARE - Transportation    Lack of Transportation (Medical): No    Lack of Transportation (Non-Medical): No  Physical Activity: Not on file  Stress: Not on file  Social Connections: Not on  file  Intimate Partner Violence: Not on file    Vital Signs: There were no vitals taken for this visit. There is no height or weight on file to calculate BMI.    Examination: General Appearance: The patient is well-developed, well-nourished, and in no distress. Neck Circumference: 38 cm Skin: Gross inspection of skin unremarkable. Head: normocephalic, no gross deformities. Eyes: no gross deformities noted. ENT: ears appear grossly normal Neurologic: Alert and oriented. No involuntary movements.  STOP BANG RISK ASSESSMENT S (snore) Have you been told that you snore?     YES/NO   T (tired) Are you often tired, fatigued, or sleepy during the day?   YES/NO  O (obstruction) Do you stop breathing, choke, or gasp during sleep? YES/NO   P (pressure) Do you have or are you being treated for high blood pressure? YES   B (BMI) Is your body index greater than 35 kg/m? YES/NO   A (age) Are you 56 years old or older? YES   N (neck) Do you have a neck circumference greater than 16 inches?   YES/NO   G (gender) Are you a male? YES   TOTAL STOP/BANG "YES" ANSWERS        A STOP-Bang score of 2 or less is considered low risk, and a score of 5 or more is high risk for having either moderate or severe OSA. For people who score 3 or 4, doctors may need to perform further assessment to determine how likely they are to have OSA.         EPWORTH SLEEPINESS SCALE:  Scale:  (0)= no chance of dozing; (1)= slight chance of dozing; (2)= moderate chance of dozing; (3)= high chance of dozing  Chance  Situtation    Sitting and reading: 3    Watching TV: 0    Sitting Inactive in public: 0    As a passenger in car: 0      Lying down to rest: 0    Sitting and talking: 0    Sitting quielty after lunch: 2    In a car, stopped in traffic: 0   TOTAL SCORE:   5 out of 24    SLEEP STUDIES:  PSG PSG (04/2015) AHI 7.4/hr, 67% central, min SpO2 87%, recommend BiPAP titration Titration  (05/2016) recommend BiPAP ST@ 17/11 cmH2O, back up RR 10 bpm. Titration (11/2021) recommend CPAP@ 10 cmH2O Titration (02/2023) APAP@ 10-18 cmH2O w/ 2 week download Titration (03/2023) BiPAP@ 11/5 cmH2O   CPAP COMPLIANCE DATA:  Date Range: 08/28/2023 - 09/26/2023  Average Daily Use: 7 hours 19 minutes  Median Use: 7 hours 14 minutes  Compliance for > 4 Hours: 100% days  AHI: 8.2 respiratory events per hour  Days Used: 30/30  Mask Leak: 16.8  95th Percentile Pressure: BiPAP auto@ IPAP max 11, EPAP min 5, PS 4 cmH2O.         LABS: No results found for this or any previous visit (from the past 2160 hours).  Radiology: No results found.  No results found.  No results found.    Assessment and Plan: Patient Active Problem List   Diagnosis Date Noted   Encounter for BiPAP use counseling 07/22/2023   Excessive daytime sleepiness 06/18/2022   Complex sleep apnea syndrome 10/23/2021   E coli bacteremia 06/22/2021   Severe sepsis (HCC) 06/21/2021   AKI (acute kidney injury) (HCC) 06/21/2021   UTI (urinary tract infection) due to urinary  indwelling Foley catheter (HCC) 06/21/2021   OSA on CPAP 05/23/2020   CPAP use counseling 05/23/2020   Chronic pain syndrome 10/26/2017   Presence of intrathecal pump 06/27/2017   OAB (overactive bladder) 04/05/2017   Dural venous sinus thrombosis 03/15/2017   Cerebellar stroke (HCC) 01/01/2017   Knee pain, bilateral 08/31/2016   Seizures (HCC) 07/31/2016   Chronic venous insufficiency 04/09/2016   Impaired functional mobility, balance, gait, and endurance 11/14/2015   Hypogonadism in male 05/27/2015   BPH with obstruction/lower urinary tract symptoms 04/15/2015   Anemia 07/23/2014   Narrowing of intervertebral disc space 07/23/2014   Depression with anxiety 07/23/2014   GERD without esophagitis 07/23/2014   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   Scoliosis 07/23/2014   Compressed spine fracture (HCC) 07/23/2014   Spinal stenosis  07/23/2014   Obstructive apnea 12/08/2013   Generalized OA 11/19/2013   Idiopathic scoliosis and kyphoscoliosis 08/20/2013   Osteoporosis 07/30/2013   Dermatitis, stasis 06/29/2013   Curvature of spine 10/10/2012   Acquired deformities of spine 10/10/2012   Cervical spondylosis without myelopathy 05/03/2011   Cervical osteoarthritis 05/03/2011   DDD (degenerative disc disease), lumbosacral 05/03/2011   Angulation of spine 05/03/2011   Degenerative arthritis of lumbar spine 05/03/2011   Lumbosacral spondylosis 05/03/2011   Back pain, chronic 03/29/2011   Benign fibroma of prostate 03/29/2011   Arthritis 08/07/2010   Constipation due to opioid therapy 08/07/2010   1. Complex sleep apnea syndrome (Primary) The patient does tolerate PAP and reports  benefit from PAP use. His apnea is much better controlled now that the leak is controlled, and that his max IPAP was increased. His AHI is still above target at 8.2, so I will try increasing the max IPAP to 14 and doing a one month download.  The patient was reminded how to clean equipment and advised to replace supplies routinely. The compliance is excellent. .   Complex sleep apnea- improved control on bipap auto. Continue with excellent compliance. Increase max IPAP to 14.   2. Encounter for BiPAP use counseling PAP Counseling: had a lengthy discussion with the patient regarding the importance of PAP therapy in management of the sleep apnea. Patient appears to understand the risk factor reduction and also understands the risks associated with untreated sleep apnea. Patient will try to make a good faith effort to remain compliant with therapy. Also instructed the patient on proper cleaning of the device including the water must be changed daily if possible and use of distilled water is preferred. Patient understands that the machine should be regularly cleaned with appropriate recommended cleaning solutions that do not damage the PAP machine for  example given white vinegar and water rinses. Other methods such as ozone treatment may not be as good as these simple methods to achieve cleaning.      General Counseling: I have discussed the findings of the evaluation and examination with Dustin Turner.  I have also discussed any further diagnostic evaluation thatmay be needed or ordered today. Dustin Turner verbalizes understanding of the findings of todays visit. We also reviewed his medications today and discussed drug interactions and side effects including but not limited excessive drowsiness and altered mental states. We also discussed that there is always a risk not just to him but also people around him. he has been encouraged to call the office with any questions or concerns that should arise related to todays visit.  No orders of the defined types were placed in this encounter.       I  have personally obtained a history, examined the patient, evaluated laboratory and imaging results, formulated the assessment and plan and placed orders. This patient was seen today by Lauraine Lay, PA-C in collaboration with Dr. Elfreda Bathe.   Elfreda DELENA Bathe, MD [facility] Diplomate ABMS Pulmonary Critical Care Medicine and Sleep Medicine

## 2023-09-30 ENCOUNTER — Ambulatory Visit (INDEPENDENT_AMBULATORY_CARE_PROVIDER_SITE_OTHER): Admitting: Internal Medicine

## 2023-09-30 DIAGNOSIS — Z7189 Other specified counseling: Secondary | ICD-10-CM | POA: Diagnosis not present

## 2023-09-30 DIAGNOSIS — G4739 Other sleep apnea: Secondary | ICD-10-CM

## 2023-09-30 NOTE — Patient Instructions (Signed)

## 2023-10-04 ENCOUNTER — Emergency Department
Admission: EM | Admit: 2023-10-04 | Discharge: 2023-10-05 | Disposition: A | Discharge status: Home / Self Care | Attending: Emergency Medicine | Admitting: Emergency Medicine

## 2023-10-04 ENCOUNTER — Other Ambulatory Visit: Payer: Self-pay

## 2023-10-04 DIAGNOSIS — T839XXA Unspecified complication of genitourinary prosthetic device, implant and graft, initial encounter: Secondary | ICD-10-CM

## 2023-10-04 DIAGNOSIS — Y846 Urinary catheterization as the cause of abnormal reaction of the patient, or of later complication, without mention of misadventure at the time of the procedure: Secondary | ICD-10-CM | POA: Insufficient documentation

## 2023-10-04 DIAGNOSIS — T83098A Other mechanical complication of other indwelling urethral catheter, initial encounter: Secondary | ICD-10-CM | POA: Diagnosis present

## 2023-10-04 NOTE — ED Triage Notes (Signed)
 Patient states his foley is leaking urine around his meatus; has had foley for three years and has it changed monthly.

## 2023-10-04 NOTE — ED Provider Notes (Signed)
 [facility] Emergency Department Provider Note     Event Date/Time   First MD Initiated Contact with Patient 10/04/23 2123     (approximate)   History   Urinary Catheter Problem   HPI  Dustin Turner is a 88 y.o. male with a chronic indwelling Foley catheter secondary to BPH, presents endorsing Foley catheter dysfunction.  Patient reports his Foley has been leaking around his meatus for the last month.  He had the Foley catheter changed last week as well as last month and continues to endorse the same problem.  The nurse at his facility suggested that he go from a 46 [place] to an 79 [place] Foley to help with leakage.  Patient denies any other acute complaints at this time.  Physical Exam   Triage Vital Signs: ED Triage Vitals [10/04/23 1900]  Encounter Vitals Group     BP (!) 156/89     Girls Systolic BP Percentile      Girls Diastolic BP Percentile      Boys Systolic BP Percentile      Boys Diastolic BP Percentile      Pulse Rate 89     Resp 20     Temp 97.9 F (36.6 C)     Temp Source Oral     SpO2 100 %     Weight      Height      Head Circumference      Peak Flow      Pain Score 0     Pain Loc      Pain Education      Exclude from Growth Chart     Most recent vital signs: Vitals:   10/04/23 1900 10/04/23 2335  BP: (!) 156/89 (!) 170/93  Pulse: 89 73  Resp: 20 16  Temp: 97.9 F (36.6 C) 97.8 F (36.6 C)  SpO2: 100% 98%    General Awake, no distress. NAD HEENT NCAT. PERRL. EOMI. No rhinorrhea. Mucous membranes are moist.  CV:  Good peripheral perfusion.  RESP:  Normal effort.  ABD:  No distention.    ED Results / Procedures / Treatments   Labs (all labs ordered are listed, but only abnormal results are displayed) Labs Reviewed - No data to display   EKG   RADIOLOGY   No results found.   PROCEDURES:  Critical Care performed: No  Procedures   MEDICATIONS ORDERED IN ED: Medications - No data to  display   IMPRESSION / MDM / ASSESSMENT AND PLAN / ED COURSE  I reviewed the triage vital signs and the nursing notes.                              Differential diagnosis includes, but is not limited to, Foley catheter dysfunction, Foley catheter dislodgment, replace Foley catheter  Patient's presentation is most consistent with acute complicated illness / injury requiring diagnostic workup.  Patient's diagnosis is consistent with Foley catheter dysfunction.  Patient reports a functional Foley catheter with some leakage around the meatus.  This is the second Foley catheter placed by his facility in the last month.  Patient presents in no acute distress, requesting a replacement to a larger size catheter.  Patient is to follow up with his PCP or urologist as needed or otherwise directed. Patient is given ED precautions to return to the ED for any worsening or new symptoms.  FINAL CLINICAL IMPRESSION(S) / ED  DIAGNOSES   Final diagnoses:  Foley catheter problem, initial encounter [facility])     Rx / DC Orders   ED Discharge Orders     None        Note:  This document was prepared using Dragon voice recognition software and may include unintentional dictation errors.    Loyd Candida LULLA Aldona, PA-C 10/05/23 0020    Ernest Ronal BRAVO, MD 10/07/23 [phone]

## 2023-10-04 NOTE — ED Notes (Signed)
 Pt states he has been having problems with his catheter leaking and has been told that he potentially needs a bigger size.   Contacted supplies with regards to obtaining a 63fr catheter.

## 2023-10-05 ENCOUNTER — Emergency Department
Admission: EM | Admit: 2023-10-05 | Discharge: 2023-10-05 | Disposition: A | Discharge status: Home / Self Care | Attending: Emergency Medicine | Admitting: Emergency Medicine

## 2023-10-05 ENCOUNTER — Other Ambulatory Visit: Payer: Self-pay

## 2023-10-05 DIAGNOSIS — T83011A Breakdown (mechanical) of indwelling urethral catheter, initial encounter: Secondary | ICD-10-CM

## 2023-10-05 DIAGNOSIS — Y732 Prosthetic and other implants, materials and accessory gastroenterology and urology devices associated with adverse incidents: Secondary | ICD-10-CM | POA: Insufficient documentation

## 2023-10-05 DIAGNOSIS — R339 Retention of urine, unspecified: Secondary | ICD-10-CM | POA: Insufficient documentation

## 2023-10-05 DIAGNOSIS — T83098A Other mechanical complication of other indwelling urethral catheter, initial encounter: Secondary | ICD-10-CM | POA: Insufficient documentation

## 2023-10-05 DIAGNOSIS — Z466 Encounter for fitting and adjustment of urinary device: Secondary | ICD-10-CM

## 2023-10-05 MED ORDER — AMOXICILLIN 500 MG PO CAPS: 500.0000 mg | ORAL_CAPSULE | Freq: Two times a day (BID) | ORAL | 0 refills | Status: AC

## 2023-10-05 MED ORDER — AMOXICILLIN-POT CLAVULANATE 875-125 MG PO TABS
1.0000 | ORAL_TABLET | Freq: Once | ORAL | Status: AC
  Administered 2023-10-05: 1 via ORAL
  Filled 2023-10-05: qty 1

## 2023-10-05 MED ORDER — AMOXICILLIN 500 MG PO CAPS
500.0000 mg | ORAL_CAPSULE | Freq: Once | ORAL | Status: AC
  Administered 2023-10-05: 500 mg via ORAL
  Filled 2023-10-05: qty 1

## 2023-10-05 NOTE — ED Provider Notes (Signed)
 [facility] Provider Note   Event Date/Time   First MD Initiated Contact with Patient 10/05/23 1137     (approximate) History  foley fell out  HPI Dustin Turner is a 88 y.o. male with past medical history of urinary retention who presents after Foley catheter malfunction.  Patient states that his Foley catheter came out overnight and believes that the balloon was not inflated.  Patient states that he has been dealing with urinary obstruction and retention over the last month.  Patient states that he has a follow-up appointment with urology at the end of the month. ROS: Patient currently denies any vision changes, tinnitus, difficulty speaking, facial droop, sore throat, chest pain, shortness of breath, abdominal pain, nausea/vomiting/diarrhea, dysuria, or weakness/numbness/paresthesias in any extremity   Physical Exam  Triage Vital Signs: ED Triage Vitals  Encounter Vitals Group     BP 10/05/23 1123 (!) 145/83     Girls Systolic BP Percentile --      Girls Diastolic BP Percentile --      Boys Systolic BP Percentile --      Boys Diastolic BP Percentile --      Pulse Rate 10/05/23 1123 84     Resp 10/05/23 1123 16     Temp 10/05/23 1123 98.4 F (36.9 C)     Temp Source 10/05/23 1123 Oral     SpO2 10/05/23 1123 100 %     Weight 10/05/23 1124 174 lb (78.9 kg)     Height 10/05/23 1124 5' 5 (1.651 m)     Head Circumference --      Peak Flow --      Pain Score 10/05/23 1121 6     Pain Loc --      Pain Education --      Exclude from Growth Chart --    Most recent vital signs: Vitals:   10/05/23 1123  BP: (!) 145/83  Pulse: 84  Resp: 16  Temp: 98.4 F (36.9 C)  SpO2: 100%   General: Awake, oriented x4. CV:  Good peripheral perfusion. Resp:  Normal effort. Abd:  No distention. Other:  Elderly overweight Caucasian male resting comfortably in no acute distress ED Results / Procedures / Treatments  Labs (all labs ordered are listed, but only abnormal  results are displayed) Labs Reviewed - No data to display PROCEDURES: Critical Care performed: No Procedures MEDICATIONS ORDERED IN ED: Medications  amoxicillin -clavulanate (AUGMENTIN ) 875-125 MG per tablet 1 tablet (has no administration in time range)   IMPRESSION / MDM / ASSESSMENT AND PLAN / ED COURSE  I reviewed the triage vital signs and the nursing notes.                             The patient is on the cardiac monitor to evaluate for evidence of arrhythmia and/or significant heart rate changes. Patient's presentation is most consistent with acute presentation with potential threat to life or bodily function. Patient is an 88 year old male with the above-stated past medical history that presents for Foley catheter malfunction after it came out overnight.  Foley catheter was replaced without incident using a 20-gauge Foley catheter with good urinary return.  Patient denies any dysuria, suprapubic abdominal pain, fever, or flank pain concerning for urinary tract infection at this time.  Patient states that he has follow-up in 10 days with his urologist.  Patient agrees with plan for discharge at this time with urology follow-up.  All questions were answered prior to discharge and strict return precautions given  Dispo: Discharge home with urology follow-up   FINAL CLINICAL IMPRESSION(S) / ED DIAGNOSES   Final diagnoses:  Malfunction of Foley catheter, initial encounter (HCC)  Encounter for Foley catheter replacement   Rx / DC Orders   ED Discharge Orders     None      Note:  This document was prepared using Dragon voice recognition software and may include unintentional dictation errors.   Jossie Artist POUR, MD 10/05/23 [phone]

## 2023-10-05 NOTE — ED Notes (Signed)
 This EDT helped RN Olam insert foley catheter. Foley was successful, pt was changed into new briefs per request. Pt is currently in room resting, call light within reach.

## 2023-10-05 NOTE — Discharge Instructions (Signed)
 Foley catheter has been replaced as requested.  34 [place] has been placed at this time.  Follow-up with your primary provider or urologist as needed.

## 2023-10-05 NOTE — ED Triage Notes (Signed)
 Pt to ED for foley replacement. Size 18. Foley was replaced yesterday and fell out last night. Pt thinks the balloon was not inflated. Pt is alert and oriented with no complaints.

## 2023-10-15 ENCOUNTER — Other Ambulatory Visit: Payer: Self-pay

## 2023-10-15 ENCOUNTER — Ambulatory Visit: Admitting: Urology

## 2023-10-15 ENCOUNTER — Other Ambulatory Visit
Admission: RE | Admit: 2023-10-15 | Discharge: 2023-10-15 | Disposition: A | Discharge status: Home / Self Care | Attending: Urology | Admitting: Urology

## 2023-10-15 VITALS — BP 121/72 | HR 79 | Wt 189.0 lb

## 2023-10-15 DIAGNOSIS — N401 Enlarged prostate with lower urinary tract symptoms: Secondary | ICD-10-CM | POA: Diagnosis not present

## 2023-10-15 DIAGNOSIS — N138 Other obstructive and reflux uropathy: Secondary | ICD-10-CM

## 2023-10-15 DIAGNOSIS — R339 Retention of urine, unspecified: Secondary | ICD-10-CM

## 2023-10-15 NOTE — Progress Notes (Unsigned)
 10/15/23 3:31 PM   Norleen JONETTA Hope 1935-12-20 [id number]  CC: Incomplete bladder emptying, urinary retention, chronic Foley  HPI: Comorbid 88 year old male who resides in a living facility and has had a chronic Foley catheter since at least 2023.  History very challenging to obtain an extensive chart review performed.  He was previously followed by [facility] by Dr. Sherrilee and Dr. Rosalind.  Urodynamics reportedly showed very poor detrusor function, and incomplete emptying with PVR .  It sounds like he was started on alpha-blocker and finasteride .  He has a distant history of a microwave prostate procedure.  Also previously saw Dr. Consuela for OAB symptoms.  He has done well with the chronic Foley and would like to continue with the catheter.  He has had some problems with leakage at the 16 [place] Foley size, but this resolved by increasing to a 20 [place] Foley.   PMH: Past Medical History:  Diagnosis Date   Abdominal pain    other specified site   Abnormal gait    Advance directive in chart 10/12/2015   Anemia    Arthritis    BPH (benign prostatic hyperplasia)    Cataract    Cervical spondylosis without myelopathy    Cognitive dysfunction    DDD (degenerative disc disease), cervical 06/29/2013   Degeneration of cervical intervertebral disc    Degeneration of lumbar intervertebral disc    Depression    Excessive daytime sleepiness    Generalized osteoarthritis of multiple sites 11/19/2013   GERD (gastroesophageal reflux disease)    Hallux varus, acquired    Hiatal hernia    Hyperlipidemia    Hypertension    (12/30/19 - pt denies)   Hypogonadism in male    Impaired functional mobility, balance, gait, and endurance 11/14/2015   uses motorized wheelchair   Insomnia 06/29/2013   Low back pain    Lumbosacral spondylosis without myelopathy    Memory loss    Mild cognitive impairment    Neurogenic bladder    OP (osteoporosis)    Organic sleep apnea    Osteoporosis  03/15/2004   Peripheral neuropathy    Radial nerve palsy    Scoliosis 06/29/2013   Seizure (HCC) 07/31/2016   Sleep apnea    CPAP to bring DOS sleep study done in [place]   Spinal compression fracture (HCC)    Spinal cord stimulator status    No longer functions, battery died   Spinal stenosis    Spondylolisthesis, acquired    Urinary incontinence, stress, male 08/10/2015   Urinary urgency    Uses hearing aid 10/12/2015   both ears   Uses wheelchair    Venous stasis    Venous stasis dermatitis of both lower extremities 06/29/2013   Vision abnormalities    Vitamin B12 deficiency 09/27/2016   Wrist drop, bilateral     Surgical History: Past Surgical History:  Procedure Laterality Date   CARPAL TUNNEL RELEASE Left 03/16/2016   Dr. Ozell Flake   CATARACT EXTRACTION W/PHACO Right 01/04/2020   Procedure: CATARACT EXTRACTION PHACO AND INTRAOCULAR LENS PLACEMENT (IOC) RIGHT;  Surgeon: Myrna Adine Anes, MD;  Location: [facility];  Service: Ophthalmology;  Laterality: Right;  4.77 0:34.4   CATARACT EXTRACTION W/PHACO Left 04/02/2022   Procedure: CATARACT EXTRACTION PHACO AND INTRAOCULAR LENS PLACEMENT (IOC) LEFT  5.20  00:35.8;  Surgeon: Myrna Adine Anes, MD;  Location: [facility];  Service: Ophthalmology;  Laterality: Left;  sleep apnea   CERVICAL SPINE SURGERY  CORNEAL EYE SURGERY Right 09/08/2014   PTK (Dr. Charlanne)   HEMORRHOID SURGERY     HERNIA REPAIR     left side   INTRATHECAL PUMP IMPLANTATION  04/28/2014   Procedure: REVISION INTRATHECAL CATH; Surgeon: Charlie Richardean Dryer, MD; Location: [facility]; Service: Anes/ Pain Mgmt; Laterality: N/A;   JOINT REPLACEMENT Bilateral    bilateral knee replacements   LUMBAR SPINE SURGERY     PAIN PUMP IMPLANTATION     multiple procedures for removal and insertion this is his 3rd pain pump   PAIN PUMP REMOVAL     multiple procedures for removal and insertion this is his 3rd pain pump   Proleive System  Treatment  2006   ROTATOR CUFF REPAIR Right    x2   SPINAL CORD STIMULATOR IMPLANT     x2   THORACIC SPINE SURGERY     TONSILLECTOMY     Family History: Family History  Problem Relation Age of Onset   Stroke Father    Alcohol abuse Father    Diabetes type II Father    Heart disease Father        pacemaker   Diabetes type II Brother    Heart disease Brother        pacemaker   Stroke Mother    COPD Mother    Alcohol abuse Brother    Arthritis Sister    Glaucoma Paternal Uncle    Sleep apnea Son    Kidney disease Neg Hx    Prostate cancer Neg Hx    Macular degeneration Neg Hx     Social History:  reports that he has never smoked. He has never used smokeless tobacco. He reports that he does not drink alcohol and does not use drugs.  Physical Exam: BP 121/72 (BP Location: Left Arm, Patient Position: Sitting, Cuff Size: Normal)   Pulse 79   Wt 189 lb (85.7 kg)   SpO2 96%   BMI 31.45 kg/m    Constitutional: Frail-appearing, in wheelchair Cardiovascular: No clubbing, cyanosis, or edema. Respiratory: Normal respiratory effort, no increased work of breathing. GI: Abdomen is soft, nontender, nondistended, no abdominal masses   Laboratory Data: Reviewed  Pertinent Imaging: I have personally viewed and interpreted the CT scan from June 2023, prostate measures 12g, Foley in place.  Assessment & Plan:   88 year old male with a number of comorbidities and chronic pain, incomplete bladder emptying with poor detrusor function on urodynamics, bladder managed with chronic Foley for at least the last 3 years.  He has done well with the chronic catheter and has not had UTIs or other problems.  We discussed concept of outlet procedures like HOLEP with potential to resume spontaneous voiding, but higher risk with his age, and high risk of incontinence.  He is minimally bothered by the catheter and would like to continue chronic Foley changes with home health.  Okay to use a 20 [place]  Foley as this has improved his leakage around the catheter, could consider changing to a suprapubic tube in the future if worsening problems.  Okay for home health to change Foley monthly, 20 French Foley RTC 1 year  I spent 50 total minutes on the day of the encounter including pre-visit review of the medical record, face-to-face time with the patient, and post visit ordering of labs/imaging/tests.  Extensive review of outside medical records.  Redell Burnet, MD 10/15/2023  [facility] [address], [address] [place], [place] [address] [phone]

## 2023-10-28 ENCOUNTER — Telehealth: Payer: Self-pay | Admitting: Urology

## 2023-10-28 DIAGNOSIS — R339 Retention of urine, unspecified: Secondary | ICD-10-CM

## 2023-10-28 NOTE — Telephone Encounter (Signed)
 The patient called and stated that he was supposed to be faxed a 20 Fr catheter and a 30 cc balloon. Pt would like a call back with clarification

## 2023-10-29 MED ORDER — UNABLE TO FIND: 11 refills | Status: AC

## 2023-10-29 NOTE — Telephone Encounter (Signed)
 Order was previously faxed and a copy given to patent however will fax again, see order under unable to find in meds.

## 2024-10-19 ENCOUNTER — Ambulatory Visit: Admitting: Urology
